# Patient Record
Sex: Female | Born: 1950
Health system: Southern US, Community
[De-identification: ages and names within clinical notes are randomized; demographics above are authoritative.]

## PROBLEM LIST (undated history)

## (undated) DIAGNOSIS — D649 Anemia, unspecified: Secondary | ICD-10-CM

## (undated) DIAGNOSIS — F329 Major depressive disorder, single episode, unspecified: Secondary | ICD-10-CM

## (undated) DIAGNOSIS — F32A Depression, unspecified: Secondary | ICD-10-CM

## (undated) DIAGNOSIS — F419 Anxiety disorder, unspecified: Secondary | ICD-10-CM

## (undated) DIAGNOSIS — Z9981 Dependence on supplemental oxygen: Secondary | ICD-10-CM

## (undated) DIAGNOSIS — K2270 Barrett's esophagus without dysplasia: Secondary | ICD-10-CM

## (undated) DIAGNOSIS — J939 Pneumothorax, unspecified: Secondary | ICD-10-CM

## (undated) DIAGNOSIS — K449 Diaphragmatic hernia without obstruction or gangrene: Secondary | ICD-10-CM

## (undated) DIAGNOSIS — K589 Irritable bowel syndrome without diarrhea: Secondary | ICD-10-CM

## (undated) DIAGNOSIS — Z972 Presence of dental prosthetic device (complete) (partial): Secondary | ICD-10-CM

## (undated) DIAGNOSIS — G43909 Migraine, unspecified, not intractable, without status migrainosus: Secondary | ICD-10-CM

## (undated) DIAGNOSIS — D860 Sarcoidosis of lung: Secondary | ICD-10-CM

## (undated) DIAGNOSIS — K219 Gastro-esophageal reflux disease without esophagitis: Secondary | ICD-10-CM

## (undated) HISTORY — DX: Irritable bowel syndrome, unspecified: K58.9

## (undated) HISTORY — PX: CATARACT EXTRACTION, BILATERAL: SHX1313

## (undated) HISTORY — PX: ROTATOR CUFF REPAIR: SHX139

## (undated) HISTORY — DX: Sarcoidosis of lung: D86.0

## (undated) HISTORY — DX: Diaphragmatic hernia without obstruction or gangrene: K44.9

## (undated) HISTORY — PX: CHOLECYSTECTOMY: SHX55

## (undated) HISTORY — DX: Anxiety disorder, unspecified: F41.9

## (undated) HISTORY — PX: APPENDECTOMY: SHX54

## (undated) HISTORY — PX: NISSEN FUNDOPLICATION: SHX2091

## (undated) HISTORY — DX: Migraine, unspecified, not intractable, without status migrainosus: G43.909

## (undated) HISTORY — DX: Gastro-esophageal reflux disease without esophagitis: K21.9

## (undated) HISTORY — DX: Barrett's esophagus without dysplasia: K22.70

---

## 1994-09-08 HISTORY — PX: BRONCHIAL BIOPSY: SHX5109

## 2000-04-30 ENCOUNTER — Other Ambulatory Visit: Admission: RE | Admit: 2000-04-30 | Discharge: 2000-04-30 | Payer: Self-pay | Admitting: Gynecology

## 2003-02-27 ENCOUNTER — Ambulatory Visit (HOSPITAL_COMMUNITY): Admission: RE | Admit: 2003-02-27 | Discharge: 2003-02-27 | Payer: Self-pay | Admitting: Obstetrics

## 2003-02-27 ENCOUNTER — Encounter: Payer: Self-pay | Admitting: Obstetrics

## 2004-05-31 ENCOUNTER — Ambulatory Visit (HOSPITAL_COMMUNITY): Admission: RE | Admit: 2004-05-31 | Discharge: 2004-05-31 | Payer: Self-pay | Admitting: Internal Medicine

## 2004-10-29 ENCOUNTER — Ambulatory Visit: Payer: Self-pay | Admitting: Internal Medicine

## 2004-11-06 ENCOUNTER — Ambulatory Visit: Payer: Self-pay | Admitting: Internal Medicine

## 2004-11-07 ENCOUNTER — Ambulatory Visit (HOSPITAL_COMMUNITY): Admission: RE | Admit: 2004-11-07 | Discharge: 2004-11-07 | Payer: Self-pay | Admitting: Obstetrics

## 2004-11-20 ENCOUNTER — Ambulatory Visit: Payer: Self-pay | Admitting: Internal Medicine

## 2004-11-29 ENCOUNTER — Ambulatory Visit: Payer: Self-pay | Admitting: Internal Medicine

## 2005-01-28 ENCOUNTER — Ambulatory Visit: Payer: Self-pay | Admitting: Internal Medicine

## 2005-02-05 ENCOUNTER — Ambulatory Visit: Payer: Self-pay | Admitting: Internal Medicine

## 2005-02-05 ENCOUNTER — Ambulatory Visit (HOSPITAL_COMMUNITY): Admission: RE | Admit: 2005-02-05 | Discharge: 2005-02-05 | Payer: Self-pay | Admitting: Internal Medicine

## 2005-02-10 ENCOUNTER — Ambulatory Visit (HOSPITAL_COMMUNITY): Admission: RE | Admit: 2005-02-10 | Discharge: 2005-02-10 | Payer: Self-pay | Admitting: Internal Medicine

## 2005-02-10 ENCOUNTER — Encounter: Payer: Self-pay | Admitting: Internal Medicine

## 2005-02-17 ENCOUNTER — Ambulatory Visit: Payer: Self-pay | Admitting: Internal Medicine

## 2005-04-30 ENCOUNTER — Encounter (INDEPENDENT_AMBULATORY_CARE_PROVIDER_SITE_OTHER): Payer: Self-pay | Admitting: *Deleted

## 2005-05-01 ENCOUNTER — Inpatient Hospital Stay (HOSPITAL_COMMUNITY): Admission: RE | Admit: 2005-05-01 | Discharge: 2005-05-03 | Payer: Self-pay | Admitting: Surgery

## 2005-05-01 ENCOUNTER — Encounter (INDEPENDENT_AMBULATORY_CARE_PROVIDER_SITE_OTHER): Payer: Self-pay | Admitting: *Deleted

## 2005-06-04 ENCOUNTER — Ambulatory Visit: Payer: Self-pay | Admitting: Internal Medicine

## 2005-06-15 ENCOUNTER — Encounter: Admission: RE | Admit: 2005-06-15 | Discharge: 2005-06-15 | Payer: Self-pay | Admitting: Internal Medicine

## 2005-07-15 ENCOUNTER — Ambulatory Visit (HOSPITAL_COMMUNITY): Admission: RE | Admit: 2005-07-15 | Discharge: 2005-07-16 | Payer: Self-pay | Admitting: Orthopedic Surgery

## 2005-11-18 ENCOUNTER — Ambulatory Visit: Payer: Self-pay | Admitting: Internal Medicine

## 2006-07-08 ENCOUNTER — Ambulatory Visit: Payer: Self-pay | Admitting: Internal Medicine

## 2006-08-04 ENCOUNTER — Ambulatory Visit: Payer: Self-pay | Admitting: Internal Medicine

## 2007-05-03 ENCOUNTER — Ambulatory Visit: Payer: Self-pay | Admitting: Internal Medicine

## 2007-05-04 ENCOUNTER — Encounter: Payer: Self-pay | Admitting: Internal Medicine

## 2007-05-04 LAB — CONVERTED CEMR LAB: Angiotensin 1 Converting Enzyme: 95 units/L — ABNORMAL HIGH (ref 9–67)

## 2007-08-11 ENCOUNTER — Ambulatory Visit: Payer: Self-pay | Admitting: Internal Medicine

## 2007-08-12 ENCOUNTER — Encounter: Payer: Self-pay | Admitting: Internal Medicine

## 2007-08-12 DIAGNOSIS — J4530 Mild persistent asthma, uncomplicated: Secondary | ICD-10-CM | POA: Insufficient documentation

## 2007-08-12 DIAGNOSIS — D869 Sarcoidosis, unspecified: Secondary | ICD-10-CM | POA: Insufficient documentation

## 2007-08-12 DIAGNOSIS — G43909 Migraine, unspecified, not intractable, without status migrainosus: Secondary | ICD-10-CM | POA: Insufficient documentation

## 2007-08-12 DIAGNOSIS — K449 Diaphragmatic hernia without obstruction or gangrene: Secondary | ICD-10-CM | POA: Insufficient documentation

## 2007-09-27 ENCOUNTER — Telehealth (INDEPENDENT_AMBULATORY_CARE_PROVIDER_SITE_OTHER): Payer: Self-pay | Admitting: *Deleted

## 2008-01-07 ENCOUNTER — Telehealth: Payer: Self-pay | Admitting: Internal Medicine

## 2008-01-10 ENCOUNTER — Encounter: Payer: Self-pay | Admitting: Internal Medicine

## 2008-01-10 ENCOUNTER — Ambulatory Visit (HOSPITAL_COMMUNITY): Admission: RE | Admit: 2008-01-10 | Discharge: 2008-01-10 | Payer: Self-pay | Admitting: Internal Medicine

## 2008-01-27 ENCOUNTER — Ambulatory Visit: Payer: Self-pay | Admitting: Internal Medicine

## 2008-08-10 ENCOUNTER — Ambulatory Visit: Payer: Self-pay | Admitting: Internal Medicine

## 2008-08-29 ENCOUNTER — Ambulatory Visit (HOSPITAL_COMMUNITY): Admission: RE | Admit: 2008-08-29 | Discharge: 2008-08-29 | Payer: Self-pay | Admitting: Obstetrics

## 2008-08-29 ENCOUNTER — Ambulatory Visit: Payer: Self-pay | Admitting: Internal Medicine

## 2008-08-29 DIAGNOSIS — Z86010 Personal history of colonic polyps: Secondary | ICD-10-CM | POA: Insufficient documentation

## 2008-08-29 DIAGNOSIS — F411 Generalized anxiety disorder: Secondary | ICD-10-CM | POA: Insufficient documentation

## 2008-08-30 ENCOUNTER — Telehealth (INDEPENDENT_AMBULATORY_CARE_PROVIDER_SITE_OTHER): Payer: Self-pay | Admitting: *Deleted

## 2008-09-13 ENCOUNTER — Telehealth (INDEPENDENT_AMBULATORY_CARE_PROVIDER_SITE_OTHER): Payer: Self-pay | Admitting: *Deleted

## 2008-09-14 ENCOUNTER — Ambulatory Visit: Payer: Self-pay | Admitting: Internal Medicine

## 2008-09-14 ENCOUNTER — Encounter (INDEPENDENT_AMBULATORY_CARE_PROVIDER_SITE_OTHER): Payer: Self-pay | Admitting: *Deleted

## 2008-09-14 LAB — CONVERTED CEMR LAB
OCCULT 1: NEGATIVE
OCCULT 2: NEGATIVE
OCCULT 3: NEGATIVE

## 2008-10-20 ENCOUNTER — Telehealth (INDEPENDENT_AMBULATORY_CARE_PROVIDER_SITE_OTHER): Payer: Self-pay | Admitting: *Deleted

## 2008-10-30 ENCOUNTER — Telehealth (INDEPENDENT_AMBULATORY_CARE_PROVIDER_SITE_OTHER): Payer: Self-pay | Admitting: *Deleted

## 2008-11-21 ENCOUNTER — Ambulatory Visit (HOSPITAL_BASED_OUTPATIENT_CLINIC_OR_DEPARTMENT_OTHER): Admission: RE | Admit: 2008-11-21 | Discharge: 2008-11-22 | Payer: Self-pay | Admitting: Orthopedic Surgery

## 2008-11-28 ENCOUNTER — Emergency Department (HOSPITAL_COMMUNITY): Admission: EM | Admit: 2008-11-28 | Discharge: 2008-11-28 | Payer: Self-pay | Admitting: Emergency Medicine

## 2009-04-04 ENCOUNTER — Encounter: Payer: Self-pay | Admitting: Internal Medicine

## 2009-06-25 ENCOUNTER — Encounter: Payer: Self-pay | Admitting: Internal Medicine

## 2009-07-11 ENCOUNTER — Telehealth (INDEPENDENT_AMBULATORY_CARE_PROVIDER_SITE_OTHER): Payer: Self-pay | Admitting: *Deleted

## 2009-07-13 ENCOUNTER — Encounter (INDEPENDENT_AMBULATORY_CARE_PROVIDER_SITE_OTHER): Payer: Self-pay | Admitting: *Deleted

## 2009-07-13 ENCOUNTER — Ambulatory Visit: Payer: Self-pay | Admitting: Internal Medicine

## 2009-08-10 ENCOUNTER — Encounter: Admission: RE | Admit: 2009-08-10 | Discharge: 2009-08-10 | Payer: Self-pay | Admitting: Orthopedic Surgery

## 2009-11-02 ENCOUNTER — Ambulatory Visit: Payer: Self-pay | Admitting: Internal Medicine

## 2009-11-02 DIAGNOSIS — R05 Cough: Secondary | ICD-10-CM | POA: Insufficient documentation

## 2009-11-02 DIAGNOSIS — R058 Other specified cough: Secondary | ICD-10-CM | POA: Insufficient documentation

## 2009-12-14 ENCOUNTER — Encounter: Payer: Self-pay | Admitting: Internal Medicine

## 2009-12-14 ENCOUNTER — Ambulatory Visit: Payer: Self-pay | Admitting: Internal Medicine

## 2009-12-14 ENCOUNTER — Ambulatory Visit: Payer: Self-pay | Admitting: Cardiology

## 2009-12-19 ENCOUNTER — Encounter (INDEPENDENT_AMBULATORY_CARE_PROVIDER_SITE_OTHER): Payer: Self-pay | Admitting: *Deleted

## 2009-12-26 ENCOUNTER — Encounter (INDEPENDENT_AMBULATORY_CARE_PROVIDER_SITE_OTHER): Payer: Self-pay | Admitting: *Deleted

## 2010-02-12 DIAGNOSIS — K2270 Barrett's esophagus without dysplasia: Secondary | ICD-10-CM | POA: Insufficient documentation

## 2010-02-18 ENCOUNTER — Ambulatory Visit: Payer: Self-pay | Admitting: Internal Medicine

## 2010-02-18 DIAGNOSIS — K219 Gastro-esophageal reflux disease without esophagitis: Secondary | ICD-10-CM | POA: Insufficient documentation

## 2010-03-13 ENCOUNTER — Ambulatory Visit: Payer: Self-pay | Admitting: Internal Medicine

## 2010-03-15 ENCOUNTER — Encounter: Payer: Self-pay | Admitting: Internal Medicine

## 2010-03-21 ENCOUNTER — Ambulatory Visit (HOSPITAL_COMMUNITY): Admission: RE | Admit: 2010-03-21 | Discharge: 2010-03-21 | Payer: Self-pay | Admitting: Orthopedic Surgery

## 2010-03-26 ENCOUNTER — Ambulatory Visit (HOSPITAL_BASED_OUTPATIENT_CLINIC_OR_DEPARTMENT_OTHER): Admission: RE | Admit: 2010-03-26 | Discharge: 2010-03-27 | Payer: Self-pay | Admitting: Orthopedic Surgery

## 2010-04-30 ENCOUNTER — Ambulatory Visit: Payer: Self-pay | Admitting: Internal Medicine

## 2010-05-30 ENCOUNTER — Encounter: Payer: Self-pay | Admitting: Internal Medicine

## 2010-06-18 ENCOUNTER — Telehealth: Payer: Self-pay | Admitting: Internal Medicine

## 2010-06-18 ENCOUNTER — Telehealth (INDEPENDENT_AMBULATORY_CARE_PROVIDER_SITE_OTHER): Payer: Self-pay | Admitting: *Deleted

## 2010-06-20 ENCOUNTER — Ambulatory Visit: Payer: Self-pay | Admitting: Internal Medicine

## 2010-06-20 ENCOUNTER — Telehealth (INDEPENDENT_AMBULATORY_CARE_PROVIDER_SITE_OTHER): Payer: Self-pay | Admitting: *Deleted

## 2010-09-05 ENCOUNTER — Ambulatory Visit: Payer: Self-pay | Admitting: Internal Medicine

## 2010-09-05 ENCOUNTER — Ambulatory Visit (HOSPITAL_COMMUNITY)
Admission: RE | Admit: 2010-09-05 | Discharge: 2010-09-05 | Payer: Self-pay | Source: Home / Self Care | Attending: Obstetrics | Admitting: Obstetrics

## 2010-09-05 DIAGNOSIS — J309 Allergic rhinitis, unspecified: Secondary | ICD-10-CM | POA: Insufficient documentation

## 2010-09-05 DIAGNOSIS — K589 Irritable bowel syndrome without diarrhea: Secondary | ICD-10-CM | POA: Insufficient documentation

## 2010-09-10 LAB — CONVERTED CEMR LAB
ALT: 21 units/L (ref 0–35)
AST: 11 units/L (ref 0–37)
Albumin: 4 g/dL (ref 3.5–5.2)
Alkaline Phosphatase: 64 units/L (ref 39–117)
BUN: 12 mg/dL (ref 6–23)
Basophils Absolute: 0 10*3/uL (ref 0.0–0.1)
Basophils Relative: 0.5 % (ref 0.0–3.0)
Bilirubin, Direct: 0 mg/dL (ref 0.0–0.3)
CO2: 29 meq/L (ref 19–32)
Calcium: 9.4 mg/dL (ref 8.4–10.5)
Chloride: 105 meq/L (ref 96–112)
Cholesterol: 166 mg/dL (ref 0–200)
Creatinine, Ser: 1 mg/dL (ref 0.4–1.2)
Eosinophils Absolute: 0.3 10*3/uL (ref 0.0–0.7)
Eosinophils Relative: 4.8 % (ref 0.0–5.0)
GFR calc non Af Amer: 70.63 mL/min (ref >60.00)
Glucose, Bld: 85 mg/dL (ref 70–99)
HCT: 37.9 % (ref 36.0–46.0)
HDL: 54 mg/dL (ref >39.00)
Hemoglobin: 12.6 g/dL (ref 12.0–15.0)
LDL Cholesterol: 93 mg/dL (ref 0–99)
Lymphocytes Relative: 37.7 % (ref 12.0–46.0)
Lymphs Abs: 2.1 10*3/uL (ref 0.7–4.0)
MCHC: 33.3 g/dL (ref 30.0–36.0)
MCV: 91.5 fL (ref 78.0–100.0)
Monocytes Absolute: 1 10*3/uL (ref 0.1–1.0)
Monocytes Relative: 18 % — ABNORMAL HIGH (ref 3.0–12.0)
Neutro Abs: 2.1 10*3/uL (ref 1.4–7.7)
Neutrophils Relative %: 39 % — ABNORMAL LOW (ref 43.0–77.0)
Platelets: 209 10*3/uL (ref 150.0–400.0)
Potassium: 4.1 meq/L (ref 3.5–5.1)
RBC: 4.14 M/uL (ref 3.87–5.11)
RDW: 13.4 % (ref 11.5–14.6)
Sodium: 141 meq/L (ref 135–145)
TSH: 1.83 microintl units/mL (ref 0.35–5.50)
Total Bilirubin: 0.6 mg/dL (ref 0.3–1.2)
Total CHOL/HDL Ratio: 3
Total Protein: 7.5 g/dL (ref 6.0–8.3)
Triglycerides: 94 mg/dL (ref 0.0–149.0)
VLDL: 18.8 mg/dL (ref 0.0–40.0)
WBC: 5.4 10*3/uL (ref 4.5–10.5)

## 2010-10-06 LAB — CONVERTED CEMR LAB
ALT: 43 units/L — ABNORMAL HIGH (ref 0–35)
AST: 48 units/L — ABNORMAL HIGH (ref 0–37)
Albumin: 4 g/dL (ref 3.5–5.2)
Alkaline Phosphatase: 64 units/L (ref 39–117)
BUN: 10 mg/dL (ref 6–23)
Basophils Absolute: 0 10*3/uL (ref 0.0–0.1)
Basophils Relative: 0.8 % (ref 0.0–3.0)
Bilirubin, Direct: 0.1 mg/dL (ref 0.0–0.3)
CO2: 31 meq/L (ref 19–32)
Calcium: 9.9 mg/dL (ref 8.4–10.5)
Chloride: 102 meq/L (ref 96–112)
Cholesterol: 166 mg/dL (ref 0–200)
Creatinine, Ser: 1 mg/dL (ref 0.4–1.2)
Eosinophils Absolute: 0.2 10*3/uL (ref 0.0–0.7)
Eosinophils Relative: 3.2 % (ref 0.0–5.0)
GFR calc Af Amer: 73 mL/min
GFR calc non Af Amer: 60 mL/min
Glucose, Bld: 93 mg/dL (ref 70–99)
HCT: 39.1 % (ref 36.0–46.0)
HDL: 60.7 mg/dL (ref >39.0)
Hemoglobin: 13.5 g/dL (ref 12.0–15.0)
LDL Cholesterol: 93 mg/dL (ref 0–99)
Lymphocytes Relative: 32.3 % (ref 12.0–46.0)
MCHC: 34.5 g/dL (ref 30.0–36.0)
MCV: 92 fL (ref 78.0–100.0)
Monocytes Absolute: 0.8 10*3/uL (ref 0.1–1.0)
Monocytes Relative: 16.1 % — ABNORMAL HIGH (ref 3.0–12.0)
Neutro Abs: 2.5 10*3/uL (ref 1.4–7.7)
Neutrophils Relative %: 47.6 % (ref 43.0–77.0)
Platelets: 218 10*3/uL (ref 150–400)
Potassium: 3.8 meq/L (ref 3.5–5.1)
RBC: 4.25 M/uL (ref 3.87–5.11)
RDW: 12.9 % (ref 11.5–14.6)
Sodium: 141 meq/L (ref 135–145)
TSH: 1.76 microintl units/mL (ref 0.35–5.50)
Total Bilirubin: 0.5 mg/dL (ref 0.3–1.2)
Total CHOL/HDL Ratio: 2.7
Total Protein: 8.3 g/dL (ref 6.0–8.3)
Triglycerides: 62 mg/dL (ref 0–149)
VLDL: 12 mg/dL (ref 0–40)
WBC: 5.1 10*3/uL (ref 4.5–10.5)

## 2010-10-08 NOTE — Letter (Signed)
Summary: EGD Instructions  [facility]  [address] [place], [place] [address]   Phone: [phone]  Fax: [phone]       [name]    May 06, 1951    MRN: [id number]       Procedure Day Dorna Bloom: Wednesday 03/13/10     Arrival Time: 1:30 pm     Procedure Time: 2:30 pm     Location of Procedure:                    _ x _ [facility] (4th Floor)  PREPARATION FOR ENDOSCOPY   On 03/13/10 THE DAY OF THE PROCEDURE:  1.   No solid foods, milk or milk products are allowed after midnight the night before your procedure.  2.   Do not drink anything colored red or purple.  Avoid juices with pulp.  No orange juice.  3.  You may drink clear liquids until 12:30 pm, which is 2 hours before your procedure.                                                                                                CLEAR LIQUIDS INCLUDE: Water Jello Ice Popsicles Tea (sugar ok, no milk/cream) Powdered fruit flavored drinks Coffee (sugar ok, no milk/cream) Gatorade Juice: apple, white grape, white cranberry  Lemonade Clear bullion, consomm, broth Carbonated beverages (any kind) Strained chicken noodle soup Hard Candy   MEDICATION INSTRUCTIONS  Unless otherwise instructed, you should take regular prescription medications with a small sip of water as early as possible the morning of your procedure.                  OTHER INSTRUCTIONS  You will need a responsible adult at least 62 years of age to accompany you and drive you home.   This person must remain in the waiting room during your procedure.  Wear loose fitting clothing that is easily removed.  Leave jewelry and other valuables at home.  However, you may wish to bring a book to read or an iPod/MP3 player to listen to music as you wait for your procedure to start.  Remove all body piercing jewelry and leave at home.  Total time from sign-in until discharge is approximately 2-3 hours.  You should  go home directly after your procedure and rest.  You can resume normal activities the day after your procedure.  The day of your procedure you should not:   Drive   Make legal decisions   Operate machinery   Drink alcohol   Return to work  You will receive specific instructions about eating, activities and medications before you leave.    The above instructions have been reviewed and explained to me by   Lamona Curl CMA Duncan Dull)  February 18, 2010 2:09 PM     I fully understand and can verbalize these instructions _____________________________ Date 02/18/10

## 2010-10-08 NOTE — Progress Notes (Signed)
Summary: stomach ache  Phone Note Call from Patient Call back at Home Phone [phone] Call back at [phone]   Caller: Patient Call For: wert Reason for Call: Talk to Nurse Summary of Call: Pt states it hurts around her stomach area when she coughs, feels like something is broken, would like to come in for an xray, pls advise. Initial call taken by: Darletta Moll,  June 18, 2010 8:38 AM  Follow-up for Phone Call        called and spoke with pt.  pt states she has noticed stomach pains "up under her breast" since Saturday.  Pt states she only notices the pain when she coughs.  Pt states she also had a fever on Saturday night.  I offered pt an appt with TP out in [facility] this morning.  ([place]  Pt stated she wanted to "think about it" and will call us back if she would like to schedule ov.  will await for pt to call back.  Aundra Millet Reynolds LPN  June 18, 2010 9:10 AM

## 2010-10-08 NOTE — Letter (Signed)
Summary: New Patient letter  [facility]  [address] [place], [place] [address]   Phone: [phone]  Fax: [phone]       12/26/2009 MRN: [id number]  [name] [address] [place], [place]  [address]  Dear [name],  Welcome to the Gastroenterology Division at [facility].    You are scheduled to see Dr. Juanda Chance on 02-18-10 at 1:30p.m. on the 3rd floor at [facility], [address]  We ask that you try to arrive at our office 15 minutes prior to your appointment time to allow for check-in.  We would like you to complete the enclosed self-administered evaluation form prior to your visit and bring it with you on the day of your appointment.  We will review it with you.  Also, please bring a complete list of all your medications or, if you prefer, bring the medication bottles and we will list them.  Please bring your insurance card so that we may make a copy of it.  If your insurance requires a referral to see a specialist, please bring your referral form from your primary care physician.  Co-payments are due at the time of your visit and may be paid by cash, check or credit card.     Your office visit will consist of a consult with your physician (includes a physical exam), any laboratory testing he/she may order, scheduling of any necessary diagnostic testing (e.g. x-ray, ultrasound, CT-scan), and scheduling of a procedure (e.g. Endoscopy, Colonoscopy) if required.  Please allow enough time on your schedule to allow for any/all of these possibilities.    If you cannot keep your appointment, please call [phone] to cancel or reschedule prior to your appointment date.  This allows [place] the opportunity to schedule an appointment for another patient in need of care.  If you do not cancel or reschedule by 5 p.m. the business day prior to your appointment date, you will be charged a $50.00 late cancellation/no-show fee.    Thank you for choosing  [facility] for your medical needs.  We appreciate the opportunity to care for you.  Please visit [place] at our website  to learn more about our practice.                     Sincerely,                                                             The Gastroenterology Division

## 2010-10-08 NOTE — Assessment & Plan Note (Signed)
Summary: pain under R breast/worse when coughing/mg   Visit Type:  NP visit Copy to:  na Primary Madiline Saffran/Referring Enriqueta Augusta:  Marga Melnick, MD   CC:  Pt c/o sharp right side pain to touch, when twisting at waist, sneezing, and coughing or with bowel movements .  History of Present Illness: 20 yobf quit smoking 1983 with sarcoidosis dx in 1995.  November 02, 2009  c/o productive cough   x 6 months with small amounts of yellow mucus, esp in am Temp range T-97.6 to 99.4 since 10/29/09, chills, chest congestion1)  We called in Prednisone x 6 days, doxy for 7 days and tramadol for excess cough despite advair and ventolin being added as new maint rx.  Avelox 400 mg one daily x 10 days keep taking delsym and mucinex stop advair  Prednisone  4 each am x 2days, 2x2days, 1x2days and stop  take nexium 30 min before fist  meal and pepcid 20 mg (otc) at bedtime plus diet measures as listed.   December 14, 2009 Followup with PFT's.  Pt states that her cough is some better since last seen.  She stats that in the am prod is prod with yellow sputum.  Today she c/o "sinus HA" on and off x several wks.  She states her breathing is the same- no better or worse. overall no worse off advair, no increase need for rescue.   April 30, 2010 hoarseness started  8/20  worsening sob on exertion - prod cough (yellow) - had rotator cuff surgery 03-26-10 coughing since then, did not activate previous plan to add pepcid at hs.    June 20, 2010--Presents for an acute office visit. Complains of Pt c/o sharp right side pain to touch, when twisting at waist, sneezing, coughing , deep breaths or with bowel movements, worse at night starting 5 days ago.. She is flying out to [place] in am for funeral of aunt. No cough, dyspnea, rash, abdominal pain, n/v/d. Denies  orthopnea, hemoptysis, fever, n/v/d, edema, headache.   Preventive Screening-Counseling & Management  Alcohol-Tobacco     Smoking Status: quit     Packs/Day:  2.0     Year Started: 1967     Year Quit: 1983  Medications Prior to Update: 1)  Nexium 40 Mg  Cpdr (Esomeprazole Magnesium) .... By Mouth Daily. Take One Half Hour Before Eating. 2)  Citalopram Hydrobromide 20 Mg Tabs (Citalopram Hydrobromide) .... Take 1 Tablet By Mouth Once A Day 3)  Pepcid 20 Mg Tabs (Famotidine) .... One At [facility] 4)  Fluticasone Propionate 50 Mcg/act Susp (Fluticasone Propionate) .... One Spray Each Nostril Daily 5)  Levsin/sl 0.125 Mg Subl (Hyoscyamine Sulfate) .... Dissolve 1 Tablet Under Tongue As Neede For Rectal Pain 6)  Ventolin Hfa 108 (90 Base) Mcg/act Aers (Albuterol Sulfate) .Marland Kitchen.. 1-2 Puffs Every 4-6 Hrs As Needed 7)  Delsym 30 Mg/58ml Lqcr (Dextromethorphan Polistirex) .... Once Daily As Needed 8)  Methocarbamol 500 Mg Tabs (Methocarbamol) .... Take One Every 6 Hrs As Needed Muscle Spasm 9)  Prednisone 10 Mg  Tabs (Prednisone) .... 4 Each Am X 2days, 2x2days, 1x2days and Stop 10)  Tramadol Hcl 50 Mg  Tabs (Tramadol Hcl) .... One To Two By Mouth Every 4-6 Hours  Current Medications (verified): 1)  Ventolin Hfa 108 (90 Base) Mcg/act Aers (Albuterol Sulfate) .Marland Kitchen.. 1-2 Puffs Every 4-6 Hrs As Needed 2)  Delsym 30 Mg/69ml Lqcr (Dextromethorphan Polistirex) .... Once Daily As Needed 3)  Fluticasone Propionate 50 Mcg/act Susp (Fluticasone Propionate) .Marland KitchenMarland KitchenMarland Kitchen  One Spray Each Nostril Daily As Needed 4)  Nexium 40 Mg  Cpdr (Esomeprazole Magnesium) .... By Mouth Daily. Take One Half Hour Before Eating. 5)  Pepcid 20 Mg Tabs (Famotidine) .... One At [facility] 6)  Levsin/sl 0.125 Mg Subl (Hyoscyamine Sulfate) .... Dissolve 1 Tablet Under Tongue As Neede For Rectal Pain 7)  Celebrex 200 Mg Caps (Celecoxib) .... Take 1 Tablet By Mouth Once A Day  Allergies (verified): 1)  ! Pcn 2)  ! Jonne Ply  Past History:  Past Medical History: Last updated: 04/30/2010 PULMONARY SARCOIDOSIS (ICD-135)      - dx [address] [place], last prednisone dep around 2005      - PFT's  December 14, 2009 FEV1 1.99 (86%) ratio 77 with no better after B2 and DLC0 44 > corrects to 83 HIATAL HERNIA (ICD-553.3)/GERD      - EGD pos for reflux injury by bx but neg Barretts 03/13/10 ASTHMA (ICD-493.90) MIGRAINES, HX OF (ICD-V13.8) ? Sinus HA    - Sinus CT neg December 14, 2009  DUD  Colonic polyps, hx of Polyclonal IgG elevation Health Maintenance.......................................................................Marland KitchenHopper     - Pneumovax  December 14, 2009   Past Surgical History: Last updated: 02/12/2010 CHOLECYSTECTOMY G2 P2 Colon polypectomy 2006, Dr Juanda Chance Rotator cuff repair Cataract extraction bilat Trans bronchial biopsy 1996 Nissen Fundoplication  Family History: Last updated: 02/18/2010 Patient adopted  neg resp dz or sarcoidosis  & aunt ?HTN;  uncle & aunt with DM No FH of Colon Cancer:  Social History: Last updated: 02/18/2010 Former Smoker.  Quit 1980.  Smoked for 20 years at 1 ppd. 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 No Pain Assessment Patient in pain? yes     Location: abdomen Intensity: 8 Type: sharp Onset of pain  Intermittent Comments Medications reviewed with patient Verified contact number and pharmacy with patient Zackery Barefoot CMA  June 20, 2010 2:11 PM    Physical Exam  Additional Exam:  amb bf nad with voice fatigue and mild pseudowheeze  resolves with purse lip maneuver  wt   184 August 10, 2008 > 192 November 02, 2009 > 191 December 14, 2009 > 203 April 30, 2010 >>203 June 20, 2010   HEENT: nl dentition, turbinates, and orophanx. Nl external ear canals without cough reflex Neck without JVD/Nodes/TM Lungs clear to A and P bilaterally without cough on insp or exp maneuvers, tender along right lateral/lower ribs no eccymois noted , no deformitty or rash noted.  RRR no s3 or murmur or increase in P2 Abd soft and benign with nl excursion in the supine position. No bruits or organomegaly Ext warm without calf tenderness, cyanosis clubbing or  edema Skin warm and dry without lesions     Impression & Recommendations:  Problem # 1:  RIB PAIN, RIGHT SIDED (ICD-786.50)  Pleurtic type pain. XRay recently w/ no acute changes.  will tx w/ NSAIDS and heat  Plan:  May use celebrex once daily for rib /joint pain.  Warm heat to ribs two times a day  Please contact office for sooner follow up if symptoms do not improve or worsen  Flu shot today.   Orders: Est. Patient Level III (04540)  Medications Added to Medication List This Visit: 1)  Celebrex 200 Mg Caps (Celecoxib) .... Take 1 tablet by mouth once a day 2)  Fluticasone Propionate 50 Mcg/act Susp (Fluticasone propionate) .... One spray each nostril daily as needed  Complete Medication List: 1)  Nexium 40 Mg Cpdr (Esomeprazole magnesium) .... By mouth daily. take one half hour before eating. 2)  Pepcid 20 Mg Tabs (Famotidine) .... One at beditme 3)  Celebrex 200 Mg Caps (Celecoxib) .... Take 1 tablet by mouth once a day 4)  Levsin/sl 0.125 Mg Subl (Hyoscyamine sulfate) .... Dissolve 1 tablet under tongue as neede for rectal pain 5)  Ventolin Hfa 108 (90 Base) Mcg/act Aers (Albuterol sulfate) .Marland Kitchen.. 1-2 puffs every 4-6 hrs as needed 6)  Delsym 30 Mg/52ml Lqcr (Dextromethorphan polistirex) .... Once daily as needed 7)  Fluticasone Propionate 50 Mcg/act Susp (Fluticasone  propionate) .... One spray each nostril daily as needed  Patient Instructions: 1)  May use celebrex once daily for rib /joint pain.  2)  Warm heat to ribs two times a day  3)  Please contact office for sooner follow up if symptoms do not improve or worsen  4)  Flu shot today.     Immunization History:  Pneumovax Immunization History:    Pneumovax:  historical (12/14/2009)   Appended Document: Orders Update    Clinical Lists Changes  Orders: Added new Service order of Admin 1st Vaccine ([id number]) - Signed Added new Service order of Flu Vaccine 66yrs + [phone]) - Signed Observations: Added new observation of FLU VAX VIS: 04/02/10 version (06/20/2010 15:07) Added new observation of FLU VAXLOT: [id number] (06/20/2010 15:07) Added new observation of FLU VAXMFR: [employer] (06/20/2010 15:07) Added new observation of FLU VAX EXP: 03/08/2011 (06/20/2010 15:07) Added new observation of FLU VAX DSE: 0.67ml (06/20/2010 15:07) Added new observation of FLU VAX: Fluvax 3+ (06/20/2010 15:07)Flu Vaccine Consent Questions     Do you have a history of severe allergic reactions to this vaccine? no    Any prior history of allergic reactions to egg and/or gelatin? no    Do you have a sensitivity to the preservative Thimersol? no    Do you have a past history of Guillan-Barre Syndrome? no    Do you currently have an acute febrile illness? no    Have you ever had a severe reaction to latex? no    Vaccine information given and explained to patient? yes    Are you currently pregnant? no    Lot Number:[id number]   Exp Date:03/08/2011   Site Given  Left Deltoid IM Zackery Barefoot CMA  June 20, 2010 3:08 PM  vation of FLU VAX EXP: 03/08/2011 (06/20/2010 15:07) Added new observation of FLU VAX DSE: 0.74ml (06/20/2010 15:07) Added new observation of FLU VAX: Fluvax 3+ (06/20/2010 15:07)     .lbflu

## 2010-10-08 NOTE — Assessment & Plan Note (Signed)
Summary: discuss egd--ch.   History of Present Illness Visit Type: new patient  Primary GI MD: Lina Sar MD Primary Provider: Marga Melnick, MD  Requesting Provider: na Chief Complaint: Consult EGD. Pt c/o lower abd pain, rectal pain, bloating, and GERD. History of Present Illness:   This is a 60 year old African American female with Barrett's esophagus diagnosed on an upper endoscopy in May 2009. She has gastroesophageal reflux and is status post Nissen fundoplication in 2006. She had a large hiatal hernia which since then has been reduced by surgery. She has had intermittent dysphagia. A barium esophagram prior to the surgery showed retrograde contractions and a few tertiary contractions. She has seen by Dr.Wert for nasal and throat congestion and was told that it may be her gastroesophageal reflux. She is  on Nexium 40 mg daily with improvement of her symptoms. She is due for a followup upper endoscopy. Another problem has been rectal pain which occurs at night and lasts for several hours. It may wake her up at night and is usually relieved by evacuation of the stool or by passing flatus. She also has trouble with nocturia and urinary stress incontinence.   GI Review of Systems    Reports abdominal pain, acid reflux, bloating, and  heartburn.     Location of  Abdominal pain: lower abdomen.    Denies belching, chest pain, dysphagia with liquids, dysphagia with solids, loss of appetite, nausea, vomiting, vomiting blood, weight loss, and  weight gain.      Reports rectal pain.     Denies anal fissure, black tarry stools, change in bowel habit, constipation, diarrhea, diverticulosis, fecal incontinence, heme positive stool, hemorrhoids, irritable bowel syndrome, jaundice, light color stool, liver problems, and  rectal bleeding.    Current Medications (verified): 1)  Ventolin Hfa 108 (90 Base) Mcg/act Aers (Albuterol Sulfate) .Marland Kitchen.. 1-2 Puffs Every 4-6 Hrs As Needed 2)  Citalopram Hydrobromide  20 Mg Tabs (Citalopram Hydrobromide) .... Take 1 Tablet By Mouth Once A Day 3)  Delsym 30 Mg/1ml Lqcr (Dextromethorphan Polistirex) .... Once Daily 4)  Fluticasone Propionate 50 Mcg/act Susp (Fluticasone Propionate) .... One Spray Each Nostril Daily 5)  Mucinex 600 Mg Xr12h-Tab (Guaifenesin) .... Take 1 Tablet By Mouth Two Times A Day 6)  Nexium 40 Mg  Cpdr (Esomeprazole Magnesium) .... By Mouth Daily. Take One Half Hour Before Eating. 7)  Pepcid 20 Mg Tabs (Famotidine) .... One At [facility]  Allergies (verified): 1)  ! Pcn 2)  ! Jonne Ply  Past History:  Past Medical History: Reviewed history from 12/14/2009 and no changes required. PULMONARY SARCOIDOSIS (ICD-135)      - dx [address] [place], last prednisone dep around 2005      - PFT's  December 14, 2009 FEV1 1.99 (86%) ratio 77 with no better after B2 and DLC0 44 > corrects to 83 HIATAL HERNIA (ICD-553.3) ASTHMA (ICD-493.90) MIGRAINES, HX OF (ICD-V13.8) ? Sinus HA    - Sinus CT neg December 14, 2009  DUD  Colonic polyps, hx of Polyclonal IgG elevation Health Maintenance.......................................................................Marland KitchenHopper     - Pneumovax  December 14, 2009   Past Surgical History: Reviewed history from 02/12/2010 and no changes required. CHOLECYSTECTOMY G2 P2 Colon polypectomy 2006, Dr Juanda Chance Rotator cuff repair Cataract extraction bilat Trans bronchial biopsy 1996 Nissen Fundoplication  Family History: Patient adopted  neg resp dz or sarcoidosis  & aunt ?HTN;  uncle & aunt with DM No FH of Colon Cancer:  Social History: Former Smoker.  Quit  1980.  Smoked for 20 years at 1 ppd. 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 All other ROS was otherwise negative.   Vital Signs:  Patient profile:   60 year old female Height:      65 inches Weight:       191 pounds BMI:     31.90 BSA:     1.94 Pulse rate:   88 / minute Pulse rhythm:   regular BP sitting:   128 / 64  (left arm) Cuff size:   regular  Vitals Entered By: Ok Anis CMA (February 18, 2010 1:12 PM)  Physical Exam  General:  Well developed, well nourished, no acute distress. Mouth:  No deformity or lesions, dentition normal. Neck:  Supple; no masses or thyromegaly. Lungs:  Clear throughout to auscultation. Heart:  Regular rate and rhythm; no murmurs, rubs,  or bruits. Abdomen:  minimal discomfort in left lower quadrant, normoactive bowel sounds. No distention, liver edge at the costal margin. Rectal:  normal rectal tone with skin tags. Tender anal canal opening, stool results were Hemoccult-negative. Extremities:  No clubbing, cyanosis, edema or deformities noted. Skin:  Intact without significant lesions or rashes. Psych:  Alert and cooperative. Normal mood and affect.   Impression & Recommendations:  Problem # 1:  GERD (ICD-530.81)  Patient has had Barrett's esophagus since May 2009. She is due for a repeat upper endoscopy. Patient is currently on Nexium 40 mg daily and antireflux measures.  Orders: EGD (EGD)  Problem # 2:  COLONIC POLYPS, HX OF (ICD-V12.72) Patient's last colonoscopy was in 2006. There were no polyps. A recall colonoscopy will be due in March 2016 or earlier if she has specific rectal problems.  Problem # 3:  RECTAL PAIN (HQI-696.29) Patient has intermittent nocturnal rectal pain relieved by stool and gas evacuation suggestive of a rectocele or possibly constipation with some degree of pelvic relaxation. She also has signs of urinary stress incontinence which also could be secondary to pelvic elaxation. I have put her on Levsin sublingual 0.125 mg and ask her to use a glycerin suppository p.r.n. rectal pain. I asked her to see her gynecologist, Dr. Francoise Ceo as well.  Patient Instructions: 1)  high-fiber diet. 2)  Levsin sublingual p.r.n.  rectal pain. 3)  Recall colonoscopy March 2016. 4)  Upper endoscopy for followup of Barrett's esophagus scheduled. 5)  Continue Nexium 40 mg daily. 6)  Antireflux measures. 7)  Copy sent to : Dr Lacretia Nicks.Alwyn Ren, Dr Judie Petit.Wert 8)  The medication list was reviewed and reconciled.  All changed / newly prescribed medications were explained.  A complete medication list was provided to the patient / caregiver. Prescriptions: LEVSIN/SL 0.125 MG SUBL (HYOSCYAMINE SULFATE) Dissolve 1 tablet under tongue as neede for rectal pain  #30 x 2   Entered by:   Lamona Curl CMA (AAMA)   Authorized by:   Hart Carwin MD   Signed by:   Lamona Curl CMA (AAMA) on 02/18/2010   Method used:   Electronically to        [facility] [phone]* (retail)       [address]       [place], [place]  [address]       Ph: [phone] or [phone]       Fax: [phone]   RxID:   [id number]

## 2010-10-08 NOTE — Progress Notes (Signed)
Summary: Triage: Chest/Stomach Pain  Phone Note Call from Patient Call back at Home Phone [phone]   Caller: Patient Summary of Call: Patient called c/o Chest pain: patient describes as stomach area below breast, patient states it feels like she has broken a bone or something. Patient states pain is intense when she cough and onset Saturday. Patient denies any left side arm/facial pain, blurred vision, dizziness, SOB ect . . . .  . .I recommended patient go to the emergency room to rule out  heart related condition, patient agreed.  Initial call taken by: Shonna Chock CMA,  June 18, 2010 8:29 AM

## 2010-10-08 NOTE — Procedures (Signed)
Summary: Upper Endoscopy  Patient: [name] Note: All result statuses are Final unless otherwise noted.  Tests: (1) Upper Endoscopy (EGD)   EGD Upper Endoscopy       DONE     [facility]     [address]     [place], [place]  [address]           ENDOSCOPY PROCEDURE REPORT           PATIENT:  [name], [name]  MR#:  [id number]     BIRTHDATE:  01/07/1951, 59 yrs. old  GENDER:  female           ENDOSCOPIST:  Hedwig Morton. Juanda Chance, MD     Referred by:  Marga Melnick, M.D.           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  The LB GIF-H180 [id number] endoscope was     introduced through the mouth and advanced to the second portion of     the duodenum, without limitations.  The instrument was slowly     withdrawn as the mucosa was fully examined.     <<PROCEDUREIMAGES>>           Post-operative change was noted (see image4). functioning Nissen     Fundoplication, no stricture or esophagitis  irregular Z-line.     With standard forceps, a biopsy was obtained and sent to pathology     (see image5 and image1). r/o Barrett's  Mild gastritis was found.     With standard forceps, a biopsy was obtained and sent to pathology     (see image3).  Otherwise the examination was normal (see image2).     Retroflexed views revealed no abnormalities.    The scope was then     withdrawn from the patient and the procedure completed.           COMPLICATIONS:  None           ENDOSCOPIC IMPRESSION:     1) Post-operative change     2) Irregular Z-line    3) Mild gastritis     4) Otherwise normal examination     s/o remote Nissen Fundoplication     RECOMMENDATIONS:     1) Await biopsy results     continue Nexiem 40 mg po qd           REPEAT EXAM:  In 2 year(s) for.           ______________________________     Hedwig Morton. Juanda Chance, MD           CC:           n.     eSIGNED:   Hedwig Morton. Dejean Tribby at 03/13/2010 03:21 PM           Lillien, Petronio, [phone]  Note: An exclamation mark (!) indicates a  result that was not dispersed into the flowsheet. Document Creation Date: 03/13/2010 3:21 PM _______________________________________________________________________  (1) Order result status: Final Collection or observation date-time: 03/13/2010 15:11 Requested date-time:  Receipt date-time:  Reported date-time:  Referring Physician:   Ordering Physician: Lina Sar [phone]) Specimen Source:  Source: Launa Grill Order Number: [id number] Lab site:   Appended Document: Upper Endoscopy     Procedures Next Due Date:    EGD: 03/2013

## 2010-10-08 NOTE — Miscellaneous (Signed)
Summary: Orders Update pft charges  Clinical Lists Changes  Orders: Added new Service order of Carbon Monoxide diffusing w/capacity (94720) - Signed Added new Service order of Lung Volumes (94240) - Signed Added new Service order of Spirometry (Pre & Post) (94060) - Signed 

## 2010-10-08 NOTE — Assessment & Plan Note (Signed)
Summary: Pulmonary/ ext acute ov for flare of cough   Copy to:  na Primary Aseem Sessums/Referring Shanik Brookshire:  Marga Melnick, MD   CC:  hoarseness started 4 days ago - worsening sob on exertion - prod cough (yellow) - had rotator cuff surgery 03-26-10.  History of Present Illness: 1 yobf quit smoking 1983 with sarcoidosis dx in 1995.  November 02, 2009  c/o productive cough   x 6 months with small amounts of yellow mucus, esp in am Temp range T-97.6 to 99.4 since 10/29/09, chills, chest congestion1)  We called in Prednisone x 6 days, doxy for 7 days and tramadol for excess cough despite advair and ventolin being added as new maint rx.  Avelox 400 mg one daily x 10 days keep taking delsym and mucinex stop advair  Prednisone  4 each am x 2days, 2x2days, 1x2days and stop  take nexium 30 min before fist  meal and pepcid 20 mg (otc) at bedtime plus diet measures as listed.   December 14, 2009 Followup with PFT's.  Pt states that her cough is some better since last seen.  She stats that in the am prod is prod with yellow sputum.  Today she c/o "sinus HA" on and off x several wks.  She states her breathing is the same- no better or worse. overall no worse off advair, no increase need for rescue.   April 30, 2010 hoarseness started  8/20  worsening sob on exertion - prod cough (yellow) - had rotator cuff surgery 03-26-10 coughing since then, did not activate previous plan to add pepcid at hs.  Pt denies any significant sore throat, dysphagia, itching, sneezing,  nasal congestion or excess secretions,  fever, chills, sweats, unintended wt loss, pleuritic or exertional cp, hempoptysis, change in activity tolerance  orthopnea pnd or leg swelling .  Pt also denies any obvious fluctuation in symptoms with weather or environmental change or other alleviating or aggravating factors.     no arthralgias, rash.  Current Medications (verified): 1)  Ventolin Hfa 108 (90 Base) Mcg/act Aers (Albuterol Sulfate) .Marland Kitchen.. 1-2  Puffs Every 4-6 Hrs As Needed 2)  Citalopram Hydrobromide 20 Mg Tabs (Citalopram Hydrobromide) .... Take 1 Tablet By Mouth Once A Day 3)  Delsym 30 Mg/9ml Lqcr (Dextromethorphan Polistirex) .... Once Daily As Needed 4)  Fluticasone Propionate 50 Mcg/act Susp (Fluticasone Propionate) .... One Spray Each Nostril Daily 5)  Mucinex 600 Mg Xr12h-Tab (Guaifenesin) .... Take 1 Tablet By Mouth Two Times A Day 6)  Nexium 40 Mg  Cpdr (Esomeprazole Magnesium) .... By Mouth Daily. Take One Half Hour Before Eating. 7)  Pepcid 20 Mg Tabs (Famotidine) .... One [facility] 8)  Levsin/sl 0.125 Mg Subl (Hyoscyamine Sulfate) .... Dissolve 1 Tablet Under Tongue As Neede For Rectal Pain 9)  Methocarbamol 500 Mg Tabs (Methocarbamol) .... Take One Every 6 Hrs As Needed Muscle Spasm  Allergies (verified): 1)  ! Pcn 2)  ! Jonne Ply  Past History:  Past Medical History: PULMONARY SARCOIDOSIS (ICD-135)      - dx 73 [place], last prednisone dep around 2005      - PFT's  December 14, 2009 FEV1 1.99 (86%) ratio 77 with no better after B2 and DLC0 44 > corrects to 83 HIATAL HERNIA (ICD-553.3)/GERD      - EGD pos for reflux injury by bx but neg Barretts 03/13/10 ASTHMA (ICD-493.90) MIGRAINES, HX OF (ICD-V13.8) ? Sinus HA    - Sinus CT neg December 14, 2009  DUD  Colonic polyps, hx of Polyclonal IgG elevation Health Maintenance.......................................................................Marland KitchenHopper     - Pneumovax  December 14, 2009   Vital Signs:  Patient profile:   60 year old female Weight:      203.13 pounds BMI:     33.92 O2 Sat:      95 % on Room air Temp:     98.5 degrees F oral Pulse rate:   110 / minute BP sitting:   110 / 70  (right arm) Cuff size:   regular  Vitals Entered By: Abigail Miyamoto RN (April 30, 2010 10:54 AM)  O2 Flow:  Room air  Physical Exam  Additional Exam:  amb bf nad with voice fatigue and mild pseudowheeze resolves with purse lip maneuver  wt   184 August 10, 2008 >  192 November 02, 2009 > 191 December 14, 2009 > 203 April 30, 2010   HEENT: nl dentition, turbinates, and orophanx. Nl external ear canals without cough reflex Neck without JVD/Nodes/TM Lungs clear to A and P bilaterally without cough on insp or exp maneuvers RRR no s3 or murmur or increase in P2 Abd soft and benign with nl excursion in the supine position. No bruits or organomegaly Ext warm without calf tenderness, cyanosis clubbing or edema Skin warm and dry without lesions     CXR  Procedure date:  04/30/2010  Findings:      Interstitial lung disease and bihilar adenopathy do not appear significantly changed, consistent with sarcoidosis.  Impression & Recommendations:  Problem # 1:  PULMONARY SARCOIDOSIS (ICD-135) No evidence of flare, no need for chronic rx  Problem # 2:  GERD (ICD-530.81)  Her updated medication list for this problem includes:    Nexium 40 Mg Cpdr (Esomeprazole magnesium) ..... By mouth daily. take one half hour before eating.    Pepcid 20 Mg Tabs (Famotidine) ..... One at beditme    Levsin/sl 0.125 Mg Subl (Hyoscyamine sulfate) .Marland Kitchen... Dissolve 1 tablet under tongue as neede for rectal pain  Explained natural h/o uri and why it's necessary in patients at risk to rx short term with PPI to reduce risk of evolving cyclical cough triggered by epithelial injury and a heightened sensitivty to the effects of any upper airway irritants,  most importantly acid - related    Note that Of the three most common causes of chronic cough, only one (GERD, which she has by egd/bx) can actually cause the other two and perpetuate the cylce of cough inducing airway trauma, inflammation, heightened sensitivity to reflux which is prompted by the cough itself via a cyclical mechanism.  This may partially respond to steroids and look like asthma and post nasal drainage but never erradicated completely unless the cough and the secondary reflux are eliminated, preferably both at the same  time.  See instructions for specific recommendations   Orders: Est. Patient Level IV ([address])  Medications Added to Medication List This Visit: 1)  Delsym 30 Mg/73ml Lqcr (Dextromethorphan polistirex) .... Once daily as needed 2)  Methocarbamol 500 Mg Tabs (Methocarbamol) .... Take one every 6 hrs as needed muscle spasm 3)  Prednisone 10 Mg Tabs (Prednisone) .... 4 each am x 2days, 2x2days, 1x2days and stop 4)  Tramadol Hcl 50 Mg Tabs (Tramadol hcl) .... One to two by mouth every 4-6 hours  Other Orders: T-2 View CXR (71020TC)  Patient Instructions: 1)  Nexium 40 mg Take  one 30-60 min before first meal of the day and Pepcid 20 mg at bedtime  whenever  you have  any cough sore throat or short of breath x 6 weeks then return if not satisfied 2)  Prednisone 4 each am x 2days, 2x2days, 1x2days and stop  3)  If your breathing worsens or you need to use your rescue inhaler more than twice weekly or wake up more than twice a month with any respiratory symptoms or require more than two rescue inhalers per year, we need to see you right away. 4)  Take delsym two tsp every 12 hours and add tramadol 50 mg up to every 4 hours to suppress the urge to cough. Swallowing water or using ice chips/non mint and menthol containing candies (such as lifesavers or sugarless jolly ranchers) are also effective.  5)  . 6)    Prescriptions: TRAMADOL HCL 50 MG  TABS (TRAMADOL HCL) One to two by mouth every 4-6 hours  #40 x 0   Entered and Authorized by:   Nyoka Cowden MD   Signed by:   Nyoka Cowden MD on 04/30/2010   Method used:   Electronically to        [facility] [phone]* (retail)       [address]       [place], [place]  [address]       Ph: [phone] or [phone]       Fax: [phone]   RxID:   [id number] PREDNISONE 10 MG  TABS (PREDNISONE) 4 each am x 2days, 2x2days, 1x2days and stop  #14 x 0   Entered and Authorized by:   Nyoka Cowden MD   Signed by:   Nyoka Cowden MD on 04/30/2010    Method used:   Electronically to        [facility] [phone]* (retail)       [address]       [place], [place]  [address]       Ph: [phone] or [phone]       Fax: [phone]   RxID:   [id number]

## 2010-10-08 NOTE — Progress Notes (Signed)
Summary: chest pain--worse with cough ---ov with TP  Phone Note Call from Patient   Caller: Patient Call For: wert Summary of Call: pt wants an xray asap today re: pain on r side under her breast. also has had fecer. she thinks this may be appendisitis. she is flying tomorrow and doesn't want to fly without having this done. cell [phone] Initial call taken by: Tivis Ringer, CNA,  June 20, 2010 12:18 PM  Follow-up for Phone Call        called and spoke with pt.  pt c/o pain under R breast that is worse when she coughs or sneezes.  Pt requests to be seen today.  OV with TP at 2pm today.  Arman Filter LPN  June 20, 2010 12:37 PM

## 2010-10-08 NOTE — Op Note (Signed)
Summary: Hernia Repair   NAME:  [name], [name]              ACCOUNT NO.:  [id number]   MEDICAL RECORD NO.:  [id number]          PATIENT TYPE:  OIB   LOCATION:  1420                         FACILITY:  [facility]   PHYSICIAN:  Thornton Park. Daphine Deutscher, MD  DATE OF BIRTH:  Jan 07, 1951   DATE OF PROCEDURE:  04/30/2005  DATE OF DISCHARGE:                                 OPERATIVE REPORT   PREOPERATIVE DIAGNOSIS:  Large type 3 mixed hiatal hernia.   POSTOPERATIVE DIAGNOSIS:  Large type 3 mixed hiatal hernia containing most  of stomach as well as colon.   SURGEON:  Luretha Murphy, MD   ASSISTANT:  Felicity Pellegrini, MD   ANESTHESIA:  General endotracheal.   DRAINS:  None.   OPERATIVE TIME:  2 hours.   DESCRIPTION OF PROCEDURE:  This 60 year old lady is brought to OR 1 on the  afternoon of April 30, 2005.  There was a 1 hour delay in getting started  since the hospital has been full, and they were holding people in the  recovery room.  After general anesthesia was administered, the abdomen was  prepped with Betadine and draped sterilely.  Access was gained to the  abdomen through the left upper quadrant using an OptiVu technique.  The  abdomen was then insufflated, and the 5 mm upper midline was used for the  [facility] retractor, two 11s on the right and another 11 just below the  umbilicus for the scope.  First, after reflecting the stomach, I grasped the  stomach and began pulling it out and found that the colon was entrapped up  into the hernia sac as well.  I reduced this in its entirety, leaving a very  large defect in the stomach and wanted to go back up inside the chest.  I  used a harmonic scalpel to carefully take down the hernia and then excised  the sac, freeing up the distal esophagus, enabling me to get the esophagus  down completely into the abdomen.  Penrose drain was placed posteriorly,  again to hold the traction.  Once I had this freed, I then repaired the  posterior crura with 3  pledgeted sutures.  One of the needles broke off, and  it was a tiny little needle which was not able to be retrieved or found, but  the pledgeted sutures were tied down, and I closed the posterior portion of  the hiatus pretty well.  Anteriorly, a single pledgeted suture was used.  I  had used the lighted bougie to help identify the esophageal structure, and I  passed it on down into the stomach.  I then reached around and grasped the  stomach and then a contiguous fashion found an anterior portion of stomach  to plicate around a #50 lighted bougie.  With the lighted bougie in place, I  then sutured the wrap together with three endo suture 2-2 sutures, putting  clips on the uppermost knot.  Once those were tied,  the bougie was  withdrawn.  Hiatal hernia repair was intact, and the wrap was intact and was  viable.  There is no bleeding to  speak of.  And everything appeared dry at the end of the case.  I then  deflated the abdomen.  The wounds were closed with 4-0 Vicryl with Benzoin  and Steri-Strips.  The patient was taken to the recovery room in  satisfactory condition.      Thornton Park Daphine Deutscher, MD  Electronically Signed     MBM/MEDQ  D:  04/30/2005  T:  04/30/2005  Job:  [id number]   cc:   Lina Sar, M.D. [facility]  [address]  [place]  [place] [address]   Charlaine Dalton. Sherene Sires, M.D. [facility]  [address]  [place] [address]   Titus Dubin. Alwyn Ren, M.D. [facility]  [phone] [place]  [place] [address]

## 2010-10-08 NOTE — Assessment & Plan Note (Signed)
Summary: Pulmonary/ ext ov  - TRY OFF ADVAIR   Visit Type:  Sick Visit Primary Provider/Referring Provider:  Alwyn Ren  CC:  Pt c/o productive cough with small amounts of yellow mucus, Temp range T-97.6 to 99.4 since 10/29/09, chills, and chest congestion.  History of Present Illness: 53 yobf quit smoking 1983 with sarcoidosis dx in 1995.  August 10, 2008 ov co increased cough x  1 week prod of yellow mucus, sore throat, no fever, mild chills, aches, rx with advil cold and sinus plus delsym.   Imp was upper airway cough syndrome REC Prednsione x 6 days,   Nexium samples x 2 weeks schedule a follow-up appointment as needed if not 100% by mid december > never returned since "all better"    November 02, 2009  c/o productive cough   x 6 months with small amounts of yellow mucus, esp in am Temp range T-97.6 to 99.4 since 10/29/09, chills, chest congestion1)  We called in Prednisone x 6 days, doxy for 7 days and tramadol for excess cough despite advair and ventolin being added as new maint rx. Pt denies any significant sore throat, dysphagia, itching, sneezing,  nasal congestion or excess secretions,  fever, chills, sweats, unintended wt loss, pleuritic or exertional cp, hempoptysis, change in activity tolerance  orthopnea pnd or leg swelling  Pt also denies any obvious fluctuation in symptoms with weather or environmental change or other alleviating or aggravating factors.       Current Medications (verified): 1)  Advair Diskus 250-50 Mcg/dose Misc (Fluticasone-Salmeterol) .Marland Kitchen.. 1 Inhalation Every 12 Hrs 2)  Ventolin Hfa 108 (90 Base) Mcg/act Aers (Albuterol Sulfate) .Marland Kitchen.. 1-2 Puffs Every 4-6 Hrs As Needed 3)  Citalopram Hydrobromide 20 Mg Tabs (Citalopram Hydrobromide) .... Take 1 Tablet By Mouth Once A Day 4)  Delsym 30 Mg/65ml Lqcr (Dextromethorphan Polistirex) .... Once Daily 5)  Fluticasone Propionate 50 Mcg/act Susp (Fluticasone Propionate) .... One Spray Each Nostril Daily 6)  Mucinex 600 Mg  Xr12h-Tab (Guaifenesin) .... Take 1 Tablet By Mouth Two Times A Day  Allergies (verified): 1)  ! Pcn 2)  ! Jonne Ply  Past History:  Past Medical History: PULMONARY SARCOIDOSIS (ICD-135)      - dx 68 [place], last prednisone dep around 2005  HIATAL HERNIA (ICD-553.3) ASTHMA (ICD-493.90) MIGRAINES, HX OF (ICD-V13.8) DUD  Colonic polyps, hx of Polyclonal IgG elevation  Vital Signs:  Patient profile:   60 year old female Height:      64.25 inches Weight:      192.38 pounds BMI:     32.88 O2 Sat:      99 % on Room air Temp:     99.0 degrees F oral Pulse rate:   101 / minute BP sitting:   108 / 66  (left arm) Cuff size:   regular  Vitals Entered By: Zackery Barefoot CMA (November 02, 2009 11:23 AM)  O2 Flow:  Room air CC: Pt c/o productive cough with small amounts of yellow mucus, Temp range T-97.6 to 99.4 since 10/29/09, chills, chest congestion Comments Medications reviewed with patient Verified contact number and pharmacy with patient Zackery Barefoot CMA  November 02, 2009 11:24 AM    Physical Exam  Additional Exam:  amb bf nad with voice fatigue and mild pseudowheeze resolves with purse lip maneuver  wt   184 August 10, 2008 > 192 November 02, 2009   HEENT: nl dentition, turbinates, and orophanx. Nl external ear canals without cough reflex Neck without JVD/Nodes/TM Lungs  clear to A and P bilaterally without cough on insp or exp maneuvers RRR no s3 or murmur or increase in P2 Abd soft and benign with nl excursion in the supine position. No bruits or organomegaly Ext warm without calf tenderness, cyanosis clubbing or edema Skin warm and dry without lesions     CXR  Procedure date:  11/02/2009  Findings:      Comparison: 08/10/2008   Findings: Chronic interstitial lung disease with bibasilar fibrosis and low lung volumes appear stable.  Bilateral hilar and right paratracheal adenopathy is again seen which is stable and consistent with known  sarcoidosis.  No acute areas of pulmonary opacity or pleural effusion are identified.  Heart size is normal.   IMPRESSION:   1.  Stable chronic interstitial lung disease and bibasilar fibrosis.  No acute findings. 2.  Stable bilateral hilar and mediastinal adenopathy, consistent with known sarcoidosis.  Impression & Recommendations:  Problem # 1:  PULMONARY SARCOIDOSIS (ICD-135) no evidence of active dz  Problem # 2:  COUGH (ICD-786.2)  The most common causes of chronic cough in immunocompetent adults include: upper airway cough syndrome (UACS), previously referred to as postnasal drip syndrome,  caused by variety of rhinosinus conditions; (2) asthma; (3) GERD; (4) chronic bronchitis from cigarette smoking or other inhaled environmental irritants; (5) nonasthmatic eosinophilic bronchitis; and (6) bronchiectasis. These conditions, singly or in combination, have accounted for up to 94% of the causes of chronic cough in prospective studies.  This is most c/w Upper airway cough syndrome, so named because it's frequently impossible to sort out how much is LPR vs CR/sinusitis with freq throat clearing generating secondary extra esophageal GERD from wide swings in gastric pressure that occur with throat clearing, promoting self use of mint and menthol lozenges that reduce the lower esophageal sphincter tone and exacerbate the problem further.  These symptoms are easily confused with asthma/copd by even experienced pulmonogists because they overlap so much. These are the same pts who not infrequently have failed to tolerate ace inhibitors,  dry powder inhalers or biphosphonates or report having reflux symptoms that don't respond to standard doses of PPI.  See instructions for specific recommendations   Orders: Est. Patient Level IV (04540)  Problem # 3:  ASTHMA (ICD-493.90)  DDX of  difficult airways managment all start with A and  include Adherence, Ace Inhibitors, Acid Reflux, Active Sinus  Disease, Alpha 1 Antitripsin deficiency, Anxiety masquerading as Airways dz,  ABPA,  allergy(esp in young), Aspiration (esp in elderly), Adverse effects of DPI,  Active smokers, plus one B  = Beta blocker use..    This is either active sinus dz, acid reflux or adverse effect of dpi driving her symptoms.  See instructions for specific recommendations   Medications Added to Medication List This Visit: 1)  Citalopram Hydrobromide 20 Mg Tabs (Citalopram hydrobromide) .... Take 1 tablet by mouth once a day 2)  Delsym 30 Mg/64ml Lqcr (Dextromethorphan polistirex) .... Once daily 3)  Fluticasone Propionate 50 Mcg/act Susp (Fluticasone propionate) .... One spray each nostril daily 4)  Mucinex 600 Mg Xr12h-tab (Guaifenesin) .... Take 1 tablet by mouth two times a day 5)  Avelox 400 Mg Tabs (Moxifloxacin hcl) .... By mouth daily 6)  Prednisone 10 Mg Tabs (Prednisone) .... 4 each am x 2days, 2x2days, 1x2days and stop 7)  Nexium 40 Mg Cpdr (Esomeprazole magnesium) .... By mouth daily. take one half hour before eating. 8)  Pepcid 20 Mg Tabs (Famotidine) .... One at beditme  Other Orders:  T-2 View CXR (71020TC)  Patient Instructions: 1)  Avelox 400 mg one daily x 10 days 2)  keep taking delsym and mucinex 3)  stop advair  4)  Prednisone  4 each am x 2days, 2x2days, 1x2days and stop 5)  Acid reflux is a leading suspect here and needs to be eliminated  completely before considering additional studies or treatment options. To suppress this maximally, take nexium 30 min before fist  meal and pepcid 20 mg (otc) at bedtime plus diet measures as listed.  6)  GERD (REFLUX)  is a common cause of respiratory symptoms. It commonly presents without heartburn and can be treated with medication, but also with lifestyle changes including avoidance of late meals, excessive alcohol, smoking cessation, and avoid fatty foods, chocolate, peppermint, colas, red wine, and acidic juices such as orange juice. NO MINT OR MENTHOL  PRODUCTS SO NO COUGH DROPS  7)  USE SUGARLESS CANDY INSTEAD (jolley ranchers)  8)  NO OIL BASED VITAMINS 9)  Please schedule a follow-up appointment in 6 weeks, sooner if needed for PFT's Prescriptions: NEXIUM 40 MG  CPDR (ESOMEPRAZOLE MAGNESIUM) By mouth daily. Take one half hour before eating.  #34 x 11   Entered and Authorized by:   Nyoka Cowden MD   Signed by:   Nyoka Cowden MD on 11/02/2009   Method used:   Electronically to        [facility] [phone]* (retail)       [address]       [place], [place]  [address]       Ph: [phone] or [phone]       Fax: [phone]   RxID:   [id number] PREDNISONE 10 MG  TABS (PREDNISONE) 4 each am x 2days, 2x2days, 1x2days and stop  #14 x 0   Entered and Authorized by:   Nyoka Cowden MD   Signed by:   Nyoka Cowden MD on 11/02/2009   Method used:   Electronically to        [facility] [phone]* (retail)       [address]       [place], [place]  [address]       Ph: [phone] or [phone]       Fax: [phone]   RxID:   [id number] AVELOX 400 MG  TABS (MOXIFLOXACIN HCL) By mouth daily  #10 x 0   Entered and Authorized by:   Nyoka Cowden MD   Signed by:   Nyoka Cowden MD on 11/02/2009   Method used:   Electronically to        [facility] [phone]* (retail)       [address]       [place], [place]  [address]       Ph: [phone] or [phone]       Fax: [phone]   RxID:   [id number]    Immunization History:  Influenza Immunization History:    Influenza:  historical (07/09/2009)

## 2010-10-08 NOTE — Letter (Signed)
Summary: [employer] Authorization Notification  [employer] Authorization Notification   Imported By: Roderic Ovens 01/28/2010 15:52:50  _____________________________________________________________________  External Attachment:    Type:   Image     Comment:   External Document

## 2010-10-08 NOTE — Letter (Signed)
Summary: Endoscopy- Changed to Office Visit  [facility]  [address] [place], [place] [address]   Phone: [phone]  Fax: [phone]      December 19, 2009 MRN: [id number]   [name] [address] [place], [place]  [address]   Dear Ms. Nott,   According to our records, it is time for you to schedule an Endoscopy. However, after reviewing your medical record, I feel that an office visit would be most appropriate to more completely evaluate you and determine your need for a repeat procedure.  Please call [phone] (option #2) at your convenience to schedule an office visit. If you have any questions, concerns, or feel that this letter is in error, we would appreciate your call.   Sincerely,  Hedwig Morton. Juanda Chance, M.D.  [facility] [phone]

## 2010-10-08 NOTE — Progress Notes (Signed)
Summary: Pain in her chest  Phone Note Call from Patient Call back at Home Phone [phone] Call back at or [phone] cell   Call For: Dr Juanda Chance Reason for Call: Talk to Nurse Summary of Call: Hurts when she coughs in her chest. Wonders if she needs an MRI? Initial call taken by: Leanor Kail [facility],  June 18, 2010 8:19 AM  Follow-up for Phone Call        Pain in her chest, upper abdomen when she coughs or blows her nose.  Patient  is advised to see her primary care for these symptoms.  She will call back if she has other GI concerns. Follow-up by: Darcey Nora RN, CGRN,  June 18, 2010 8:24 AM

## 2010-10-08 NOTE — Consult Note (Signed)
Summary: [facility]  [facility]   Imported By: Lanelle Bal 06/07/2010 13:58:35  _____________________________________________________________________  External Attachment:    Type:   Image     Comment:   External Document

## 2010-10-08 NOTE — Letter (Signed)
Summary: Patient [facility] Biopsy Results  [facility]  [address] [place], [place] [address]   Phone: [phone]  Fax: [phone]        March 15, 2010 MRN: [id number]    [name] [address] [place], [place]  [address]    Dear [name],  I am pleased to inform you that the biopsies taken during your recent endoscopic examination did not show any evidence of cancer upon pathologic examination.There is no evidence of Barrett's esophagus.  Additional information/recommendations:  __No further action is needed at this time.  Please follow-up with      your primary care physician for your other healthcare needs.  __ Please call [phone] to schedule a return visit to review      your condition.  _x_ Continue with the treatment plan as outlined on the day of your      exam.  _x_ You should have a repeat endoscopic examination for this problem              in 3_ /years.   Please call us if you are having persistent problems or have questions about your condition that have not been fully answered at this time.  Sincerely,  Hart Carwin MD  This letter has been electronically signed by your physician.  Appended Document: Patient Notice-Endo Biopsy Results letter mailed.

## 2010-10-08 NOTE — Assessment & Plan Note (Signed)
Summary: Pulmonary/ final summary f/u ov   Primary Provider/Referring Provider:  Alwyn Ren  CC:  Followup with PFT's.  Pt states that her cough is some better since last seen.  She stats that in the am prod is prod with yellow sputum.  Today she c/o "sinus HA" on and off x several wks.  She states her breathing is the same- no better or worse.Marland Kitchen  History of Present Illness: 57 yobf quit smoking 1983 with sarcoidosis dx in 1995.  November 02, 2009  c/o productive cough   x 6 months with small amounts of yellow mucus, esp in am Temp range T-97.6 to 99.4 since 10/29/09, chills, chest congestion1)  We called in Prednisone x 6 days, doxy for 7 days and tramadol for excess cough despite advair and ventolin being added as new maint rx.  Avelox 400 mg one daily x 10 days keep taking delsym and mucinex stop advair  Prednisone  4 each am x 2days, 2x2days, 1x2days and stop  take nexium 30 min before fist  meal and pepcid 20 mg (otc) at bedtime plus diet measures as listed.   December 14, 2009 Followup with PFT's.  Pt states that her cough is some better since last seen.  She stats that in the am prod is prod with yellow sputum.  Today she c/o "sinus HA" on and off x several wks.  She states her breathing is the same- no better or worse. overall no worse off advair, no increase need for rescue.  Pt denies any significant sore throat, dysphagia, itching, sneezing,   fever, chills, sweats, unintended wt loss, pleuritic or exertional cp, hempoptysis, change in activity tolerance  orthopnea pnd or leg swelling.  Pt also denies any obvious fluctuation in symptoms with weather or environmental change or other alleviating or aggravating factors, doesn't think fluticasone helping.   Current Medications (verified): 1)  Ventolin Hfa 108 (90 Base) Mcg/act Aers (Albuterol Sulfate) .Marland Kitchen.. 1-2 Puffs Every 4-6 Hrs As Needed 2)  Citalopram Hydrobromide 20 Mg Tabs (Citalopram Hydrobromide) .... Take 1 Tablet By Mouth Once A Day 3)   Delsym 30 Mg/42ml Lqcr (Dextromethorphan Polistirex) .... Once Daily 4)  Fluticasone Propionate 50 Mcg/act Susp (Fluticasone Propionate) .... One Spray Each Nostril Daily 5)  Mucinex 600 Mg Xr12h-Tab (Guaifenesin) .... Take 1 Tablet By Mouth Two Times A Day 6)  Nexium 40 Mg  Cpdr (Esomeprazole Magnesium) .... By Mouth Daily. Take One Half Hour Before Eating. 7)  Pepcid 20 Mg Tabs (Famotidine) .... One At [facility]  Allergies (verified): 1)  ! Pcn 2)  ! Jonne Ply  Past History:  Past Medical History: PULMONARY SARCOIDOSIS (ICD-135)      - dx 62 [place], last prednisone dep around 2005      - PFT's  December 14, 2009 FEV1 1.99 (86%) ratio 77 with no better after B2 and DLC0 44 > corrects to 83 HIATAL HERNIA (ICD-553.3) ASTHMA (ICD-493.90) MIGRAINES, HX OF (ICD-V13.8) ? Sinus HA    - Sinus CT neg December 14, 2009  DUD  Colonic polyps, hx of Polyclonal IgG elevation Health Maintenance.......................................................................Marland KitchenHopper     - Pneumovax  December 14, 2009   Vital Signs:  Patient profile:   60 year old female Height:      65 inches Weight:      191 pounds BMI:     31.90 O2 Sat:      94 % on Room air Temp:     98.4 degrees F oral Pulse  rate:   88 / minute BP sitting:   110 / 66  (left arm)  Vitals Entered By: Vernie Murders (December 14, 2009 11:08 AM)  O2 Flow:  Room air CC: Followup with PFT's.  Pt states that her cough is some better since last seen.  She stats that in the am prod is prod with yellow sputum.  Today she c/o "sinus HA" on and off x several wks.  She states her breathing is the same- no better or worse.   Physical Exam  Additional Exam:  amb bf nad with voice fatigue and mild pseudowheeze resolves with purse lip maneuver  wt   184 August 10, 2008 > 192 November 02, 2009 > 191 December 14, 2009   HEENT: nl dentition, turbinates, and orophanx. Nl external ear canals without cough reflex Neck without JVD/Nodes/TM Lungs clear  to A and P bilaterally without cough on insp or exp maneuvers RRR no s3 or murmur or increase in P2 Abd soft and benign with nl excursion in the supine position. No bruits or organomegaly Ext warm without calf tenderness, cyanosis clubbing or edema Skin warm and dry without lesions     CXR  Procedure date:  11/02/2009  Findings:      Comparison: 08/10/2008   Findings: Chronic interstitial lung disease with bibasilar fibrosis and low lung volumes appear stable.  Bilateral hilar and right paratracheal adenopathy is again seen which is stable and consistent with known sarcoidosis.  No acute areas of pulmonary opacity or pleural effusion are identified.  Heart size is normal.   IMPRESSION:   1.  Stable chronic interstitial lung disease and bibasilar fibrosis.  No acute findings. 2.  Stable bilateral hilar and mediastinal adenopathy, consistent with known sarcoidosis.  CT of Sinus  Procedure date:  12/14/2009  Findings:      IMPRESSION: 1.  No acute sinus disease.  Chronic left maxillary sinus mucous retention cyst. 2.  Left maxillary canine tooth dental caries and periapical lucency.  Recommend dental follow up.  Impression & Recommendations:  Problem # 1:  PULMONARY SARCOIDOSIS (ICD-135) The goal with a chronic steroid dependent illness is always arriving at the lowest effective dose that controls the disease/symptoms and not accepting a set "formula" which is based on statistics that don't take into accound individual variability or the natural hx of the dz in every individual patient, which may well vary over time.  Time has proven that the right floor for her is "0" steroids using the philosophy that the punishment should fit the crime.  No worse off advair, no evidence of airflow obstruction or deline in function off systemic steroids, so pulmary f/u can be prn  Problem # 2:  COUGH (ICD-786.2)  Sinus ct neg and she is not convinced nasal steroids helping so ok to d/c  as "reverse therapeutic challenge"  Upper airway cough syndrome, so named because it's frequently impossible to sort out how much is LPR vs CR/sinusitis with freq throat clearing generating secondary extra esophageal GERD from wide swings in gastric pressure that occur with throat clearing, promoting self use of mint and menthol lozenges that reduce the lower esophageal sphincter tone and exacerbate the problem further.  These symptoms are easily confused with asthma/copd by even experienced pulmonogists because they overlap so much. These are the same pts who not infrequently have failed to tolerate ace inhibitors,  dry powder inhalers or biphosphonates or report having reflux symptoms that don't respond to standard doses of PPI.  next step is either allergy or ENT eval,  allergy if flares off steroids would seem most likely  Other Orders: Misc. Referral (Misc. Ref) Est. Patient Level IV (40981)  Patient Instructions: 1)  Pneumovax given today 2)  See Patient Care Coordinator before leaving for sinus ct 3)  ok to try off fluticasone to see if it makes any difference 4)  Pulmonary f/u can be as needed   Pneumovax Vaccine    Vaccine Type: Pneumovax    Site: left deltoid    Mfr: [employer]ck    Dose: 0.5 ml    Route: IM    Given by: Vernie Murders    Exp. Date: 12/27/2010    Lot #: [id number]    VIS given: 04/05/96 version given December 14, 2009. Injection given by Rondel Oh  December 14, 2009 11:47 AM     CXR  Procedure date:  11/02/2009  Findings:      Comparison: 08/10/2008   Findings: Chronic interstitial lung disease with bibasilar fibrosis and low lung volumes appear stable.  Bilateral hilar and right paratracheal adenopathy is again seen which is stable and consistent with known sarcoidosis.  No acute areas of pulmonary opacity or pleural effusion are identified.  Heart size is normal.   IMPRESSION:   1.  Stable chronic interstitial lung disease and  bibasilar fibrosis.  No acute findings. 2.  Stable bilateral hilar and mediastinal adenopathy, consistent with known sarcoidosis.  CT of Sinus  Procedure date:  12/14/2009  Findings:      IMPRESSION: 1.  No acute sinus disease.  Chronic left maxillary sinus mucous retention cyst. 2.  Left maxillary canine tooth dental caries and periapical lucency.  Recommend dental follow up.

## 2010-10-08 NOTE — Procedures (Signed)
Summary: COLON   Colonoscopy  Procedure date:  11/29/2004  Findings:      Location:  [facility].   Patient Name: Kiara, Day. MRN: [id number] Procedure Procedures: Colonoscopy CPT: [phone].  Personnel: Endoscopist: Rakel Junio L. Juanda Chance, MD.  Referred By: Titus Dubin Alwyn Ren, MD.  Exam Location: Exam performed in Outpatient Clinic. Outpatient  Patient Consent: Procedure, Alternatives, Risks and Benefits discussed, consent obtained, from patient. Consent was obtained by the RN.  Indications  Average Risk Screening Routine.  History  Current Medications: Patient is not currently taking Coumadin.  Pre-Exam Physical: Performed Nov 29, 2004. Entire physical exam was normal.  Exam Exam: Extent of exam reached: Cecum, extent intended: Cecum.  The cecum was identified by appendiceal orifice and IC valve. Colon retroflexion performed. Images taken. ASA Classification: I. Tolerance: good.  Monitoring: Pulse and BP monitoring, Oximetry used. Supplemental O2 given.  Colon Prep Used Miralax for colon prep. Prep results: good.  Sedation Meds: Patient assessed and found to be appropriate for moderate (conscious) sedation. Fentanyl 50 mcg. given IV. Versed 7 mg. given IV.  Findings NORMAL EXAM: Cecum.   Assessment Normal examination.  Comments: no polyps Events  Unplanned Interventions: No intervention was required.  Unplanned Events: There were no complications. Plans Patient Education: Patient given standard instructions for: Yearly hemoccult testing recommended. Patient instructed to get routine colonoscopy every 10 years.  Disposition: After procedure patient sent to recovery. After recovery patient sent home.   This report was created from the original endoscopy report, which was reviewed and signed by the above listed endoscopist.

## 2010-10-10 NOTE — Assessment & Plan Note (Signed)
Summary: CPX/KN   Vital Signs:  Patient profile:   60 year old female Height:      64.75 inches Weight:      202.4 pounds BMI:     34.06 Temp:     98.9 degrees F oral Pulse rate:   88 / minute Resp:     16 per minute BP sitting:   116 / 74  (left arm) Cuff size:   large  Vitals Entered By: Shonna Chock CMA (September 05, 2010 2:48 PM) CC: CPX , EKG done in the hospital 03/21/2010 (copy printed from [place]), Cough   Primary Care Yarissa Reining:  Marga Melnick, MD   CC:  CPX , EKG done in the hospital 03/21/2010 (copy printed from [place]), and Cough.  History of Present Illness:     [name] is here for a physical; she has recurrent cough.  The patient reports intermittently  productive cough, shortness of breath with exertion , and low grade  fever, but denies pleuritic chest pain, wheezing,  and hemoptysis.  Associated symtpoms include chronic rhinitis.  The patient denies the following symptoms: cold/URI symptoms, sore throat, nasal congestion, and acid reflux symptoms.  The cough is worse with lying down over night.   temporarily effective prior treatments have included OTC cough medications, Delsym & Mucinex DM.  Risk factors include history of allergic rhinitis, history of asthma, and history of reflux.    Current Medications (verified): 1)  Nexium 40 Mg  Cpdr (Esomeprazole Magnesium) .... By Mouth Daily. Take One Half Hour Before Eating. 2)  Levsin/sl 0.125 Mg Subl (Hyoscyamine Sulfate) .... Dissolve 1 Tablet Under Tongue As Neede For Rectal Pain 3)  Ventolin Hfa 108 (90 Base) Mcg/act Aers (Albuterol Sulfate) .Marland Kitchen.. 1-2 Puffs Every 4-6 Hrs As Needed 4)  Delsym 30 Mg/5ml Lqcr (Dextromethorphan Polistirex) .... Once Daily As Needed  Allergies: 1)  ! Pcn 2)  ! Jonne Ply  Past History:  Past Medical History: PULMONARY SARCOIDOSIS (ICD-135)      Diagnosed  [address] [place], last parenteral steroids approx  2005      - PFT's  December 14, 2009 FEV1 1.99 (86%) ratio 77 , no improvement   after bronchodilator ; DLC0 44 > corrects to 83 HIATAL HERNIA (ICD-553.3)/GERD      - EGD + for reflux injury by biopsy  but neg [place] 03/13/10 ASTHMA (ICD-493.90) MIGRAINES, PMH  OF (ICD-V13.8) ? Sinus HA    - Sinus CT neg December 14, 2009  DUD  Colonic polyps, PMH  of Polyclonal IgG elevation Health Maintenance:     - Pneumovax  December 14, 2009 , Flu shot up to date  Past Surgical History: CHOLECYSTECTOMY G2  P2 Colon polypectomy 2006, Dr Juanda Chance Rotator cuff repair X 3( 2 on L & 1 on R) Cataract extraction bilaterally Trans bronchial biopsy 1996 Nissen Fundoplication  Family History: Patient adopted   aunt :HTN;  uncle & aunt : DM No FH of Colon Cancer:  Social History: Former Smoker.  Quit 1980.  Smoked for 20 years at 1 ppd. Occupation: Educational psychologist, on medical leave Married Alcohol use:no   Review of Systems  The patient denies anorexia, vision loss, decreased hearing, hoarseness, chest pain, syncope, abdominal pain, melena, hematochezia, severe indigestion/heartburn, hematuria, suspicious skin lesions, depression, unusual weight change, abnormal bleeding, enlarged lymph nodes, and angioedema.         weight up 20-30 #; no CVE   since shoulder issues  began. Variable bowel changes ;  loose to constipated.? IBS as per Dr Juanda Chance.  Physical Exam  General:  well-nourished,in no acute distress; alert,appropriate and cooperative throughout examination Head:  Normocephalic and atraumatic without obvious abnormalities.  Eyes:  No corneal or conjunctival inflammation noted. Perrla. Funduscopic exam benign, without hemorrhages, exudates or papilledema.  Ears:  External ear exam shows no significant lesions or deformities.  Otoscopic examination reveals clear canals, tympanic membranes are intact bilaterally without bulging, retraction, inflammation or discharge. Hearing is grossly normal bilaterally. Nose:  External nasal examination shows no deformity or  inflammation. Nasal mucosa are pink and moist without lesions or exudates. Mouth:  Oral mucosa and oropharynx without lesions or exudates.  Teeth in good repair; upper partial. No .pharyngeal erythema.   Neck:  No deformities, masses, or tenderness noted. Lungs:  Normal respiratory effort, chest expands symmetrically. Lungs are surprisingly  clear to auscultation, no crackles or wheezes. Heart:  Normal rate and regular rhythm. S1 and S2 normal without gallop, murmur, click, rub or other extra sounds. S4 Abdomen:  Bowel sounds positive,abdomen soft and non-tender without masses, organomegaly or hernias noted. Genitalia:  Dr Gaynell Face Msk:  No deformity or scoliosis noted of thoracic or lumbar spine.   Pulses:  R and L carotid,radial,dorsalis pedis and posterior tibial pulses are full and equal bilaterally Extremities:  No  cyanosis or edema, or deformity noted with normal full range of motion of all joints.  Mid varus changes of knees. Pes planus Neurologic:  alert & oriented X3 and DTRs symmetrical and normal.   Skin:  Dry , irregular  hyperpigmented  L medial ankle ; no erythema nodosum Cervical Nodes:  No lymphadenopathy noted Axillary Nodes:  No palpable lymphadenopathy Psych:  memory intact for recent and remote, normally interactive, and good eye contact.     Impression & Recommendations:  Problem # 1:  ROUTINE GENERAL MEDICAL EXAM@HEALTH  CARE FACL (ICD-V70.0)  Orders: Venipuncture (69629) TLB-Lipid Panel (80061-LIPID) TLB-BMP (Basic Metabolic Panel-BMET) (80048-METABOL) TLB-CBC Platelet - w/Differential (85025-CBCD) TLB-Hepatic/Liver Function Pnl (80076-HEPATIC) TLB-TSH (Thyroid Stimulating Hormone) (84443-TSH)  Problem # 2:  BRONCHITIS-ACUTE (ICD-466.0)  Her updated medication list for this problem includes:    Ventolin Hfa 108 (90 Base) Mcg/act Aers (Albuterol sulfate) .Marland Kitchen... 1-2 puffs every 4-6 hrs as needed    Delsym 30 Mg/83ml Lqcr (Dextromethorphan polistirex) ..... Once  daily as needed    Azithromycin 250 Mg Tabs (Azithromycin) .Marland Kitchen... As per pack    Symbicort 80-4.5 Mcg/act Aero (Budesonide-formoterol fumarate) .Marland Kitchen... 1-2 puffs every 12 hrs ; gargle & spit after use  Problem # 3:  RHINITIS (ICD-477.9)  The following medications were removed from the medication list:    Fluticasone Propionate 50 Mcg/act Susp (Fluticasone propionate) ..... One spray each nostril daily as needed  Problem # 4:  GERD (ICD-530.81) stable  The following medications were removed from the medication list:    Pepcid 20 Mg Tabs (Famotidine) ..... One at beditme Her updated medication list for this problem includes:    Nexium 40 Mg Cpdr (Esomeprazole magnesium) ..... By mouth daily. take one half hour before eating.    Levsin/sl 0.125 Mg Subl (Hyoscyamine sulfate) .Marland Kitchen... Dissolve 1 tablet under tongue as neede for rectal pain  Problem # 5:  PULMONARY SARCOIDOSIS (ICD-135) PMH of  Problem # 6:  IBS (ICD-564.1)  Complete Medication List: 1)  Nexium 40 Mg Cpdr (Esomeprazole magnesium) .... By mouth daily. take one half hour before eating. 2)  Levsin/sl 0.125 Mg Subl (Hyoscyamine sulfate) .... Dissolve 1 tablet under tongue as neede for  rectal pain 3)  Ventolin Hfa 108 (90 Base) Mcg/act Aers (Albuterol sulfate) .Marland Kitchen.. 1-2 puffs every 4-6 hrs as needed 4)  Delsym 30 Mg/91ml Lqcr (Dextromethorphan polistirex) .... Once daily as needed 5)  Azithromycin 250 Mg Tabs (Azithromycin) .... As per pack 6)  Symbicort 80-4.5 Mcg/act Aero (Budesonide-formoterol fumarate) .Marland Kitchen.. 1-2 puffs every 12 hrs ; gargle & spit after use  Other Orders: Tdap => 15yrs IM (54098) Admin 1st Vaccine (11914)  Patient Instructions: 1)  Neti pot once daily - two times a day foillowed  by Flonase as needed .Drink as much NON dairy  fluid as you can tolerate for the next few days.  Prescriptions: SYMBICORT 80-4.5 MCG/ACT AERO (BUDESONIDE-FORMOTEROL FUMARATE) 1-2 puffs every 12 hrs ; gargle & spit after use  #1 x 0    Entered and Authorized by:   Marga Melnick MD   Signed by:   Marga Melnick MD on 09/05/2010   Method used:   Samples Given   RxID:   [id number] AZITHROMYCIN 250 MG TABS (AZITHROMYCIN) as per pack  #1 x 0   Entered and Authorized by:   Marga Melnick MD   Signed by:   Marga Melnick MD on 09/05/2010   Method used:   Faxed to ...       [facility] [phone]* (retail)       [address]       [place], [place]  [address]       Ph: [phone] or [phone]       Fax: [phone]   RxID:   [id number]    Orders Added: 1)  Tdap => 70yrs IM [90715] 2)  Admin 1st Vaccine [90471] 3)  Est. Patient 40-64 years [99396] 4)  Venipuncture [36415] 5)  TLB-Lipid Panel [80061-LIPID] 6)  TLB-BMP (Basic Metabolic Panel-BMET) [80048-METABOL] 7)  TLB-CBC Platelet - w/Differential [85025-CBCD] 8)  TLB-Hepatic/Liver Function Pnl [80076-HEPATIC] 9)  TLB-TSH (Thyroid Stimulating Hormone) [63016-[facility]]   Immunizations Administered:  Tetanus Vaccine:    Vaccine Type: Tdap    Site: right deltoid    Mfr: GlaxoSmithKline    Dose: 0.5 ml    Route: IM    Given by: Shonna Chock CMA    Exp. Date: 06/27/2012    Lot #: [id number]    VIS given: 07/26/08 version given September 05, 2010.   Immunizations Administered:  Tetanus Vaccine:    Vaccine Type: Tdap    Site: right deltoid    Mfr: GlaxoSmithKline    Dose: 0.5 ml    Route: IM    Given by: Shonna Chock CMA    Exp. Date: 06/27/2012    Lot #: [id number]    VIS given: 07/26/08 version given September 05, 2010.

## 2010-11-23 LAB — POCT HEMOGLOBIN-HEMACUE: Hemoglobin: 12.9 g/dL (ref 12.0–15.0)

## 2010-12-03 ENCOUNTER — Other Ambulatory Visit (HOSPITAL_COMMUNITY): Payer: Self-pay | Admitting: Orthopedic Surgery

## 2010-12-03 DIAGNOSIS — M25512 Pain in left shoulder: Secondary | ICD-10-CM

## 2010-12-06 ENCOUNTER — Ambulatory Visit (HOSPITAL_COMMUNITY)
Admission: RE | Admit: 2010-12-06 | Discharge: 2010-12-06 | Disposition: A | Discharge status: Home / Self Care | Payer: BC Managed Care – PPO | Source: Ambulatory Visit | Attending: Orthopedic Surgery | Admitting: Orthopedic Surgery

## 2010-12-06 DIAGNOSIS — M719 Bursopathy, unspecified: Secondary | ICD-10-CM | POA: Insufficient documentation

## 2010-12-06 DIAGNOSIS — M25512 Pain in left shoulder: Secondary | ICD-10-CM

## 2010-12-06 DIAGNOSIS — M25519 Pain in unspecified shoulder: Secondary | ICD-10-CM | POA: Insufficient documentation

## 2010-12-06 DIAGNOSIS — M67919 Unspecified disorder of synovium and tendon, unspecified shoulder: Secondary | ICD-10-CM | POA: Insufficient documentation

## 2010-12-06 MED ORDER — IOHEXOL 300 MG/ML  SOLN
10.0000 mL | Freq: Once | INTRAMUSCULAR | Status: AC | PRN
  Administered 2010-12-06: 10 mL

## 2010-12-12 ENCOUNTER — Other Ambulatory Visit (HOSPITAL_COMMUNITY): Payer: Self-pay | Admitting: Orthopedic Surgery

## 2010-12-12 DIAGNOSIS — M75101 Unspecified rotator cuff tear or rupture of right shoulder, not specified as traumatic: Secondary | ICD-10-CM

## 2010-12-17 ENCOUNTER — Ambulatory Visit (HOSPITAL_COMMUNITY)
Admission: RE | Admit: 2010-12-17 | Discharge: 2010-12-17 | Disposition: A | Discharge status: Home / Self Care | Payer: BC Managed Care – PPO | Source: Ambulatory Visit | Attending: Orthopedic Surgery | Admitting: Orthopedic Surgery

## 2010-12-17 DIAGNOSIS — M25519 Pain in unspecified shoulder: Secondary | ICD-10-CM | POA: Insufficient documentation

## 2010-12-17 DIAGNOSIS — M75101 Unspecified rotator cuff tear or rupture of right shoulder, not specified as traumatic: Secondary | ICD-10-CM

## 2010-12-17 MED ORDER — IOHEXOL 300 MG/ML  SOLN
10.0000 mL | Freq: Once | INTRAMUSCULAR | Status: AC | PRN
  Administered 2010-12-17: 10 mL

## 2011-01-14 ENCOUNTER — Telehealth: Payer: Self-pay | Admitting: *Deleted

## 2011-01-14 NOTE — Telephone Encounter (Signed)
Pt left msg on triage voicemail would like to have stress test and TSH checked.   Called pt to get more information left msg on machine.

## 2011-01-15 NOTE — Telephone Encounter (Signed)
Spoke w/ pt appt scheduled to see Dr. Alwyn Ren

## 2011-01-16 ENCOUNTER — Encounter: Payer: Self-pay | Admitting: Internal Medicine

## 2011-01-17 ENCOUNTER — Encounter: Payer: Self-pay | Admitting: Internal Medicine

## 2011-01-17 ENCOUNTER — Other Ambulatory Visit: Payer: Self-pay | Admitting: Internal Medicine

## 2011-01-17 ENCOUNTER — Ambulatory Visit (INDEPENDENT_AMBULATORY_CARE_PROVIDER_SITE_OTHER): Payer: BC Managed Care – PPO | Admitting: Internal Medicine

## 2011-01-17 DIAGNOSIS — J45909 Unspecified asthma, uncomplicated: Secondary | ICD-10-CM

## 2011-01-17 DIAGNOSIS — F411 Generalized anxiety disorder: Secondary | ICD-10-CM

## 2011-01-17 DIAGNOSIS — D869 Sarcoidosis, unspecified: Secondary | ICD-10-CM

## 2011-01-17 DIAGNOSIS — R635 Abnormal weight gain: Secondary | ICD-10-CM

## 2011-01-17 LAB — CBC WITH DIFFERENTIAL/PLATELET
Basophils Absolute: 0.2 10*3/uL — ABNORMAL HIGH (ref 0.0–0.1)
Basophils Relative: 5 % — ABNORMAL HIGH (ref 0–1)
Eosinophils Absolute: 0.3 10*3/uL (ref 0.0–0.7)
Eosinophils Relative: 5 % (ref 0–5)
MCH: 30.6 pg (ref 26.0–34.0)
MCHC: 33.9 g/dL (ref 30.0–36.0)
MCV: 90.3 fL (ref 78.0–100.0)
Platelets: 279 10*3/uL (ref 150–400)
RDW: 13.4 % (ref 11.5–15.5)

## 2011-01-17 MED ORDER — BUDESONIDE-FORMOTEROL FUMARATE 80-4.5 MCG/ACT IN AERO: 2.0000 | INHALATION_SPRAY | RESPIRATORY_TRACT | Status: DC

## 2011-01-17 NOTE — Progress Notes (Signed)
Addended by: Floydene Flock on: 01/17/2011 04:43 PM   Modules accepted: Orders

## 2011-01-17 NOTE — Progress Notes (Signed)
  Subjective:    Patient ID: [name], female    DOB: Jan 12, 1951, 60 y.o.   MRN: [id number]  HPI Dyspnea:progressive over past  2 years;  exacerbated by exercise & heat Cough:daily with paroxysms with exercise or when supine Sputum production:1/4 cup/ day Hemoptysis:no Dyspnea (rest/exertional/PND): occasionally even @ rest;+ PND , sleeping 2-4 hours @ a time Wheezing:yes Chest pain, edema, palpitations: edema occasionally Treatment/efficacy: Use of rescue inhaler:4X/week Use of maintenance inhaler:not on maintenance agent Smoking:quit 1983 Past medical history of Asthma, Allergies, & Sarcoidosis Family history pulmonary disease: no    Review of Systems Weight up 50-60 # since 07/11. No PMH of Thyroid dysfunction . Fatigue:yes;  Appetite:fair  Visual change(blurred/diplopia/visual loss):no Hoarseness: intermittently; Swallowing issues:occasional dysphagia since [facility] repair GI: Constipation:occasionally; Diarrhea:no Derm: Change in nails/hair/skin:no Neuro: Numbness/tingling:in hands occasionally; Tremor:no Psych: Anxiety:yes; Depression:no; Panic attacks:yes, using meditation Endo: Temperature intolerance: Heat:yes; Cold:no       Objective:   Physical Exam General appearance is of good health and nourishment; no acute distress or increased work of breathing is present.  No  lymphadenopathy about the head, neck, or axilla noted.   Eyes: No conjunctival inflammation or lid edema is present. There is no scleral icterus. Slight lid lag  Nose:  External nasal examination shows no deformity or inflammation. Nasal mucosa are pink and moist without lesions or exudates. No septal dislocation or dislocation.No obstruction to airflow.   Oral exam: Dental hygiene is good; lips and gums are healthy appearing.There is no oropharyngeal erythema or exudate noted.  Upper partial. Slightly hoarse  Neck:  No deformities, thyromegaly, masses, or tenderness noted.   Supple with full range of  motion without pain.   Heart:  Normal rate and regular rhythm. S1 and S2 normal without gallop, murmur, click, rub or other extra sounds.  S4  Lungs:Chest : harsh, homogenous musical  Wheezes, low grade  Rhonchi & ,rales . No rubs present.No increased work of breathing.    Extremities:  No cyanosis, edema, or clubbing  Noted. No onycholysis    Skin: Warm & dry w/o jaundice or tenting.   Neuro: Deep tendon reflexes were normal; no tremor       Assessment & Plan:  #1 dyspnea related to uncontrolled asthmatic bronchitis superimposed on past history of  sarcoidosis. She is not using  anti-inflammatory maintenance medications but rather relying on rescue agents. She plans rotator cuff surgery May 29. This should not be pursued until her reactive airways disease is well controlled to prevent postop complications. This was discussed.  #2 dramatic weight gain over the past 10 months; rule out hypothyroidism  #3 anxiety disorder with panic attacks; serotonin deficiency would be the pathophysiology. She didn't declines an SSRI at this time and will continue using meditation.

## 2011-01-17 NOTE — Patient Instructions (Signed)
Please share this record with the Anesthesiologist at the preop visit. Again  you should not pursue elective surgery until the reactive airways disease (asthma) is controlled to prevent perioperative complications.

## 2011-01-21 NOTE — Op Note (Signed)
[name], [name]              ACCOUNT NO.:  [id number]   MEDICAL RECORD NO.:  [id number]          PATIENT TYPE:  AMB   LOCATION:  [facility]                         FACILITY:  [facility]   PHYSICIAN:  Marlowe Kays, M.D.  DATE OF BIRTH:  November 01, 1950   DATE OF PROCEDURE:  11/21/2008  DATE OF DISCHARGE:                               OPERATIVE REPORT   PREOPERATIVE DIAGNOSIS:  Impingement syndrome, left shoulder, with  partial versus full-thickness rotator cuff tear.   POSTOPERATIVE DIAGNOSIS:  Impingement syndrome, left shoulder, with full-  thickness rotator cuff tear.   OPERATION:  1. Left shoulder arthroscopy with debridement of labrum and articular      surface of rotator cuff.  2. Arthroscopic subacromial decompression.  3. Open repair of rotator cuff tear.   SURGEON:  Marlowe Kays, M.D.   ASSISTANTDruscilla Brownie. Idolina Primer, PA-C.   ANESTHESIA:  General.   PATHOLOGY AND INDICATIONS FOR PROCEDURE:  She has had a history of an  open rotator cuff repair in her right shoulder, and because of the  painful left shoulder an MRI performed on November 11, 2008 demonstrated  what appeared to be an ill-defined tear of the anterior medial  supraspinatus associated with a type 2 acromion.  I had personally  reviewed the films and it did look like she had a full tear.  The  radiologist felt it was only about 6 mm x 6 mm in size.   PROCEDURE:  Satisfactory general anesthesia, beach chair position on the  sling frame, left shoulder girdle was prepped with DuraPrep, draped in a  sterile field.  Time-out performed.  Anatomy of the shoulder joint was  marked out and I infiltrated the subacromial space and posterior and  lateral portal sites with 0.5% Marcaine with adrenaline.  Through a  posterior soft spot portal, I atraumatically entered the glenohumeral  joint.  She had some fraying of the underneath surface of the rotator  cuff and a little bit of labral disruption at the biceps anchor.  Accordingly, I advanced the scope between the subscapularis and the  biceps tendon, and using a switching stick made an anterior incision,  placing a metal cannula over the switching stick.  I then placed a 4.2  shaver into the joint and debrided down both the labrum and the  underneath surface of the rotator cuff.  I then redirected the scope  into the subacromial space.  There was a large amount of bursal tissue  present as well as fatty tissue, and through a lateral portal and using  a 4.2 shaver I began evacuating this.  Bleeders were coagulated with the  90-degree ArthroCare vaporizer.  The rotator cuff was clearly roughened,  but no definite tear was initially noted.  With the vaporizer, I then  removed soft tissue from the underneath surface of the acromion and  followed this with a 4-mm oval bur, going back-and-forth between the  bur, vaporizer, and the 4.2 shaver until I had a nice wide decompression  and a nice clear field.  We then went ahead and inspected the rotator  cuff.  There clearly  was some disruption there, but it was not until we  found the biceps tendon exposed that it was clear that there was a full-  thickness tear, since this should not have been visible from the bursal  surface side.  Accordingly, I went ahead and gave her 500 mg of  vancomycin due to PENICILLIN allergy, and made an anterior incision  splitting the deltoid.  I had to do only minimal snipping of the  anterior acromion residual for exposure.  The tear was identified on top  of biceps tendon.  It was really a lot more extensive than the MRI would  have led [place] to believe, with at least 1 cm in width and 1 cm of  retraction.  Fortunately, the flap was very flexible, and I was able to  advance it to its anatomic position, stabilizing it there with a 4-  stranded rotator cuff anchor, with the strands placed from underneath  upward, and then back down and through the terminal portion of the  rotator cuff  and through the lateral soft tissue, cinching down the 2  suture strands tightly.  This seemed to give a nice stable repair.  I  took pictures of the tear and the repair.  I then irrigated the wound  well with sterile saline and infiltrated the 3 portals and the incision  with 0.5% Marcaine with adrenaline once again, closed the deltoid and  the fascia over the anterior acromion with interrupted #1 Vicryl,  subcutaneous tissue with 2-0 Vicryl.  Steri-Strips on the skin for the  incision and 4-0 nylon in the 3 portal sites.  Dry sterile dressing and  shoulder immobilizer were applied.  She tolerated the procedure well and  was taken to the recovery room in satisfactory condition with no known  complications.           ______________________________  Marlowe Kays, M.D.     JA/MEDQ  D:  11/21/2008  T:  11/22/2008  Job:  [id number]

## 2011-01-21 NOTE — Assessment & Plan Note (Signed)
[facility]                             PULMONARY OFFICE NOTE   FARON, WHITELOCK                     MRN:          [id number]  DATE:08/11/2007                            DOB:          1951/03/08    This is a pulmonary extended acute office visit.   HISTORY:  A 60 year old black female with remote history of sarcoid and  predominant upper airway symptoms that were felt to be rhinitis with  perhaps secondary reflux from coughing when I last saw her in August  2008.  I recommended that she continue to treat rhinitis aggressively  and consistently reasoning that she either had nonspecific or sarcoid  related rhinitis but in either case, nasal steroids would be  appropriate.  She apparently forgot these instructions and stopped the  Veramyst as soon as she felt better and comes back today acutely worse  over a three-week period of time with nasal congestion, hacking cough  productive of slightly yellow sputum and a sore throat mostly in the  mornings.  Although her instructions for rhinitis say that chronic  rhinitis can cause these exact symptoms, she does not remember reading  this (I showed her the sheet that she received last time and she did not  recognize it).  She denies any pleuritic pain, ocular complaints,  articular symptoms, fever, chills, sweats, or unintended weight loss.   PHYSICAL EXAMINATION:  VITAL SIGNS:  She is afebrile with stable vital  signs.  GENERAL APPEARANCE:  She is a pleasant, ambulatory black female with  nasal tone to her voice.  HEENT:  Remarkable for nonspecific turbinate edema, oropharynx is clear.  There is no evidence of excess erythema or post nasal drainage.  NECK:  Supple without cervical adenopathy or tenderness.  Trachea is  midline with no thyromegaly.  LUNGS:  Lung fields are completely clear to percussion and auscultation  bilaterally.  CARDIOVASCULAR:  There is regular rhythm without murmurs, rubs, or  gallops.  ABDOMEN:  Soft, benign.  EXTREMITIES:  Warm without calf tenderness, clubbing, cyanosis, or  edema.   IMPRESSION:  Poorly controlled chronic rhinitis now with an acute flare-  up perhaps with superimposed viral infection.  I am disappointed the  patient did not read the instructions more carefully regarding the use  of nasal steroids nor did she recognize the flare-up that occurred over  three weeks ago as attributable to rhinitis.   I therefore pulled out the old sheet and highlighted the areas that  pertained to her specifically outlining both the symptoms that she is  having now and the management of those symptoms as previously  recommended. This sheet emphasized the concept of controlling  inflammation with steroids which address the flame in inflammation  like water puts out a fire.   I recommended that she restart Veramyst (noting that it is also  available over-the-counter as fluticasone) and restart Prilosec until  100% better and then try eliminating it.  To treat her acutely, I  recommended also Doxycycline 100 mg b.i.d. for seven days because she  has discolored secretions and a sinus CT scan at  the end of seven days  if not improved.   The other option is to give a six-day course of prednisone to put out  the fire of upper airway inflammation.  She resisted this as in the  past but did not understand that nasal steroids can put out the fire  at a much lower dose of systemic drug if she will simply take the  medication as recommendation.   Follow-up can be p.r.n.  sinus disease, CT scan if not 100% back to  baseline.     Charlaine Dalton. Sherene Sires, MD, [facility]  Electronically Signed    MBW/MedQ  DD: 08/11/2007  DT: 08/12/2007  Job #: [id number]   cc:   Titus Dubin. Alwyn Ren, MD,FACP,FCCP

## 2011-01-21 NOTE — Assessment & Plan Note (Signed)
[facility]                             PULMONARY OFFICE NOTE   [name], [name]                     MRN:          [id number]  DATE:05/03/2007                            DOB:          June 25, 1951    HISTORY OF PRESENT ILLNESS:  A 60 year old black female who presented  with sarcoid in 1995 with mostly atypical joint aches and was seen here  on November 27 in followup for evaluation of what I thought was probably  rhinitis with question of sarcoid involvement because she did have an  elevated ACE level at 98.  She was much better on Veramyst b.i.d. and  tapered it down to 1 puff a day, but did not remember instructions  regarding flare up, and indeed comes in now with increasing nasal  congestion, low grade feverishness but no purulent secretions.  She does  have sensation of headache as well, but it is worse after sleeping.  She  denies any neurologic complaints, photophobia, significant myalgias or  arthralgias, cough or dyspnea.   PHYSICAL EXAMINATION:  GENERAL:  She is a pleasant elderly black female  who does not appear acutely ill.  VITAL SIGNS:  Stable.  HEAD AND NECK:  Reveals moderate nonspecific turbinate edema with no  ulceration or crusting.  She does have erythema but no cyanosis, pallor  or polyps.  Neck supple without cervical adenopathy or tenderness.  Face  is free of skin changes from sarcoid as are her extensor surfaces.  LUNGS:  Lung fields are perfectly clear bilaterally to auscultation and  percussion.  HEART:  Regular rhythm without murmur, gallop or rub.  ABDOMEN:  Soft, benign.  EXTREMITIES:  Warm without any calf tenderness, cyanosis, clubbing or  edema.   LABORATORY DATA:  Chest x-ray and lab work including ACE level pending  today.   IMPRESSION:  1. Chronic rhinitis is her most obvious problem and is not being      adequately addressed by the patient.  She has written instructions      on how to manage this more  aggressively, and I have asked her to      follow these.  Specifically she should use Afrin for the first five      days that she increases the Veramyst dose up to one b.i.d. and then      as she improves, then stop it; and then as she improves, reduce the      Veramyst down to one nightly maintenance dosing but not stop it.  2. Many of her other complaints appear nonspecific and related to poor      sleep hygiene which I have reviewed with her in detail as well.  3. I have offered her a short course of prednisone as a trial to see      to what extent she improves.  Many of her nonspecific complaints      with prednisone, but she is reluctant to do this having tolerated      it poorly in the past.   I spent extra time therefore trying to educate her regarding what  she  can expect from nonspecific rhinitis/sinusitis vs the typical sarcoid  flare being on the lookout for unexplained weight loss, saturating night  sweats, cough, dyspnea, ocular complaints or new skin rash.   If her nasal symptoms and headache do not improve with the above  regimen, the next step there would be to do a sinus and head CT scan.  Otherwise, pulmonary followup can be p.r.n.     Charlaine Dalton. Sherene Sires, MD, [facility]  Electronically Signed    MBW/MedQ  DD: 05/03/2007  DT: 05/04/2007  Job #: [id number]   cc:   Titus Dubin. Alwyn Ren, MD,FACP,FCCP

## 2011-01-24 NOTE — Assessment & Plan Note (Signed)
[facility]                             PULMONARY OFFICE NOTE   FERNANDO, TORRY                     MRN:          [id number]  DATE:08/04/2006                            DOB:          03-29-1951    PULMONARY EXTENDED FOLLOWUP OFFICE VISIT:   HISTORY:  The patient is a 60 year old black female who has carried the  diagnosis of sarcoid with concern of sarcoid rhinitis and also pulmonary  involvement, but typically improved with treatment related to rhinitis  and reflux, without the need for systemic steroids.  I had advised her  regarding optimal treatment of rhinitis on her previous visit on  November 31 with Veramyst, which she said helped some while she was  taking it, but was never completely better and now has worsened since  off.  She denies any purulent secretions, significant dyspnea, nocturnal  cough, fevers, chills, sweats, sinus pain, unintended weight-loss,  myalgias, arthralgias or rash.  Sinus drainage, associated with coughing  and excessive throat clearing, but no purulent sputum production and no  nocturnal exacerbations.   PAST MEDICAL HISTORY:  Significant for hiatal hernia, status post Nissen  fundal plication, May 2006, and remote cholecystectomy.   ALLERGIES:  PENICILLIN causes a rash.   MEDICATIONS:  She is completely out of Veramyst now, is taking over-the-  counter Afrin, against medical advice.  (She was given specific  instructions on how to use Afrin previously and it was not to use it on  a daily basis.)   SOCIAL HISTORY:  She quit smoking over 25 years ago.   FAMILY HISTORY:  Positive for allergies in her daughter, negative for  sarcoid or other respiratory disease.   REVIEW OF SYSTEMS:  Taken in detail and negative, except as above.   PHYSICAL EXAMINATION:  This is a pleasant, ambulatory black female, in  no acute distress.  The patient has normal vital signs.  HEENT reveals  moderate turbinate edema.   Oropharynx is clear.  The neck is supple,  without cervical adenopathy or tenderness.  Trachea is midline.  There  is no hepatosplenomegaly.  Lung fields are perfectly clear bilaterally  to auscultation and percussion.  Heart is regular rate and rhythm  without murmurs, gallops or rub.  Abdomen is soft, benign.  Extremities  are without calf tenderness, cyanosis or clubbing.   IMPRESSION:  1. Persistent symptoms of sinus drainage that partially improved on      Veramyst.  She now has worsening nasal congestion, I believe      associated with Afrin (rhinitis medicamentosa).  In addition, she      does have a history of reflux and has been coughing and clearing      her throat now enough to where she may be overcoming the previous      Nissen repair with active reflux while coughing.   RECOMMENDATIONS:  1. Add Prilosec 20 mg daily until better.  2. Prednisone 10 mg for 6-day therapy.  3. I spent 15-25 minutes going over strategy designed to treat chronic      rhinitis.  This is exactly the  information I went over before, as      documented in the records, both graphical and in text formatted      information regarding optimal treatment of rhinitis.  If she      follows instructions with Veramyst and does not have complete      relief, the next step is a sinus CT scan and I have given her the      phone number to call to schedule that appointment.  4. In the meantime, I have asked her to use Afrin for only five days      and use Mucinex-DM one to two b.i.d. p.r.n. cough and congestion.   1. No evidence of active sarcoid.  Therefore, no need for chronic      systemic steroids or for pulmonary followup.  In fact, ENT followup      would probably be next on the list, if she cannot control the      symptoms with topical therapy.     Charlaine Dalton. Sherene Sires, MD, [facility]  Electronically Signed    MBW/MedQ  DD: 08/05/2006  DT: 08/05/2006  Job #: [id number]   cc:   Titus Dubin. Alwyn Ren, MD,FACP,FCCP

## 2011-01-24 NOTE — Op Note (Signed)
NAMEGULIANA, [name]              ACCOUNT NO.:  [id number]   MEDICAL RECORD NO.:  [id number]          PATIENT TYPE:  AMB   LOCATION:  DAY                          FACILITY:  [facility]   PHYSICIAN:  Marlowe Kays, M.D.  DATE OF BIRTH:  April 30, 1951   DATE OF PROCEDURE:  07/15/2005  DATE OF DISCHARGE:                                 OPERATIVE REPORT   PREOPERATIVE DIAGNOSIS:  Torn rotator cuff right shoulder.   POSTOPERATIVE DIAGNOSIS:  Torn rotator cuff right shoulder.   OPERATION:  Anterior acromionectomy and repair of torn rotator cuff right  shoulder.   SURGEON:  Marlowe Kays, M.D.   ASSISTANT:  Mr. Idolina Primer, P.A.-C.   ANESTHESIA:  General.   PATHOLOGY AND JUSTIFICATION FOR PROCEDURE:  Painful right shoulder with MRI  demonstrating a large retracted tear. This was confirmed at surgery as  discussed below.   DESCRIPTION OF PROCEDURE:  Prophylactic antibiotics, satisfactory general  anesthesia, beach-chair position on the Schlein frame. The right shoulder  was prepped with DuraPrep, draped in a sterile field, Ioban employed. A  vertical midline incision from the [facility] joint down to the greater tuberosity.  The fascia over the anterior acromion was opened in line with the skin  incision. The [facility] joint was identified with a Mellody Dance needle. I undermined the  anterior acromion with a small Cobb elevator with joint fluid coming forth  confirming the tear. I then placed a larger Cobb on the underneath surface  of the acromion and marked off preserving the [facility] joint with a Bovie aligned  for anterior acromionectomy which I performed with a micro saw. She had a  fairly significant impingement problem and I made a few other passes with  the saw until the impingement was completely eradicated. The tear was  immediately apparent, it was quite large going from medial to the biceps  tendon which was intact to far posteriorly. The flap of rotator cuff was  layered with the underneath surface  retracted beneath the superior. It was  flexible enough that it could be brought to the greater tuberosity area.  After identifying the tear and deciding about my repair, I first brought the  two layers of the rotator cuff together and used two horizontal mattress  sutures of #1 Ethibond going from above down through and then back up above,  reconstituting the two layers into their normal anatomic relationship. I  then roughened the greater tuberosity area and used two Mitek anchors  bridging the tear and protecting the biceps tendon. I tied the two anchors  simultaneously with the arm abducted bringing the rotator cuff back to its  normal anatomic position. I then supplemented this using the remainder of  the sutures with multiple tack downs over the lateral portion of the rotator  cuff. This gave [place] what appeared to be a nice stable repair with her arm to  her side. I then irrigated the wound well with sterile saline and  infiltrated the soft tissues with 0.5% Marcaine with adrenaline. I then  closed the wound with #1 Vicryl sutures in two layers in the deltoid muscle.  I  made a small split in it, a #1 Vicryl in the fascia over the anterior  acromion with what appeared to be a good solid repair. I then closed the  subcutaneous tissue 2-0 Vicryl with Steri-Strips on the skin. Dry sterile  dressing and shoulder immobilizer were applied. She tolerated the procedure  well and was taken to recovery room in satisfactory condition with no known  complications.           ______________________________  Marlowe Kays, M.D.     JA/MEDQ  D:  07/15/2005  T:  07/15/2005  Job:  [id number]

## 2011-01-24 NOTE — Op Note (Signed)
[name], [name]              ACCOUNT NO.:  [id number]   MEDICAL RECORD NO.:  [id number]          PATIENT TYPE:  OIB   LOCATION:  1420                         FACILITY:  [facility]   PHYSICIAN:  Thornton Park. Daphine Deutscher, MD  DATE OF BIRTH:  04-04-1951   DATE OF PROCEDURE:  04/30/2005  DATE OF DISCHARGE:                                 OPERATIVE REPORT   PREOPERATIVE DIAGNOSIS:  Large type 3 mixed hiatal hernia.   POSTOPERATIVE DIAGNOSIS:  Large type 3 mixed hiatal hernia containing most  of stomach as well as colon.   SURGEON:  Luretha Murphy, MD   ASSISTANT:  Felicity Pellegrini, MD   ANESTHESIA:  General endotracheal.   DRAINS:  None.   OPERATIVE TIME:  2 hours.   DESCRIPTION OF PROCEDURE:  This 60 year old lady is brought to OR 1 on the  afternoon of April 30, 2005.  There was a 1 hour delay in getting started  since the hospital has been full, and they were holding people in the  recovery room.  After general anesthesia was administered, the abdomen was  prepped with Betadine and draped sterilely.  Access was gained to the  abdomen through the left upper quadrant using an OptiVu technique.  The  abdomen was then insufflated, and the 5 mm upper midline was used for the  [facility] retractor, two 11s on the right and another 11 just below the  umbilicus for the scope.  First, after reflecting the stomach, I grasped the  stomach and began pulling it out and found that the colon was entrapped up  into the hernia sac as well.  I reduced this in its entirety, leaving a very  large defect in the stomach and wanted to go back up inside the chest.  I  used a harmonic scalpel to carefully take down the hernia and then excised  the sac, freeing up the distal esophagus, enabling me to get the esophagus  down completely into the abdomen.  Penrose drain was placed posteriorly,  again to hold the traction.  Once I had this freed, I then repaired the  posterior crura with 3 pledgeted sutures.  One of  the needles broke off, and  it was a tiny little needle which was not able to be retrieved or found, but  the pledgeted sutures were tied down, and I closed the posterior portion of  the hiatus pretty well.  Anteriorly, a single pledgeted suture was used.  I  had used the lighted bougie to help identify the esophageal structure, and I  passed it on down into the stomach.  I then reached around and grasped the  stomach and then a contiguous fashion found an anterior portion of stomach  to plicate around a #50 lighted bougie.  With the lighted bougie in place, I  then sutured the wrap together with three endo suture 2-2 sutures, putting  clips on the uppermost knot.  Once those were tied,  the bougie was  withdrawn.  Hiatal hernia repair was intact, and the wrap was intact and was  viable.  There is no bleeding to  speak of.  And everything appeared dry at the end of the case.  I then  deflated the abdomen.  The wounds were closed with 4-0 Vicryl with Benzoin  and Steri-Strips.  The patient was taken to the recovery room in  satisfactory condition.      Thornton Park Daphine Deutscher, MD  Electronically Signed     MBM/MEDQ  D:  04/30/2005  T:  04/30/2005  Job:  [id number]   cc:   Lina Sar, M.D. [facility]  [address]  [place]  [place] [address]   Charlaine Dalton. Sherene Sires, M.D. [facility]  [address]  [place]  [place] [address]   Titus Dubin. Alwyn Ren, M.D. [facility]  [phone] [place]  [place] [address]

## 2011-01-24 NOTE — Assessment & Plan Note (Signed)
[facility]                               PULMONARY OFFICE NOTE   TASHENA, IBACH                     MRN:          [id number]  DATE:07/08/2006                            DOB:          Dec 05, 1950    CHIEF COMPLAINT:  Hemoptysis.   HISTORY:  This is a 60 year old black female with a history of  rhinitis/sinusitis and reflux, all mimicking symptoms of sarcoid, for which  she carries a diagnosis dating back to 45. But, typically with not needing  any form of chronic steroid therapy, but rather treatment directed at either  rhinitis or reflux or both. She was supposed to maintain herself on nasal  steroid after her last visit here in March, but failed to do so. She comes  in now with increasing congestion and congested cough over the last  several days associated with low-grade fever, but no rigors and traces of  hemoptysis this morning. She admits that she has had nasal congestion that  has flared for which she has not treated herself with any of her saline, but  denies any purulent nasal secretions or epistaxis.   In retrospect, she has had a little discomfort over her posterior chest,  right greater than left for the last month, worse with deep breathing or  coughing, but no chronic fevers, chills, sweats, weight loss, ocular  articular symptoms suggestive of systemic sarcoid.   PAST MEDICAL HISTORY:  Significant for:  1. Documented hiatal hernia (see most recent endoscopy June 31, 2006),      status post Nissen fundoplication May 2006.  2. She is status post remote cholecystectomy.   ALLERGIES:  PENICILLIN CAUSES A RASH.   MEDICATIONS:  On none regularly.   SOCIAL HISTORY:  She quit smoking over 25 years ago. She had previous worked  in [employer].   FAMILY HISTORY:  Positive for allergies in her daughter and negative for  sarcoid to her knowledge or cancer, asthma, or other respiratory disease.   REVIEW OF SYSTEMS:  Taken,  negative except as outlined above.   PHYSICAL EXAMINATION:  This is an animated black female in no acute  distress. She is afebrile and normal vital signs.  HEENT: Reveals mild turbinate edema. No evidence of purulent secretions or  significant cobblestoning or erythema of the oropharynx. Ear canals were  clear bilaterally. Dentition was intact. Neck was supple without cervical  adenopathy or tenderness. Trachea is __________  midline.  LUNGS:  Lung fields reveal minimal rhonchi bilaterally. Air movement is  adequate.  There is a regular rhythm without murmur, gallop or rub.  ABDOMEN: Soft and benign.  EXTREMITIES: Warm without calf tenderness, cyanosis, clubbing or edema.  SKIN: Was warm and dry with no lesions.  CHEST X-RAY: Continues to show evidence of bilateral fibrotic pattern with  slight hilar adenopathy. No definite acute change.   IMPRESSION:  Acute tracheal bronchitis in setting of chronic rhinitis with  now traces of hemoptysis. May represent nothing more than aspiration of post-  nasal drainage contents. Certainly, she does not have any evidence of active  sarcoid, although this is a  consideration and as is fibrocystic sarcoid  causing an bronchiectatic type pattern with the combination of chest pain,  coarse chronic infiltrates and hemoptysis.   RECOMMENDATIONS:  1. Is doxycycline 100 mg b.i.d. for seven days.  2. Veramyst (since she previously responded to fluticasone, she should      respond to Veramyst) one puff b.i.d. (instructions given regarding      optimal administration).  3. Flu vaccination today.  4. Restaging today with CMET, CBC, sed rate and ACE level are all pending.  5. Follow up in two weeks, sooner p.r.n.    ______________________________  Charlaine Dalton. Sherene Sires, MD, [facility]    MBW/MedQ  DD: 07/08/2006  DT: 07/08/2006  Job #: [id number]

## 2011-01-27 ENCOUNTER — Other Ambulatory Visit: Payer: Self-pay | Admitting: Orthopedic Surgery

## 2011-01-27 ENCOUNTER — Encounter (HOSPITAL_COMMUNITY): Payer: BC Managed Care – PPO | Attending: Orthopedic Surgery

## 2011-01-27 DIAGNOSIS — Z01812 Encounter for preprocedural laboratory examination: Secondary | ICD-10-CM | POA: Insufficient documentation

## 2011-01-27 DIAGNOSIS — X58XXXA Exposure to other specified factors, initial encounter: Secondary | ICD-10-CM | POA: Insufficient documentation

## 2011-01-27 DIAGNOSIS — S43429A Sprain of unspecified rotator cuff capsule, initial encounter: Secondary | ICD-10-CM | POA: Insufficient documentation

## 2011-01-27 LAB — SURGICAL PCR SCREEN: MRSA, PCR: NEGATIVE

## 2011-01-31 ENCOUNTER — Telehealth: Payer: Self-pay | Admitting: *Deleted

## 2011-01-31 MED ORDER — PREDNISONE 5 MG PO TABS: 5.0000 mg | ORAL_TABLET | Freq: Two times a day (BID) | ORAL | Status: AC

## 2011-01-31 NOTE — Telephone Encounter (Signed)
Pt is having surgery in June-- per [facility] call in Prednisone 5mg - 1 po bid. #14. Pt is aware.

## 2011-02-24 ENCOUNTER — Other Ambulatory Visit (HOSPITAL_COMMUNITY): Payer: BC Managed Care – PPO

## 2011-02-25 ENCOUNTER — Encounter (HOSPITAL_COMMUNITY): Payer: BC Managed Care – PPO

## 2011-02-25 ENCOUNTER — Other Ambulatory Visit: Payer: Self-pay | Admitting: Orthopedic Surgery

## 2011-02-25 ENCOUNTER — Ambulatory Visit (HOSPITAL_COMMUNITY)
Admission: RE | Admit: 2011-02-25 | Discharge: 2011-02-25 | Disposition: A | Discharge status: Home / Self Care | Payer: BC Managed Care – PPO | Source: Ambulatory Visit | Attending: Orthopedic Surgery | Admitting: Orthopedic Surgery

## 2011-02-25 ENCOUNTER — Other Ambulatory Visit (HOSPITAL_COMMUNITY): Payer: Self-pay | Admitting: Orthopedic Surgery

## 2011-02-25 DIAGNOSIS — M75102 Unspecified rotator cuff tear or rupture of left shoulder, not specified as traumatic: Secondary | ICD-10-CM

## 2011-02-25 DIAGNOSIS — S43429A Sprain of unspecified rotator cuff capsule, initial encounter: Secondary | ICD-10-CM | POA: Insufficient documentation

## 2011-02-25 DIAGNOSIS — D869 Sarcoidosis, unspecified: Secondary | ICD-10-CM

## 2011-02-25 DIAGNOSIS — J45909 Unspecified asthma, uncomplicated: Secondary | ICD-10-CM | POA: Insufficient documentation

## 2011-02-25 DIAGNOSIS — X58XXXA Exposure to other specified factors, initial encounter: Secondary | ICD-10-CM | POA: Insufficient documentation

## 2011-02-25 DIAGNOSIS — Z01812 Encounter for preprocedural laboratory examination: Secondary | ICD-10-CM | POA: Insufficient documentation

## 2011-02-25 DIAGNOSIS — Z01818 Encounter for other preprocedural examination: Secondary | ICD-10-CM | POA: Insufficient documentation

## 2011-02-25 LAB — CBC
HCT: 41 % (ref 36.0–46.0)
Hemoglobin: 13.3 g/dL (ref 12.0–15.0)
MCH: 29.6 pg (ref 26.0–34.0)
MCHC: 32.4 g/dL (ref 30.0–36.0)
MCV: 91.1 fL (ref 78.0–100.0)

## 2011-02-25 LAB — SURGICAL PCR SCREEN: Staphylococcus aureus: NEGATIVE

## 2011-03-07 ENCOUNTER — Ambulatory Visit (HOSPITAL_COMMUNITY)
Admission: AD | Admit: 2011-03-07 | Discharge: 2011-03-08 | DRG: 581 | Disposition: A | Discharge status: Home / Self Care | Payer: BC Managed Care – PPO | Source: Ambulatory Visit | Attending: Orthopedic Surgery | Admitting: Orthopedic Surgery

## 2011-03-07 DIAGNOSIS — Z79899 Other long term (current) drug therapy: Secondary | ICD-10-CM | POA: Insufficient documentation

## 2011-03-07 DIAGNOSIS — L910 Hypertrophic scar: Secondary | ICD-10-CM | POA: Diagnosis present

## 2011-03-07 DIAGNOSIS — Z01812 Encounter for preprocedural laboratory examination: Secondary | ICD-10-CM | POA: Insufficient documentation

## 2011-03-07 DIAGNOSIS — D869 Sarcoidosis, unspecified: Secondary | ICD-10-CM | POA: Insufficient documentation

## 2011-03-07 DIAGNOSIS — M719 Bursopathy, unspecified: Secondary | ICD-10-CM | POA: Diagnosis present

## 2011-03-07 DIAGNOSIS — J45909 Unspecified asthma, uncomplicated: Secondary | ICD-10-CM | POA: Diagnosis present

## 2011-03-07 DIAGNOSIS — M67919 Unspecified disorder of synovium and tendon, unspecified shoulder: Secondary | ICD-10-CM | POA: Diagnosis present

## 2011-03-20 NOTE — Op Note (Signed)
  NAMELIBRA, GATZ NO.:  [id number]  MEDICAL RECORD NO.:  [id number]  LOCATION:  DAYL                         FACILITY:  [facility]  PHYSICIAN:  Marlowe Kays, M.D.  DATE OF BIRTH:  1951/01/28  DATE OF PROCEDURE:  03/07/2011 DATE OF DISCHARGE:                              OPERATIVE REPORT   PREOPERATIVE DIAGNOSES: 1. Extensive keloid formation. 2. Recurrent rotator cuff tear, left shoulder.  POSTOPERATIVE DIAGNOSES: 1. Extensive keloid formation. 2. Recurrent rotator cuff tear, left shoulder.  OPERATION: 1. Scar revision, left shoulder. 2. Repair of small recurrent rotator cuff tear, left shoulder.  SURGEON:  Marlowe Kays, M.D.  ASSISTANTDruscilla Brownie. Underwood, P.A.C  ANESTHESIA:  General preceded by interscalene block.  PLAN/JUSTIFICATION FOR PROCEDURE:  Original repair was in March of 2010. Because of recurrent tear which was a large tear, I re-repaired her on March 26, 2010, with scar revision.  She has developed once again large keloid formation that are chronic raw areas in the wound.  She has had an arthrogram of her shoulder on December 06, 2010, which has shown a recurrent tear.  Consequently she is here for the above-mentioned surgery.  PROCEDURE:  Prophylactic antibiotics, satisfied general anesthesia, beach-chair position on this sliding frame, left shoulder girdle was prepped with DuraPrep and draped in sterile field.  Time-out performed. I first excised the large keloid and then worked my way down to the residual acromion and opened the fascia over it with cutting cautery exposing the anterior acromion.  There was no significant impingement. I did trim back at the level of the underlying soft tissue and bone for better visualization.  The recurrent tear was not immediately visible. Most of the rotator cuff appeared to be pristine in appearance.  The only area I could find that might have been in question just above the greater  tuberosity which did correlate with the arthrogram.  I went ahead and used some carmine indigo to inject the shoulder and the only leakage was from this one site.  I then went ahead and opened it 2 cm and repaired the area with multiple side-to-side suture of #1 Ethibond. The wound was irrigated with sterile saline and I then closed the fascia over the distal acromion and small slit in the deltoid with interrupted #1 Vicryl, the subcutaneous tissue with combination #1 and 2-0 Vicryl and some small staples in the skin.  Betadine, Adaptic, dry sterile dressing were applied followed by shoulder immobilizer.  She tolerated the procedure well, was taken to recovery room in satisfactory condition with no known complications.          ______________________________ Marlowe Kays, M.D.     JA/MEDQ  D:  03/07/2011  T:  03/07/2011  Job:  [id number]  Electronically Signed by Marlowe Kays M.D. on 03/20/2011 12:16:48 PM

## 2011-08-11 ENCOUNTER — Ambulatory Visit (INDEPENDENT_AMBULATORY_CARE_PROVIDER_SITE_OTHER): Payer: BC Managed Care – PPO | Admitting: Internal Medicine

## 2011-08-11 ENCOUNTER — Encounter: Payer: Self-pay | Admitting: Internal Medicine

## 2011-08-11 VITALS — BP 100/80 | HR 120 | Temp 98.6°F

## 2011-08-11 DIAGNOSIS — J45909 Unspecified asthma, uncomplicated: Secondary | ICD-10-CM

## 2011-08-11 MED ORDER — AZITHROMYCIN 250 MG PO TABS: ORAL_TABLET | ORAL | Status: AC

## 2011-08-11 MED ORDER — PREDNISONE 10 MG PO TABS: ORAL_TABLET | ORAL | Status: AC

## 2011-08-11 NOTE — Patient Instructions (Signed)
Rest, fluids , tylenol For cough, take Mucinex DM twice a day as needed  Prednisone as prescribed Take the antibiotic as prescribed ---->zithromax Albuterol 4 times a day as needed for wheezing  Call if no better in few days Call anytime if the symptoms are severe, you have high fever, increased shortness of breath

## 2011-08-11 NOTE — Progress Notes (Signed)
  Subjective:    Patient ID: [name], female    DOB: [birth year], 60 y.o.   MRN: [id number]  HPI Acute visit  60 year old patient with history of asthma, GERD and sarcoid who presents with a one-week history of cough, on-off low-grade temperature, increased chest congestion. She takes Symbicort regularly but is afraid of using ventolin "too much".  PMH-- reviewed   Review of Systems Mild nausea, no vomiting or diarrhea She is getting creamy sputum on and off, no hemoptysis Wheezing and shortness of breath above baseline.     Objective:   Physical Exam  Constitutional: She is oriented to person, place, and time. She appears well-developed and well-nourished.  HENT:  Head: Normocephalic and atraumatic.       Right ear with wax, left ear normal. Nose is slightly congested, throat without redness or discharge  Cardiovascular: Normal rate and regular rhythm.   No murmur heard. Pulmonary/Chest:       No increased work of breathing, few rhonchi that clear with cough, bilateral  Wheezing (mild)  Musculoskeletal: She exhibits no edema.  Neurological: She is alert and oriented to person, place, and time.      Assessment & Plan:

## 2011-08-11 NOTE — Assessment & Plan Note (Signed)
Presents with mild asthma exacerbation probably due to bronchitis. See instructions. Of note this is that she discontinue PPIs a while back, apparently it was prescribed for cough but she did not note a improvement of the cough with nexium thus self d/c

## 2012-01-19 ENCOUNTER — Encounter: Payer: Self-pay | Admitting: *Deleted

## 2012-02-04 ENCOUNTER — Encounter: Payer: Self-pay | Admitting: Internal Medicine

## 2012-02-04 ENCOUNTER — Ambulatory Visit (INDEPENDENT_AMBULATORY_CARE_PROVIDER_SITE_OTHER): Payer: BC Managed Care – PPO | Admitting: Internal Medicine

## 2012-02-04 VITALS — BP 112/66 | HR 67 | Ht 65.0 in | Wt 193.0 lb

## 2012-02-04 DIAGNOSIS — K589 Irritable bowel syndrome without diarrhea: Secondary | ICD-10-CM

## 2012-02-04 MED ORDER — ALIGN 4 MG PO CAPS: 1.0000 | ORAL_CAPSULE | Freq: Every day | ORAL | Status: DC

## 2012-02-04 MED ORDER — DICYCLOMINE HCL 10 MG PO CAPS: 10.0000 mg | ORAL_CAPSULE | Freq: Two times a day (BID) | ORAL | Status: DC

## 2012-02-04 MED ORDER — PSYLLIUM 28 % PO PACK: 1.0000 | PACK | Freq: Every day | ORAL | Status: DC

## 2012-02-04 NOTE — Patient Instructions (Signed)
We have sent the following medications to your pharmacy for you to pick up at your convenience: Bentyl Please purchase Metamucil over the counter. Take as directed. We have given you samples of Align. This puts good bacteria back into your colon. You should take 1 capsule by mouth once daily. If this works well for you, it can be purchased over the counter. CC: Dr Marga Melnick

## 2012-02-04 NOTE — Progress Notes (Signed)
[name] 06/17/1951 MRN [id number]        History of Present Illness:  This is a 61 year old African American female with history of irritable bowel syndrome and gastroesophageal reflux. For past several months she has been experiencing crampy abdominal pain and frequent bowel movements. She complains of gas and bloating with occasional constipation. She has lactose intolerance. Last colonoscopy in March 2006  was normal. She has a history of Nissen fundoplication in 2006 for large hiatal hernia. Repeat upper endoscopy in July 2011 showed functioning fundoplication. Additional problems is sarcoidosis and asthmatic bronchitis   Past Medical History  Diagnosis Date  . GERD (gastroesophageal reflux disease)   . Barrett esophagus   . Pulmonary sarcoidosis   . Hiatal hernia   . Asthma   . Migraines   . IBS (irritable bowel syndrome)   . Anxiety    Past Surgical History  Procedure Date  . Cholecystectomy   . Rotator cuff repair     x3(2 on L, 1 on R)  . Cataract extraction, bilateral   . Bronchial biopsy 1996  . Nissen fundoplication     reports that she quit smoking about 33 years ago. She does not have any smokeless tobacco history on file. She reports that she drinks alcohol. She reports that she does not use illicit drugs. family history is not on file.  She is adopted. Allergies  Allergen Reactions  . Aspirin   . Penicillins         Review of Systems: Denies dysphagia chest pain shortness of breath  The remainder of the 10 point ROS is negative except as outlined in H&P   Physical Exam: General appearance  Well developed, in no distress. Mildly overweight Eyes- non icteric. HEENT nontraumatic, normocephalic. Mouth no lesions, tongue papillated, no cheilosis. Neck supple without adenopathy, thyroid not enlarged, no carotid bruits, no JVD. Lungs Clear to auscultation bilaterally. Cor normal S1, normal S2, regular rhythm, no murmur,  quiet  precordium. Abdomen: Soft nontender with normoactive bowel sounds. No tympany. No distention. Liver edge at costal margin. Rectal: Soft Hemoccult negative stool. Extremities no pedal edema. Skin no lesions. Neurological alert and oriented x 3. Psychological normal mood and affect.  Assessment and Plan:  Problem #1 Bloating alternating with diarrhea and constipation is consistent with irritable bowel syndrome and/or bacterial overgrowth. I advised patient to increase fiber in her diet and gave her samples of Metamucil to take 1 teaspoon daily. She will also take samples of a probiotic, one a day and Bentyl 10 mg twice a day. If her symptoms do not improve in the next 4-6 weeks, I suggest a repeat colonoscopy.  Problem #2 Gastroesophageal reflux for which patient is status post Nissen fundoplication in 2006. She is doing well from that standpoint.   02/04/2012 Lina Sar

## 2012-02-05 ENCOUNTER — Encounter: Payer: Self-pay | Admitting: Internal Medicine

## 2012-07-29 ENCOUNTER — Ambulatory Visit (INDEPENDENT_AMBULATORY_CARE_PROVIDER_SITE_OTHER): Payer: BC Managed Care – PPO | Admitting: Internal Medicine

## 2012-07-29 ENCOUNTER — Encounter: Payer: Self-pay | Admitting: Internal Medicine

## 2012-07-29 VITALS — BP 118/82 | HR 107 | Temp 98.3°F | Wt 195.0 lb

## 2012-07-29 DIAGNOSIS — J45909 Unspecified asthma, uncomplicated: Secondary | ICD-10-CM

## 2012-07-29 DIAGNOSIS — F411 Generalized anxiety disorder: Secondary | ICD-10-CM

## 2012-07-29 DIAGNOSIS — Z23 Encounter for immunization: Secondary | ICD-10-CM

## 2012-07-29 MED ORDER — ALBUTEROL SULFATE HFA 108 (90 BASE) MCG/ACT IN AERS: INHALATION_SPRAY | RESPIRATORY_TRACT | Status: DC

## 2012-07-29 MED ORDER — LORAZEPAM 0.5 MG PO TABS: ORAL_TABLET | ORAL | Status: DC

## 2012-07-29 MED ORDER — BUDESONIDE-FORMOTEROL FUMARATE 160-4.5 MCG/ACT IN AERO: INHALATION_SPRAY | RESPIRATORY_TRACT | Status: DC

## 2012-07-29 MED ORDER — CITALOPRAM HYDROBROMIDE 20 MG PO TABS: 20.0000 mg | ORAL_TABLET | Freq: Every day | ORAL | Status: DC
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
## 2012-07-29 NOTE — Assessment & Plan Note (Signed)
Meds will be refilled

## 2012-07-29 NOTE — Assessment & Plan Note (Signed)
Citalopram will be initiated with as needed low-dose lorazepam. I recommended that she contact her insurance company to see which psychologist are covered. Biofeedback would be very valuable in treating the panic attacks.

## 2012-07-29 NOTE — Patient Instructions (Addendum)
Please review the mental health benefits available to you; this will be  on the back of your insurance card as a [id number] [phone].    If you activate My Chart; the results can be released to you as soon as they populate from the lab. If you choose not to use this program; the labs have to be reviewed, copied & mailed   causing a delay in getting the results to you.

## 2012-07-30 ENCOUNTER — Ambulatory Visit: Payer: BC Managed Care – PPO | Admitting: Internal Medicine

## 2012-09-22 ENCOUNTER — Encounter: Payer: Self-pay | Admitting: Lab

## 2012-09-23 ENCOUNTER — Ambulatory Visit (INDEPENDENT_AMBULATORY_CARE_PROVIDER_SITE_OTHER): Payer: BC Managed Care – PPO | Admitting: Internal Medicine

## 2012-09-23 ENCOUNTER — Ambulatory Visit (INDEPENDENT_AMBULATORY_CARE_PROVIDER_SITE_OTHER)
Admission: RE | Admit: 2012-09-23 | Discharge: 2012-09-23 | Disposition: A | Discharge status: Home / Self Care | Payer: BC Managed Care – PPO | Source: Ambulatory Visit | Attending: Internal Medicine | Admitting: Internal Medicine

## 2012-09-23 ENCOUNTER — Encounter: Payer: Self-pay | Admitting: Internal Medicine

## 2012-09-23 VITALS — BP 114/72 | HR 93 | Temp 98.5°F | Wt 191.0 lb

## 2012-09-23 DIAGNOSIS — J411 Mucopurulent chronic bronchitis: Secondary | ICD-10-CM

## 2012-09-23 DIAGNOSIS — J45901 Unspecified asthma with (acute) exacerbation: Secondary | ICD-10-CM

## 2012-09-23 DIAGNOSIS — D869 Sarcoidosis, unspecified: Secondary | ICD-10-CM

## 2012-09-23 DIAGNOSIS — F411 Generalized anxiety disorder: Secondary | ICD-10-CM

## 2012-09-23 MED ORDER — PREDNISONE 20 MG PO TABS: 20.0000 mg | ORAL_TABLET | Freq: Two times a day (BID) | ORAL | Status: DC

## 2012-09-23 MED ORDER — LORAZEPAM 0.5 MG PO TABS: ORAL_TABLET | ORAL | Status: DC

## 2012-09-23 MED ORDER — DOXYCYCLINE HYCLATE 100 MG PO TABS: 100.0000 mg | ORAL_TABLET | Freq: Two times a day (BID) | ORAL | Status: DC

## 2012-09-23 NOTE — Patient Instructions (Addendum)
Order for x-rays entered into  the computer; these will be performed at [address] [facility] across from [facility]. No appointment is necessary. Plain Mucinex (NOT D) for thick secretions ;force NON dairy fluids .   Nasal cleansing in the shower as discussed with lather of mild shampoo.After 10 seconds wash off lather while  exhaling through nostrils. Make sure that all residual soap is removed to prevent irritation.  Use a Neti pot daily only  as needed for significant sinus congestion; going from open side to congested side . Plain Allegra (NOT D )  160 daily , Loratidine 10 mg , OR Zyrtec 10 mg @ bedtime  as needed for itchy eyes & sneezing.

## 2012-09-23 NOTE — Progress Notes (Signed)
  Subjective:    Patient ID: [name], female    DOB: 02/26/1951, 62 y.o.   MRN: [id number]  HPI The respiratory tract symptoms began 09/03/12  as sore throat followed by increased cough with  yellow  Sputum. Hoarseness as of 09/19/12  Significant associated symptoms include paranasal pain w/o nasal purulence Fever  Is low grade; some nocturnal chills and sweats  present   Cough was associated with  shortness of breath and wheezing .      Treatment with  Rescue MDI  & Mucinex was partially effective  There is  history of asthma since teens. Sarcoid lung disease.The patient had  quit smoking in 1973                  Review of Systems Symptoms not present  include frontal headache, facial pain, dental pain, sore throat,  earache , and otic discharge  She describes chronic unrelated dry eyes.  Itchy , watery eyes & sneezing were not noted.       Objective:   Physical Exam  General appearance:good health ;well nourished; no acute distress or increased work of breathing is present.  No  lymphadenopathy about the head, neck, or axilla noted.  Eyes: No conjunctival inflammation or lid edema is present.  Ears:  External ear exam shows no significant lesions or deformities.  Otoscopic examination reveals wax bilaterally Nose:  External nasal examination shows no deformity or inflammation. Nasal mucosa are dry without lesions or exudates. No septal dislocation or deviation.No obstruction to airflow.  Oral exam: Dental hygiene is good; lips and gums are healthy appearing.There is no oropharyngeal erythema or exudate noted.  Neck:  No deformities, thyromegaly, masses, or tenderness noted.    Heart:  Normal rate and regular rhythm. S1 and S2 normal without gallop, murmur, click, rub S4 with slurring at  LSB with slurring. No  extra sounds.  Lungs:asymmetric (R > L ) muscal  Wheezes & rhonchi present.No increased work of breathing.   Extremities:  No cyanosis or edema.  Subtle clubbing  noted  Skin: Warm & dry         Assessment & Plan:  #1 bronchitis with purulent secretions superimposed on history pulmonary sarcoidosis and reactive airways disease. Asymmetry of bronchospasm warrants imaging.  Plan: See orders/ recommendations

## 2012-10-14 ENCOUNTER — Ambulatory Visit: Payer: BC Managed Care – PPO | Admitting: Internal Medicine

## 2012-10-15 ENCOUNTER — Ambulatory Visit (INDEPENDENT_AMBULATORY_CARE_PROVIDER_SITE_OTHER): Payer: BC Managed Care – PPO | Admitting: Internal Medicine

## 2012-10-15 ENCOUNTER — Encounter: Payer: Self-pay | Admitting: Internal Medicine

## 2012-10-15 DIAGNOSIS — M545 Low back pain, unspecified: Secondary | ICD-10-CM

## 2012-10-15 DIAGNOSIS — M542 Cervicalgia: Secondary | ICD-10-CM

## 2012-10-15 MED ORDER — CYCLOBENZAPRINE HCL 5 MG PO TABS: ORAL_TABLET | ORAL | Status: DC

## 2012-10-15 MED ORDER — TRAMADOL HCL 50 MG PO TABS: 50.0000 mg | ORAL_TABLET | Freq: Four times a day (QID) | ORAL | Status: DC | PRN

## 2012-10-15 NOTE — Patient Instructions (Addendum)
Use an anti-inflammatory cream such as Aspercreme or Zostrix cream twice a day to the sore areas as needed. In lieu of this warm moist compresses or  hot water bottle can be used. Do not apply ice .   The best exercises for the low back include freestyle swimming, stretch aerobics, and yoga.  Chiropractory or physical therapy can help relieve the pain & increase mobility.
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
## 2012-10-20 ENCOUNTER — Encounter: Payer: Self-pay | Admitting: Internal Medicine

## 2013-02-23 ENCOUNTER — Encounter: Payer: Self-pay | Admitting: Internal Medicine

## 2013-06-20 ENCOUNTER — Other Ambulatory Visit (HOSPITAL_COMMUNITY): Payer: Self-pay | Admitting: Obstetrics

## 2013-06-20 DIAGNOSIS — Z1231 Encounter for screening mammogram for malignant neoplasm of breast: Secondary | ICD-10-CM

## 2013-07-01 ENCOUNTER — Ambulatory Visit (HOSPITAL_COMMUNITY)
Admission: RE | Admit: 2013-07-01 | Discharge: 2013-07-01 | Disposition: A | Discharge status: Home / Self Care | Payer: BC Managed Care – PPO | Source: Ambulatory Visit | Attending: Obstetrics | Admitting: Obstetrics

## 2013-07-01 DIAGNOSIS — Z1231 Encounter for screening mammogram for malignant neoplasm of breast: Secondary | ICD-10-CM

## 2013-07-04 LAB — PROCEDURE REPORT - SCANNED: Pap: NEGATIVE

## 2013-07-11 ENCOUNTER — Encounter: Payer: Self-pay | Admitting: Internal Medicine

## 2013-07-11 ENCOUNTER — Ambulatory Visit (INDEPENDENT_AMBULATORY_CARE_PROVIDER_SITE_OTHER)
Admission: RE | Admit: 2013-07-11 | Discharge: 2013-07-11 | Disposition: A | Discharge status: Home / Self Care | Payer: BC Managed Care – PPO | Source: Ambulatory Visit | Attending: Internal Medicine | Admitting: Internal Medicine

## 2013-07-11 ENCOUNTER — Ambulatory Visit (INDEPENDENT_AMBULATORY_CARE_PROVIDER_SITE_OTHER): Payer: BC Managed Care – PPO | Admitting: Internal Medicine

## 2013-07-11 VITALS — BP 132/80 | HR 114 | Temp 98.1°F | Ht 63.0 in | Wt 204.0 lb

## 2013-07-11 DIAGNOSIS — R05 Cough: Secondary | ICD-10-CM

## 2013-07-11 DIAGNOSIS — J45909 Unspecified asthma, uncomplicated: Secondary | ICD-10-CM

## 2013-07-11 DIAGNOSIS — Z23 Encounter for immunization: Secondary | ICD-10-CM

## 2013-07-11 DIAGNOSIS — R059 Cough, unspecified: Secondary | ICD-10-CM

## 2013-07-11 DIAGNOSIS — D869 Sarcoidosis, unspecified: Secondary | ICD-10-CM

## 2013-07-11 MED ORDER — BUDESONIDE-FORMOTEROL FUMARATE 160-4.5 MCG/ACT IN AERO: INHALATION_SPRAY | RESPIRATORY_TRACT | Status: DC

## 2013-07-11 MED ORDER — PREDNISONE (PAK) 10 MG PO TABS: ORAL_TABLET | ORAL | Status: DC

## 2013-07-11 NOTE — Progress Notes (Signed)
Subjective:    Patient ID: [name], female    DOB: 04/08/1951 MRN: [id number]  HPI  77 yobf quit smoking in 1980 and dx with sarcoidosis in 1996 but free of evidence of active sarcoid off steroids when last seen in pulmonary clinic in 04/2010 with predominantly upper airway cough  07/11/2013    Chief Complaint  Patient presents with  . Pulmonary Consult    Pt last seen here in 2009- referred back per Dr. Frederik Pear request. Pt c/o cough for 2 yrs and SOB on and off for the past 6 months.  Cough is occ prod with very minimal yellow sputum.  She states that she gets out of breath walking minimal distances such as from room to room at home and from parking lot to building today.   always better p prednisone but 2 weeks later  symptoms resume, last time was Feb 2014 and gradual onset progressive symptoms since then of  Cough = sob, better p saba,  Cough seems worse p supper, does not typically wake her up.   Only using rescue once or twice weekly and says symbicort works when she takes it.  Has h/o GERD / Barrett's denies being told to stay on ppi  No obvious day to day or daytime variabilty or   cp or chest tightness, subjective wheeze overt sinus or hb symptoms. No unusual exp hx or h/o childhood pna/ asthma or knowledge of premature birth.  Sleeping ok without nocturnal  or early am exacerbation  of respiratory  c/o's or need for noct saba. Also denies any obvious fluctuation of symptoms with weather or environmental changes or other aggravating or alleviating factors except as outlined above   Current Medications, Allergies, Complete Past Medical History, Past Surgical History, Family History, and Social History were reviewed in [employer] record.  .      Review of Systems  Constitutional: Negative for fever, chills and unexpected weight change.  HENT: Positive for dental problem. Negative for congestion, ear pain, nosebleeds, postnasal drip, rhinorrhea,  sinus pressure, sneezing, sore throat, trouble swallowing and voice change.   Eyes: Negative for visual disturbance.  Respiratory: Positive for cough and shortness of breath. Negative for choking.   Cardiovascular: Negative for chest pain and leg swelling.  Gastrointestinal: Negative for vomiting, abdominal pain and diarrhea.  Genitourinary: Negative for difficulty urinating.  Musculoskeletal: Negative for arthralgias.  Skin: Negative for rash.  Neurological: Negative for tremors, syncope and headaches.  Hematological: Does not bruise/bleed easily.       Objective:   Physical Exam  amb bf nad Wt Readings from Last 3 Encounters:  07/11/13 204 lb (92.534 kg)  10/15/12 193 lb (87.544 kg)  09/23/12 191 lb (86.637 kg)     HEENT: nl dentition, turbinates, and orophanx. Nl external ear canals without cough reflex   NECK :  without JVD/Nodes/TM/ nl carotid upstrokes bilaterally   LUNGS: no acc muscle use, insp < exp coarse rhonchi bilaterally   CV:  RRR  no s3 or murmur or increase in P2, no edema   ABD:  soft and nontender with nl excursion in the supine position. No bruits or organomegaly, bowel sounds nl  MS:  warm without deformities, calf tenderness, cyanosis or clubbing  SKIN: warm and dry without lesions    NEURO:  alert, approp, no deficits    CXR  07/11/2013 :  There is chronic reticulonodular interstitial prominence bilaterally probable due to sarcoid and fibrotic changes. There is slight worsening  in aeration in right infrahilar region with streaky opacity. Superimposed infiltrate cannot be excluded. Clinical correlation is necessary.     Assessment & Plan:

## 2013-07-11 NOTE — Patient Instructions (Addendum)
Prednisone 10 mg take  4 each am x 2 days,   2 each am x 2 days,  1 each am x 2 days and stop   Symbicort 160 Take 2 puffs first thing in am and then another 2 puffs about 12 hours later.   Work on inhaler technique:  relax and gently blow all the way out then take a nice smooth deep breath back in, triggering the inhaler at same time you start breathing in.  Hold for up to 5 seconds if you can.  Rinse and gargle with water when done   If your mouth or throat starts to bother you,   I suggest you time the inhaler to your dental care and after using the inhaler(s) brush teeth and tongue with a baking soda containing toothpaste and when you rinse this out, gargle with it first to see if this helps your mouth and throat.      Only use your albuterol (ventolin) as a rescue medication to be used if you can't catch your breath by resting or doing a relaxed purse lip breathing pattern.  - The less you use it, the better it will work when you need it. - Ok to use up to every 4 hours if you must but call for immediate appointment if use goes up over your usual need - Don't leave home without it !!  (think of it like your spare tire for your car)   GERD (REFLUX)  is an extremely common cause of respiratory symptoms, many times with no significant heartburn at all.    It can be treated with medication, but also with lifestyle changes including avoidance of late meals, excessive alcohol, smoking cessation, and avoid fatty foods, chocolate, peppermint, colas, red wine, and acidic juices such as orange juice.  NO MINT OR MENTHOL PRODUCTS SO NO COUGH DROPS  USE SUGARLESS CANDY INSTEAD (jolley ranchers or Stover's)  NO OIL BASED VITAMINS - use powdered substitutes.    Please remember to go to the  x-ray department downstairs for your tests - we will call you with the results when they are available.  Please schedule a follow up office visit in 6 weeks, call sooner if needed with pfts  add :  Need to sort  out ? Of Barrett's and whether she needs lifetime ppi/ gi f/u

## 2013-07-11 NOTE — Assessment & Plan Note (Addendum)
DDX of  difficult airways managment all start with A and  include Adherence, Ace Inhibitors, Acid Reflux, Active Sinus Disease, Alpha 1 Antitripsin deficiency, Anxiety masquerading as Airways dz,  ABPA,  allergy(esp in young), Aspiration (esp in elderly), Adverse effects of DPI,  Active smokers, plus two Bs  = Bronchiectasis and Beta blocker use..and one C= CHF  Adherence is always the initial "prime suspect" and is a multilayered concern that requires a "trust but verify" approach in every patient - starting with knowing how to use medications, especially inhalers, correctly, keeping up with refills and understanding the fundamental difference between maintenance and prns vs those medications only taken for a very short course and then stopped and not refilled. The proper method of use, as well as anticipated side effects, of a metered-dose inhaler are discussed and demonstrated to the patient. Improved effectiveness after extensive coaching during this visit to a level of approximately  75% so continue symbicort  ? Acid (or non-acid) GERD > always difficult to exclude as up to 75% of pts in some series report no assoc GI/ Heartburn symptoms> rec   diet restrictions/ reviewed and instructions given in writting     Each maintenance medication was reviewed in detail including most importantly the difference between maintenance and as needed and under what circumstances the prns are to be used.  Please see instructions for details which were reviewed in writing and the patient given a copy.

## 2013-07-12 NOTE — Progress Notes (Signed)
Quick Note:  Spoke with pt and notified of results per Dr. Wert. Pt verbalized understanding and denied any questions.  ______ 

## 2013-07-13 NOTE — Assessment & Plan Note (Signed)
No evidence of active dz, though I suppose could have airway involvement but would not treat differently even if she did.

## 2013-07-13 NOTE — Assessment & Plan Note (Addendum)
The most common causes of chronic cough in immunocompetent adults include the following: upper airway cough syndrome (UACS), previously referred to as postnasal drip syndrome (PNDS), which is caused by variety of rhinosinus conditions; (2) asthma; (3) GERD; (4) chronic bronchitis from cigarette smoking or other inhaled environmental irritants; (5) nonasthmatic eosinophilic bronchitis; and (6) bronchiectasis.   These conditions, singly or in combination, have accounted for up to 94% of the causes of chronic cough in prospective studies.   Other conditions have constituted no >6% of the causes in prospective studies These have included bronchogenic carcinoma, chronic interstitial pneumonia, sarcoidosis, left ventricular failure, ACEI-induced cough, and aspiration from a condition associated with pharyngeal dysfunction.    Chronic cough is often simultaneously caused by more than one condition. A single cause has been found from 38 to 82% of the time, multiple causes from 18 to 62%. Multiply caused cough has been the result of three diseases up to 42% of the time.       Most likely this is either cough variant asthma or Classic Upper airway cough syndrome, so named because it's frequently impossible to sort out how much is  CR/sinusitis with freq throat clearing (which can be related to primary GERD)   vs  causing  secondary (" extra esophageal")  GERD from wide swings in gastric pressure that occur with throat clearing, often  promoting self use of mint and menthol lozenges that reduce the lower esophageal sphincter tone and exacerbate the problem further in a cyclical fashion.   These are the same pts (now being labeled as having "irritable larynx syndrome" by some cough centers) who not infrequently have a history of having failed to tolerate ace inhibitors,  dry powder inhalers or biphosphonates or report having atypical reflux symptoms that don't respond to standard doses of PPI , and are easily  confused as having aecopd or asthma flares by even experienced allergists/ pulmonologists.  For now rx gerd diet and symbicort then regroup with pfts

## 2013-08-22 ENCOUNTER — Ambulatory Visit (INDEPENDENT_AMBULATORY_CARE_PROVIDER_SITE_OTHER): Payer: BC Managed Care – PPO | Admitting: Internal Medicine

## 2013-08-22 ENCOUNTER — Encounter: Payer: Self-pay | Admitting: Internal Medicine

## 2013-08-22 VITALS — BP 112/70 | HR 102 | Temp 98.2°F | Ht 64.0 in | Wt 204.0 lb

## 2013-08-22 DIAGNOSIS — J45909 Unspecified asthma, uncomplicated: Secondary | ICD-10-CM

## 2013-08-22 DIAGNOSIS — K2270 Barrett's esophagus without dysplasia: Secondary | ICD-10-CM

## 2013-08-22 DIAGNOSIS — D869 Sarcoidosis, unspecified: Secondary | ICD-10-CM

## 2013-08-22 LAB — PULMONARY FUNCTION TEST
DL/VA % pred: 73 %
FEF2575-%Change-Post: -10 %
FEV1-%Change-Post: -3 %
FEV1-%Pred-Post: 100 %
FEV1-%Pred-Pre: 103 %
FEV1-Post: 1.89 L
FEV1-Pre: 1.96 L
FEV1FVC-%Change-Post: -1 %
FEV1FVC-%Pred-Pre: 106 %
FEV6FVC-%Pred-Pre: 104 %
FVC-%Change-Post: -1 %
FVC-%Pred-Pre: 97 %
Post FEV1/FVC ratio: 82 %
Pre FEV6/FVC Ratio: 100 %
RV % pred: 69 %
RV: 1.42 L
TLC % pred: 90 %
TLC: 4.41 L

## 2013-08-22 MED ORDER — FAMOTIDINE 20 MG PO TABS: ORAL_TABLET | ORAL | Status: DC

## 2013-08-22 MED ORDER — PANTOPRAZOLE SODIUM 40 MG PO TBEC: 40.0000 mg | DELAYED_RELEASE_TABLET | Freq: Every day | ORAL | Status: DC

## 2013-08-22 NOTE — Assessment & Plan Note (Signed)
Pfts 08/22/2013 nl lung vols,  dlco 53 corrects to 73%  No evidence of pulmonary sarcoidosis

## 2013-08-22 NOTE — Assessment & Plan Note (Signed)
-   PFT's 08/22/2013  FEV1  1.96 (103%0 ratio 83 and no change p B2 and nl fef25/75 p am symbicort, dLco 53 corrects to 72%   Not entirely clear she has much asthma but whatever component she has may not explain her persistent dep on saba's - try off symbicort to see to what extent any of her symptoms flare and if so resume symbiocort perhaps at 80 2 bid to reduce tendency for ICS to cause cough  The proper method of use, as well as anticipated side effects, of a metered-dose inhaler are discussed and demonstrated to the patient. Improved effectiveness after extensive coaching during this visit to a level of approximately  75%

## 2013-08-22 NOTE — Assessment & Plan Note (Signed)
?   Acid (or non-acid) GERD > always difficult to exclude as up to 75% of pts in some series report no assoc GI/ Heartburn symptoms> rec max (24h)  acid suppression and diet restrictions/ reviewed and instructions given in writing.   Referred back to GI > Dr Juanda Chance

## 2013-08-22 NOTE — Patient Instructions (Addendum)
Try off symbicort to see what difference if any it makes with your breathing or cough or need for rescue  and if worse ok to take it 2 puffs every 12 hours  Please see patient coordinator before you leave today  to schedule GI appt with Dr Lina Sar  Pantoprazole (protonix) 40 mg   Take 30-60 min before first meal of the day and Pepcid 20 mg one bedtime until see Dr Juanda Chance  GERD (REFLUX)  is an extremely common cause of respiratory symptoms, many times with no significant heartburn at all.    It can be treated with medication, but also with lifestyle changes including avoidance of late meals, excessive alcohol, smoking cessation, and avoid fatty foods, chocolate, peppermint, colas, red wine, and acidic juices such as orange juice.  NO MINT OR MENTHOL PRODUCTS SO NO COUGH DROPS  USE SUGARLESS CANDY INSTEAD (jolley ranchers or Stover's)  NO OIL BASED VITAMINS - use powdered substitutes.  Please schedule a follow up visit in 3 months but call sooner if needed

## 2013-08-22 NOTE — Progress Notes (Signed)
Subjective:    Patient ID: [name], female    DOB: 1951/08/07 MRN: [id number]  HPI  89 yobf quit smoking in 1980 and dx with sarcoidosis in 1996 but free of evidence of active sarcoid off steroids when last seen in pulmonary clinic in 04/2010 with predominantly upper airway cough  07/11/2013 ov/ Wert   Chief Complaint  Patient presents with  . Pulmonary Consult    Pt last seen here in 2009- referred back per Dr. Frederik Pear request. Pt c/o cough for 2 yrs and SOB on and off for the past 6 months.  Cough is occ prod with very minimal yellow sputum.  She states that she gets out of breath walking minimal distances such as from room to room at home and from parking lot to building today.   always better p prednisone but 2 weeks later  symptoms resume, last time was Feb 2014 and gradual onset progressive symptoms since then of  Cough = sob, better p saba,  Cough seems worse p supper, does not typically wake her up.  Only using rescue once or twice weekly and says symbicort works when she takes it.  Has h/o GERD / Barrett's denies being told to stay on ppi rec Prednisone 10 mg take  4 each am x 2 days,   2 each am x 2 days,  1 each am x 2 days and stop  Symbicort 160 Take 2 puffs first thing in am and then another 2 puffs about 12 hours later.  Work on inhaler technique:   Only use your albuterol (ventolin) as rescue GERD   Please remember to go to the  x-ray department downstairs for your tests - we will call you with the results when they are available. Please schedule a follow up office visit in 6 weeks, call sooner if needed with pfts  add :  Need to sort out ? Of Barrett's and whether she needs lifetime ppi/ gi   08/22/2013 f/u ov/Wert re: ? Asthma/ ? UACS Chief Complaint  Patient presents with  . Follow-up    Cough is slightly better. She is starting to produce more sputum-yellow in color. She is using rescue inahaler once per day on average.   overall a little less doe on  symbicort vs baseline but still using saba which helps with cough esp in pm's   No obvious day to day or daytime variabilty or assoc   cp or chest tightness, subjective wheeze overt sinus or hb symptoms. No unusual exp hx or h/o childhood pna/ asthma or knowledge of premature birth.  Sleeping ok without nocturnal  or early am exacerbation  of respiratory  c/o's or need for noct saba. Also denies any obvious fluctuation of symptoms with weather or environmental changes or other aggravating or alleviating factors except as outlined above   Current Medications, Allergies, Complete Past Medical History, Past Surgical History, Family History, and Social History were reviewed in [employer] record.  ROS  The following are not active complaints unless bolded sore throat, dysphagia, dental problems, itching, sneezing,  nasal congestion or excess/ purulent secretions, ear ache,   fever, chills, sweats, unintended wt loss, pleuritic or exertional cp, hemoptysis,  orthopnea pnd or leg swelling, presyncope, palpitations, heartburn, abdominal pain, anorexia, nausea, vomiting, diarrhea  or change in bowel or urinary habits, change in stools or urine, dysuria,hematuria,  rash, arthralgias, visual complaints, headache, numbness weakness or ataxia or problems with walking or coordination,  change in mood/affect or  memory.       .  .            Objective:   Physical Exam  amb bf nad  08/22/2013     204  Wt Readings from Last 3 Encounters:  07/11/13 204 lb (92.534 kg)  10/15/12 193 lb (87.544 kg)  09/23/12 191 lb (86.637 kg)     HEENT: nl dentition, turbinates, and orophanx. Nl external ear canals without cough reflex   NECK :  without JVD/Nodes/TM/ nl carotid upstrokes bilaterally   LUNGS: no acc muscle use, clear bilaterally    CV:  RRR  no s3 or murmur or increase in P2, no edema   ABD:  soft and nontender with nl excursion in the supine position. No bruits or  organomegaly, bowel sounds nl  MS:  warm without deformities, calf tenderness, cyanosis or clubbing  SKIN: warm and dry without lesions    NEURO:  alert, approp, no deficits    CXR  07/11/2013 :  There is chronic reticulonodular interstitial prominence bilaterally probable due to sarcoid and fibrotic changes. There is slight worsening in aeration in right infrahilar region with streaky Opacity.     Assessment & Plan:

## 2013-08-22 NOTE — Progress Notes (Signed)
PFT done today. 

## 2013-10-07 ENCOUNTER — Ambulatory Visit: Payer: BC Managed Care – PPO | Admitting: Internal Medicine

## 2013-10-07 ENCOUNTER — Encounter: Payer: Self-pay | Admitting: Internal Medicine

## 2013-10-07 ENCOUNTER — Ambulatory Visit (INDEPENDENT_AMBULATORY_CARE_PROVIDER_SITE_OTHER): Payer: BC Managed Care – PPO | Admitting: Internal Medicine

## 2013-10-07 VITALS — BP 98/70 | HR 68 | Ht 64.0 in | Wt 205.2 lb

## 2013-10-07 DIAGNOSIS — K219 Gastro-esophageal reflux disease without esophagitis: Secondary | ICD-10-CM

## 2013-10-07 DIAGNOSIS — R059 Cough, unspecified: Secondary | ICD-10-CM

## 2013-10-07 DIAGNOSIS — R05 Cough: Secondary | ICD-10-CM

## 2013-10-07 NOTE — Progress Notes (Signed)
[name] 07/01/1951 [id number]  Note: This dictation was prepared with Dragon digital system. Any transcriptional errors that result from this procedure are unintentional.   History of Present Illness:  This is a 63-year-old African American female who has had chronic gastroesophageal reflux for many years and finally underwent a Nissen fundoplication in 2006 by Dr. Martin who also reduced her large hiatal hernia. An upper endoscopy in 2006 showed a functioning Nissen fundoplication and no evidence of Barrett's esophagus. She had a prior laparoscopic cholecystectomy. She also has lactose intolerance. Her last colonoscopy was in March 2006 and she should be due for a repeat colonoscopy in March 2016. She is complaining of a continuous cough during the day as well as at night. She expectorates greenish yellow sputum. She has been followed by Dr. Hopper as well as by Dr.Wert and has been intermittently on steroids and Symbicort.. She has sarcoidosis and asthmatic bronchitis which are not at this point symptomatic, according to Dr Wert. A barium esophagram in August 2006 after the Nissen fundoplication showed disrupted peristalsis. She admits to having dysphagia to pills and rice as well as some other solid foods.     Past Medical History  Diagnosis Date  . GERD (gastroesophageal reflux disease)   . Barrett esophagus   . Pulmonary sarcoidosis   . Hiatal hernia   . Asthma   . Migraines   . IBS (irritable bowel syndrome)   . Anxiety     Past Surgical History  Procedure Laterality Date  . Cholecystectomy    . Rotator cuff repair      x3(2 on L, 1 on R)  . Cataract extraction, bilateral    . Bronchial biopsy  1996  . Nissen fundoplication      Allergies  Allergen Reactions  . Penicillins     ???  . Aspirin     gastritis    Family history and social history have been reviewed.  Review of Systems:   The remainder of the 10 point ROS is negative except as outlined in the  H&P  Physical Exam: General Appearance Well developed, in no distress. She calls during the interview and expectorates greenish sputum  Eyes  Non icteric  HEENT  Non traumatic, normocephalicvoice is slightly raspy  Mouth No lesion, tongue papillated, no cheilosis Neck Supple without adenopathy, thyroid not enlarged, no carotid bruits, no JVD Lungs Clear to auscultation bilaterally She has expiratory wheezes  COR Normal S1, normal S2,  rapid regular rhythm, no murmur, quiet precordium Abdomen Soft minimally tender in epigastrium. No distended. Normal active bowel sounds. Right upper quadrant normal  Rectal Noted  Extremities  No pedal edema Skin No lesions Neurological Alert and oriented x 3 Psychological Normal mood and affect  Assessment and Plan:   Problem #1 Persistent cough and question as to whether the etiology is secondary to asthmatic bronchitis or chronic gastroesophageal reflux with LPR. She has a history of severe reflux which was corrected surgically with a Nissen fundoplication almost 10 years ago.It is unlikely to have an intractable reflux at this point unless the fundoplication gets undone. We will proceed with a Bravo pH probe to be placed endoscopically which will monitor  acid reflux while she is off  Protonix and Pepcid.x 3 days This, hopefully, will document the presence or absence of significant reflux. We will also obtain a barium esophagram because of possibility of esophageal dysmotility. Dismotility may contribute to pooling of the secretions and subsequent expectoration. She will hold off Protonix   and Pepcid for 3 days prior to the pH testing.  Problem #2 Colorectal screening. She will be due for a recall colonoscopy in March 2016.    [name] 10/07/2013      

## 2013-10-07 NOTE — Patient Instructions (Addendum)
You have been scheduled for an endoscopy bravo probe. Please follow written instructions given to you at your visit today. If you use inhalers (even only as needed), please bring them with you on the day of your procedure.  You have been [facility]1st floor of the hospital) on Friday 10/14/13 at 9:30 am. Please arrive 15 minutes prior to your appointment for registration. Make certain not to have anything to eat or drink 6 hours prior to your test. If you need to reschedule for any reason, please contact radiology at [phone] to do so. __________________________________________________________________ A barium swallow is an examination that concentrates on views of the esophagus. This tends to be a double contrast exam (barium and two liquids which, when combined, create a gas to distend the wall of the oesophagus) or single contrast (non-ionic iodine based). The study is usually tailored to your symptoms so a good history is essential. Attention is paid during the study to the form, structure and configuration of the esophagus, looking for functional disorders (such as aspiration, dysphagia, achalasia, motility and reflux) EXAMINATION You may be asked to change into a gown, depending on the type of swallow being performed. A radiologist and radiographer will perform the procedure. The radiologist will advise you of the type of contrast selected for your procedure and direct you during the exam. You will be asked to stand, sit or lie in several different positions and to hold a small amount of fluid in your mouth before being asked to swallow while the imaging is performed .In some instances you may be asked to swallow barium coated marshmallows to assess the motility of a solid food bolus. The exam can be recorded as a digital or video fluoroscopy procedure. POST PROCEDURE It will take 1-2 days for the barium to pass through your system. To facilitate  this, it is important, unless otherwise directed, to increase your fluids for the next 24-48hrs and to resume your normal diet.  This test typically takes about 30 minutes to perform. __________________________________________________________________________________   CC: Dr Sherene SiresWert, Dr Alwyn RenHopper

## 2013-10-08 NOTE — Interval H&P Note (Signed)
History and Physical Interval Note:  10/08/2013 10:32 PM  [name]  has presented today for surgery, with the diagnosis of cough, gerd  The various methods of treatment have been discussed with the patient and family. After consideration of risks, benefits and other options for treatment, the patient has consented to  Procedure(s): BRAVO PH STUDY (N/A) ESOPHAGOGASTRODUODENOSCOPY (EGD) (N/A) as a surgical intervention .  The patient's history has been reviewed, patient examined, no change in status, stable for surgery.  I have reviewed the patient's chart and labs.  Questions were answered to the patient's satisfaction.     Lina Sarora Truc Winfree

## 2013-10-08 NOTE — H&P (View-Only) (Signed)
Margaretha SeedsBarbara V Swift 14-Oct-1950 [id number]  Note: This dictation was prepared with Dragon digital system. Any transcriptional errors that result from this procedure are unintentional.   History of Present Illness:  This is a 63 year old African American female who has had chronic gastroesophageal reflux for many years and finally underwent a Nissen fundoplication in 2006 by Dr. Daphine DeutscherMartin who also reduced her large hiatal hernia. An upper endoscopy in 2006 showed a functioning Nissen fundoplication and no evidence of Barrett's esophagus. She had a prior laparoscopic cholecystectomy. She also has lactose intolerance. Her last colonoscopy was in March 2006 and she should be due for a repeat colonoscopy in March 2016. She is complaining of a continuous cough during the day as well as at night. She expectorates greenish yellow sputum. She has been followed by Dr. Alwyn RenHopper as well as by Dr.Wert and has been intermittently on steroids and Symbicort.. She has sarcoidosis and asthmatic bronchitis which are not at this point symptomatic, according to Dr Sherene SiresWert. A barium esophagram in August 2006 after the Nissen fundoplication showed disrupted peristalsis. She admits to having dysphagia to pills and rice as well as some other solid foods.     Past Medical History  Diagnosis Date  . GERD (gastroesophageal reflux disease)   . Barrett esophagus   . Pulmonary sarcoidosis   . Hiatal hernia   . Asthma   . Migraines   . IBS (irritable bowel syndrome)   . Anxiety     Past Surgical History  Procedure Laterality Date  . Cholecystectomy    . Rotator cuff repair      x3(2 on L, 1 on R)  . Cataract extraction, bilateral    . Bronchial biopsy  1996  . Nissen fundoplication      Allergies  Allergen Reactions  . Penicillins     ???  . Aspirin     gastritis    Family history and social history have been reviewed.  Review of Systems:   The remainder of the 10 point ROS is negative except as outlined in the  H&P  Physical Exam: General Appearance Well developed, in no distress. She calls during the interview and expectorates greenish sputum  Eyes  Non icteric  HEENT  Non traumatic, normocephalicvoice is slightly raspy  Mouth No lesion, tongue papillated, no cheilosis Neck Supple without adenopathy, thyroid not enlarged, no carotid bruits, no JVD Lungs Clear to auscultation bilaterally She has expiratory wheezes  COR Normal S1, normal S2,  rapid regular rhythm, no murmur, quiet precordium Abdomen Soft minimally tender in epigastrium. No distended. Normal active bowel sounds. Right upper quadrant normal  Rectal Noted  Extremities  No pedal edema Skin No lesions Neurological Alert and oriented x 3 Psychological Normal mood and affect  Assessment and Plan:   Problem #1 Persistent cough and question as to whether the etiology is secondary to asthmatic bronchitis or chronic gastroesophageal reflux with LPR. She has a history of severe reflux which was corrected surgically with a Nissen fundoplication almost 10 years ago.It is unlikely to have an intractable reflux at this point unless the fundoplication gets undone. We will proceed with a Bravo pH probe to be placed endoscopically which will monitor  acid reflux while she is off  Protonix and Pepcid.x 3 days This, hopefully, will document the presence or absence of significant reflux. We will also obtain a barium esophagram because of possibility of esophageal dysmotility. Dismotility may contribute to pooling of the secretions and subsequent expectoration. She will hold off Protonix  and Pepcid for 3 days prior to the pH testing.  Problem #2 Colorectal screening. She will be due for a recall colonoscopy in March 2016.    Lina Sar 10/07/2013

## 2013-10-09 ENCOUNTER — Encounter: Payer: Self-pay | Admitting: Internal Medicine

## 2013-10-10 ENCOUNTER — Encounter: Payer: Self-pay | Admitting: Internal Medicine

## 2013-10-10 ENCOUNTER — Ambulatory Visit (HOSPITAL_COMMUNITY)
Admission: RE | Admit: 2013-10-10 | Discharge: 2013-10-10 | Disposition: A | Discharge status: Home / Self Care | Payer: BC Managed Care – PPO | Source: Ambulatory Visit | Attending: Internal Medicine | Admitting: Internal Medicine

## 2013-10-10 ENCOUNTER — Encounter (HOSPITAL_COMMUNITY)
Admission: RE | Disposition: A | Discharge status: Home / Self Care | Payer: Self-pay | Source: Ambulatory Visit | Attending: Internal Medicine

## 2013-10-10 DIAGNOSIS — K589 Irritable bowel syndrome without diarrhea: Secondary | ICD-10-CM | POA: Insufficient documentation

## 2013-10-10 DIAGNOSIS — D869 Sarcoidosis, unspecified: Secondary | ICD-10-CM | POA: Insufficient documentation

## 2013-10-10 DIAGNOSIS — R1310 Dysphagia, unspecified: Secondary | ICD-10-CM | POA: Insufficient documentation

## 2013-10-10 DIAGNOSIS — K219 Gastro-esophageal reflux disease without esophagitis: Secondary | ICD-10-CM | POA: Insufficient documentation

## 2013-10-10 DIAGNOSIS — J45909 Unspecified asthma, uncomplicated: Secondary | ICD-10-CM | POA: Insufficient documentation

## 2013-10-10 DIAGNOSIS — Z9089 Acquired absence of other organs: Secondary | ICD-10-CM | POA: Insufficient documentation

## 2013-10-10 DIAGNOSIS — K2270 Barrett's esophagus without dysplasia: Secondary | ICD-10-CM

## 2013-10-10 DIAGNOSIS — R05 Cough: Secondary | ICD-10-CM

## 2013-10-10 DIAGNOSIS — E739 Lactose intolerance, unspecified: Secondary | ICD-10-CM | POA: Insufficient documentation

## 2013-10-10 DIAGNOSIS — R059 Cough, unspecified: Secondary | ICD-10-CM | POA: Insufficient documentation

## 2013-10-10 DIAGNOSIS — K208 Other esophagitis without bleeding: Secondary | ICD-10-CM | POA: Insufficient documentation

## 2013-10-10 DIAGNOSIS — K2960 Other gastritis without bleeding: Secondary | ICD-10-CM | POA: Insufficient documentation

## 2013-10-10 HISTORY — PX: BRAVO PH STUDY: SHX5421

## 2013-10-10 HISTORY — PX: ESOPHAGOGASTRODUODENOSCOPY: SHX5428

## 2013-10-10 SURGERY — PH MONITORING, ESOPHAGUS, WIRELESS
Anesthesia: Moderate Sedation

## 2013-10-10 MED ORDER — MIDAZOLAM HCL 10 MG/2ML IJ SOLN
INTRAMUSCULAR | Status: AC
  Filled 2013-10-10: qty 2

## 2013-10-10 MED ORDER — FENTANYL CITRATE 0.05 MG/ML IJ SOLN
INTRAMUSCULAR | Status: DC | PRN
  Administered 2013-10-10: 25 ug via INTRAVENOUS
  Administered 2013-10-10: 12.5 ug via INTRAVENOUS

## 2013-10-10 MED ORDER — MIDAZOLAM HCL 10 MG/2ML IJ SOLN
INTRAMUSCULAR | Status: DC | PRN
  Administered 2013-10-10 (×2): 1 mg via INTRAVENOUS
  Administered 2013-10-10: 2 mg via INTRAVENOUS
  Administered 2013-10-10 (×2): 1 mg via INTRAVENOUS

## 2013-10-10 MED ORDER — SODIUM CHLORIDE 0.9 % IV SOLN: INTRAVENOUS | Status: DC

## 2013-10-10 MED ORDER — FENTANYL CITRATE 0.05 MG/ML IJ SOLN
INTRAMUSCULAR | Status: AC
  Filled 2013-10-10: qty 2

## 2013-10-10 MED ORDER — BUTAMBEN-TETRACAINE-BENZOCAINE 2-2-14 % EX AERO
INHALATION_SPRAY | CUTANEOUS | Status: DC | PRN
  Administered 2013-10-10: 2 via TOPICAL

## 2013-10-10 NOTE — Discharge Instructions (Signed)
Gastrointestinal Endoscopy °Care After °Refer to this sheet in the next few weeks. These instructions provide you with information on caring for yourself after your procedure. Your caregiver may also give you more specific instructions. Your treatment has been planned according to current medical practices, but problems sometimes occur. Call your caregiver if you have any problems or questions after your procedure. °HOME CARE INSTRUCTIONS °· If you were given medicine to help you relax (sedative), do not drive, operate machinery, or sign important documents for 24 hours. °· Avoid alcohol and hot or warm beverages for the first 24 hours after the procedure. °· Only take over-the-counter or prescription medicines for pain, discomfort, or fever as directed by your caregiver. You may resume taking your normal medicines unless your caregiver tells you otherwise. Ask your caregiver when you may resume taking medicines that may cause bleeding, such as aspirin, clopidogrel, or warfarin. °· You may return to your normal diet and activities on the day after your procedure, or as directed by your caregiver. Walking may help to reduce any bloated feeling in your abdomen. °· Drink enough fluids to keep your urine clear or pale yellow. °· You may gargle with salt water if you have a sore throat. °SEEK IMMEDIATE MEDICAL CARE IF: °· You have severe nausea or vomiting. °· You have severe abdominal pain, abdominal cramps that last longer than 6 hours, or abdominal swelling (distention). °· You have severe shoulder or back pain. °· You have trouble swallowing. °· You have shortness of breath, your breathing is shallow, or you are breathing faster than normal. °· You have a fever or a rapid heartbeat. °· You vomit blood or material that looks like coffee grounds. °· You have bloody, black, or tarry stools. °MAKE SURE YOU: °· Understand these instructions. °· Will watch your condition. °· Will get help right away if you are not doing  well or get worse. °Document Released: 04/08/2004 Document Revised: 02/24/2012 Document Reviewed: 11/25/2011 °ExitCare® Patient Information ©2014 [employer]. ° °

## 2013-10-10 NOTE — Op Note (Signed)
[facility] [address] [place], [id number]   ENDOSCOPY PROCEDURE REPORT  PATIENT: Kiara SeedsHylton, Kerston V.  MR#: [id number] BIRTHDATE: 1951-07-21 , 63  yrs. old GENDER: Female ENDOSCOPIST: Hart Carwinora M Hilja Kintzel, MD REFERRED BY:  Nyoka CowdenMichael B Wert, M.D. Dr Lacretia NicksW.Hopper PROCEDURE DATE:  10/10/2013 PROCEDURE:  EGD w/ biopsy , EGD w/ Bravo capsule placement, and Maloney dilation of esophagus ASA CLASS:     Class II INDICATIONS:  chronic cough, asthmatic bronchitis, hx sarcoidosis, s/p Nissen fundoplication 2006, post plication Barium esophagram showed dismotility,. MEDICATIONS: These medications were titrated to patient response per physician's verbal order, Fentanyl-Detailed 37.5 mg IV, and Versed 6 mg IV TOPICAL ANESTHETIC: Cetacaine Spray  DESCRIPTION OF PROCEDURE: After the risks benefits and alternatives of the procedure were thoroughly explained, informed consent was obtained.  The Pentax Gastroscope [id number] endoscope was introduced through the mouth and advanced to the second portion of the duodenum. Without limitations.  The instrument was slowly withdrawn as the mucosa was fully examined.      Esophagus: esophageal mucosa was normal in the proximal mid and distal esophagus. There was no stricture. Biopsies were taken from the Z line. Nissan fundoplication was functioning. There was a mild resistance when scope was passed through the LES[ stomach: Gastric mucosa appeared normal. There was no hiatal hernia. Gastric antrum and body were normal. Biopsies were taken for H. pylori. Gastric outlet was normal. Retroflexion of the endoscope confirmed the presence of Nissen fundoplication Duodenum: Duodenal bulb and descending duodenum was normal Maloney dilator 52 [place] passed through the esophagus with mild resistance Z line was located at the 35 cm from the incisors. PH bravo probe was placed orally to the level of 29 cm which is 6 cm proximal to the Z line and was applied  to the esophageal mucosa. Re endoscopy confirmed position of the PH probe at 29 cm  .there was moderate amount of the pooled secretions in the esophagus. Patient continued to cough during the procedure       The scope was then withdrawn from the patient and the procedure completed.  COMPLICATIONS: There were no complications. ENDOSCOPIC IMPRESSION: functioning Nissen fundoplication,no stricture Status post passage of 4552 French Maloney dilator placement of bravo probe at 29 cm from the incisors which is 6 cm proximal to the Z line Status post biopsies from the gastric antrum to rule out H. pylori moderate amount of secretions pooled in the esophagus  RECOMMENDATIONS: we will await results of the biopsies Await results of Bravo probe in 48 hours continue antireflux measures Continue PPI REPEAT EXAM: for EGD pending biopsy results.  eSigned:  Hart Carwinora M Ayshia Gramlich, MD 10/10/2013 11:58 AM   CC:  PATIENT NAME:  Kiara SeedsHylton, Malaak V. MR#: [id number]

## 2013-10-10 NOTE — Interval H&P Note (Signed)
History and Physical Interval Note:  10/10/2013 10:43 AM  [name]  has presented today for surgery, with the diagnosis of cough, gerd  The various methods of treatment have been discussed with the patient and family. After consideration of risks, benefits and other options for treatment, the patient has consented to  Procedure(s): BRAVO PH STUDY (N/A) ESOPHAGOGASTRODUODENOSCOPY (EGD) (N/A) as a surgical intervention .  The patient's history has been reviewed, patient examined, no change in status, stable for surgery.  I have reviewed the patient's chart and labs.  Questions were answered to the patient's satisfaction.     Lina Sarora Brodie

## 2013-10-11 ENCOUNTER — Encounter (HOSPITAL_COMMUNITY): Payer: Self-pay | Admitting: Internal Medicine

## 2013-10-11 ENCOUNTER — Telehealth: Payer: Self-pay | Admitting: Internal Medicine

## 2013-10-11 ENCOUNTER — Encounter: Payer: Self-pay | Admitting: Internal Medicine

## 2013-10-11 NOTE — Telephone Encounter (Signed)
Spoke with [facility] and let them know about patient's red eyes. They will be calling patient also.

## 2013-10-11 NOTE — OR Nursing (Signed)
Spoke with patient regarding red eyes after her procedure on 10/10/13.  She states that her eyes are better today but the left one is still a little red.  We discussed her coughing during and after the procedure and her statement that she has chronic dry eyes that she has two different types of drops for.  I advised her to let us know if her problem did not resolve.  She states that she will contact her eye doctor if the problem persist.  I also requested that she let us know how she is doing in a couple of days.  She states that we were very hospitable and professional and she appreciated our care.

## 2013-10-11 NOTE — Telephone Encounter (Signed)
Spoke with patient and she states her eyes are red. Her left eye is especially red. She reports she has chronic dry eyes. She did have a bravo ph study yesterday. She states her husband told her that her eyes were red yesterday after the test. She reports they a more red this AM without drainage. Told patient unlikely the study causes eyes to be red.

## 2013-10-12 ENCOUNTER — Telehealth: Payer: Self-pay | Admitting: Internal Medicine

## 2013-10-12 NOTE — Telephone Encounter (Signed)
Patient given [facility] number to call about returning her device.

## 2013-10-14 ENCOUNTER — Ambulatory Visit (HOSPITAL_COMMUNITY)
Admission: RE | Admit: 2013-10-14 | Discharge: 2013-10-14 | Disposition: A | Discharge status: Home / Self Care | Payer: BC Managed Care – PPO | Source: Ambulatory Visit | Attending: Internal Medicine | Admitting: Internal Medicine

## 2013-10-14 DIAGNOSIS — K219 Gastro-esophageal reflux disease without esophagitis: Secondary | ICD-10-CM | POA: Insufficient documentation

## 2013-10-14 DIAGNOSIS — R059 Cough, unspecified: Secondary | ICD-10-CM

## 2013-10-14 DIAGNOSIS — R1310 Dysphagia, unspecified: Secondary | ICD-10-CM | POA: Insufficient documentation

## 2013-10-14 DIAGNOSIS — R05 Cough: Secondary | ICD-10-CM

## 2013-10-27 ENCOUNTER — Telehealth: Payer: Self-pay | Admitting: Internal Medicine

## 2013-10-27 NOTE — Telephone Encounter (Signed)
I have reviewed the pH probe and  Spoke with the pt and sent letter to Dr Sherene SiresWert.

## 2013-10-27 NOTE — Telephone Encounter (Signed)
Patient is calling requesting results of Bravo Ph study. Please, advise.

## 2013-10-27 NOTE — Telephone Encounter (Signed)
Regina, I have not seen it , it should have been available. Would you know where it should be filed? Thanx. DB

## 2013-10-27 NOTE — Telephone Encounter (Signed)
It is scanned under procedures.

## 2013-10-28 NOTE — Telephone Encounter (Signed)
Message copied by Richardson Chiquito on Fri Oct 28, 2013  3:02 PM ------      Message from: Hart Carwin      Created: Fri Oct 28, 2013 12:35 PM      Regarding: RE: cough evaluation       OK  db      ----- Message -----         From: Nyoka Cowden, MD         Sent: 10/27/2013   3:30 PM           To: Hart Carwin, MD, Christen Butter, CMA      Subject: RE: cough evaluation                                     Yes go ahead, I'll cancel her f/u here      ----- Message -----         From: Hart Carwin, MD         Sent: 10/27/2013   3:16 PM           To: Richardson Chiquito, CMA, Nyoka Cowden, MD      Subject: RE: cough evaluation                                     Thanx for response. If you don't need to see her next month, ask your nurse to cancel the appointment and I will call her about the Voice center. I already mentioned it to her and she will be fine with it.. Shall I move on with the referral? DB      ----- Message -----         From: Nyoka Cowden, MD         Sent: 10/27/2013   3:05 PM           To: Hart Carwin, MD      Subject: RE: cough evaluation                                     Excellent work up, that answers the question and it's fine with me to send to ent especially since she's convinced this is a sinus issue as I have not been convinced lately it's a lung problem.            I did not know the status of the voice center since their main two powerhouses ([place] [facility] [place])  departed and appreciate the info!            Kathlene November             ----- Message -----         From: Hart Carwin, MD         Sent: 10/27/2013  10:50 AM           To: Richardson Chiquito, CMA, Nyoka Cowden, MD      Subject: cough evaluation                                         Kathlene November, we have completed  Evaluation on [name] with regards to her cough being caused by acid reflux. The work up is essentially negative based on her Barium esophagram ( minimal reflux) and 48 hour pH monitoring Bravo  probe, which showed total De Meester's score of 10.7 ( normal < 14.0), the few reflux episodes occurred at night. I spoke with her this am, she is still coughing. She has an appointment with You in 3 weeks. She wonders if she could have  sinus x-rays before she sees you, has been having some Sx's.I have had a good luck with sending patients with  " non-pulmonary" cough [facility], Dr Madilyn Hookees and Dr Delford FieldWright, they are ENT's specializing in cough/hoarsness etc.  Tel [phone]. They send a nice note back,very helpful, and it works. Let me know if you want me to arrange a referral. Thanx DB                         ------

## 2013-10-28 NOTE — Telephone Encounter (Signed)
Thank you very much DB

## 2013-10-28 NOTE — Telephone Encounter (Signed)
Dr Juanda ChanceBrodie and Dr Sherene SiresWert- Lorain ChildesFYI, patient has been referred to [facility]. She will see Dr Delford FieldWright on 01/03/14 @ 3:40 pm. All pertinent GI and pulmonary notes/procedures have been faxed to Dr Lynelle DoctorWright's office. Patient has been advised of this appointment and verbalizes understanding.

## 2013-11-21 ENCOUNTER — Ambulatory Visit: Payer: BC Managed Care – PPO | Admitting: Internal Medicine

## 2013-11-29 NOTE — Consult Note (Signed)
  Result of a 48 hour Bravo pH study performed on of 10/09/2013 Indication: intractable gastroesophageal reflux refractory to medications Results: The average De Meester score for 48 hours was 10.7., which is normal.  Patient had  11 episodes of chest  pain and  6 episodes of dysphagia, . The longest reflux episode also 5 minutes Total score for first day was 4.4  Total score for the second day was 18.5  Conclusion this is a normal 48 hour intraesophageal pH probe indicating no significant gastroesophageal reflux during the day or nightwith the minimal symptomatology. The study was done off proton pump inhibitors

## 2014-01-04 ENCOUNTER — Other Ambulatory Visit: Payer: Self-pay | Admitting: Internal Medicine

## 2014-01-17 ENCOUNTER — Other Ambulatory Visit: Payer: Self-pay

## 2014-01-17 DIAGNOSIS — F411 Generalized anxiety disorder: Secondary | ICD-10-CM

## 2014-01-17 MED ORDER — LORAZEPAM 0.5 MG PO TABS: ORAL_TABLET | ORAL | Status: DC

## 2014-01-17 NOTE — Telephone Encounter (Signed)
#  15 only Last seen 2/14 ; OV required for refills

## 2014-01-17 NOTE — Telephone Encounter (Signed)
Last ov with you on 10/15/2012

## 2014-02-06 ENCOUNTER — Telehealth: Payer: Self-pay | Admitting: Internal Medicine

## 2014-02-06 NOTE — Telephone Encounter (Signed)
Pt wase evaluated at [facility] by Dr London Pepper- Dx- neurogenic cough. She was put on Neurontin 100mg  tid. Is she still on it? It is not due to GERD( See Bravo probe was negative)., so Dr Delford Field is the one to contact.

## 2014-02-06 NOTE — Telephone Encounter (Signed)
Spoke with patient and she states she saw Dr. Delford Field at [facility] and she will see him again on 02/27/14. She reports she went to her granddaughter's graduation and now has has coughing and fever at night. She is going to see Dr. Alwyn Ren this week. Reports that it hurts at the top of her stomach where her hiatal hernia is located. She is asking if this "could be infected and causing my cough and fever." suggested patient also let Dr. Delford Field at [facility] know about her symptoms.

## 2014-02-07 NOTE — Telephone Encounter (Signed)
Spoke with patient and gave her Dr. Brodie's recommendation.  

## 2014-02-08 ENCOUNTER — Ambulatory Visit (INDEPENDENT_AMBULATORY_CARE_PROVIDER_SITE_OTHER): Payer: BC Managed Care – PPO | Admitting: Internal Medicine

## 2014-02-08 ENCOUNTER — Other Ambulatory Visit (INDEPENDENT_AMBULATORY_CARE_PROVIDER_SITE_OTHER): Payer: Medicare Other

## 2014-02-08 ENCOUNTER — Encounter: Payer: Self-pay | Admitting: Internal Medicine

## 2014-02-08 ENCOUNTER — Ambulatory Visit (INDEPENDENT_AMBULATORY_CARE_PROVIDER_SITE_OTHER)
Admission: RE | Admit: 2014-02-08 | Discharge: 2014-02-08 | Disposition: A | Discharge status: Home / Self Care | Payer: BC Managed Care – PPO | Source: Ambulatory Visit | Attending: Internal Medicine | Admitting: Internal Medicine

## 2014-02-08 VITALS — BP 150/100 | HR 120 | Temp 98.6°F | Ht 64.0 in | Wt 204.6 lb

## 2014-02-08 DIAGNOSIS — M79609 Pain in unspecified limb: Secondary | ICD-10-CM

## 2014-02-08 DIAGNOSIS — M79661 Pain in right lower leg: Secondary | ICD-10-CM

## 2014-02-08 DIAGNOSIS — J45909 Unspecified asthma, uncomplicated: Secondary | ICD-10-CM

## 2014-02-08 DIAGNOSIS — J209 Acute bronchitis, unspecified: Secondary | ICD-10-CM

## 2014-02-08 DIAGNOSIS — E669 Obesity, unspecified: Secondary | ICD-10-CM | POA: Insufficient documentation

## 2014-02-08 DIAGNOSIS — M79662 Pain in left lower leg: Secondary | ICD-10-CM

## 2014-02-08 LAB — CBC WITH DIFFERENTIAL/PLATELET
BASOS ABS: 0 10*3/uL (ref 0.0–0.1)
Basophils Relative: 0.5 % (ref 0.0–3.0)
EOS PCT: 2.6 % (ref 0.0–5.0)
Eosinophils Absolute: 0.2 10*3/uL (ref 0.0–0.7)
HEMATOCRIT: 40 % (ref 36.0–46.0)
HEMOGLOBIN: 13.4 g/dL (ref 12.0–15.0)
Lymphocytes Relative: 19.4 % (ref 12.0–46.0)
Lymphs Abs: 1.5 10*3/uL (ref 0.7–4.0)
MCHC: 33.6 g/dL (ref 30.0–36.0)
MCV: 89.9 fl (ref 78.0–100.0)
MONOS PCT: 13.5 % — AB (ref 3.0–12.0)
Monocytes Absolute: 1.1 10*3/uL — ABNORMAL HIGH (ref 0.1–1.0)
NEUTROS ABS: 5 10*3/uL (ref 1.4–7.7)
Neutrophils Relative %: 64 % (ref 43.0–77.0)
Platelets: 301 10*3/uL (ref 150.0–400.0)
RBC: 4.44 Mil/uL (ref 3.87–5.11)
RDW: 13.7 % (ref 11.5–15.5)
WBC: 7.9 10*3/uL (ref 4.0–10.5)

## 2014-02-08 MED ORDER — ALBUTEROL SULFATE HFA 108 (90 BASE) MCG/ACT IN AERS
INHALATION_SPRAY | RESPIRATORY_TRACT | Status: DC
Start: 2014-02-08 — End: 2014-03-07

## 2014-02-08 MED ORDER — LEVOFLOXACIN 500 MG PO TABS: 500.0000 mg | ORAL_TABLET | Freq: Every day | ORAL | Status: DC

## 2014-02-08 MED ORDER — PREDNISONE 20 MG PO TABS: 20.0000 mg | ORAL_TABLET | Freq: Every day | ORAL | Status: DC
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
## 2014-02-08 NOTE — Patient Instructions (Signed)
Albuterol is a rescue inhaler which should be used as infrequently as possible; it should never be used more than 1-2 puffs every 4 hours. It may  be used 15-30 minutes before exercise if that is also  a trigger for your asthma.   If it is required more than 2-3 times per week; the asthma is not well controlled. Symbicort , Dulera , Advair or other maintenance agent should be considered to control smooth muscle spasm and airway inflammation  and to prevent adverse effects from excess albuterol use.Those adverse effects can include health or life threatening heart rhythm irregularities. Your next office appointment will be determined based upon review of your pending labs & x-rays. Those instructions will be transmitted to you through My Chart .

## 2014-02-08 NOTE — Progress Notes (Signed)
Pre visit review using our clinic review tool, if applicable. No additional management support is needed unless otherwise documented below in the visit note. 

## 2014-02-09 LAB — D-DIMER, QUANTITATIVE: D-Dimer, Quant: 1.09 ug/mL-FEU — ABNORMAL HIGH (ref 0.00–0.48)

## 2014-03-06 ENCOUNTER — Ambulatory Visit: Payer: Medicare Other | Admitting: Internal Medicine

## 2014-03-07 ENCOUNTER — Encounter (HOSPITAL_COMMUNITY): Payer: Self-pay | Admitting: Emergency Medicine

## 2014-03-07 ENCOUNTER — Encounter: Payer: Self-pay | Admitting: Internal Medicine

## 2014-03-07 ENCOUNTER — Emergency Department (HOSPITAL_COMMUNITY): Payer: BC Managed Care – PPO

## 2014-03-07 ENCOUNTER — Ambulatory Visit (INDEPENDENT_AMBULATORY_CARE_PROVIDER_SITE_OTHER): Payer: BC Managed Care – PPO | Admitting: Internal Medicine

## 2014-03-07 ENCOUNTER — Inpatient Hospital Stay (HOSPITAL_COMMUNITY)
Admission: EM | Admit: 2014-03-07 | Discharge: 2014-03-09 | DRG: 189 | Disposition: A | Discharge status: Home / Self Care | Payer: BC Managed Care – PPO | Attending: Internal Medicine | Admitting: Internal Medicine

## 2014-03-07 VITALS — BP 120/84 | HR 114 | Temp 99.3°F | Wt 209.6 lb

## 2014-03-07 DIAGNOSIS — R05 Cough: Secondary | ICD-10-CM | POA: Diagnosis present

## 2014-03-07 DIAGNOSIS — Z87898 Personal history of other specified conditions: Secondary | ICD-10-CM

## 2014-03-07 DIAGNOSIS — Z880 Allergy status to penicillin: Secondary | ICD-10-CM | POA: Diagnosis not present

## 2014-03-07 DIAGNOSIS — Z87891 Personal history of nicotine dependence: Secondary | ICD-10-CM | POA: Diagnosis not present

## 2014-03-07 DIAGNOSIS — Z86010 Personal history of colonic polyps: Secondary | ICD-10-CM

## 2014-03-07 DIAGNOSIS — R0902 Hypoxemia: Secondary | ICD-10-CM

## 2014-03-07 DIAGNOSIS — D869 Sarcoidosis, unspecified: Secondary | ICD-10-CM | POA: Diagnosis present

## 2014-03-07 DIAGNOSIS — R0682 Tachypnea, not elsewhere classified: Secondary | ICD-10-CM | POA: Diagnosis present

## 2014-03-07 DIAGNOSIS — Z9849 Cataract extraction status, unspecified eye: Secondary | ICD-10-CM

## 2014-03-07 DIAGNOSIS — R059 Cough, unspecified: Secondary | ICD-10-CM | POA: Diagnosis present

## 2014-03-07 DIAGNOSIS — E669 Obesity, unspecified: Secondary | ICD-10-CM

## 2014-03-07 DIAGNOSIS — K449 Diaphragmatic hernia without obstruction or gangrene: Secondary | ICD-10-CM

## 2014-03-07 DIAGNOSIS — J45909 Unspecified asthma, uncomplicated: Secondary | ICD-10-CM | POA: Diagnosis present

## 2014-03-07 DIAGNOSIS — J99 Respiratory disorders in diseases classified elsewhere: Secondary | ICD-10-CM | POA: Diagnosis present

## 2014-03-07 DIAGNOSIS — K589 Irritable bowel syndrome without diarrhea: Secondary | ICD-10-CM | POA: Diagnosis present

## 2014-03-07 DIAGNOSIS — Z886 Allergy status to analgesic agent status: Secondary | ICD-10-CM

## 2014-03-07 DIAGNOSIS — J9620 Acute and chronic respiratory failure, unspecified whether with hypoxia or hypercapnia: Principal | ICD-10-CM | POA: Diagnosis present

## 2014-03-07 DIAGNOSIS — J309 Allergic rhinitis, unspecified: Secondary | ICD-10-CM

## 2014-03-07 DIAGNOSIS — K2100 Gastro-esophageal reflux disease with esophagitis, without bleeding: Secondary | ICD-10-CM

## 2014-03-07 DIAGNOSIS — F411 Generalized anxiety disorder: Secondary | ICD-10-CM | POA: Diagnosis present

## 2014-03-07 DIAGNOSIS — K2270 Barrett's esophagus without dysplasia: Secondary | ICD-10-CM | POA: Diagnosis present

## 2014-03-07 DIAGNOSIS — J9601 Acute respiratory failure with hypoxia: Secondary | ICD-10-CM | POA: Diagnosis present

## 2014-03-07 DIAGNOSIS — K219 Gastro-esophageal reflux disease without esophagitis: Secondary | ICD-10-CM | POA: Diagnosis present

## 2014-03-07 DIAGNOSIS — J9621 Acute and chronic respiratory failure with hypoxia: Secondary | ICD-10-CM

## 2014-03-07 DIAGNOSIS — J9600 Acute respiratory failure, unspecified whether with hypoxia or hypercapnia: Secondary | ICD-10-CM

## 2014-03-07 LAB — CBC WITH DIFFERENTIAL/PLATELET
Basophils Absolute: 0.3 10*3/uL — ABNORMAL HIGH (ref 0.0–0.1)
Basophils Relative: 5 % — ABNORMAL HIGH (ref 0–1)
EOS ABS: 0.3 10*3/uL (ref 0.0–0.7)
Eosinophils Relative: 4 % (ref 0–5)
HEMATOCRIT: 39.7 % (ref 36.0–46.0)
Hemoglobin: 13 g/dL (ref 12.0–15.0)
Lymphocytes Relative: 37 % (ref 12–46)
Lymphs Abs: 2.4 10*3/uL (ref 0.7–4.0)
MCH: 29.7 pg (ref 26.0–34.0)
MCHC: 32.7 g/dL (ref 30.0–36.0)
MCV: 90.6 fL (ref 78.0–100.0)
MONO ABS: 0.8 10*3/uL (ref 0.1–1.0)
Monocytes Relative: 13 % — ABNORMAL HIGH (ref 3–12)
NEUTROS ABS: 2.6 10*3/uL (ref 1.7–7.7)
Neutrophils Relative %: 41 % — ABNORMAL LOW (ref 43–77)
Platelets: 252 10*3/uL (ref 150–400)
RBC: 4.38 MIL/uL (ref 3.87–5.11)
RDW: 13.7 % (ref 11.5–15.5)
WBC: 6.4 10*3/uL (ref 4.0–10.5)

## 2014-03-07 LAB — COMPREHENSIVE METABOLIC PANEL
ALT: 16 U/L (ref 0–35)
AST: 24 U/L (ref 0–37)
Albumin: 3.7 g/dL (ref 3.5–5.2)
Alkaline Phosphatase: 79 U/L (ref 39–117)
BUN: 6 mg/dL (ref 6–23)
CALCIUM: 9 mg/dL (ref 8.4–10.5)
CO2: 24 mEq/L (ref 19–32)
Chloride: 104 mEq/L (ref 96–112)
Creatinine, Ser: 1 mg/dL (ref 0.50–1.10)
GFR calc non Af Amer: 58 mL/min — ABNORMAL LOW (ref >90)
GFR, EST AFRICAN AMERICAN: 67 mL/min — AB (ref >90)
GLUCOSE: 91 mg/dL (ref 70–99)
Potassium: 3.6 mEq/L — ABNORMAL LOW (ref 3.7–5.3)
Sodium: 141 mEq/L (ref 137–147)
TOTAL PROTEIN: 8 g/dL (ref 6.0–8.3)
Total Bilirubin: 0.3 mg/dL (ref 0.3–1.2)

## 2014-03-07 LAB — I-STAT TROPONIN, ED: Troponin i, poc: 0.01 ng/mL (ref 0.00–0.08)

## 2014-03-07 LAB — I-STAT CG4 LACTIC ACID, ED: LACTIC ACID, VENOUS: 1.6 mmol/L (ref 0.5–2.2)

## 2014-03-07 LAB — PROTIME-INR
INR: 0.97 (ref 0.00–1.49)
Prothrombin Time: 12.9 seconds (ref 11.6–15.2)

## 2014-03-07 LAB — TROPONIN I: Troponin I: <0.3 ng/mL (ref <0.30)

## 2014-03-07 MED ORDER — DEXTROSE-NACL 5-0.45 % IV SOLN
INTRAVENOUS | Status: DC
  Administered 2014-03-07: 20:00:00 via INTRAVENOUS

## 2014-03-07 MED ORDER — SODIUM CHLORIDE 0.9 % IV SOLN: 250.0000 mL | INTRAVENOUS | Status: DC | PRN

## 2014-03-07 MED ORDER — SODIUM CHLORIDE 0.9 % IV BOLUS (SEPSIS)
1000.0000 mL | Freq: Once | INTRAVENOUS | Status: AC
  Administered 2014-03-07: 1000 mL via INTRAVENOUS

## 2014-03-07 MED ORDER — ALBUTEROL (5 MG/ML) CONTINUOUS INHALATION SOLN
15.0000 mg/h | INHALATION_SOLUTION | Freq: Once | RESPIRATORY_TRACT | Status: AC
  Administered 2014-03-07: 15 mg/h via RESPIRATORY_TRACT
  Filled 2014-03-07: qty 20

## 2014-03-07 MED ORDER — IPRATROPIUM BROMIDE 0.02 % IN SOLN
1.0000 mg | Freq: Once | RESPIRATORY_TRACT | Status: AC
  Administered 2014-03-07: 1 mg via RESPIRATORY_TRACT
  Filled 2014-03-07: qty 5

## 2014-03-07 MED ORDER — IPRATROPIUM-ALBUTEROL 0.5-2.5 (3) MG/3ML IN SOLN
3.0000 mL | RESPIRATORY_TRACT | Status: DC
  Administered 2014-03-07 – 2014-03-09 (×11): 3 mL via RESPIRATORY_TRACT
  Filled 2014-03-07 (×12): qty 3

## 2014-03-07 MED ORDER — DEXTROSE 5 % IV SOLN
100.0000 mg | Freq: Two times a day (BID) | INTRAVENOUS | Status: DC
  Administered 2014-03-07 – 2014-03-09 (×4): 100 mg via INTRAVENOUS
  Filled 2014-03-07 (×5): qty 100

## 2014-03-07 MED ORDER — ALBUTEROL (5 MG/ML) CONTINUOUS INHALATION SOLN
INHALATION_SOLUTION | RESPIRATORY_TRACT | Status: AC
  Filled 2014-03-07: qty 20

## 2014-03-07 MED ORDER — IOHEXOL 350 MG/ML SOLN
100.0000 mL | Freq: Once | INTRAVENOUS | Status: AC | PRN
  Administered 2014-03-07: 100 mL via INTRAVENOUS

## 2014-03-07 MED ORDER — METHYLPREDNISOLONE SODIUM SUCC 125 MG IJ SOLR
80.0000 mg | Freq: Three times a day (TID) | INTRAMUSCULAR | Status: DC
  Administered 2014-03-07 – 2014-03-09 (×5): 80 mg via INTRAVENOUS
  Filled 2014-03-07 (×8): qty 1.28

## 2014-03-07 MED ORDER — METHYLPREDNISOLONE SODIUM SUCC 125 MG IJ SOLR
125.0000 mg | Freq: Once | INTRAMUSCULAR | Status: AC
  Administered 2014-03-07: 125 mg via INTRAVENOUS
  Filled 2014-03-07: qty 2

## 2014-03-07 MED ORDER — HEPARIN SODIUM (PORCINE) 5000 UNIT/ML IJ SOLN
5000.0000 [IU] | Freq: Three times a day (TID) | INTRAMUSCULAR | Status: DC
  Administered 2014-03-07 – 2014-03-09 (×5): 5000 [IU] via SUBCUTANEOUS
  Filled 2014-03-07 (×8): qty 1

## 2014-03-07 NOTE — ED Notes (Signed)
Reassumed charting in EPIC at this time, see downtime form.
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
## 2014-03-07 NOTE — ED Notes (Signed)
Initial contact - pt reports cough/SOB "for years", being followed by PCP and pulm for same.  Pt reports was at PCP office this AM and o2 "was in 80s". Pt with cough prod yellow sputum.  Pt also reports subjective fevers.  Skin PWD.  Speaking full/clear sentences, rr even/un-lab.  NAD.

## 2014-03-07 NOTE — Progress Notes (Signed)
   Subjective:    Patient ID: Kiara [name], female    DOB: 1951/07/17, 63 y.o.   MRN: [id number]  HPI Pt seen 02/08/14 in office for chronic cough of 3 years and SOB. Her CXR showed stable sarcoidosis changes. Her D-dimer was elevated to 1.09. Her O2 saturation was 88%.   After she completed the Levaquin and prednisone from 02/08/14 her cough improved and her fevers subsided for about one week before returning.    Today the pt presents with similar symptoms. She's having the same cough, SOB, and fevers and chills. Her cough [facility] as a neurogenic cough with gabapentin. Her cough is productive with thickened secretions of yellow-green sputum. The cough is worse with any sort of activity. There is no nasal congestion, sore throat, or sinus pressure associated with the cough.   The pt believes the cough is associated with GERD symptoms, despite being seen 2/15 by Dr. Dickie LaBrody and completing a barium esophogram that showed no acid reflux.   She's experiencing SOB and a frontal headache she attributes to the cough. She is using symbicort 2 puffs BID and albuterol on average 2x/day. The pt does have asthma history.     Review of Systems  HENT: Negative for congestion, ear discharge, ear pain, postnasal drip, rhinorrhea, sinus pressure and sneezing.   Cardiovascular: Negative for chest pain and leg swelling.      Objective:   Physical Exam      Assessment & Plan:

## 2014-03-07 NOTE — ED Provider Notes (Signed)
CSN: [id number]     Arrival date & time 03/07/14  1317 History   First MD Initiated Contact with Patient 03/07/14 1321     Chief Complaint  Patient presents with  . Shortness of Breath     (Consider location/radiation/quality/duration/timing/severity/associated sxs/prior Treatment) The history is provided by the patient.  [name] is a 63 y.o. female hx of GERD, barrett's esophagus, hiatal hernia, pulmonary sarcoidosis her ewith SOB. Shortness of breath for the last 2 years. Has been getting workup outpatient and was recently diagnosed with barrett's esophagus, GERD. She has been having worsening shortness of breath over the last 10 days. Also some productive cough. Went to PMD today and was sent in for hypoxia.   Level V caveat- condition of patient      Past Medical History  Diagnosis Date  . GERD (gastroesophageal reflux disease)   . Barrett esophagus   . Pulmonary sarcoidosis   . Hiatal hernia   . Asthma   . Migraines   . IBS (irritable bowel syndrome)   . Anxiety    Past Surgical History  Procedure Laterality Date  . Cholecystectomy    . Rotator cuff repair      x3(2 on L, 1 on R)  . Cataract extraction, bilateral    . Bronchial biopsy  1996  . Nissen fundoplication    . Bravo ph study N/A 10/10/2013    Procedure: BRAVO PH STUDY;  Surgeon: Hart Carwinora M Brodie, MD;  Location: [facility];  Service: Endoscopy;  Laterality: N/A;  placed 29  . Esophagogastroduodenoscopy N/A 10/10/2013    Procedure: ESOPHAGOGASTRODUODENOSCOPY (EGD);  Surgeon: Hart Carwinora M Brodie, MD;  [facility];  Service: Endoscopy;  Laterality: N/A;  pt asthmatic, RA 92% at rest.  Coughing frequently. Wheezes noted in upper lung fields at auscultation. Pt instructed to use home inhaler prior to procedure and placed on supportive O2 @ 2 lpm in pre-procedural. Teaching done ie. use of inhaler. Pt states she ju   Family History  Problem Relation Age of Onset  . Adopted: Yes   History  Substance Use  Topics  . Smoking status: Former Smoker -- 1.00 packs/day for 20 years    Types: Cigarettes    Quit date: 09/08/1978  . Smokeless tobacco: Never Used  . Alcohol Use: No   OB History   Grav Para Term Preterm Abortions TAB SAB Ect Mult Living                 Review of Systems  Respiratory: Positive for shortness of breath.   All other systems reviewed and are negative.     Allergies  Penicillins and Aspirin  Home Medications   Prior to Admission medications   Medication Sig Start Date End Date Taking? Authorizing Provider  albuterol (PROVENTIL HFA;VENTOLIN HFA) 108 (90 BASE) MCG/ACT inhaler Inhale 2 puffs into the lungs every 6 (six) hours as needed for wheezing or shortness of breath.   Yes Historical Provider, MD  Ascorbic Acid (VITAMIN C) 1000 MG tablet Take 1,000 mg by mouth every morning.    Yes Historical Provider, MD  ASTRAGALUS PO Take 30 drops by mouth every morning.    Yes Historical Provider, MD  budesonide-formoterol (SYMBICORT) 80-4.5 MCG/ACT inhaler Inhale 2 puffs into the lungs 2 (two) times daily.   Yes Historical Provider, MD  cyanocobalamin 500 MCG tablet Take 500 mcg by mouth every morning.   Yes Historical Provider, MD  cycloSPORINE (RESTASIS) 0.05 % ophthalmic emulsion Place 1 drop into both  eyes 2 (two) times daily.    Yes Historical Provider, MD  Echinacea 450 MG CAPS Take 1 capsule by mouth every morning.    Yes Historical Provider, MD  fluticasone (FLONASE) 50 MCG/ACT nasal spray Place 1 spray into both nostrils every morning.    Yes Historical Provider, MD  gabapentin (NEURONTIN) 100 MG capsule Take 100 mg by mouth 3 (three) times daily.   Yes Historical Provider, MD  Garlic Oil (ODORLESS GARLIC) 1000 MG CAPS Take 1,000 mg by mouth every morning.   Yes Historical Provider, MD  Ketotifen Fumarate (THERA TEARS ALLERGY OP) Place 1 drop into both eyes 2 (two) times daily.    Yes Historical Provider, MD  loratadine (CLARITIN) 10 MG tablet Take 10 mg by mouth  every morning.    Yes Historical Provider, MD  LORazepam (ATIVAN) 0.5 MG tablet Take 0.5 mg by mouth every 8 (eight) hours as needed for anxiety.   Yes Historical Provider, MD  Multiple Vitamin (MULTIVITAMIN) capsule Take 1 capsule by mouth every morning.    Yes Historical Provider, MD  multivitamin-lutein (OCUVITE-LUTEIN) CAPS capsule Take 1 capsule by mouth every morning.    Yes Historical Provider, MD  omega-3 acid ethyl esters (LOVAZA) 1 G capsule Take 1 g by mouth every morning.   Yes Historical Provider, MD  Probiotic Product (PROBIOTIC DAILY) CAPS Take 1 capsule by mouth every morning.    Yes Historical Provider, MD  vitamin E 400 UNIT capsule Take 400 Units by mouth every morning.    Yes Historical Provider, MD   BP 114/64  Pulse 116  Temp(Src) 99 F (37.2 C) (Oral)  Resp 22  SpO2 96% Physical Exam  Nursing note and vitals reviewed. Constitutional: She is oriented to person, place, and time.  Tachypneic, talking in 2-3 word sentences   HENT:  Head: Normocephalic.  Mouth/Throat: Oropharynx is clear and moist.  Eyes: Conjunctivae are normal. Pupils are equal, round, and reactive to light.  Neck: Normal range of motion. Neck supple.  Cardiovascular: Regular rhythm and normal heart sounds.   Tachycardic   Pulmonary/Chest:  Tachypneic, mild diffuse wheezing. Some accessory muscle use   Abdominal: Soft. Bowel sounds are normal. She exhibits no distension. There is no tenderness. There is no rebound.  Musculoskeletal: Normal range of motion. She exhibits no edema and no tenderness.  Neurological: She is alert and oriented to person, place, and time. No cranial nerve deficit. Coordination normal.  Skin: Skin is warm and dry.  Psychiatric: She has a normal mood and affect. Her behavior is normal. Judgment and thought content normal.    ED Course  Procedures (including critical care time) Labs Review Labs Reviewed  CBC WITH DIFFERENTIAL - Abnormal; Notable for the following:     Neutrophils Relative % 41 (*)    Monocytes Relative 13 (*)    Basophils Relative 5 (*)    Basophils Absolute 0.3 (*)    All other components within normal limits  COMPREHENSIVE METABOLIC PANEL - Abnormal; Notable for the following:    Potassium 3.6 (*)    GFR calc non Af Amer 58 (*)    GFR calc Af Amer 67 (*)    All other components within normal limits  PROTIME-INR  I-STAT CG4 LACTIC ACID, ED  Rosezena SensorI-STAT TROPOININ, ED    Imaging Review Dg Chest Portable 1 View  03/07/2014   CLINICAL DATA:  Short of breath, history sarcoidosis  EXAM: PORTABLE CHEST - 1 VIEW  COMPARISON:  02/08/2014  FINDINGS: Underlying diffuse pulmonary  scarring secondary to sarcoidosis. Right hilar and probable left hilar adenopathy also noted.  Negative for heart failure or pneumonia.  No pleural effusion.  IMPRESSION: Diffuse pulmonary scarring and hilar adenopathy compatible with sarcoidosis. No superimposed acute abnormality.   Electronically Signed   By: Marlan Palau M.D.   On: 03/07/2014 14:17     EKG Interpretation   Date/Time:  Tuesday March 07 2014 13:35:47 EDT Ventricular Rate:  114 PR Interval:  176 QRS Duration: 89 QT Interval:  350 QTC Calculation: 482 R Axis:   52 Text Interpretation:  Sinus tachycardia Probable left atrial enlargement  Borderline T abnormalities, anterior leads Baseline wander in lead(s) II  III aVF Since last tracing rate faster Confirmed by YAO  MD, DAVID ([id number])  on 03/07/2014 1:43:20 PM      MDM   Final diagnoses:  None    [name] is a 63 y.o. female here with SOB, tachycardia. Concerned for possible PE. Also has symptoms of bronchitis. Will get ct angio chest, give nebs and steroids. Will likely need admission.   4:04 PM Labs unremarkable. CXR showed sarcoidosis. CT angio showed no PE, just sarcoid. I called Dr. Sunnie Nielsen, who recommend pulmonary admission. I talked to pulm critical care, will evaluate patient.    Richardean Canal, MD 03/07/14 [phone]

## 2014-03-07 NOTE — Progress Notes (Signed)
Pre visit review using our clinic review tool, if applicable. No additional management support is needed unless otherwise documented below in the visit note. 

## 2014-03-07 NOTE — Progress Notes (Signed)
   Subjective:    Patient ID: [name], female    DOB: 1951/02/09, 89[name]3 y.o.   MRN: [id number]  HPI  She was treated with prednisone & Levaquin 02/08/14 with some improvement in her productive cough and fever. She was only improved however for approximately one week. Now symptoms have exacerbated and she has profound exertional dyspnea. This is despite using the Symbicort 2 puffs twice a day and albuterol on average 2 times a day.  After descending 15 stairs to her kitchen she must sit for a period time before she attempts to climb the stairs.  She describes thick, yellow to green sputum.  She is concerned the symptoms are related to GERD. A barium swallow  was apparently negative. She's also been treated for possible neurogenic cough [facility].    Review of Systems  She denies facial pain, nasal purulence, dental pain, sore throat, otic pain, otic discharge. She does have frontal headache which is worse with a cough.       Objective:   Physical Exam  Significant or distinguishing  findings on physical exam are documented first.  Below that are other systems examined & findings.   She's dyspneic at rest with respiratory rate 28. O2 sats 82%.  Sclerae are injected.  She has an upper partial  This juicy rales in all lung fields.  She exhibits a resting tachycardia with slight flow murmur. Heart rate is 132.  Clubbing of the nailbeds suggested.  General appearance:well nourished;  increased work of breathing is present.  No  lymphadenopathy about the head, neck, or axilla noted. Some weight excess.  Eyes: No conjunctival inflammation or lid edema is present. There is no scleral icterus.  Ears:  External ear exam shows no significant lesions or deformities.  Otoscopic examination reveals clear canals, tympanic membranes are intact bilaterally without bulging, retraction, inflammation or discharge.  Nose:  External nasal examination shows no deformity  or inflammation. Nasal mucosa are pink and moist without lesions or exudates. No septal dislocation or deviation.No obstruction to airflow.   Oral exam:  lips and gums are healthy appearing.There is no oropharyngeal erythema or exudate noted.   Neck:  No deformities, thyromegaly, masses, or tenderness noted.       Extremities:  No cyanosis, edema    Skin: Warm & dry w/o jaundice or tenting.         Assessment & Plan:  #1 acute on chronic respiratory insufficiency #2 elevated D dimer. PTE must be considered in differential  Hospitalization is mandatory as she is at high risk for severe cardiopulmonary compromise.

## 2014-03-07 NOTE — Patient Instructions (Signed)
To ER now.  

## 2014-03-07 NOTE — ED Notes (Addendum)
Pt being sent by PCP, Alwyn RenHopper MD.  C/o cough x "several years" and fever, tachycardia, and SOB x 10 days.  Office vitals: 82% O2 sat, HR 130s, and RR 10.  Hx of asthma and neurogenic cough.

## 2014-03-07 NOTE — Progress Notes (Signed)
Utilization Review completed.  Amy Ferrero RN CM  

## 2014-03-07 NOTE — ED Notes (Signed)
Pt insistent on ambulating to the BR, pt very SOB with exertion with sats dropped into the 70s.  Sats returned to upper 90s on [place] and pt denies SOB.  EDP aware.

## 2014-03-08 DIAGNOSIS — R05 Cough: Secondary | ICD-10-CM

## 2014-03-08 DIAGNOSIS — K2100 Gastro-esophageal reflux disease with esophagitis, without bleeding: Secondary | ICD-10-CM

## 2014-03-08 DIAGNOSIS — R059 Cough, unspecified: Secondary | ICD-10-CM

## 2014-03-08 LAB — BASIC METABOLIC PANEL
BUN: 5 mg/dL — ABNORMAL LOW (ref 6–23)
CO2: 21 mEq/L (ref 19–32)
Calcium: 9 mg/dL (ref 8.4–10.5)
Chloride: 102 mEq/L (ref 96–112)
Creatinine, Ser: 0.8 mg/dL (ref 0.50–1.10)
GFR, EST AFRICAN AMERICAN: 88 mL/min — AB (ref >90)
GFR, EST NON AFRICAN AMERICAN: 76 mL/min — AB (ref >90)
Glucose, Bld: 157 mg/dL — ABNORMAL HIGH (ref 70–99)
Potassium: 3.8 mEq/L (ref 3.7–5.3)
Sodium: 141 mEq/L (ref 137–147)

## 2014-03-08 LAB — PHOSPHORUS: Phosphorus: 2.3 mg/dL (ref 2.3–4.6)

## 2014-03-08 LAB — CBC
HCT: 37.4 % (ref 36.0–46.0)
Hemoglobin: 12.2 g/dL (ref 12.0–15.0)
MCH: 29.8 pg (ref 26.0–34.0)
MCHC: 32.6 g/dL (ref 30.0–36.0)
MCV: 91.2 fL (ref 78.0–100.0)
Platelets: 257 10*3/uL (ref 150–400)
RBC: 4.1 MIL/uL (ref 3.87–5.11)
RDW: 14 % (ref 11.5–15.5)
WBC: 6.2 10*3/uL (ref 4.0–10.5)

## 2014-03-08 LAB — TROPONIN I: Troponin I: <0.3 ng/mL (ref <0.30)

## 2014-03-08 LAB — PROCALCITONIN: Procalcitonin: <0.1 ng/mL

## 2014-03-08 LAB — RHEUMATOID FACTOR: Rhuematoid fact SerPl-aCnc: 44 IU/mL — ABNORMAL HIGH (ref <=14)

## 2014-03-08 LAB — PRO B NATRIURETIC PEPTIDE: Pro B Natriuretic peptide (BNP): 274.4 pg/mL — ABNORMAL HIGH (ref 0–125)

## 2014-03-08 LAB — CYCLIC CITRUL PEPTIDE ANTIBODY, IGG: Cyclic Citrullin Peptide Ab: <2 U/mL (ref 0.0–5.0)

## 2014-03-08 LAB — ANA: Anti Nuclear Antibody(ANA): NEGATIVE

## 2014-03-08 LAB — MAGNESIUM: Magnesium: 2 mg/dL (ref 1.5–2.5)

## 2014-03-08 MED ORDER — CYCLOSPORINE 0.05 % OP EMUL
1.0000 [drp] | Freq: Two times a day (BID) | OPHTHALMIC | Status: DC
Start: 2014-03-08 — End: 2014-03-09
  Administered 2014-03-08 – 2014-03-09 (×3): 1 [drp] via OPHTHALMIC
  Filled 2014-03-08 (×4): qty 1

## 2014-03-08 MED ORDER — PANTOPRAZOLE SODIUM 40 MG PO TBEC
40.0000 mg | DELAYED_RELEASE_TABLET | Freq: Every day | ORAL | Status: DC
  Administered 2014-03-08 – 2014-03-09 (×2): 40 mg via ORAL
  Filled 2014-03-08 (×2): qty 1

## 2014-03-08 MED ORDER — LORAZEPAM 0.5 MG PO TABS
0.5000 mg | ORAL_TABLET | Freq: Three times a day (TID) | ORAL | Status: DC | PRN
  Administered 2014-03-08: 0.5 mg via ORAL
  Filled 2014-03-08: qty 1
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
## 2014-03-08 NOTE — Progress Notes (Signed)
On 2L [place], pt's O2 sat is 97%. Ambulating on RA, pt's O2 sat is between 75-80%.  Pt placed back on 2L of O2.

## 2014-03-09 DIAGNOSIS — J45909 Unspecified asthma, uncomplicated: Secondary | ICD-10-CM

## 2014-03-09 LAB — RESPIRATORY VIRUS PANEL
ADENOVIRUS: NOT DETECTED
INFLUENZA A: NOT DETECTED
Influenza A H1: NOT DETECTED
Influenza A H3: NOT DETECTED
Influenza B: NOT DETECTED
Metapneumovirus: NOT DETECTED
PARAINFLUENZA 1 A: NOT DETECTED
PARAINFLUENZA 2 A: NOT DETECTED
PARAINFLUENZA 3 A: NOT DETECTED
Respiratory Syncytial Virus A: NOT DETECTED
Respiratory Syncytial Virus B: NOT DETECTED
Rhinovirus: NOT DETECTED

## 2014-03-09 LAB — BASIC METABOLIC PANEL
Anion gap: 13 (ref 5–15)
BUN: 10 mg/dL (ref 6–23)
CHLORIDE: 105 meq/L (ref 96–112)
CO2: 24 meq/L (ref 19–32)
Calcium: 9.2 mg/dL (ref 8.4–10.5)
Creatinine, Ser: 0.92 mg/dL (ref 0.50–1.10)
GFR calc Af Amer: 74 mL/min — ABNORMAL LOW (ref >90)
GFR calc non Af Amer: 64 mL/min — ABNORMAL LOW (ref >90)
GLUCOSE: 152 mg/dL — AB (ref 70–99)
POTASSIUM: 4 meq/L (ref 3.7–5.3)
SODIUM: 142 meq/L (ref 137–147)

## 2014-03-09 LAB — URINE CULTURE

## 2014-03-09 MED ORDER — TRAMADOL HCL 50 MG PO TABS: 50.0000 mg | ORAL_TABLET | Freq: Four times a day (QID) | ORAL | Status: DC | PRN

## 2014-03-09 MED ORDER — DOXYCYCLINE HYCLATE 50 MG PO CAPS: 100.0000 mg | ORAL_CAPSULE | Freq: Two times a day (BID) | ORAL | Status: DC

## 2014-03-09 MED ORDER — PREDNISONE 5 MG PO TABS: 5.0000 mg | ORAL_TABLET | Freq: Every day | ORAL | Status: DC

## 2014-03-09 NOTE — Progress Notes (Signed)
SATURATION QUALIFICATIONS: (This note is used to comply with regulatory documentation for home oxygen)  Patient Saturations on Room Air at Rest = 93%  Patient Saturations on Room Air while Ambulating = 85%  Patient Saturations on 2 Liters of oxygen while Ambulating = 97%  Please briefly explain why patient needs home oxygen: pt desat to 85% on RA while ambulating

## 2014-03-09 NOTE — Progress Notes (Signed)
[facility]   [facility] is providing the following services: Oxygen  If patient discharges after hours, please call [phone].   [name] 03/09/2014, 1:02 PM

## 2014-03-09 NOTE — Progress Notes (Signed)
Pt d/c home with spouse, d/c instructions reviewed with pt. Pt verbalized understanding.

## 2014-03-09 NOTE — Discharge Summary (Signed)
She is overall improved, but will go home on oxygen  After extensive review of her chart, it looks like she had a Nissen in 2006 and a barium swallow in 2015 confirmed that it was still intact.    Is this a viral infection? Mild asthma flare?  Not really clear but she is better.  Our CT scans shows severe fibrosis but nothing to suggest and acute parenchymal process like pneumonia or acute inflammation.  So she is now on oxygen, but this isn't a huge surprise considering the degree of fibrosis.  Will treat with a prednisone taper (asthma vs less likely sarcoid flare) and a one week course of doxycycline.  F/u in our office 2 weeks with Tammy Parrett or Dr. Carney LivingWert  Brent McQuaid, MD [place] [facility] Pager: [phone] Cell: [phone] If no response, call [phone]

## 2014-03-09 NOTE — Discharge Summary (Signed)
Physician Discharge Summary  Patient ID: [name] MRN: [id number] DOB/AGE: 1950/11/09 63 y.o.  Admit date: 03/07/2014 Discharge date: 03/09/2014    Discharge Diagnoses:  Acute on chronic hypoxemic respiratory failure Baseline stage 4 sarcoid Chronic cough GERD                                                                     DISCHARGE PLAN BY DIAGNOSIS     Acute on chronic hypoxemic respiratory failure Baseline stage 4 sarcoid Chronic cough Discharge Plan: - Supplemental home O2. - Prednisone taper. - Continue 7 days of doxycycline. - Tramadol 77m q6h. - Respiratory virus panel results pending. - F/u appt with Pulmonary as scheduled below.  GERD Discharge Plan: - Continue PPI (over the counter).                DISCHARGE SUMMARY   Kiara STIGGERSis a 63y.o. y/o AA female with a PMH of Pulmonary Sarcoidosis, Asthma, GERD, Barrett's, Hiatal hernia, Migraines, IBS, Anxiety.  She was seen by her PCP (Linna Darner on 6/30 for cough, fever, tachycardia, SOB x 10 days.  In office, SpO2 82%.  PCP sent her to ED for further evaluation. In ED, CT angio was obtained and was negative for PE and demonstrated findings c/w sarcoidosis.  She was treated with nebulizers, put on supplemental O2, [facility] started on IV steroids and given empiric abx (doxycycline).  Autoimmune panel was collected and results are pending. On 7/2, her symptoms were markedly improved and she was deemed safe for discharge home.  She is scheduled for a follow up outpatient visit with pulmonary as noted below.           SIGNIFICANT DIAGNOSTIC STUDIES / EVENTS 6/30 admit  6/30 CTA >>> neg for PE , cystic lucencies/fibrosis in a perilymphatic/peribronchovascular distribution with subpleural reticulation/fibrosis, related to patient's known sarcoidosis.  MICRO DATA  Blood 6/30 >>> ngtd Urine 6/30 >>> ? contaminant Autoimmune Panel 6/30 >>> RF high, ANA neg, CCP and HP in  process.  ANTIBIOTICS Doxycycline 6/30 >>> (will be discharged with 7 days outpatient therapy).  CONSULTS  TUBES / LINES PIV   Discharge Exam: General: Pleasant female, resting in bed, in NAD, receiving breathing treatment. Neuro: A&O x 3, non-focal.  HEENT: [place]/AT. PERRL, sclerae anicteric. Cardiovascular: RRR, no M/R/G.  Lungs: Respirations even and unlabored.  Diffuse wheezing throughout.  No R/R. Abdomen: BS x 4, soft, NT/ND.  Musculoskeletal: No gross deformities, no edema.  Skin: Intact, warm, no rashes.    Filed Vitals:   03/09/14 0530 03/09/14 0534 03/09/14 0744 03/09/14 1121  BP: 100/62     Pulse: 101     Temp: 97.9 F (36.6 C)     TempSrc: Oral     Resp: 18     Height:      Weight:  93.169 kg (205 lb 6.4 oz)    SpO2: 100%  99% 99%     Discharge Labs  BMET  Recent Labs Lab 03/07/14 1345 03/08/14 0146 03/09/14 0455  NA 141 141 142  K 3.6* 3.8 4.0  CL 104 102 105  CO2 _0 GLUCOSE 91 157* 152*  BUN 6 5* 10  CREATININE 1.00 0.80 0.92  CALCIUM 9.0 9.0 9.2  MG  --  2.0  --   PHOS  --  2.3  --     CBC  Recent Labs Lab 03/07/14 1345 03/08/14 0146  HGB 13.0 12.2  HCT 39.7 37.4  WBC 6.4 6.2  PLT 252 257    Anti-Coagulation  Recent Labs Lab 03/07/14 1345  INR 0.97    Discharge Instructions   Call MD for:  difficulty breathing, headache or visual disturbances    Complete by:  As directed      Call MD for:  extreme fatigue    Complete by:  As directed      Call MD for:  persistant dizziness or light-headedness    Complete by:  As directed      Call MD for:  temperature >100.4    Complete by:  As directed      Diet - low sodium heart healthy    Complete by:  As directed      Increase activity slowly    Complete by:  As directed                 Follow-up Information   Follow up with PARRETT,TAMMY, NP On 03/23/2014. (Your appointment is at 10:45AM.)    Specialty:  Nurse Practitioner   Contact information:   [address] [place] [address] [phone]          Medication List         albuterol 108 (90 BASE) MCG/ACT inhaler  Commonly known as:  PROVENTIL HFA;VENTOLIN HFA  Inhale 2 puffs into the lungs every 6 (six) hours as needed for wheezing or shortness of breath.     ASTRAGALUS PO  Take 30 drops by mouth every morning.     budesonide-formoterol 80-4.5 MCG/ACT inhaler  Commonly known as:  SYMBICORT  Inhale 2 puffs into the lungs 2 (two) times daily.     cyanocobalamin 500 MCG tablet  Take 500 mcg by mouth every morning.     cycloSPORINE 0.05 % ophthalmic emulsion  Commonly known as:  RESTASIS  Place 1 drop into both eyes 2 (two) times daily.     doxycycline 50 MG capsule  Commonly known as:  VIBRAMYCIN  Take 2 capsules (100 mg total) by mouth 2 (two) times daily.     Echinacea 450 MG Caps  Take 1 capsule by mouth every morning.     fluticasone 50 MCG/ACT nasal spray  Commonly known as:  FLONASE  Place 1 spray into both nostrils every morning.     gabapentin 100 MG capsule  Commonly known as:  NEURONTIN  Take 100 mg by mouth 3 (three) times daily.     loratadine 10 MG tablet  Commonly known as:  CLARITIN  Take 10 mg by mouth every morning.     LORazepam 0.5 MG tablet  Commonly known as:  ATIVAN  Take 0.5 mg by mouth every 8 (eight) hours as needed for anxiety.     multivitamin capsule  Take 1 capsule by mouth every morning.     multivitamin-lutein Caps capsule  Take 1 capsule by mouth every morning.     ODORLESS GARLIC 8563 MG Caps  Generic drug:  Garlic Oil  Take 1,497 mg by mouth every morning.     omega-3 acid ethyl esters 1 G capsule  Commonly known as:  LOVAZA  Take 1 g by mouth every morning.     predniSONE 5 MG tablet  Commonly known as:  DELTASONE  Take 1 tablet (5 mg total) by mouth daily with breakfast.     PROBIOTIC DAILY Caps  Take 1 capsule by mouth every morning.     THERA TEARS ALLERGY OP  Place 1 drop into both eyes 2 (two) times  daily.     traMADol 50 MG tablet  Commonly known as:  ULTRAM  Take 1 tablet (50 mg total) by mouth every 6 (six) hours as needed.     vitamin C 1000 MG tablet  Take 1,000 mg by mouth every morning.     vitamin E 400 UNIT capsule  Take 400 Units by mouth every morning.          Disposition: Home.  Discharged Condition: [name] has met maximum benefit of inpatient care and is medically stable and cleared for discharge.  Patient is pending follow up as above.      Time spent on disposition:  Greater than 45 minutes.   Montey Hora, [facility] Pgr: [phone]  or [phone]

## 2014-03-10 NOTE — Progress Notes (Signed)
CARE MANAGEMENT NOTE 03/10/2014  Patient:  Margaretha SeedsHYLTON,Roselin V   Account Number:  [id number]  Date Initiated:  03/10/2014  Documentation initiated by:  Ezekiel InaMcGIBBONEY,COOKIE  Subjective/Objective Assessment:   pt admitted with with cco SOB     Action/Plan:   from home   Anticipated DC Date:  03/09/2014   Anticipated DC Plan:  HOME/SELF CARE      DC Planning Services  CM consult      Choice offered to / List presented to:     DME arranged  [facility]      DME agency  [facility]        Status of service:  In process, will continue to follow Medicare Important Message given?   (If response is "NO", the following Medicare IM given date fields will be blank) Date Medicare IM given:   Medicare IM given by:   Date Additional Medicare IM given:   Additional Medicare IM given by:    Discharge Disposition:    Per UR Regulation:  Reviewed for med. necessity/level of care/duration of stay  If discussed at Long Length of Stay Meetings, dates discussed:    Comments:  03/10/14 [facility], RN, BSN Chart reviewed.

## 2014-03-13 ENCOUNTER — Telehealth: Payer: Self-pay | Admitting: Internal Medicine

## 2014-03-13 LAB — CULTURE, BLOOD (ROUTINE X 2)
CULTURE: NO GROWTH
Culture: NO GROWTH

## 2014-03-13 NOTE — Telephone Encounter (Signed)
Pt stated that she was placed on a 6 week prednisone taper.  She stated that she has been constipated since she started on the taper and wanted to see if she could take the metamucil or should she try something else to help with the constipation.  MW please advise. Thanks  Allergies  Allergen Reactions  . Penicillins Other (See Comments)    Unknown - childhood allergy  . Aspirin Other (See Comments)    gastritis    Current Outpatient Prescriptions on File Prior to Visit  Medication Sig Dispense Refill  . albuterol (PROVENTIL HFA;VENTOLIN HFA) 108 (90 BASE) MCG/ACT inhaler Inhale 2 puffs into the lungs every 6 (six) hours as needed for wheezing or shortness of breath.      . Ascorbic Acid (VITAMIN C) 1000 MG tablet Take 1,000 mg by mouth every morning.       . ASTRAGALUS PO Take 30 drops by mouth every morning.       . budesonide-formoterol (SYMBICORT) 80-4.5 MCG/ACT inhaler Inhale 2 puffs into the lungs 2 (two) times daily.      . cyanocobalamin 500 MCG tablet Take 500 mcg by mouth every morning.      . cycloSPORINE (RESTASIS) 0.05 % ophthalmic emulsion Place 1 drop into both eyes 2 (two) times daily.       Marland Kitchen. doxycycline (VIBRAMYCIN) 50 MG capsule Take 2 capsules (100 mg total) by mouth 2 (two) times daily.  7 capsule  0  . Echinacea 450 MG CAPS Take 1 capsule by mouth every morning.       . fluticasone (FLONASE) 50 MCG/ACT nasal spray Place 1 spray into both nostrils every morning.       . gabapentin (NEURONTIN) 100 MG capsule Take 100 mg by mouth 3 (three) times daily.      . Garlic Oil (ODORLESS GARLIC) 1000 MG CAPS Take 1,000 mg by mouth every morning.      Marland Kitchen. Ketotifen Fumarate (THERA TEARS ALLERGY OP) Place 1 drop into both eyes 2 (two) times daily.       Marland Kitchen. loratadine (CLARITIN) 10 MG tablet Take 10 mg by mouth every morning.       Marland Kitchen. LORazepam (ATIVAN) 0.5 MG tablet Take 0.5 mg by mouth every 8 (eight) hours as needed for anxiety.      . Multiple Vitamin (MULTIVITAMIN) capsule Take 1  capsule by mouth every morning.       . multivitamin-lutein (OCUVITE-LUTEIN) CAPS capsule Take 1 capsule by mouth every morning.       Marland Kitchen. omega-3 acid ethyl esters (LOVAZA) 1 G capsule Take 1 g by mouth every morning.      . predniSONE (DELTASONE) 5 MG tablet Take 1 tablet (5 mg total) by mouth daily with breakfast.  196 tablet  0  . Probiotic Product (PROBIOTIC DAILY) CAPS Take 1 capsule by mouth every morning.       . traMADol (ULTRAM) 50 MG tablet Take 1 tablet (50 mg total) by mouth every 6 (six) hours as needed.  30 tablet  0  . vitamin E 400 UNIT capsule Take 400 Units by mouth every morning.        No current facility-administered medications on file prior to visit.

## 2014-03-13 NOTE — Telephone Encounter (Signed)
Called, spoke with pt -  Informed her of below per MW.  She verbalized understanding.   Pt had a HFU scheduled already for July 16 with TP. She would like to reschedule this with MW. July 16 appt was canceled and appt rescheduled with MW for July 13 at 11:45 am per pt's request.  Pt aware and is to call back if she has any further questions or concerns prior to appt.

## 2014-03-13 NOTE — Telephone Encounter (Signed)
Metamucil daily is fine and add mom 1-2tbsp daily prn  Reduce prednisone to 20 mg daily and see me w/in 2 weeks

## 2014-03-14 LAB — HYPERSENSITIVITY PNUEMONITIS PROFILE

## 2014-03-20 ENCOUNTER — Encounter: Payer: Self-pay | Admitting: Internal Medicine

## 2014-03-20 ENCOUNTER — Ambulatory Visit (INDEPENDENT_AMBULATORY_CARE_PROVIDER_SITE_OTHER): Payer: BC Managed Care – PPO | Admitting: Internal Medicine

## 2014-03-20 VITALS — BP 122/66 | HR 110 | Temp 99.2°F | Ht 65.0 in | Wt 214.0 lb

## 2014-03-20 DIAGNOSIS — D869 Sarcoidosis, unspecified: Secondary | ICD-10-CM

## 2014-03-20 DIAGNOSIS — R058 Other specified cough: Secondary | ICD-10-CM

## 2014-03-20 DIAGNOSIS — J45909 Unspecified asthma, uncomplicated: Secondary | ICD-10-CM

## 2014-03-20 DIAGNOSIS — R059 Cough, unspecified: Secondary | ICD-10-CM

## 2014-03-20 DIAGNOSIS — R05 Cough: Secondary | ICD-10-CM

## 2014-03-20 MED ORDER — GABAPENTIN 300 MG PO CAPS: 300.0000 mg | ORAL_CAPSULE | Freq: Three times a day (TID) | ORAL | Status: DC

## 2014-03-20 MED ORDER — TRAMADOL HCL 50 MG PO TABS: 50.0000 mg | ORAL_TABLET | Freq: Four times a day (QID) | ORAL | Status: DC | PRN

## 2014-03-20 NOTE — Patient Instructions (Addendum)
Prednisone reduce to  10 mg daily  Stop symbicort and just use up to 12 hours as needed for your breathing not coughing  Gabapentin increase to 300 mg three times a day   GERD (REFLUX)  is an extremely common cause of respiratory symptoms, many times with no significant heartburn at all.    It can be treated with medication, but also with lifestyle changes including avoidance of late meals, excessive alcohol, smoking cessation, and avoid fatty foods, chocolate, peppermint, colas, red wine, and acidic juices such as orange juice.  NO MINT OR MENTHOL PRODUCTS SO NO COUGH DROPS  USE SUGARLESS CANDY INSTEAD (jolley ranchers or Stover's)  NO OIL BASED VITAMINS - use powdered substitutes.   Take delsym two tsp every 12 hours and supplement if needed with  tramadol 50 mg up to 2 every 4 hours to suppress the urge to cough. Swallowing water or using ice chips/non mint and menthol containing candies (such as lifesavers or sugarless jolly ranchers) are also effective.  You should rest your voice and avoid activities that you know make you cough.  Once you have eliminated the cough for 3 straight days try reducing the tramadol first,  then the delsym as tolerated.    See Tammy NP w/in 2 weeks with all your medications, even over the counter meds, separated in two separate bags, the ones you take no matter what vs the ones you stop once you feel better and take only as needed when you feel you need them.   Tammy  will generate for you a new user friendly medication calendar that will put us all on the same page re: your medication use.     Without this process, it simply isn't possible to assure that we are providing  your outpatient care  with  the attention to detail we feel you deserve.   If we cannot assure that you're getting that kind of care,  then we cannot manage your problem effectively from this clinic.  Once you have seen Tammy and we are sure that we're all on the same page with your medication  use she will arrange follow up with me.

## 2014-03-20 NOTE — Assessment & Plan Note (Addendum)
Onset: late teens Triggers (environmental, infectious, allergic): foods, odors Rescue inhaler VHQ:IONGEXuse:rarely Maintenance medications/ response: Symbicort twice a day Smoking history:quit 1973 Family history pulmonary disease: adopted  - hfa 75% 07/11/2013  - PFT's 08/22/2013  FEV1  1.96 (103%); ratio 83 and no change p B2 and nl FEF 25/75 p am symbicort, dLco 53 corrects to 72%> rec trial off symbicort > no change symptoms x months  Can't really tell whether symbicort helping but certainly can exac the coughing and since on pred anyway for now for sarcoid will ask her again to change to symbicort 80 2bid prn and see whether she really finds she needs it or not  The proper method of use, as well as anticipated side effects, of a metered-dose inhaler are discussed and demonstrated to the patient. Improved effectiveness after extensive coaching during this visit to a level of approximately  75%

## 2014-03-20 NOTE — Progress Notes (Signed)
Subjective:    Patient ID: [name], female    DOB: 1950-09-23 MRN: [id number]    Brief patient profile:  [age] yobf quit smoking in 1980 and dx with sarcoidosis in 1996 but free of evidence of active sarcoid off steroids when last seen in pulmonary clinic in 04/2010 with predominantly upper airway cough    07/11/2013 ov/ [name]  Chief Complaint  Patient presents with  . Pulmonary Consult    Pt last seen here in 2009- referred back per Dr. Frederik PearHopper's request. Pt c/o cough for 2 yrs and SOB on and off for the past 6 months.  Cough is occ prod with very minimal yellow sputum.  She states that she gets out of breath walking minimal distances such as from room to room at home and from parking lot to building today.   always better p prednisone but 2 weeks later  symptoms resume, last time was Feb 2014 and gradual onset progressive symptoms since then of  Cough = sob, better p saba,  Cough seems worse p supper, does not typically wake her up.  Only using rescue once or twice weekly and says symbicort works when she takes it.  Has h/o GERD / Barrett's denies being told to stay on ppi rec Prednisone 10 mg take  4 each am x 2 days,   2 each am x 2 days,  1 each am x 2 days and stop  Symbicort 160 Take 2 puffs first thing in am and then another 2 puffs about 12 hours later.  Work on inhaler technique:   Only use your albuterol (ventolin) as rescue GERD   Please remember to go to the  x-ray department downstairs for your tests - we will call you with the results when they are available. Please schedule a follow up office visit in 6 weeks, call sooner if needed with pfts  add :  Need to sort out ? Of Barrett's and whether she needs lifetime ppi/ gi   08/22/2013 f/u ov/[name] re: ? Asthma/ ? UACS Chief Complaint  Patient presents with  . Follow-up    Cough is slightly better. She is starting to produce more sputum-yellow in color. She is using rescue inahaler once per Day on average.   overall a  little less doe on symbicort vs baseline but still using saba which helps with cough esp in pm's  rec Try off symbicort to see what difference if any it makes with your breathing or cough or need for rescue  and if worse ok to take it 2 puffs every 12 hours Please see patient coordinator before you leave today  to schedule GI appt with Dr [name] [name] (protonix) 40 mg   Take 30-60 min before first meal of the Day and Pepcid 20 mg one bedtime until see Dr [name] GERD diet   Admit date: 03/07/2014  Discharge date: 03/09/2014  Discharge Diagnoses:  Acute on chronic hypoxemic respiratory failure  Baseline stage 4 sarcoid  Chronic cough  GERD  DISCHARGE PLAN BY DIAGNOSIS  Acute on chronic hypoxemic respiratory failure  Baseline stage 4 sarcoid  Chronic cough  Discharge Plan:  - Supplemental home O2.  - Prednisone taper.  - Continue 7 days of doxycycline.  - Tramadol 50mg  q6h.  - Respiratory virus panel results pending.  - F/u appt with Pulmonary as scheduled below.  GERD  Discharge Plan:  - Continue PPI (over the counter).  DISCHARGE SUMMARY  Kiara [name] is a 63 y.o.  y/o AA female with a PMH of Pulmonary Sarcoidosis, Asthma, GERD, Barrett's, Hiatal hernia, Migraines, IBS, Anxiety. She was seen by her PCP [name](Hopper) on 6/30 for cough, fever, tachycardia, SOB x 10 days. In office, SpO2 82%. PCP sent her to ED for further evaluation.  In ED, CT angio was obtained and was negative for PE and demonstrated findings c/w sarcoidosis. She was treated with nebulizers, put on supplemental O2, [facility]  She was started on IV steroids and given empiric abx (doxycycline).  On 7/2, her symptoms were markedly improved and she was deemed safe for discharge home. She is scheduled for a follow up outpatient visit with pulmonary as noted below.  SIGNIFICANT DIAGNOSTIC STUDIES / EVENTS  6/30 admit  6/30 CTA >>> neg for PE , cystic lucencies/fibrosis in a  perilymphatic/peribronchovascular distribution with subpleural reticulation/fibrosis, related to patient's known sarcoidosis.  MICRO DATA  Blood 6/30 >>> ngtd  Urine 6/30 >>> ? contaminant  Autoimmune Panel 6/30 >>> RF high, ANA neg, CCP and HP neg ANTIBIOTICS  Doxycycline 6/30 >>> (will be discharged with 7 days outpatient therapy).     03/20/2014 f/u ov/[name] re:  symbicort 80 2bid/ 02 24/7 at 2lpm / 02 20 mg daily  Chief Complaint  Patient presents with  . HFU    Pt states that her cough is some better since hospital d/c, but still coughing up moderate clear to light green sputum.    not clear whether benefited or not from trial off symbicort for uacs but better on neurontin per voice center until ran out.  Not needing saba at all   No obvious Day to Day or daytime variabilty or assoc  cp or chest tightness, subjective wheeze overt sinus or hb symptoms. No unusual exp hx or h/o childhood pna/ asthma or knowledge of premature birth.  Sleeping ok without nocturnal  or early am exacerbation  of respiratory  c/o's or need for noct saba. Also denies any obvious fluctuation of symptoms with weather or environmental changes or other aggravating or alleviating factors except as outlined above   Current Medications, Allergies, Complete Past Medical History, Past Surgical History, Family History, and Social History were reviewed in [facility] electronic [facility] record.  ROS  The following are not active complaints unless bolded sore throat, dysphagia, dental problems, itching, sneezing,  nasal congestion or excess/ purulent secretions, ear ache,   fever, chills, sweats, unintended wt loss, pleuritic or exertional cp, hemoptysis,  orthopnea pnd or leg swelling, presyncope, palpitations, heartburn, abdominal pain, anorexia, nausea, vomiting, diarrhea  or change in bowel or urinary habits, change in stools or urine, dysuria,hematuria,  rash, arthralgias, visual complaints, headache, numbness weakness  or ataxia or problems with walking or coordination,  change in mood/affect or memory.       .  .            Objective:   Physical Exam  amb bf nad  08/22/2013     204 > 03/20/2014   214  Wt Readings from Last 3 Encounters:  07/11/13 204 lb (92.534 kg)  10/15/12 193 lb (87.544 kg)  09/23/12 191 lb (86.637 kg)     HEENT: nl dentition, turbinates, and orophanx. Nl external ear canals without cough reflex   NECK :  without JVD/Nodes/TM/ nl carotid upstrokes bilaterally   LUNGS: no acc muscle use, clear bilaterally    CV:  RRR  no s3 or murmur or increase in P2, no edema   ABD:  soft and nontender  with nl excursion in the supine position. No bruits or organomegaly, bowel sounds nl  MS:  warm without deformities, calf tenderness, cyanosis or clubbing  SKIN: warm and dry without lesions    NEURO:  alert, approp, no deficits     CTa 03/07/14 No evidence of pulmonary embolism to the segmental level.  Findings of pulmonary sarcoidosis with associated mediastinal and  hilar lymphadenopathy.       Assessment & Plan:

## 2014-03-20 NOTE — Assessment & Plan Note (Signed)
The goal with a chronic steroid dependent illness is always arriving at the lowest effective dose that controls the disease/symptoms and not accepting a set "formula" which is based on statistics or guidelines that don't always take into account patient  variability or the natural hx of the dz in every individual patient, which may well vary over time.  For now therefore I recommend the patient maintain  A floor of 10 mg daily until we sort out how much if any active sarcoid she has

## 2014-03-20 NOTE — Assessment & Plan Note (Addendum)
Classic Upper airway cough syndrome, so named because it's frequently impossible to sort out how much is  CR/sinusitis with freq throat clearing (which can be related to primary GERD)   vs  causing  secondary (" extra esophageal")  GERD from wide swings in gastric pressure that occur with throat clearing, often  promoting self use of mint and menthol lozenges that reduce the lower esophageal sphincter tone and exacerbate the problem further in a cyclical fashion.   These are the same pts (now being labeled as having "irritable larynx syndrome" by some cough centers) who not infrequently have a history of having failed to tolerate ace inhibitors,  dry powder inhalers or biphosphonates or report having atypical reflux symptoms that don't respond to standard doses of PPI , and are easily confused as having aecopd or asthma flares by even experienced allergists/ pulmonologists.   [facility] likely irritable l[facility] syndrome and she was better on 100 tid so try 300 tid if tolerates and in meantime max rx for gerd acid and non acid related. See instructions for specific recommendations which were reviewed directly with the patient who was given a copy with highlighter outlining the key components.   F/u in 2 weeks for full medication reconciliation.   To keep things simple, I have asked the patient to first separate medicines that are perceived as maintenance, that is to be taken daily "no matter what", from those medicines that are taken on only on an as-needed basis and I have given the patient examples of both, and then return to see our NP to generate a  detailed  medication calendar which should be followed until the next physician sees the patient and updates it.

## 2014-03-23 ENCOUNTER — Inpatient Hospital Stay: Payer: BC Managed Care – PPO | Admitting: Adult Health

## 2014-03-28 ENCOUNTER — Ambulatory Visit: Payer: Medicare Other | Admitting: Internal Medicine

## 2014-04-03 ENCOUNTER — Ambulatory Visit (INDEPENDENT_AMBULATORY_CARE_PROVIDER_SITE_OTHER): Payer: BC Managed Care – PPO | Admitting: Adult Health

## 2014-04-03 ENCOUNTER — Encounter: Payer: Self-pay | Admitting: Adult Health

## 2014-04-03 VITALS — BP 104/64 | HR 72 | Temp 97.6°F | Ht 65.0 in | Wt 214.4 lb

## 2014-04-03 DIAGNOSIS — J45909 Unspecified asthma, uncomplicated: Secondary | ICD-10-CM

## 2014-04-03 DIAGNOSIS — R058 Other specified cough: Secondary | ICD-10-CM

## 2014-04-03 DIAGNOSIS — R05 Cough: Secondary | ICD-10-CM

## 2014-04-03 DIAGNOSIS — D869 Sarcoidosis, unspecified: Secondary | ICD-10-CM

## 2014-04-03 DIAGNOSIS — R059 Cough, unspecified: Secondary | ICD-10-CM

## 2014-04-03 NOTE — Assessment & Plan Note (Signed)
No flare of symbicort   Plan  Follow med calendar closely and bring to each visit.  Continue on Prednisone 10 mg daily  Use symbicort 2 puffs every 12 hours as needed for your breathing not coughing Continue Gabapentin 300 mg three times a day  May use Delsym two tsp every 12 hours and supplement if needed with  tramadol 50 mg up to 2 every 4 hours to suppress the urge to cough. Swallowing water or using ice chips/non mint and menthol containing candies (such as lifesavers or sugarless jolly ranchers) are also effective.  You should rest your voice and avoid activities that you know make you cough. Once you have eliminated the cough for 3 straight days try reducing the tramadol first,  then the delsym as tolerated.   Avoid oil based capsules and liquids.  Follow up Dr. Sherene SiresWert  In 6 weeks and As needed

## 2014-04-03 NOTE — Progress Notes (Signed)
Subjective:    Patient ID: [name], female    DOB: [birth year] MRN: [id number]    Brief patient profile:  [age] yobf quit smoking in 1980 and dx with sarcoidosis in 1996 but free of evidence of active sarcoid off steroids when last seen in pulmonary clinic in 04/2010 with predominantly upper airway cough    07/11/2013 ov/ Wert  Chief Complaint  Patient presents with  . Pulmonary Consult    Pt last seen here in 2009- referred back per Dr. Frederik PearHopper's request. Pt c/o cough for 2 yrs and SOB on and off for the past 6 months.  Cough is occ prod with very minimal yellow sputum.  She states that she gets out of breath walking minimal distances such as from room to room at home and from parking lot to building today.   always better p prednisone but 2 weeks later  symptoms resume, last time was Feb 2014 and gradual onset progressive symptoms since then of  Cough = sob, better p saba,  Cough seems worse p supper, does not typically wake her up.  Only using rescue once or twice weekly and says symbicort works when she takes it.  Has h/o GERD / Barrett's denies being told to stay on ppi rec Prednisone 10 mg take  4 each am x 2 days,   2 each am x 2 days,  1 each am x 2 days and stop  Symbicort 160 Take 2 puffs first thing in am and then another 2 puffs about 12 hours later.  Work on inhaler technique:   Only use your albuterol (ventolin) as rescue GERD   Please remember to go to the  x-ray department downstairs for your tests - we will call you with the results when they are available. Please schedule a follow up office visit in 6 weeks, call sooner if needed with pfts  add :  Need to sort out ? Of Barrett's and whether she needs lifetime ppi/ gi   08/22/2013 f/u ov/Wert re: ? Asthma/ ? UACS Chief Complaint  Patient presents with  . Follow-up    Cough is slightly better. She is starting to produce more sputum-yellow in color. She is using rescue inahaler once per day on average.   overall a  little less doe on symbicort vs baseline but still using saba which helps with cough esp in pm's  rec Try off symbicort to see what difference if any it makes with your breathing or cough or need for rescue  and if worse ok to take it 2 puffs every 12 hours Please see patient coordinator before you leave today  to schedule GI appt with Dr Lina Sarora [name]   Take 30-60 min before first meal of the day and Pepcid 20 mg one bedtime until see Dr Juanda ChanceBrodie GERD diet   Admit date: 03/07/2014  Discharge date: 03/09/2014  Discharge Diagnoses:  Acute on chronic hypoxemic respiratory failure  Baseline stage 4 sarcoid  Chronic cough  GERD  DISCHARGE PLAN BY DIAGNOSIS  Acute on chronic hypoxemic respiratory failure  Baseline stage 4 sarcoid  Chronic cough  Discharge Plan:  - Supplemental home O2.  - Prednisone taper.  - Continue 7 days of doxycycline.  - Tramadol 50mg  q6h.  - Respiratory virus panel results pending.  - F/u appt with Pulmonary as scheduled below.  GERD  Discharge Plan:  - Continue PPI (over the counter).  DISCHARGE SUMMARY  [name] is a 63 y.o.  y/o AA female with a PMH of Pulmonary Sarcoidosis, Asthma, GERD, Barrett's, Hiatal hernia, Migraines, IBS, Anxiety. She was seen by her PCP Alwyn Ren) on 6/30 for cough, fever, tachycardia, SOB x 10 days. In office, SpO2 82%. PCP sent her to ED for further evaluation.  In ED, CT angio was obtained and was negative for PE and demonstrated findings c/w sarcoidosis. She was treated with nebulizers, put on supplemental O2, and admitted by [facility].  She was started on IV steroids and given empiric abx (doxycycline).  On 7/2, her symptoms were markedly improved and she was deemed safe for discharge home. She is scheduled for a follow up outpatient visit with pulmonary as noted below.  SIGNIFICANT DIAGNOSTIC STUDIES / EVENTS  6/30 admit  6/30 CTA >>> neg for PE , cystic lucencies/fibrosis in a  perilymphatic/peribronchovascular distribution with subpleural reticulation/fibrosis, related to patient's known sarcoidosis.  MICRO DATA  Blood 6/30 >>> ngtd  Urine 6/30 >>> ? contaminant  Autoimmune Panel 6/30 >>> RF high, ANA neg, CCP and HP neg ANTIBIOTICS  Doxycycline 6/30 >>> (will be discharged with 7 days outpatient therapy).     03/20/2014 f/u ov/Wert re:  symbicort 80 2bid/ 02 24/7 at 2lpm / 02 20 mg daily  Chief Complaint  Patient presents with  . HFU    Pt states that her cough is some better since hospital d/c, but still coughing up moderate clear to light green sputum.    not clear whether benefited or not from trial off symbicort for uacs but better on neurontin per voice center until ran out.  Not needing saba at all  >>decrease pred 10mg  daily , change symbicort to As needed  , increased gabapentin Three times a day    04/03/2014 Follow up  Returns for a followup and medication review. We reviewed all her medications organized them into a medication calendar with patient education. Appears that she is taking her medications correctly. Patient does say that she feels better with decreased shortness, of breath. Now that she is on oxygen. ~She can be more active. Patient's cough is slightly improved. She is using Delsym, tramadol and was increased on her gabapentin to 3 times daily. She is currently on prednisone 10 mg daily. She is no longer using Symbicort. As it was changed to when necessary use only. She says that she has not felt the need to use. This since last visit. MW pt here for new med calendar - pt brought all meds with her today.  reports breathing is improved with the O2 but does have some questions She denies any hemoptysis, orthopnea, PND, increased leg swelling, abdominal pain, nausea, vomiting, diarrhea  Current Medications, Allergies, Complete Past Medical History, Past Surgical History, Family History, and Social History were reviewed in [employer] record.  ROS  The following are not active complaints unless bolded sore throat, dysphagia, dental problems, itching, sneezing,  nasal congestion or excess/ purulent secretions, ear ache,   fever, chills, sweats, unintended wt loss, pleuritic or exertional cp, hemoptysis,  orthopnea pnd or leg swelling, presyncope, palpitations, heartburn, abdominal pain, anorexia, nausea, vomiting, diarrhea  or change in bowel or urinary habits, change in stools or urine, dysuria,hematuria,  rash, arthralgias, visual complaints, headache, numbness weakness or ataxia or problems with walking or coordination,  change in mood/affect or memory.       .  .            Objective:   Physical Exam  amb bf nad  08/22/2013     204 > 03/20/2014   214  >214 04/03/2014   HEENT: nl dentition, turbinates, and orophanx. Nl external ear canals without cough reflex   NECK :  without JVD/Nodes/TM/ nl carotid upstrokes bilaterally   LUNGS: no acc muscle use, clear bilaterally    CV:  RRR  no s3 or murmur or increase in P2, no edema   ABD:  soft and nontender with nl excursion in the supine position. No bruits or organomegaly, bowel sounds nl  MS:  warm without deformities, calf tenderness, cyanosis or clubbing  SKIN: warm and dry without lesions    NEURO:  alert, approp, no deficits     CTa 03/07/14 No evidence of pulmonary embolism to the segmental level.  Findings of pulmonary sarcoidosis with associated mediastinal and  hilar lymphadenopathy.       Assessment & Plan:

## 2014-04-03 NOTE — Patient Instructions (Signed)
Follow med calendar closely and bring to each visit.  Continue on Prednisone 10 mg daily  Use symbicort 2 puffs every 12 hours as needed for your breathing not coughing Continue Gabapentin 300 mg three times a day  May use Delsym two tsp every 12 hours and supplement if needed with  tramadol 50 mg up to 2 every 4 hours to suppress the urge to cough. Swallowing water or using ice chips/non mint and menthol containing candies (such as lifesavers or sugarless jolly ranchers) are also effective.  You should rest your voice and avoid activities that you know make you cough. Once you have eliminated the cough for 3 straight days try reducing the tramadol first,  then the delsym as tolerated.   Avoid oil based capsules and liquids.  Follow up Dr. Sherene SiresWert  In 6 weeks and As needed

## 2014-04-03 NOTE — Assessment & Plan Note (Signed)
Improving on current regimen   Plan  Follow med calendar closely and bring to each visit.  Continue on Prednisone 10 mg daily  Use symbicort 2 puffs every 12 hours as needed for your breathing not coughing Continue Gabapentin 300 mg three times a day  May use Delsym two tsp every 12 hours and supplement if needed with  tramadol 50 mg up to 2 every 4 hours to suppress the urge to cough. Swallowing water or using ice chips/non mint and menthol containing candies (such as lifesavers or sugarless jolly ranchers) are also effective.  You should rest your voice and avoid activities that you know make you cough. Once you have eliminated the cough for 3 straight days try reducing the tramadol first,  then the delsym as tolerated.   Avoid oil based capsules and liquids.  Follow up Dr. Sherene SiresWert  In 6 weeks and As needed

## 2014-04-03 NOTE — Assessment & Plan Note (Signed)
Steroid dependent .[name] Fraction.medcal   Plan  Follow med calendar closely and bring to each visit.  Continue on Prednisone 10 mg daily  Use symbicort 2 puffs every 12 hours as needed for your breathing not coughing Continue Gabapentin 300 mg three times a day  May use Delsym two tsp every 12 hours and supplement if needed with  tramadol 50 mg up to 2 every 4 hours to suppress the urge to cough. Swallowing water or using ice chips/non mint and menthol containing candies (such as lifesavers or sugarless jolly ranchers) are also effective.  You should rest your voice and avoid activities that you know make you cough. Once you have eliminated the cough for 3 straight days try reducing the tramadol first,  then the delsym as tolerated.   Avoid oil based capsules and liquids.  Follow up Dr. Sherene SiresWert  In 6 weeks and As needed

## 2014-04-05 ENCOUNTER — Telehealth: Payer: Self-pay | Admitting: Internal Medicine

## 2014-04-05 MED ORDER — TRAMADOL HCL 50 MG PO TABS: 50.0000 mg | ORAL_TABLET | Freq: Four times a day (QID) | ORAL | Status: DC | PRN

## 2014-04-05 NOTE — Telephone Encounter (Signed)
I called spoke with pt. She reports she ran out of tramadol this AM. Wants to know if she still needs this.  Pt reports she still has prod cough-white/green phlem. Pt still takes gabapentin and delsym cough syrup. Please advise MW thanks

## 2014-04-05 NOTE — Telephone Encounter (Signed)
Spoke with pt, verified that we would refill her tramadol and send it to [facility] on [address].  Also moved pt's appt up until Tuesday at 10:45.  Pt did not want to come in any earlier than that.  Nothing further needed at this time.

## 2014-04-05 NOTE — Addendum Note (Signed)
Addended by: Boone MasterJONES, JESSICA E on: 04/05/2014 12:21 PM   Modules accepted: Orders, Medications

## 2014-04-05 NOTE — Telephone Encounter (Signed)
Ok x one tramadol refill but be sure has ov with me with med calendar before this urns out

## 2014-04-11 ENCOUNTER — Ambulatory Visit: Payer: BC Managed Care – PPO | Admitting: Internal Medicine

## 2014-04-11 ENCOUNTER — Ambulatory Visit (INDEPENDENT_AMBULATORY_CARE_PROVIDER_SITE_OTHER): Payer: BC Managed Care – PPO | Admitting: Internal Medicine

## 2014-04-11 ENCOUNTER — Encounter: Payer: Self-pay | Admitting: Internal Medicine

## 2014-04-11 VITALS — BP 118/64 | HR 107 | Temp 99.3°F | Ht 65.0 in | Wt 220.0 lb

## 2014-04-11 DIAGNOSIS — J9610 Chronic respiratory failure, unspecified whether with hypoxia or hypercapnia: Secondary | ICD-10-CM

## 2014-04-11 DIAGNOSIS — D869 Sarcoidosis, unspecified: Secondary | ICD-10-CM

## 2014-04-11 DIAGNOSIS — J9611 Chronic respiratory failure with hypoxia: Secondary | ICD-10-CM

## 2014-04-11 DIAGNOSIS — R0902 Hypoxemia: Secondary | ICD-10-CM

## 2014-04-11 DIAGNOSIS — J45909 Unspecified asthma, uncomplicated: Secondary | ICD-10-CM

## 2014-04-11 NOTE — Progress Notes (Signed)
Subjective:    Patient ID: Margaretha SeedsBarbara V Amico, female    DOB: 1950-09-23 MRN: [id number]    Brief patient profile:  [age] yobf quit smoking in 1980 and dx with sarcoidosis in 1996 but free of evidence of active sarcoid off steroids when last seen in pulmonary clinic in 04/2010 with predominantly upper airway cough    07/11/2013 ov/ Sophiamarie Nease  Chief Complaint  Patient presents with  . Pulmonary Consult    Pt last seen here in 2009- referred back per Dr. Frederik PearHopper's request. Pt c/o cough for 2 yrs and SOB on and off for the past 6 months.  Cough is occ prod with very minimal yellow sputum.  She states that she gets out of breath walking minimal distances such as from room to room at home and from parking lot to building today.   always better p prednisone but 2 weeks later  symptoms resume, last time was Feb 2014 and gradual onset progressive symptoms since then of  Cough = sob, better p saba,  Cough seems worse p supper, does not typically wake her up.  Only using rescue once or twice weekly and says symbicort works when she takes it.  Has h/o GERD / Barrett's denies being told to stay on ppi rec Prednisone 10 mg take  4 each am x 2 days,   2 each am x 2 days,  1 each am x 2 days and stop  Symbicort 160 Take 2 puffs first thing in am and then another 2 puffs about 12 hours later.  Work on inhaler technique:   Only use your albuterol (ventolin) as rescue GERD   Please remember to go to the  x-ray department downstairs for your tests - we will call you with the results when they are available. Please schedule a follow up office visit in 6 weeks, call sooner if needed with pfts  add :  Need to sort out ? Of Barrett's and whether she needs lifetime ppi/ gi   08/22/2013 f/u ov/Azaylah Stailey re: ? Asthma/ ? UACS Chief Complaint  Patient presents with  . Follow-up    Cough is slightly better. She is starting to produce more sputum-yellow in color. She is using rescue inahaler once per day on average.   overall a  little less doe on symbicort vs baseline but still using saba which helps with cough esp in pm's  rec Try off symbicort to see what difference if any it makes with your breathing or cough or need for rescue  and if worse ok to take it 2 puffs every 12 hours Please see patient coordinator before you leave today  to schedule GI appt with Dr Lina Sarora [name]   Take 30-60 min before first meal of the day and Pepcid 20 mg one bedtime until see Dr Juanda ChanceBrodie GERD diet   Admit date: 03/07/2014  Discharge date: 03/09/2014  Discharge Diagnoses:  Acute on chronic hypoxemic respiratory failure  Baseline stage 4 sarcoid  Chronic cough  GERD  DISCHARGE PLAN BY DIAGNOSIS  Acute on chronic hypoxemic respiratory failure  Baseline stage 4 sarcoid  Chronic cough  Discharge Plan:  - Supplemental home O2.  - Prednisone taper.  - Continue 7 days of doxycycline.  - Tramadol 50mg  q6h.  - Respiratory virus panel results pending.  - F/u appt with Pulmonary as scheduled below.  GERD  Discharge Plan:  - Continue PPI (over the counter).  DISCHARGE SUMMARY  Margaretha SeedsBarbara V Vath is a 63 y.o.  y/o AA female with a PMH of Pulmonary Sarcoidosis, Asthma, GERD, Barrett's, Hiatal hernia, Migraines, IBS, Anxiety. She was seen by her PCP [name] on 6/30 for cough, fever, tachycardia, SOB x 10 days. In office, SpO2 82%. PCP sent her to ED for further evaluation.  In ED, CT angio was obtained and was negative for PE and demonstrated findings c/w sarcoidosis. She was treated with nebulizers, put on supplemental O2, and [facility]  She was started on IV steroids and given empiric abx (doxycycline).  On 7/2, her symptoms were markedly improved and she was deemed safe for discharge home. She is scheduled for a follow up outpatient visit with pulmonary as noted below.  SIGNIFICANT DIAGNOSTIC STUDIES / EVENTS  6/30 admit  6/30 CTA >>> neg for PE , cystic lucencies/fibrosis in a  perilymphatic/peribronchovascular distribution with subpleural reticulation/fibrosis, related to patient's known sarcoidosis.  MICRO DATA  Blood 6/30 >>> ngtd  Urine 6/30 >>> ? contaminant  Autoimmune Panel 6/30 >>> RF high, ANA neg, CCP and HP neg ANTIBIOTICS  Doxycycline 6/30 >>> (will be discharged with 7 days outpatient therapy).     03/20/2014 f/u ov/Tashawn Laswell re:  symbicort 80 2bid/ 02 24/7 at 2lpm / 02 20 mg daily  Chief Complaint  Patient presents with  . HFU    Pt states that her cough is some better since hospital d/c, but still coughing up moderate clear to light green sputum.    not clear whether benefited or not from trial off symbicort for uacs but better on neurontin per voice center until ran out.  Not needing saba at all  >>decrease pred 10mg  daily , change symbicort to As needed  , increased gabapentin Three times a day    04/03/2014 Follow up  Returns for a followup and medication review. We reviewed all her medications organized them into a medication calendar with patient education. Appears that she is taking her medications correctly. Patient does say that she feels better with decreased shortness, of breath. Now that she is on oxygen. ~She can be more active. Patient's cough is slightly improved. She is using Delsym, tramadol and was increased on her gabapentin to 3 times daily. She is currently on prednisone 10 mg daily. She is no longer using Symbicort. As it was changed to when necessary use only. She says that she has not felt the need to use. This since last visit. MW pt here for new med calendar - pt brought all meds with her today.  reports breathing is improved with the O2 but does have some questions rec Follow med calendar closely and bring to each visit.  Continue on Prednisone 10 mg daily  Use symbicort 2 puffs every 12 hours as needed for your breathing not coughing Continue Gabapentin 300 mg three times a day  May use Delsym two tsp every 12 hours and  supplement if needed with  tramadol 50 mg up to 2 every 4 hours to suppress the urge to cough. Swallowing water or using ice chips/non mint and menthol containing candies (such as lifesavers or sugarless jolly ranchers) are also effective.  You should rest your voice and avoid activities that you know make you cough. Once you have eliminated the cough for 3 straight days try reducing the tramadol first,  then the delsym as tolerated.   Avoid oil based capsules and liquids.    04/11/2014 f/u ov/Mayes Sangiovanni re: cough worse in evenings after supper / refuses voice center return (too far) Chief Complaint  Patient  presents with  . Follow-up    Pt states that her breathing is unchanged. She states that her sats with exertion have been dropping into the mid 70's while she is on 2lpm o2.   back on maint symbicort 160 2 bid and neurontin 300 tid with less cough   No obvious day to day or daytime variabilty or assoc  cp or chest tightness, subjective wheeze overt sinus or hb symptoms. No unusual exp hx or h/o childhood pna/ asthma or knowledge of premature birth.  Sleeping ok without nocturnal  or early am exacerbation  of respiratory  c/o's or need for noct saba. Also denies any obvious fluctuation of symptoms with weather or environmental changes or other aggravating or alleviating factors except as outlined above   Current Medications, Allergies, Complete Past Medical History, Past Surgical History, Family History, and Social History were reviewed in [employer] record.  ROS  The following are not active complaints unless bolded sore throat, dysphagia, dental problems, itching, sneezing,  nasal congestion or excess/ purulent secretions, ear ache,   fever, chills, sweats, unintended wt loss, pleuritic or exertional cp, hemoptysis,  orthopnea pnd or leg swelling, presyncope, palpitations, heartburn, abdominal pain, anorexia, nausea, vomiting, diarrhea  or change in bowel or urinary habits,  change in stools or urine, dysuria,hematuria,  rash, arthralgias, visual complaints, headache, numbness weakness or ataxia or problems with walking or coordination,  change in mood/affect or memory.       .  .            Objective:   Physical Exam  amb bf nad with harsh throat clearing and moderate pseudowheeze   08/22/2013     204 > 03/20/2014   214  >214 04/03/2014 > 04/11/2014  220  HEENT: nl dentition, turbinates, and orophanx. Nl external ear canals without cough reflex   NECK :  without JVD/Nodes/TM/ nl carotid upstrokes bilaterally   LUNGS: no acc muscle use, clear bilaterally    CV:  RRR  no s3 or murmur or increase in P2, no edema   ABD:  soft and nontender with nl excursion in the supine position. No bruits or organomegaly, bowel sounds nl  MS:  warm without deformities, calf tenderness, cyanosis or clubbing  SKIN: warm and dry without lesions    NEURO:  alert, approp, no deficits     CTa 03/07/14 No evidence of pulmonary embolism to the segmental level.  Findings of pulmonary sarcoidosis with associated mediastinal and  hilar lymphadenopathy.       Assessment & Plan:

## 2014-04-11 NOTE — Patient Instructions (Addendum)
Increase neurontin to 300 mg four times a day and avoid clearing your throat by using sugarless candy but no cough drops or medicated lozenges/ mint or menthol products or chocolate   For drainage / drippy nose stop clariton and take chlortrimeton (chlorpheniramine) 4 mg every 4 hours available over the counter (may cause drowsiness)   For breathing try symbicort 160 2 puff  5 min before desired activity  See calendar for specific medication instructions and bring it back for each and every office visit for every healthcare provider you see.  Without it,  you may not receive the best quality medical care that we feel you deserve.  You will note that the calendar groups together  your maintenance  medications that are timed at particular times of the day.  Think of this as your checklist for what your doctor has instructed you to do until your next evaluation to see what benefit  there is  to staying on a consistent group of medications intended to keep you well.  The other group at the bottom is entirely up to you to use as you see fit  for specific symptoms that may arise between visits that require you to treat them on an as needed basis.  Think of this as your action plan or "what if" list.   Separating the top medications from the bottom group is fundamental to providing you adequate care going forward.    Please schedule a follow up office visit in 4 weeks, sooner if needed with pfts on return

## 2014-04-12 DIAGNOSIS — J9611 Chronic respiratory failure with hypoxia: Secondary | ICD-10-CM | POA: Insufficient documentation

## 2014-04-12 NOTE — Assessment & Plan Note (Signed)
Adequate control on present rx, reviewed > no change in rx needed   

## 2014-04-12 NOTE — Assessment & Plan Note (Addendum)
Onset: late teens Triggers (environmental, infectious, allergic): foods, odors Rescue inhaler NWG:NFAOZHuse:rarely Maintenance medications/ response: Symbicort twice a day Smoking history:quit 1973 Family history pulmonary disease: adopted  - PFT's 08/22/2013  FEV1  1.96 (103%); ratio 83 and no change p B2 and nl FEF 25/75 p am symbicort, dLco 53 corrects to 72%> rec trial off symbicort > no change symptoms x months then flared 02/2014 and started back on pred/symbicort  - 04/11/2014 p extensive coaching HFA effectiveness =    75%  Continue to feel this problem is predominantly pseudoasthma/ vcd so rather she use symbicort 160 2 q 12 h prn sob and leave the pred at 10 mg day until returns for pfts to sort this out (the symbicort can aggravate vcd, the prednisone not) and increase the neurontin to 300 qid

## 2014-04-12 NOTE — Assessment & Plan Note (Addendum)
-   prednisone daily restarted 03/08/14    The goal with a chronic steroid dependent illness is always arriving at the lowest effective dose that controls the disease/symptoms and not accepting a set "formula" which is based on statistics or guidelines that don't always take into account patient  variability or the natural hx of the dz in every individual patient, which may well vary over time.  For now therefore I recommend the patient maintain  10 mg per day  Not clear this even active sarcoid > pfts on 10 mg per day before taper again

## 2014-04-24 ENCOUNTER — Telehealth: Payer: Self-pay | Admitting: Internal Medicine

## 2014-04-24 ENCOUNTER — Ambulatory Visit: Payer: BC Managed Care – PPO | Admitting: Internal Medicine

## 2014-04-24 NOTE — Telephone Encounter (Signed)
Called spoke with pt. appt scheduled to come in and see MW today. Nothing further needed

## 2014-04-25 ENCOUNTER — Ambulatory Visit: Payer: BC Managed Care – PPO | Admitting: Internal Medicine

## 2014-04-26 ENCOUNTER — Ambulatory Visit (INDEPENDENT_AMBULATORY_CARE_PROVIDER_SITE_OTHER): Payer: BC Managed Care – PPO | Admitting: Internal Medicine

## 2014-04-26 ENCOUNTER — Encounter: Payer: Self-pay | Admitting: Internal Medicine

## 2014-04-26 VITALS — BP 122/84 | HR 115 | Temp 98.5°F | Ht 65.0 in | Wt 226.6 lb

## 2014-04-26 DIAGNOSIS — J45909 Unspecified asthma, uncomplicated: Secondary | ICD-10-CM

## 2014-04-26 DIAGNOSIS — R0902 Hypoxemia: Secondary | ICD-10-CM

## 2014-04-26 DIAGNOSIS — R05 Cough: Secondary | ICD-10-CM

## 2014-04-26 DIAGNOSIS — J9610 Chronic respiratory failure, unspecified whether with hypoxia or hypercapnia: Secondary | ICD-10-CM

## 2014-04-26 DIAGNOSIS — R059 Cough, unspecified: Secondary | ICD-10-CM

## 2014-04-26 DIAGNOSIS — J9611 Chronic respiratory failure with hypoxia: Secondary | ICD-10-CM

## 2014-04-26 DIAGNOSIS — R058 Other specified cough: Secondary | ICD-10-CM

## 2014-04-26 MED ORDER — PANTOPRAZOLE SODIUM 40 MG PO TBEC: 40.0000 mg | DELAYED_RELEASE_TABLET | Freq: Every day | ORAL | Status: DC

## 2014-04-26 MED ORDER — FAMOTIDINE 20 MG PO TABS: ORAL_TABLET | ORAL | Status: DC

## 2014-04-26 NOTE — Progress Notes (Signed)
Subjective:    Patient ID: [name], female    DOB: 1950-09-23 MRN: [id number]    Brief patient profile:  [age] yobf quit smoking in 1980 and dx with sarcoidosis in 1996 but free of evidence of active sarcoid off steroids when last seen in pulmonary clinic in 04/2010 with predominantly upper airway cough    07/11/2013 ov/ [name]  Chief Complaint  Patient presents with  . Pulmonary Consult    Pt last seen here in 2009- referred back per Dr. Frederik PearHopper's request. Pt c/o cough for 2 yrs and SOB on and off for the past 6 months.  Cough is occ prod with very minimal yellow sputum.  She states that she gets out of breath walking minimal distances such as from room to room at home and from parking lot to building today.   always better p prednisone but 2 weeks later  symptoms resume, last time was Feb 2014 and gradual onset progressive symptoms since then of  Cough = sob, better p saba,  Cough seems worse p supper, does not typically wake her up.  Only using rescue once or twice weekly and says symbicort works when she takes it.  Has h/o GERD / Barrett's denies being told to stay on ppi rec Prednisone 10 mg take  4 each am x 2 days,   2 each am x 2 days,  1 each am x 2 days and stop  Symbicort 160 Take 2 puffs first thing in am and then another 2 puffs about 12 hours later.  Work on inhaler technique:   Only use your albuterol (ventolin) as rescue GERD   Please remember to go to the  x-ray department downstairs for your tests - we will call you with the results when they are available. Please schedule a follow up office visit in 6 weeks, call sooner if needed with pfts  add :  Need to sort out ? Of Barrett's and whether she needs lifetime ppi/ gi   08/22/2013 f/u ov/Shelsy Seng re: ? Asthma/ ? UACS Chief Complaint  Patient presents with  . Follow-up    Cough is slightly better. She is starting to produce more sputum-yellow in color. She is using rescue inahaler once per day on average.   overall a  little less doe on symbicort vs baseline but still using saba which helps with cough esp in pm's  rec Try off symbicort to see what difference if any it makes with your breathing or cough or need for rescue  and if worse ok to take it 2 puffs every 12 hours Please see patient coordinator before you leave today  to schedule GI appt with Dr Lina Sarora [name]   Take 30-60 min before first meal of the day and Pepcid 20 mg one bedtime until see Dr Juanda ChanceBrodie GERD diet   Admit date: 03/07/2014  Discharge date: 03/09/2014  Discharge Diagnoses:  Acute on chronic hypoxemic respiratory failure  Baseline stage 4 sarcoid  Chronic cough  GERD  DISCHARGE PLAN BY DIAGNOSIS  Acute on chronic hypoxemic respiratory failure  Baseline stage 4 sarcoid  Chronic cough  Discharge Plan:  - Supplemental home O2.  - Prednisone taper.  - Continue 7 days of doxycycline.  - Tramadol 50mg  q6h.  - Respiratory virus panel results pending.  - F/u appt with Pulmonary as scheduled below.  GERD  Discharge Plan:  - Continue PPI (over the counter).  DISCHARGE SUMMARY  Kiara [name] is a 63 y.o.  y/o AA female with a PMH of Pulmonary Sarcoidosis, Asthma, GERD, Barrett's, Hiatal hernia, Migraines, IBS, Anxiety. She was seen by her PCP [name] on 6/30 for cough, fever, tachycardia, SOB x 10 days. In office, SpO2 82%. PCP sent her to ED for further evaluation.  In ED, CT angio was obtained and was negative for PE and demonstrated findings c/w sarcoidosis. She was treated with nebulizers, put on supplemental O2, and [facility].  She was started on IV steroids and given empiric abx (doxycycline).  On 7/2, her symptoms were markedly improved and she was deemed safe for discharge home. She is scheduled for a follow up outpatient visit with pulmonary as noted below.  SIGNIFICANT DIAGNOSTIC STUDIES / EVENTS  6/30 admit  6/30 CTA >>> neg for PE , cystic lucencies/fibrosis in a  perilymphatic/peribronchovascular distribution with subpleural reticulation/fibrosis, related to patient's known sarcoidosis.  MICRO DATA  Blood 6/30 >>> ngtd  Urine 6/30 >>> ? contaminant  Autoimmune Panel 6/30 >>> RF high, ANA neg, CCP and HP neg ANTIBIOTICS  Doxycycline 6/30 >>> (will be discharged with 7 days outpatient therapy).     03/20/2014 f/u ov/Kennett Symes re:  symbicort 80 2bid/ 02 24/7 at 2lpm / 02 20 mg daily  Chief Complaint  Patient presents with  . HFU    Pt states that her cough is some better since hospital d/c, but still coughing up moderate clear to light green sputum.    not clear whether benefited or not from trial off symbicort for uacs but better on neurontin per voice center until ran out.  Not needing saba at all  >>decrease pred 10mg  daily , change symbicort to As needed  , increased gabapentin Three times a day    04/03/2014 Follow up  Returns for a followup and medication review. We reviewed all her medications organized them into a medication calendar with patient education. Appears that she is taking her medications correctly. Patient does say that she feels better with decreased shortness, of breath. Now that she is on oxygen. ~She can be more active. Patient's cough is slightly improved. She is using Delsym, tramadol and was increased on her gabapentin to 3 times daily. She is currently on prednisone 10 mg daily. She is no longer using Symbicort. As it was changed to when necessary use only. She says that she has not felt the need to use. This since last visit. MW pt here for new med calendar - pt brought all meds with her today.  reports breathing is improved with the O2 but does have some questions rec Follow med calendar closely and bring to each visit.  Continue on Prednisone 10 mg daily  Use symbicort 2 puffs every 12 hours as needed for your breathing not coughing Continue Gabapentin 300 mg three times a day  May use Delsym two tsp every 12 hours and  supplement if needed with  tramadol 50 mg up to 2 every 4 hours to suppress the urge to cough. Swallowing water or using ice chips/non mint and menthol containing candies (such as lifesavers or sugarless jolly ranchers) are also effective.  You should rest your voice and avoid activities that you know make you cough. Once you have eliminated the cough for 3 straight days try reducing the tramadol first,  then the delsym as tolerated.   Avoid oil based capsules and liquids.    04/11/2014 f/u ov/Miyani Cronic re: cough worse in evenings after supper / refuses voice center return (too far) Chief Complaint  Patient  presents with  . Follow-up    Pt states that her breathing is unchanged. She states that her sats with exertion have been dropping into the mid 70's while she is on 2lpm o2.   back on maint symbicort 160 2 bid and neurontin 300 tid with less cough rec Increase neurontin to 300 mg four times a day and avoid clearing your throat by using sugarless candy but no cough drops or medicated lozenges/ mint or menthol products or chocolate  For drainage / drippy nose stop clariton and take chlortrimeton (chlorpheniramine) 4 mg every 4 hours available over the counter (may cause drowsiness)  For breathing try symbicort 160 2 puff  5 min before desired activity   04/26/2014 f/u ov/Epifanio Labrador re: uacs vs asthma worse off gerd rx / using med calendar well  Chief Complaint  Patient presents with  . ACUTE OFFICE VISIT    Pt c/o increased hoarseness x 6 days. Pt states that her throat has not been sore but her mouth/tongue is very sore. Pt states that she can only eat soft foods d/t the pain.   avg symbicort use symbicort is tiw but technique is very poor  Symptoms more day than night Can do whole foods on 02 s difficulty or need for saba  Cough is mostly dry and does not interfere with sleep    No obvious day to day or daytime variabilty or assoc  cp or chest tightness, subjective wheeze overt sinus or hb symptoms.  No unusual exp hx or h/o childhood pna/ asthma or knowledge of premature birth.  Sleeping ok without nocturnal  or early am exacerbation  of respiratory  c/o's or need for noct saba. Also denies any obvious fluctuation of symptoms with weather or environmental changes or other aggravating or alleviating factors except as outlined above   Current Medications, Allergies, Complete Past Medical History, Past Surgical History, Family History, and Social History were reviewed in [employer] record.  ROS  The following are not active complaints unless bolded sore throat, dysphagia, dental problems, itching, sneezing,  nasal congestion or excess/ purulent secretions, ear ache,   fever, chills, sweats, unintended wt loss, pleuritic or exertional cp, hemoptysis,  orthopnea pnd or leg swelling, presyncope, palpitations, heartburn, abdominal pain, anorexia, nausea, vomiting, diarrhea  or change in bowel or urinary habits, change in stools or urine, dysuria,hematuria,  rash, arthralgias, visual complaints, headache, numbness weakness or ataxia or problems with walking or coordination,  change in mood/affect or memory.       .  .            Objective:   Physical Exam  amb bf nad with severely hoarse / moderate pseudowheeze   08/22/2013     204 > 03/20/2014   214  >214 04/03/2014 > 04/11/2014  220 > 227 04/26/2014   HEENT: nl dentition, turbinates, and orophanx. Nl external ear canals without cough reflex   NECK :  without JVD/Nodes/TM/ nl carotid upstrokes bilaterally   LUNGS: no acc muscle use, clear bilaterally    CV:  RRR  no s3 or murmur or increase in P2, no edema   ABD:  soft and nontender with nl excursion in the supine position. No bruits or organomegaly, bowel sounds nl  MS:  warm without deformities, calf tenderness, cyanosis or clubbing  SKIN: warm and dry without lesions        CTa 03/07/14 No evidence of pulmonary embolism to the segmental level.   Findings of pulmonary  sarcoidosis with associated mediastinal and  hilar lymphadenopathy.       Assessment & Plan:

## 2014-04-26 NOTE — Patient Instructions (Signed)
Work on inhaler technique:  relax and gently blow all the way out then take a nice smooth deep breath back in, triggering the inhaler at same time you start breathing in.  Hold for up to 5 seconds if you can.  Rinse and gargle with water when done  Pantoprazole (protonix) 40 mg   Take 30-60 min before first meal of the day and Pepcid 20 mg one bedtime until return to office - this is the best way to tell whether stomach acid is contributing to your problem.    See calendar for specific medication instructions and bring it back for each and every office visit for every healthcare provider you see.  Without it,  you may not receive the best quality medical care that we feel you deserve.  You will note that the calendar groups together  your maintenance  medications that are timed at particular times of the day.  Think of this as your checklist for what your doctor has instructed you to do until your next evaluation to see what benefit  there is  to staying on a consistent group of medications intended to keep you well.  The other group at the bottom is entirely up to you to use as you see fit  for specific symptoms that may arise between visits that require you to treat them on an as needed basis.  Think of this as your action plan or "what if" list.   Separating the top medications from the bottom group is fundamental to providing you adequate care going forward.    Keep previous appointments and no travel until return for pfts

## 2014-04-26 NOTE — Assessment & Plan Note (Signed)
Smoking history:quit 1973 Family history pulmonary disease: adopted  - PFT's 08/22/2013  FEV1  1.96 (103%); ratio 83 and no change p B2 and nl FEF 25/75 p am symbicort, dLco 53 corrects to 72%> rec trial off symbicort > no change symptoms x months then flared 02/2014 and started back on pred daily   - 04/26/2014 p extensive coaching HFA effectiveness =     50% (late trigger)   DDX of  difficult airways management all start with A and  include Adherence, Ace Inhibitors, Acid Reflux, Active Sinus Disease, Alpha 1 Antitripsin deficiency, Anxiety masquerading as Airways dz,  ABPA,  allergy(esp in young), Aspiration (esp in elderly), Adverse effects of DPI,  Active smokers, plus two Bs  = Bronchiectasis and Beta blocker use..and one C= CHF  Adherence is always the initial "prime suspect" and is a multilayered concern that requires a "trust but verify" approach in every patient - starting with knowing how to use medications, especially inhalers, correctly, keeping up with refills and understanding the fundamental difference between maintenance and prns vs those medications only taken for a very short course and then stopped and not refilled.  - using med calendar well - The proper method of use, as well as anticipated side effects, of a metered-dose inhaler are discussed and demonstrated to the patient. Improved effectiveness after extensive coaching during this visit to a level of approximately  50% no better > doubt this is asthma so rather not resume maint symbiocrt until after returns of pfts  ? Acid (or non-acid) GERD > always difficult to exclude as up to 75% of pts in some series report no assoc GI/ Heartburn symptoms> rec max (24h)  acid suppression and diet restrictions/ reviewed and instructions given in writing.   ? Allergy> doubt on pred 10 mg per day but since can't use ics due to uacs leave on this dose until returns fro pfts

## 2014-04-26 NOTE — Assessment & Plan Note (Addendum)
-   rechallenge with neurontin 300 tid > increased to 300 qid 04/11/14  - added back gerd rx 04/26/2014   [facility]     Each maintenance medication was reviewed in detail including most importantly the difference between maintenance and as needed and under what circumstances the prns are to be used. This was done in the context of a medication calendar review which provided the patient with a user-friendly unambiguous mechanism for medication administration and reconciliation and provides an action plan for all active problems. It is critical that this be shown to every doctor  for modification during the office visit if necessary so the patient can use it as a working document.

## 2014-04-26 NOTE — Assessment & Plan Note (Signed)
-   [facility] >   rx 2lpm 24/7   Adequate control on present rx, reviewed > no change in rx needed

## 2014-05-09 ENCOUNTER — Ambulatory Visit: Payer: BC Managed Care – PPO | Admitting: Internal Medicine

## 2014-05-16 ENCOUNTER — Telehealth: Payer: Self-pay | Admitting: Internal Medicine

## 2014-05-16 ENCOUNTER — Ambulatory Visit (INDEPENDENT_AMBULATORY_CARE_PROVIDER_SITE_OTHER): Payer: BC Managed Care – PPO | Admitting: Internal Medicine

## 2014-05-16 ENCOUNTER — Encounter: Payer: Self-pay | Admitting: Internal Medicine

## 2014-05-16 ENCOUNTER — Ambulatory Visit: Payer: BC Managed Care – PPO | Admitting: Internal Medicine

## 2014-05-16 VITALS — BP 90/78 | HR 116 | Temp 98.4°F | Wt 229.0 lb

## 2014-05-16 VITALS — BP 108/62 | HR 117 | Temp 99.3°F | Ht 63.0 in | Wt 227.0 lb

## 2014-05-16 DIAGNOSIS — K2100 Gastro-esophageal reflux disease with esophagitis, without bleeding: Secondary | ICD-10-CM

## 2014-05-16 DIAGNOSIS — J9611 Chronic respiratory failure with hypoxia: Secondary | ICD-10-CM

## 2014-05-16 DIAGNOSIS — J45909 Unspecified asthma, uncomplicated: Secondary | ICD-10-CM

## 2014-05-16 DIAGNOSIS — K2270 Barrett's esophagus without dysplasia: Secondary | ICD-10-CM

## 2014-05-16 DIAGNOSIS — D869 Sarcoidosis, unspecified: Secondary | ICD-10-CM

## 2014-05-16 DIAGNOSIS — R0902 Hypoxemia: Secondary | ICD-10-CM

## 2014-05-16 DIAGNOSIS — J9610 Chronic respiratory failure, unspecified whether with hypoxia or hypercapnia: Secondary | ICD-10-CM

## 2014-05-16 LAB — PULMONARY FUNCTION TEST
DL/VA % pred: 67 %
DL/VA: 3.15 ml/min/mmHg/L
DLCO unc % pred: 42 %
DLCO unc: 9.68 ml/min/mmHg
FEF 25-75 POST: 1.6 L/s
FEF 25-75 Pre: 1.77 L/sec
FEF2575-%Change-Post: -9 %
FEF2575-%Pred-Post: 89 %
FEF2575-%Pred-Pre: 99 %
FEV1-%Change-Post: -2 %
FEV1-%PRED-POST: 96 %
FEV1-%Pred-Pre: 98 %
FEV1-Post: 1.79 L
FEV1-Pre: 1.84 L
FEV1FVC-%Change-Post: 2 %
FEV1FVC-%Pred-Pre: 102 %
FEV6-%CHANGE-POST: -4 %
FEV6-%PRED-PRE: 99 %
FEV6-%Pred-Post: 95 %
FEV6-POST: 2.19 L
FEV6-Pre: 2.29 L
FEV6FVC-%Change-Post: 0 %
FEV6FVC-%Pred-Post: 104 %
FEV6FVC-%Pred-Pre: 103 %
FVC-%Change-Post: -4 %
FVC-%PRED-POST: 91 %
FVC-%PRED-PRE: 95 %
FVC-PRE: 2.29 L
FVC-Post: 2.19 L
PRE FEV1/FVC RATIO: 80 %
PRE FEV6/FVC RATIO: 100 %
Post FEV1/FVC ratio: 82 %
Post FEV6/FVC ratio: 100 %
RV % pred: 61 %
RV: 1.27 L
TLC % pred: 75 %
TLC: 3.69 L

## 2014-05-16 NOTE — Telephone Encounter (Signed)
Called and spoke with pt and she is aware of order placed to get her oxygen increased and pulmonary rehab to be set up.  Pt is aware and nothing further is needed.

## 2014-05-16 NOTE — Progress Notes (Signed)
PFT done today. 

## 2014-05-16 NOTE — Patient Instructions (Addendum)
See calendar for specific medication instructions and bring it back for each and every office visit for every healthcare provider you see.  Without it,  you may not receive the best quality medical care that we feel you deserve.  You will note that the calendar groups together  your maintenance  medications that are timed at particular times of the day.  Think of this as your checklist for what your doctor has instructed you to do until your next evaluation to see what benefit  there is  to staying on a consistent group of medications intended to keep you well.  The other group at the bottom is entirely up to you to use as you see fit  for specific symptoms that may arise between visits that require you to treat them on an as needed basis.  Think of this as your action plan or "what if" list.   Separating the top medications from the bottom group is fundamental to providing you adequate care going forward.    Please see patient coordinator before you leave today  to change 02 to 2lpm at rest and 3lpm with activity and refer to pulmonary rehab   Pulmonary follow up is as needed  Late add since both steroid and 02 dep will continue to follow here > consider wean off steroids in 6 weeks

## 2014-05-16 NOTE — Progress Notes (Signed)
Subjective:    Patient ID: [name], female    DOB: 1950-09-23 MRN: [id number]    Brief patient profile:  [age] yobf quit smoking in 1980 and dx with sarcoidosis in 1996 but free of evidence of active sarcoid off steroids when last seen in pulmonary clinic in 04/2010 with predominantly upper airway cough    07/11/2013 ov/ Marcel Sorter  Chief Complaint  Patient presents with  . Pulmonary Consult    Pt last seen here in 2009- referred back per Dr. Frederik PearHopper's request. Pt c/o cough for 2 yrs and SOB on and off for the past 6 months.  Cough is occ prod with very minimal yellow sputum.  She states that she gets out of breath walking minimal distances such as from room to room at home and from parking lot to building today.   always better p prednisone but 2 weeks later  symptoms resume, last time was Feb 2014 and gradual onset progressive symptoms since then of  Cough = sob, better p saba,  Cough seems worse p supper, does not typically wake her up.  Only using rescue once or twice weekly and says symbicort works when she takes it.  Has h/o GERD / Barrett's denies being told to stay on ppi rec Prednisone 10 mg take  4 each am x 2 days,   2 each am x 2 days,  1 each am x 2 days and stop  Symbicort 160 Take 2 puffs first thing in am and then another 2 puffs about 12 hours later.  Work on inhaler technique:   Only use your albuterol (ventolin) as rescue GERD   Please remember to go to the  x-ray department downstairs for your tests - we will call you with the results when they are available. Please schedule a follow up office visit in 6 weeks, call sooner if needed with pfts  add :  Need to sort out ? Of Barrett's and whether she needs lifetime ppi/ gi   08/22/2013 f/u ov/Zyrell Carmean re: ? Asthma/ ? UACS Chief Complaint  Patient presents with  . Follow-up    Cough is slightly better. She is starting to produce more sputum-yellow in color. She is using rescue inahaler once per day on average.   overall a  little less doe on symbicort vs baseline but still using saba which helps with cough esp in pm's  rec Try off symbicort to see what difference if any it makes with your breathing or cough or need for rescue  and if worse ok to take it 2 puffs every 12 hours Please see patient coordinator before you leave today  to schedule GI appt with Dr Lina Sarora [name]   Take 30-60 min before first meal of the day and Pepcid 20 mg one bedtime until see Dr Juanda ChanceBrodie GERD diet   Admit date: 03/07/2014  Discharge date: 03/09/2014  Discharge Diagnoses:  Acute on chronic hypoxemic respiratory failure  Baseline stage 4 sarcoid  Chronic cough  GERD  DISCHARGE PLAN BY DIAGNOSIS  Acute on chronic hypoxemic respiratory failure  Baseline stage 4 sarcoid  Chronic cough  Discharge Plan:  - Supplemental home O2.  - Prednisone taper.  - Continue 7 days of doxycycline.  - Tramadol 50mg  q6h.  - Respiratory virus panel results pending.  - F/u appt with Pulmonary as scheduled below.  GERD  Discharge Plan:  - Continue PPI (over the counter).  DISCHARGE SUMMARY  Kiara [name] is a 63 y.o.  y/o AA female with a PMH of Pulmonary Sarcoidosis, Asthma, GERD, Barrett's, Hiatal hernia, Migraines, IBS, Anxiety. She was seen by her PCP [name] on 6/30 for cough, fever, tachycardia, SOB x 10 days. In office, SpO2 82%. PCP sent her to ED for further evaluation.  In ED, CT angio was obtained and was negative for PE and demonstrated findings c/w sarcoidosis. She was treated with nebulizers, put on supplemental O2, and [facility].  She was started on IV steroids and given empiric abx (doxycycline).  On 7/2, her symptoms were markedly improved and she was deemed safe for discharge home. She is scheduled for a follow up outpatient visit with pulmonary as noted below.  SIGNIFICANT DIAGNOSTIC STUDIES / EVENTS  6/30 admit  6/30 CTA >>> neg for PE , cystic lucencies/fibrosis in a  perilymphatic/peribronchovascular distribution with subpleural reticulation/fibrosis, related to patient's known sarcoidosis.  MICRO DATA  Blood 6/30 >>> ngtd  Urine 6/30 >>> ? contaminant  Autoimmune Panel 6/30 >>> RF high, ANA neg, CCP and HP neg ANTIBIOTICS  Doxycycline 6/30 >>> (will be discharged with 7 days outpatient therapy).     03/20/2014 f/u ov/Olanna Percifield re:  symbicort 80 2bid/ 02 24/7 at 2lpm / 02 20 mg daily  Chief Complaint  Patient presents with  . HFU    Pt states that her cough is some better since hospital d/c, but still coughing up moderate clear to light green sputum.    not clear whether benefited or not from trial off symbicort for uacs but better on neurontin per voice center until ran out.  Not needing saba at all  >>decrease pred 10mg  daily , change symbicort to As needed  , increased gabapentin Three times a day    04/03/2014 Follow up  Returns for a followup and medication review. We reviewed all her medications organized them into a medication calendar with patient education. Appears that she is taking her medications correctly. Patient does say that she feels better with decreased shortness, of breath. Now that she is on oxygen. ~She can be more active. Patient's cough is slightly improved. She is using Delsym, tramadol and was increased on her gabapentin to 3 times daily. She is currently on prednisone 10 mg daily. She is no longer using Symbicort. As it was changed to when necessary use only. She says that she has not felt the need to use. This since last visit. MW pt here for new med calendar - pt brought all meds with her today.  reports breathing is improved with the O2 but does have some questions rec Follow med calendar closely and bring to each visit.  Continue on Prednisone 10 mg daily  Use symbicort 2 puffs every 12 hours as needed for your breathing not coughing Continue Gabapentin 300 mg three times a day  May use Delsym two tsp every 12 hours and  supplement if needed with  tramadol 50 mg up to 2 every 4 hours to suppress the urge to cough. Swallowing water or using ice chips/non mint and menthol containing candies (such as lifesavers or sugarless jolly ranchers) are also effective.  You should rest your voice and avoid activities that you know make you cough. Once you have eliminated the cough for 3 straight days try reducing the tramadol first,  then the delsym as tolerated.   Avoid oil based capsules and liquids.    04/11/2014 f/u ov/Meryl Ponder re: cough worse in evenings after supper / refuses voice center return (too far) Chief Complaint  Patient  presents with  . Follow-up    Pt states that her breathing is unchanged. She states that her sats with exertion have been dropping into the mid 70's while she is on 2lpm o2.   back on maint symbicort 160 2 bid and neurontin 300 tid with less cough rec Increase neurontin to 300 mg four times a day and avoid clearing your throat by using sugarless candy but no cough drops or medicated lozenges/ mint or menthol products or chocolate  For drainage / drippy nose stop clariton and take chlortrimeton (chlorpheniramine) 4 mg every 4 hours available over the counter (may cause drowsiness)  For breathing try symbicort 160 2 puff  5 min before desired activity   04/26/2014 f/u ov/Larron Armor re: uacs vs asthma worse off gerd rx / using med calendar well  Chief Complaint  Patient presents with  . ACUTE OFFICE VISIT    Pt c/o increased hoarseness x 6 days. Pt states that her throat has not been sore but her mouth/tongue is very sore. Pt states that she can only eat soft foods d/t the pain.   avg symbicort use symbicort is tiw but technique is very poor  Symptoms more day than night Can do whole foods on 02 s difficulty or need for saba  Cough is mostly dry and does not interfere with sleep  rec Work on inhaler technique:    Pantoprazole (protonix) 40 mg daily and pepcid at bedtime.      05/16/2014 f/u ov/Omaria Plunk  re: burned out sarcoid/ 02 dep resp failure 24/7 02  Chief Complaint  Patient presents with  . Followup with PFT    Pt states that hoarseness is slightly better. She c/o minimal swelling in her rt foot.    not really limited from desired acitvities on 02 but notes desats with exertion on 2lpm  No obvious day to day or daytime variabilty or assoc  cp or chest tightness, subjective wheeze overt sinus or hb symptoms. No unusual exp hx or h/o childhood pna/ asthma or knowledge of premature birth.  Sleeping ok without nocturnal  or early am exacerbation  of respiratory  c/o's or need for noct saba. Also denies any obvious fluctuation of symptoms with weather or environmental changes or other aggravating or alleviating factors except as outlined above   Current Medications, Allergies, Complete Past Medical History, Past Surgical History, Family History, and Social History were reviewed in [employer] record.  ROS  The following are not active complaints unless bolded sore throat, dysphagia, dental problems, itching, sneezing,  nasal congestion or excess/ purulent secretions, ear ache,   fever, chills, sweats, unintended wt loss, pleuritic or exertional cp, hemoptysis,  orthopnea pnd or leg swelling, presyncope, palpitations, heartburn, abdominal pain, anorexia, nausea, vomiting, diarrhea  or change in bowel or urinary habits, change in stools or urine, dysuria,hematuria,  rash, arthralgias, visual complaints, headache, numbness weakness or ataxia or problems with walking or coordination,  change in mood/affect or memory.       .  .            Objective:   Physical Exam  amb bf nad with moderately  hoarse / mild pseudowheeze   08/22/2013     204 > 03/20/2014   214  >214 04/03/2014 > 04/11/2014  220 > 227 04/26/2014 > 05/16/2014  227   HEENT: nl dentition, turbinates, and orophanx. Nl external ear canals without cough reflex   NECK :  without JVD/Nodes/TM/ nl carotid  upstrokes bilaterally  LUNGS: no acc muscle use, clear bilaterally    CV:  RRR  no s3 or murmur or increase in P2, no edema   ABD:  soft and nontender with nl excursion in the supine position. No bruits or organomegaly, bowel sounds nl  MS:  warm without deformities, calf tenderness, cyanosis or clubbing  SKIN: warm and dry without lesions        CTa 03/07/14 No evidence of pulmonary embolism to the segmental level.  Findings of pulmonary sarcoidosis with associated mediastinal and  hilar lymphadenopathy.       Assessment & Plan:

## 2014-05-16 NOTE — Progress Notes (Signed)
   Subjective:    Patient ID: [name], female    DOB: Aug 25, 1951, 63 y.o.   MRN: [id number]  HPI   She continues to be monitored by Dr. Sherene Sires for her chronic respiratory insufficiency. She is on nasal oxygen 24 hours a day with increased flow with exercise  She continues to have no symptoms of reflux but has established Barrett's esophagus.  She has an engaged in an anti reflux program using a book written by Dr. Burnard Leigh to control reflux based on the addressing pH of foods. She'll also be discontinuing supplements including garlic.  Other than occasional dysphagia; she has no active GI symptoms.   Review of Systems  Unexplained weight loss, abdominal pain, significant dyspepsia, melena, rectal bleeding, or persistently small caliber stools are denied.      Objective:   Physical Exam   Positive or pertinent physical findings include: She is on nasal O2. Arcus senilis noted. Upper dental plate. Her voice is very harsh and frequently breaks. She exhibits a resting tachycardia with slight flow murmur Dry rales are noted over the lower lung fields diffusely. She has expiratory rhonchi. Abdomen is protuberant but nontender. She has one half-1+ edema Pedal pulses are equal but decreased   General appearance :adequately nourished; in no distress. Eyes: No conjunctival inflammation or scleral icterus is present. Oral exam:  Lips and gums are healthy appearing.There is no oropharyngeal erythema or exudate noted.  Lungs:No increased work of breathing.  Abdomen: bowel sounds normal, soft without masses, organomegaly or hernias noted.  No guarding or rebound. No flank tenderness to percussion. Skin:Warm & dry.  Intact without suspicious lesions or rashes ; no jaundice or tenting Lymphatic: No lymphadenopathy is noted about the head, neck, axilla.           Assessment & Plan:  #1 severe reflux with a laryngeal component  #2 Barrett's esophagus  Plan: See after visit  summary

## 2014-05-16 NOTE — Progress Notes (Signed)
Pre visit review using our clinic review tool, if applicable. No additional management support is needed unless otherwise documented below in the visit note. 

## 2014-05-16 NOTE — Patient Instructions (Signed)
Reflux of gastric acid may be asymptomatic as yours has been & may occur mainly during sleep.The triggers for reflux  include stress; the "aspirin family" ; alcohol; peppermint; and caffeine (coffee, tea, cola, and chocolate). The aspirin family would include aspirin and the nonsteroidal agents such as ibuprofen &  Naproxen. Tylenol would not cause reflux. Food & drink should be avoided for @ least 2 hours before going to bed.

## 2014-05-17 ENCOUNTER — Encounter: Payer: Self-pay | Admitting: Internal Medicine

## 2014-05-17 NOTE — Assessment & Plan Note (Addendum)
-   placed on 02 at [facility] from [facility] 03/08/14 >   rx 2lpm 24/7  - 05/16/2014   Walked 2lpm x one lap @ 185 stopped due to  desat to 88% at end with sob resolved on 3lpm  rx as of  05/16/2014   2lpm except 3lpm with exertion   Referred to rehab

## 2014-05-17 NOTE — Assessment & Plan Note (Signed)
Onset: late teens Triggers (environmental, infectious, allergic): foods, odors Rescue inhaler FTN:[id number] Maintenance medications/ response: Symbicort twice a day Smoking history:quit 1973 Family history pulmonary disease: adopted  - PFT's 08/22/2013  FEV1  1.96 (103%); ratio 83 and no change p B2 and nl FEF 25/75 p am symbicort, dLco 53 corrects to 72%> rec trial off symbicort > no change symptoms x months then flared 02/2014 and started back on pred daily   - 04/26/2014 p extensive coaching HFA effectiveness =     50% (late trigger)  - 05/16/14 PFT's s airflow obst   All goals of chronic asthma control met including optimal function and elimination of symptoms with minimal need for rescue therapy.  Contingencies discussed in full including contacting this office immediately if not controlling the symptoms using the rule of two's.

## 2014-05-17 NOTE — Assessment & Plan Note (Signed)
Pfts 08/22/2013 nl lung vols,  dlco 53 corrects to 73% - PFTs 05/16/2014  Nl lung vols,  DLCO 42% corrects to 67%  - prednisone daily restarted 03/08/14    The goal with a chronic steroid dependent illness is always arriving at the lowest effective dose that controls the disease/symptoms and not accepting a set "formula" which is based on statistics or guidelines that don't always take into account patient  variability or the natural hx of the dz in every individual patient, which may well vary over time.  For now therefore I recommend the patient maintain  A floor of pred 5 mg daily for now but if remains stable symptomatically would taper this off

## 2014-05-18 ENCOUNTER — Telehealth: Payer: Self-pay | Admitting: *Deleted

## 2014-05-18 NOTE — Telephone Encounter (Signed)
ATC NA and no option to leave msg, WCB 

## 2014-05-18 NOTE — Telephone Encounter (Signed)
Message copied by Christen Butter on Thu May 18, 2014  5:12 PM ------      Message from: Sandrea Hughs B      Created: Wed May 17, 2014  7:09 AM       Change f/ to 6 weeks ------

## 2014-05-25 ENCOUNTER — Encounter: Payer: Self-pay | Admitting: Internal Medicine

## 2014-05-25 NOTE — Telephone Encounter (Signed)
Spoke with the pt and scheduled appt with MW for 06/29/14

## 2014-06-05 ENCOUNTER — Telehealth (HOSPITAL_COMMUNITY): Payer: Self-pay

## 2014-06-05 NOTE — Telephone Encounter (Signed)
Called patient in regards to entrance into Pulmonary Rehab.  There was no way to leave message with patient.  Sent letter and will follow up.

## 2014-06-12 ENCOUNTER — Telehealth: Payer: Self-pay | Admitting: Internal Medicine

## 2014-06-12 ENCOUNTER — Telehealth (HOSPITAL_COMMUNITY): Payer: Self-pay

## 2014-06-12 NOTE — Telephone Encounter (Signed)
Called patient regarding entrance to Pulmonary Rehab.  Patient states that they are interested in attending the program.  [name] is going to verify insurance coverage and follow up.    

## 2014-06-12 NOTE — Telephone Encounter (Signed)
Kiara CowdenMichael B Wert, MD at 06/12/2014 5:28 PM     Status: Signed        There is no emphysema, dx is sarcoidosis and vcd which mimick copd/ asthma   Will make pulm rehab aware in AM

## 2014-06-12 NOTE — Telephone Encounter (Signed)
Pt is aware and will need to fill out new form. See prior message and note we left message for pulm rehab. Will close this message

## 2014-06-12 NOTE — Telephone Encounter (Signed)
There is no emphysema, dx is sarcoidosis and vcd which mimick copd/ asthma

## 2014-06-12 NOTE — Telephone Encounter (Signed)
According to referral in EPIC pulm rehab was associated with sarcoidosis.   Called spoke with [name]. She reports pulm rehab told her she had emphysema according to form filled out. [name] was upset bc she was never told this. [name] reports pulm rehab is suppose to fax us form back to fill out correctly. Please advise MW if pulm rehab dx should be sarcoid or emphysema? Thanks

## 2014-06-12 NOTE — Telephone Encounter (Signed)
According to DX in EPIC the order was associated w/ sarcoid. I have already spoken with pt regarding this and [facility] to [facility]. Will await his response. Called pulm rehab and they are now close. WCB

## 2014-06-13 NOTE — Telephone Encounter (Signed)
Called and spoke to [place] ChaseEdna at pulmonary rehab. Informed Marily Memosdna of the associated dx of sarcoidosis and vcd per MW. Marily Memosdna verbalized understanding and noted in pt's chart. Nothing further needed.

## 2014-06-15 ENCOUNTER — Other Ambulatory Visit (INDEPENDENT_AMBULATORY_CARE_PROVIDER_SITE_OTHER): Payer: BC Managed Care – PPO

## 2014-06-15 ENCOUNTER — Encounter: Payer: Self-pay | Admitting: Internal Medicine

## 2014-06-15 ENCOUNTER — Ambulatory Visit (INDEPENDENT_AMBULATORY_CARE_PROVIDER_SITE_OTHER): Payer: BC Managed Care – PPO | Admitting: Internal Medicine

## 2014-06-15 VITALS — BP 114/64 | HR 111 | Temp 98.5°F | Resp 15 | Wt 238.5 lb

## 2014-06-15 DIAGNOSIS — R609 Edema, unspecified: Secondary | ICD-10-CM

## 2014-06-15 DIAGNOSIS — M79661 Pain in right lower leg: Secondary | ICD-10-CM

## 2014-06-15 MED ORDER — FUROSEMIDE 40 MG PO TABS: 40.0000 mg | ORAL_TABLET | Freq: Every day | ORAL | Status: DC

## 2014-06-15 NOTE — Progress Notes (Signed)
Pre visit review using our clinic review tool, if applicable. No additional management support is needed unless otherwise documented below in the visit note. 

## 2014-06-15 NOTE — Progress Notes (Signed)
   Subjective:    Patient ID: [name], female    DOB: [birth year], 63 y.o.   MRN: [id number]  HPI  She describes progressive edema of the ankles and lower extremities over the last 6 weeks. She reviewed the adverse effects in the package insert of her medicines and questions whether edema  is related to the gabapentin which she takes @ 300 mg 4 times a day.  She does ingest salt but " not  Excessive".  She describes feeling "bloated and puffy".  She is not on amlodipine.  She has occasional paroxysmal nocturnal dyspnea. She describes some tenderness to palpation of the lower calves. She also has some discomfort in the calves with ambulation.      Review of Systems    She denies chest pain, palpitations, hemoptysis, fever, chills, or sweats.        Objective:   Physical Exam   Positive or pertinent findings include: She is on nasal oxygen. Arcus present She has scattered low-grade rales and rhonchi. She has a distant S4 gallop with resting tachycardia Abdomen is protuberant but nontender without organomegaly or masses She has 1+ pitting edema of the lower extremities extending above the ankles. Pedal pulses are decreased Equivocal Homans sign on the right; is negative the left  General appearance :adequately nourished; in no distress. Eyes: No conjunctival inflammation or scleral icterus is present. Oral exam: Lips and gums are healthy appearing. Heart:   regular rhythm. No  murmur, click, rub or other extra sounds   Lungs:No increased work of breathing.  Vascular : all pulses equal ; no bruits present. Skin:Warm & dry.  Intact without suspicious lesions or rashes ; no  tenting Lymphatic: No lymphadenopathy is noted about the head, neck, axilla            Assessment & Plan:  #1 edema, progressive  #2 equivocal Homans sign  Plan: See orders and recommendations

## 2014-06-15 NOTE — Patient Instructions (Signed)
   Decrease the gabapentin to 1 every 8 hours for 72 hours and then decrease it to 1 every 12 hours. 72 hours later discontinue it totally. This will help assess whether this is contributing to the edema or swelling of the legs.

## 2014-06-16 ENCOUNTER — Other Ambulatory Visit: Payer: Self-pay | Admitting: Internal Medicine

## 2014-06-16 ENCOUNTER — Telehealth: Payer: Self-pay | Admitting: Internal Medicine

## 2014-06-16 ENCOUNTER — Telehealth: Payer: Self-pay

## 2014-06-16 ENCOUNTER — Ambulatory Visit (HOSPITAL_COMMUNITY): Payer: BC Managed Care – PPO | Attending: Internal Medicine | Admitting: Cardiology

## 2014-06-16 DIAGNOSIS — R7989 Other specified abnormal findings of blood chemistry: Secondary | ICD-10-CM

## 2014-06-16 DIAGNOSIS — M7989 Other specified soft tissue disorders: Secondary | ICD-10-CM

## 2014-06-16 DIAGNOSIS — Z87891 Personal history of nicotine dependence: Secondary | ICD-10-CM | POA: Insufficient documentation

## 2014-06-16 DIAGNOSIS — M79606 Pain in leg, unspecified: Secondary | ICD-10-CM

## 2014-06-16 DIAGNOSIS — R609 Edema, unspecified: Secondary | ICD-10-CM | POA: Diagnosis present

## 2014-06-16 DIAGNOSIS — M79662 Pain in left lower leg: Principal | ICD-10-CM

## 2014-06-16 DIAGNOSIS — M79661 Pain in right lower leg: Secondary | ICD-10-CM

## 2014-06-16 LAB — BASIC METABOLIC PANEL
BUN: 15 mg/dL (ref 6–23)
CO2: 32 mEq/L (ref 19–32)
Calcium: 9.5 mg/dL (ref 8.4–10.5)
Chloride: 103 mEq/L (ref 96–112)
Creatinine, Ser: 1.1 mg/dL (ref 0.4–1.2)
GFR: 64.64 mL/min (ref >60.00)
Glucose, Bld: 88 mg/dL (ref 70–99)
Potassium: 3.9 mEq/L (ref 3.5–5.1)
SODIUM: 138 meq/L (ref 135–145)

## 2014-06-16 LAB — HEPATIC FUNCTION PANEL
ALBUMIN: 3.6 g/dL (ref 3.5–5.2)
ALK PHOS: 69 U/L (ref 39–117)
ALT: 30 U/L (ref 0–35)
AST: 41 U/L — ABNORMAL HIGH (ref 0–37)
Bilirubin, Direct: 0.1 mg/dL (ref 0.0–0.3)
Total Bilirubin: 0.4 mg/dL (ref 0.2–1.2)
Total Protein: 8.1 g/dL (ref 6.0–8.3)

## 2014-06-16 LAB — D-DIMER, QUANTITATIVE (NOT AT ARMC): D DIMER QUANT: 2.15 ug{FEU}/mL — AB (ref 0.00–0.48)

## 2014-06-16 NOTE — Telephone Encounter (Signed)
STAT order was placed for patient to have lower venous doppler. Patient was called and scheduled appt for Monday, 06/19/14. I spoke with patient this afternoon and offered her an appointment to have this done today. Patient refused to go today, stating she is currently on oxygen and it would be too much trouble to get out of the house today. I stressed to the patient that Dr. Alwyn RenHopper felt this was urgent, however patient stated she would wait for her appointment on Monday.

## 2014-06-16 NOTE — Progress Notes (Signed)
Bilateral lower venous duplex performed   Results given to Dr. Alwyn RenHopper.

## 2014-06-16 NOTE — Telephone Encounter (Signed)
Phone call from [place] [facility] states patient's d-dimer is 2.15. Dr Alwyn RenHopper is already aware and states patient is scheduled for dopper today

## 2014-06-19 ENCOUNTER — Encounter (HOSPITAL_COMMUNITY): Payer: BC Managed Care – PPO

## 2014-06-19 ENCOUNTER — Encounter (HOSPITAL_COMMUNITY): Payer: Self-pay

## 2014-06-19 ENCOUNTER — Telehealth: Payer: Self-pay | Admitting: Internal Medicine

## 2014-06-19 ENCOUNTER — Encounter (HOSPITAL_COMMUNITY)
Admission: RE | Admit: 2014-06-19 | Discharge: 2014-06-19 | Disposition: A | Discharge status: Home / Self Care | Payer: BC Managed Care – PPO | Source: Ambulatory Visit | Attending: Internal Medicine | Admitting: Internal Medicine

## 2014-06-19 VITALS — BP 107/59 | HR 120 | Resp 20 | Ht 64.25 in | Wt 228.6 lb

## 2014-06-19 DIAGNOSIS — D869 Sarcoidosis, unspecified: Secondary | ICD-10-CM | POA: Diagnosis not present

## 2014-06-19 DIAGNOSIS — Z5189 Encounter for other specified aftercare: Secondary | ICD-10-CM | POA: Insufficient documentation

## 2014-06-19 NOTE — Telephone Encounter (Signed)
Pt request flu shot,prevnar 13 and pneumonia shot. She forgot to ask when she was in here last week. Pt also stated insurance send her a letter stating that she is due for pneumonia shot, she is checking to see if she is due for one. Please advise.

## 2014-06-19 NOTE — Telephone Encounter (Signed)
Flu first then Prevnar

## 2014-06-19 NOTE — Telephone Encounter (Signed)
Patient advised. She states she is scheduled to come in Thursday for her flu shot

## 2014-06-19 NOTE — Progress Notes (Signed)
Kiara BuzzardBarbara V Day 63 y.o. female Pulmonary Rehab Orientation Note Patient arrived today in Cardiac and [facility] Rehab for orientation to Pulmonary Rehab. [facility]. She does carry portable oxygen. Per pt, she uses oxygen continuously. Color good, skin warm and dry. Patient is oriented to time and place. Patient's medical history and medications reviewed. Heart rate is tachycardic, breath sounds moderate expiratory wheezing heard diffusely throughout both lungs with fine crackles in both bases. Patient states her heart rate is consistently above 100 when she gets SOB. She also states her MD is aware and this is the reason he has recommended pulmonary rehab. Grip strength equal, strong. Distal pulses palpable. Mild non-pitting edema noted in feet, ankles, and legs. Patient reports the swelling is better since starting lasix this past Friday.  Patient reports she does take medications as prescribed. Patient states she follows a specialized low acid diet prescribed by her ENT r/t vocal cord inflammation caused by esophageal acid reflux. The patient reports no specific efforts to gain or lose weight. Patient's weight will be monitored closely. Demonstration and practice of PLB using pulse oximeter. Patient able to return demonstration satisfactorily. Safety and hand hygiene in the exercise area reviewed with patient. Patient voices understanding of the information reviewed. Department expectations discussed with patient and achievable goals were set. The patient shows enthusiasm about attending the program and we look forward to working with this nice lady. The patient is scheduled for a 6 min walk test on Tuesday 10/13 and to begin exercise on Thursday 10/15 at 10:30.   45 minutes was spent on a variety of activities such as assessment of the patient, obtaining baseline data including height, weight, BMI, and grip strength, verifying medical history, allergies, and  current medications, and teaching patient strategies for performing tasks with less respiratory effort with emphasis on pursed lip breathing.

## 2014-06-20 ENCOUNTER — Encounter (HOSPITAL_COMMUNITY)
Admission: RE | Admit: 2014-06-20 | Discharge: 2014-06-20 | Disposition: A | Discharge status: Home / Self Care | Payer: BC Managed Care – PPO | Source: Ambulatory Visit | Attending: Internal Medicine | Admitting: Internal Medicine

## 2014-06-20 DIAGNOSIS — Z5189 Encounter for other specified aftercare: Secondary | ICD-10-CM | POA: Diagnosis not present

## 2014-06-20 NOTE — Progress Notes (Signed)
Britta MccreedyBarbara completed a Six-Minute Walk Test on 06/20/14 . Merna walked 674 feet with 2 breaks.  The patient's lowest oxygen saturation was 84% , highest heart rate was 124 , and highest blood pressure was 124/80. The patient was on 6 liters of oxygen with a nasal cannula. Britta MccreedyBarbara stated that fatigue hindered their walk test.

## 2014-06-22 ENCOUNTER — Telehealth (HOSPITAL_COMMUNITY): Payer: Self-pay | Admitting: Internal Medicine

## 2014-06-22 ENCOUNTER — Ambulatory Visit: Payer: BC Managed Care – PPO

## 2014-06-22 ENCOUNTER — Encounter (HOSPITAL_COMMUNITY): Payer: BC Managed Care – PPO

## 2014-06-23 ENCOUNTER — Other Ambulatory Visit: Payer: Self-pay

## 2014-06-26 ENCOUNTER — Telehealth: Payer: Self-pay | Admitting: Internal Medicine

## 2014-06-26 ENCOUNTER — Telehealth (HOSPITAL_COMMUNITY): Payer: Self-pay

## 2014-06-26 NOTE — Telephone Encounter (Signed)
I called patient to stall coming to Pulmonary Rehab until she meets with Dr. Sherene SiresWert on Thursday (06/29/14).

## 2014-06-26 NOTE — Telephone Encounter (Signed)
Called Molly and pulm rehab is now closed. WCB in AM

## 2014-06-27 ENCOUNTER — Encounter: Payer: Self-pay | Admitting: Internal Medicine

## 2014-06-27 ENCOUNTER — Encounter (HOSPITAL_COMMUNITY): Payer: BC Managed Care – PPO

## 2014-06-27 NOTE — Telephone Encounter (Signed)
[name] with pulm rehab: [name], [place] at 06/20/2014 5:06 PM     Status: Signed        [name] completed a Six-Minute Walk Test on 06/20/14 . [name] walked 674 feet with 2 breaks. The patient's lowest oxygen saturation was 84% , highest heart rate was 124 , and highest blood pressure was 124/80. The patient was on 6 liters of oxygen with a nasal cannula. [name] stated that fatigue hindered their walk test.   ----  Called spoke with [name]. Pt is scheduled to come in and see MW 06/29/14. Pt has POC on pulsed dose. She had to increase pt O2 to 6 liters d/t pt sats falling into low 80's.  FYI for MW

## 2014-06-29 ENCOUNTER — Encounter: Payer: Self-pay | Admitting: Internal Medicine

## 2014-06-29 ENCOUNTER — Other Ambulatory Visit: Payer: Self-pay | Admitting: Internal Medicine

## 2014-06-29 ENCOUNTER — Ambulatory Visit: Payer: BC Managed Care – PPO | Admitting: Internal Medicine

## 2014-06-29 ENCOUNTER — Encounter (HOSPITAL_COMMUNITY): Payer: BC Managed Care – PPO

## 2014-06-29 ENCOUNTER — Ambulatory Visit (INDEPENDENT_AMBULATORY_CARE_PROVIDER_SITE_OTHER): Payer: BC Managed Care – PPO | Admitting: Internal Medicine

## 2014-06-29 VITALS — BP 104/60 | HR 109 | Temp 99.1°F | Ht 68.0 in | Wt 228.0 lb

## 2014-06-29 DIAGNOSIS — D869 Sarcoidosis, unspecified: Secondary | ICD-10-CM

## 2014-06-29 DIAGNOSIS — R058 Other specified cough: Secondary | ICD-10-CM

## 2014-06-29 DIAGNOSIS — J9611 Chronic respiratory failure with hypoxia: Secondary | ICD-10-CM | POA: Diagnosis not present

## 2014-06-29 DIAGNOSIS — Z23 Encounter for immunization: Secondary | ICD-10-CM

## 2014-06-29 DIAGNOSIS — R05 Cough: Secondary | ICD-10-CM | POA: Diagnosis not present

## 2014-06-29 MED ORDER — LORAZEPAM 0.5 MG PO TABS: ORAL_TABLET | ORAL | Status: DC

## 2014-06-29 NOTE — Telephone Encounter (Signed)
OK X1 

## 2014-06-29 NOTE — Progress Notes (Signed)
Subjective:    Patient ID: [name], female    DOB: 1950-09-23 MRN: [id number]    Brief patient profile:  [age] yobf quit smoking in 1980 and dx with sarcoidosis in 1996 but free of evidence of active sarcoid off steroids when last seen in pulmonary clinic in 04/2010 with predominantly upper airway cough    07/11/2013 ov/ [name]  Chief Complaint  Patient presents with  . Pulmonary Consult    Pt last seen here in 2009- referred back per Dr. Frederik PearHopper's request. Pt c/o cough for 2 yrs and SOB on and off for the past 6 months.  Cough is occ prod with very minimal yellow sputum.  She states that she gets out of breath walking minimal distances such as from room to room at home and from parking lot to building today.   always better p prednisone but 2 weeks later  symptoms resume, last time was Feb 2014 and gradual onset progressive symptoms since then of  Cough = sob, better p saba,  Cough seems worse p supper, does not typically wake her up.  Only using rescue once or twice weekly and says symbicort works when she takes it.  Has h/o GERD / Barrett's denies being told to stay on ppi rec Prednisone 10 mg take  4 each am x 2 days,   2 each am x 2 days,  1 each am x 2 days and stop  Symbicort 160 Take 2 puffs first thing in am and then another 2 puffs about 12 hours later.  Work on inhaler technique:   Only use your albuterol (ventolin) as rescue GERD   Please remember to go to the  x-ray department downstairs for your tests - we will call you with the results when they are available. Please schedule a follow up office visit in 6 weeks, call sooner if needed with pfts  add :  Need to sort out ? Of Barrett's and whether she needs lifetime ppi/ gi   08/22/2013 f/u ov/[name] re: ? Asthma/ ? UACS Chief Complaint  Patient presents with  . Follow-up    Cough is slightly better. She is starting to produce more sputum-yellow in color. She is using rescue inahaler once per day on average.   overall a  little less doe on symbicort vs baseline but still using saba which helps with cough esp in pm's  rec Try off symbicort to see what difference if any it makes with your breathing or cough or need for rescue  and if worse ok to take it 2 puffs every 12 hours Please see patient coordinator before you leave today  to schedule GI appt with Dr [name] [name] (protonix) 40 mg   Take 30-60 min before first meal of the day and Pepcid 20 mg one bedtime until see Dr Kiara ChanceBrodie GERD diet   Admit date: 03/07/2014  Discharge date: 03/09/2014  Discharge Diagnoses:  Acute on chronic hypoxemic respiratory failure  Baseline stage 4 sarcoid  Chronic cough  GERD  DISCHARGE PLAN BY DIAGNOSIS  Acute on chronic hypoxemic respiratory failure  Baseline stage 4 sarcoid  Chronic cough  Discharge Plan:  - Supplemental home O2.  - Prednisone taper.  - Continue 7 days of doxycycline.  - Tramadol 50mg  q6h.  - Respiratory virus panel results pending.  - F/u appt with Pulmonary as scheduled below.  GERD  Discharge Plan:  - Continue PPI (over the counter).  DISCHARGE SUMMARY  [name] is a 63 y.o.  y/o AA female with a PMH of Pulmonary Sarcoidosis, Asthma, GERD, Barrett's, Hiatal hernia, Migraines, IBS, Anxiety. She was seen by her PCP [name](Hopper) on 6/30 for cough, fever, tachycardia, SOB x 10 days. In office, SpO2 82%. PCP sent her to ED for further evaluation.  In ED, CT angio was obtained and was negative for PE and demonstrated findings c/w sarcoidosis. She was treated with nebulizers, put on supplemental O2, and admitted by [name].  She was started on IV steroids and given empiric abx (doxycycline).  On 7/2, her symptoms were markedly improved and she was deemed safe for discharge home. She is scheduled for a follow up outpatient visit with pulmonary as noted below.  SIGNIFICANT DIAGNOSTIC STUDIES / EVENTS  6/30 admit  6/30 CTA >>> neg for PE , cystic lucencies/fibrosis in a  perilymphatic/peribronchovascular distribution with subpleural reticulation/fibrosis, related to patient's known sarcoidosis.  MICRO DATA  Blood 6/30 >>> ngtd  Urine 6/30 >>> ? contaminant  Autoimmune Panel 6/30 >>> RF high, ANA neg, CCP and HP neg ANTIBIOTICS  Doxycycline 6/30 >>> (will be discharged with 7 days outpatient therapy).     03/20/2014 f/u ov/[name] re:  symbicort 80 2bid/ 02 24/7 at 2lpm / 02 20 mg daily  Chief Complaint  Patient presents with  . HFU    Pt states that her cough is some better since hospital d/c, but still coughing up moderate clear to light green sputum.    not clear whether benefited or not from trial off symbicort for uacs but better on neurontin per voice center until ran out.  Not needing saba at all  >>decrease pred 10mg  daily , change symbicort to As needed  , increased gabapentin Three times a day    04/03/2014 Follow up  Returns for a followup and medication review. We reviewed all her medications organized them into a medication calendar with patient education. Appears that she is taking her medications correctly. Patient does say that she feels better with decreased shortness, of breath. Now that she is on oxygen. ~She can be more active. Patient's cough is slightly improved. She is using Delsym, tramadol and was increased on her gabapentin to 3 times daily. She is currently on prednisone 10 mg daily. She is no longer using Symbicort. As it was changed to when necessary use only. She says that she has not felt the need to use. This since last visit. MW pt here for new med calendar - pt brought all meds with her today.  reports breathing is improved with the O2 but does have some questions rec Follow med calendar closely and bring to each visit.  Continue on Prednisone 10 mg daily  Use symbicort 2 puffs every 12 hours as needed for your breathing not coughing Continue Gabapentin 300 mg three times a day  May use Delsym two tsp every 12 hours and  supplement if needed with  tramadol 50 mg up to 2 every 4 hours to suppress the urge to cough. Swallowing water or using ice chips/non mint and menthol containing candies (such as lifesavers or sugarless jolly ranchers) are also effective.  You should rest your voice and avoid activities that you know make you cough. Once you have eliminated the cough for 3 straight days try reducing the tramadol first,  then the delsym as tolerated.   Avoid oil based capsules and liquids.    04/11/2014 f/u ov/Moustapha Tooker re: cough worse in evenings after supper / refuses voice center return (too far) Chief Complaint  Patient  presents with  . Follow-up    Pt states that her breathing is unchanged. She states that her sats with exertion have been dropping into the mid 70's while she is on 2lpm o2.   back on maint symbicort 160 2 bid and neurontin 300 tid with less cough rec Increase neurontin to 300 mg four times a day and avoid clearing your throat by using sugarless candy but no cough drops or medicated lozenges/ mint or menthol products or chocolate  For drainage / drippy nose stop clariton and take chlortrimeton (chlorpheniramine) 4 mg every 4 hours available over the counter (may cause drowsiness)  For breathing try symbicort 160 2 puff  5 min before desired activity   04/26/2014 f/u ov/Virdie Penning re: uacs vs asthma worse off gerd rx / using med calendar well  Chief Complaint  Patient presents with  . ACUTE OFFICE VISIT    Pt c/o increased hoarseness x 6 days. Pt states that her throat has not been sore but her mouth/tongue is very sore. Pt states that she can only eat soft foods d/t the pain.   avg symbicort use symbicort is tiw but technique is very poor  Symptoms more day than night Can do whole foods on 02 s difficulty or need for saba  Cough is mostly dry and does not interfere with sleep  rec Work on inhaler technique:    Day (protonix) 40 mg daily and pepcid at bedtime.      05/16/2014 f/u ov/Adisa Litt  re: burned out sarcoid/ 02 dep resp failure 24/7 02  Chief Complaint  Patient presents with  . Followup with PFT    Pt states that hoarseness is slightly better. She c/o minimal swelling in her rt foot.    not really limited from desired acitvities on 02 but notes desats with exertion on 2lpm rec No change rx  Pt stopped prednisone ? Date   06/28/2014 f/u ov/Nickolette Espinola re: 02 dep resp failure/ sarcoid off pred since ? But no worse off  Chief Complaint  Patient presents with  . Follow-up    Breathing is unchaged since the last visit.  No new co's today. She has used rescue inhaler x 2 this past wk.   doing well with rehab though needing more 02 with the heaviest of exertion than the 2lpm we prev recommended.  No more sob on 02 at baseline activity, though.  No obvious day to day or daytime variabilty or assoc cough or  cp or chest tightness, subjective wheeze overt sinus or hb symptoms. No unusual exp hx or h/o childhood pna/ asthma or knowledge of premature birth.  Sleeping ok without nocturnal  or early am exacerbation  of respiratory  c/o's or need for noct saba. Also denies any obvious fluctuation of symptoms with weather or environmental changes or other aggravating or alleviating factors except as outlined above   Current Medications, Allergies, Complete Past Medical History, Past Surgical History, Family History, and Social History were reviewed in [employer] record.  ROS  The following are not active complaints unless bolded sore throat, dysphagia, dental problems, itching, sneezing,  nasal congestion or excess/ purulent secretions, ear ache,   fever, chills, sweats, unintended wt loss, pleuritic or exertional cp, hemoptysis,  orthopnea pnd or leg swelling, presyncope, palpitations, heartburn, abdominal pain, anorexia, nausea, vomiting, diarrhea  or change in bowel or urinary habits, change in stools or urine, dysuria,hematuria,  rash, arthralgias, visual  complaints, headache, numbness weakness or ataxia or problems with walking  or coordination,  change in mood/affect or memory.       .  .            Objective:   Physical Exam  amb bf nad with moderately  hoarse / mild pseudowheeze persistes  08/22/2013     204 > 03/20/2014   214  >214 04/03/2014 > 04/11/2014  220 > 227 04/26/2014 > 05/16/2014  227> 06/29/2014  228   HEENT: nl dentition, turbinates, and orophanx. Nl external ear canals without cough reflex   NECK :  without JVD/Nodes/TM/ nl carotid upstrokes bilaterally   LUNGS: no acc muscle use, clear bilaterally    CV:  RRR  no s3 or murmur or increase in P2, no edema   ABD:  soft and nontender with nl excursion in the supine position. No bruits or organomegaly, bowel sounds nl  MS:  warm without deformities, calf tenderness, cyanosis or clubbing  SKIN: warm and dry without lesions       CTa 03/07/14 No evidence of pulmonary embolism to the segmental level.  Findings of pulmonary sarcoidosis with associated mediastinal and  hilar lymphadenopathy.       Assessment & Plan:

## 2014-06-29 NOTE — Telephone Encounter (Signed)
Pt came by office to request Rx refill for LORAZEPAM tablets. Pt is currently completely out of the medication. Please contact pt when request is reviewed.

## 2014-06-29 NOTE — Patient Instructions (Addendum)
02 is 2lpm at rest and room to room and we will do 02 titration by advanced  Flu and prevnar today   See Tammy NP w/in 2 weeks with all your medications, even over the counter meds, separated in two separate bags, the ones you take no matter what vs the ones you stop once you feel better and take only as needed when you feel you need them.   Tammy  will generate for you a new user friendly medication calendar that will put us all on the same page re: your medication use.     Without this process, it simply isn't possible to assure that we are providing  your outpatient care  with  the attention to detail we feel you deserve.   If we cannot assure that you're getting that kind of care,  then we cannot manage your problem effectively from this clinic.  Once you have seen Tammy and we are sure that we're all on the same page with your medication use she will arrange follow up with me.

## 2014-06-29 NOTE — Telephone Encounter (Signed)
Lorazepam has been called to [facility]

## 2014-07-01 ENCOUNTER — Encounter: Payer: Self-pay | Admitting: Internal Medicine

## 2014-07-01 NOTE — Assessment & Plan Note (Addendum)
Pfts 08/22/2013 nl lung vols,  dlco 53 corrects to 73% - PFTs 05/16/2014  Nl lung vols,  DLCO 42% corrects to 67%  - prednisone daily restarted 03/08/14  > stopped by pt  around 9/15 /15    Pt tending to self titrate prednisone and now off s apparent flare of airway or parenchymal dz which is fine because the goal with a chronic steroid dependent illness is always arriving at the lowest effective dose that controls the disease/symptoms and not accepting a set "formula" which is based on statistics or guidelines that don't always take into account patient  variability or the natural hx of the dz in every individual patient, which may well vary over time.  For now therefore I recommend the patient maintain  Off for now

## 2014-07-01 NOTE — Assessment & Plan Note (Signed)
-   [facility] >   rx 2lpm 24/7  - 05/16/2014   Walked 2lpm x one lap @ 185 stopped due to  desat to 88% at end with sob resolved on 3lpm - 05/17/2014 referred to rehab - 06/20/14  walked 674 feet with 2 breaks. The patient's lowest oxygen saturation was 84% , highest heart rate was 124 , and highest blood pressure was 124/80. The patient was on 6 liters of oxygen with a nasal cannula. Britta MccreedyBarbara stated that fatigue hindered their walk test.    rx as of  06/28/14    2lpm except 3lpm with activity > room to room  and titrate to > 90% with exertion

## 2014-07-01 NOTE — Assessment & Plan Note (Signed)
-   rechallenge with neurontin 300 tid > increased to 300 qid 04/11/14 > pt stopped prior to ov 06/28/14 - added back gerd rx 04/26/2014   She is seeing mulitple doctors and appears to be ad libbing on her maint meds and not keeping up with her med calendar  I had an extended discussion with the patient today lasting 15 to 20 minutes of a 25 minute visit on the following issues:  If she wants to continue for me to participate in her care, she must first agree to full and accurate med reconciliation.  To keep things simple, I have asked the patient to first separate medicines that are perceived as maintenance, that is to be taken daily "no matter what", from those medicines that are taken on only on an as-needed basis and I have given the patient examples of both, and then return to see our NP to generate a  detailed  medication calendar which should be followed until the next physician sees the patient and updates it.

## 2014-07-04 ENCOUNTER — Encounter (HOSPITAL_COMMUNITY)
Admission: RE | Admit: 2014-07-04 | Discharge: 2014-07-04 | Disposition: A | Discharge status: Home / Self Care | Payer: BC Managed Care – PPO | Source: Ambulatory Visit | Attending: Internal Medicine | Admitting: Internal Medicine

## 2014-07-04 DIAGNOSIS — Z5189 Encounter for other specified aftercare: Secondary | ICD-10-CM | POA: Diagnosis not present

## 2014-07-04 NOTE — Progress Notes (Signed)
Today, Brittian[facility] at [facility]. Service time was from 1030 to 1215.  The patient exercised for more than 31 minutes performing aerobic, strengthening, and stretching exercises. Oxygen saturation, heart rate, blood pressure, rate of perceived exertion, and shortness of breath were all monitored before, during, and after exercise. Kiara MccreedyBarbara presented with no problems at today's exercise session.   There was no workload change during today's exercise session.  Pre-exercise vitals:   Weight kg: 100.8   Liters of O2: 4L   SpO2: 97   HR: 110   BP: 100/60   CBG: na  Exercise vitals:   Highest heartrate:  132 down to 122 with rest break   Lowest oxygen saturation: 85 up to 92 with increase in o2 from 4L to 6L and rest break/PLB   Highest blood pressure: 102/66   Liters of 02: 6L  Post-exercise vitals:   SpO2: 6L   HR: 94   BP: 90/40 increased to 118/71 with hydration   Liters of O2: 6L   CBG: na  [name], Medical Director Dr. Butler Denmarkizwan is immediately available during today's Pulmonary Rehab session for Norina BuzzardBarbara V Day on 07/04/2014 at 1030 class time.

## 2014-07-06 ENCOUNTER — Encounter (HOSPITAL_COMMUNITY)
Admission: RE | Admit: 2014-07-06 | Discharge: 2014-07-06 | Disposition: A | Discharge status: Home / Self Care | Payer: BC Managed Care – PPO | Source: Ambulatory Visit | Attending: Internal Medicine | Admitting: Internal Medicine

## 2014-07-06 ENCOUNTER — Telehealth: Payer: Self-pay | Admitting: Internal Medicine

## 2014-07-06 ENCOUNTER — Other Ambulatory Visit (HOSPITAL_COMMUNITY): Payer: Self-pay | Admitting: Obstetrics

## 2014-07-06 DIAGNOSIS — Z1231 Encounter for screening mammogram for malignant neoplasm of breast: Secondary | ICD-10-CM

## 2014-07-06 DIAGNOSIS — Z5189 Encounter for other specified aftercare: Secondary | ICD-10-CM | POA: Diagnosis not present

## 2014-07-06 DIAGNOSIS — J45909 Unspecified asthma, uncomplicated: Secondary | ICD-10-CM

## 2014-07-06 NOTE — Telephone Encounter (Signed)
Yes I meant to ask them to do ambulatory 02 titration and rec best system that will keep her > 90%

## 2014-07-06 NOTE — Progress Notes (Addendum)
Kiara BuzzardBarbara V Getchell 63 y.o. female Nutrition Note Spoke with pt. Pt is obese.  Pt eats 4 meals a day; most prepared at home. Pt c/o wt gain over the past year due to decreased activity level due to pulmonary disease. Pt wt is up 24 lb over the past year according to EMR wt. Pt states she has Laryngo Pharyngeal Reflux (LPR) and follows the Acid Watchers diet. Pt reports losing her voice from LPR and regaining her voice after starting the Acid Watcher's diet. Per discussion of diet and review of pt's Rate Your Plate results, pt is making many healthy food choices. Pt's Rate Your Plate results reviewed with pt. Pt avoids many salty foods; does not use canned/ convenience food.  Pt does not add salt to food.  Pt expressed understanding of the information reviewed. Vitals - 1 value per visit 06/29/2014 06/19/2014 06/15/2014 05/16/2014  Weight (lb) 228 228.62 238.5 229   Vitals - 1 value per visit 05/16/2014 04/26/2014 04/11/2014 04/03/2014 03/20/2014  Weight (lb) 227 226.6 220 214.4 214   Vitals - 1 value per visit 03/09/2014 03/07/2014 03/07/2014 03/07/2014 02/08/2014  Weight (lb) 205.4  209.6  204.6   Vitals - 1 value per visit 10/10/2013 10/07/2013 08/22/2013  Weight (lb)  205.2 204    Nutrition Diagnosis   Obesity related to excessive energy intake as evidenced by a BMI of 38.9    Nutrition Rx/Est. Daily Nutrition Needs for: ? wt loss 1600-2100 Kcal  85-100 gm protein   1500 mg or less sodium      Nutrition Intervention   Pt's individual nutrition plan and goals reviewed with pt.   Benefits of adopting healthy eating habits discussed when pt's Rate Your Plate reviewed.   Pt to attend the Nutrition and Lung Disease class   Continual client-centered nutrition education by RD, as part of interdisciplinary care. Goal(s) 1. Identify food quantities necessary to achieve wt loss of  -2# per week at graduation from pulmonary rehab. Monitor and Evaluate progress toward nutrition goal with team.   Mickle PlumbEdna Aaleyah Witherow, M.Ed, RD,  LDN, CDE 07/06/2014 1:55 PM

## 2014-07-06 NOTE — Progress Notes (Signed)
Today, [name] exercised at [facility]. Service time was from 10:30am to 12:30pm.  The patient exercised for more than 31 minutes performing aerobic, strengthening, and stretching exercises. Oxygen saturation, heart rate, blood pressure, rate of perceived exertion, and shortness of breath were all monitored before, during, and after exercise. [name] presented with no problems at today's exercise session. The patient attended education class with Theda BelfastBob Hamilton on Advanced Directives.  There was no workload change during today's exercise session.  Pre-exercise vitals:   Weight kg: 101.9   Liters of O2: 4   SpO2: 91   HR: 112   BP: 110/78   CBG: na  Exercise vitals:   Highest heartrate:  123   Lowest oxygen saturation: 95   Highest blood pressure: 100/60   Liters of 02: 4  Post-exercise vitals:   SpO2: 98   HR: 106   BP: 114/62   Liters of O2: 4   CBG: na  [name], Medical Director Dr. David StallFeliz-Ortiz is immediately available during today's Pulmonary Rehab session for [name] on 07/06/14 at 10:30am class time.

## 2014-07-06 NOTE — Telephone Encounter (Signed)
Order has been placed and i called and spoke with the pt and she is aware. Nothing further is needed.

## 2014-07-06 NOTE — Telephone Encounter (Signed)
Called spoke with pt. She reports last week MW [facility] out her POC for something else. I do not see an order in EPIC for this. MW is pt needing a different type of POC or portable O2? Please advise thanks

## 2014-07-11 ENCOUNTER — Encounter (HOSPITAL_COMMUNITY)
Admission: RE | Admit: 2014-07-11 | Discharge: 2014-07-11 | Disposition: A | Discharge status: Home / Self Care | Payer: BC Managed Care – PPO | Source: Ambulatory Visit | Attending: Internal Medicine | Admitting: Internal Medicine

## 2014-07-11 DIAGNOSIS — D869 Sarcoidosis, unspecified: Secondary | ICD-10-CM | POA: Diagnosis not present

## 2014-07-11 DIAGNOSIS — Z5189 Encounter for other specified aftercare: Secondary | ICD-10-CM | POA: Diagnosis not present

## 2014-07-11 NOTE — Progress Notes (Signed)
Today, [name] exercised at [facility]. Service time was from 1030 to 1230.  The patient exercised for more than 31 minutes performing aerobic, strengthening, and stretching exercises. Oxygen saturation, heart rate, blood pressure, rate of perceived exertion, and shortness of breath were all monitored before, during, and after exercise. [name] presented with no problems at today's exercise session.   There was a workload change during today's exercise session.  Pre-exercise vitals: . Weight kg: 101.7 . Liters of O2: 4L . SpO2: 97 . HR: 106 . BP: 90/60 . CBG: na  Exercise vitals: . Highest heartrate:  119 . Lowest oxygen saturation: 91 . Highest blood pressure: 100/60 . Liters of 02: 4L  Post-exercise vitals: . SpO2: 97 . HR: 101 . BP: 94/66 . Liters of O2: 4L . CBG: na  [name], Medical Director Dr. David StallFeliz-Ortiz is immediately available during today's Pulmonary Rehab session for [name] on 07/11/2014 at 1030 class time.

## 2014-07-13 ENCOUNTER — Ambulatory Visit (INDEPENDENT_AMBULATORY_CARE_PROVIDER_SITE_OTHER): Payer: BC Managed Care – PPO | Admitting: Adult Health

## 2014-07-13 ENCOUNTER — Encounter: Payer: Self-pay | Admitting: Adult Health

## 2014-07-13 ENCOUNTER — Encounter (HOSPITAL_COMMUNITY)
Admission: RE | Admit: 2014-07-13 | Discharge: 2014-07-13 | Disposition: A | Discharge status: Home / Self Care | Payer: BC Managed Care – PPO | Source: Ambulatory Visit | Attending: Internal Medicine | Admitting: Internal Medicine

## 2014-07-13 VITALS — BP 122/64 | HR 118 | Temp 98.2°F | Ht 65.0 in | Wt 228.8 lb

## 2014-07-13 DIAGNOSIS — Z5189 Encounter for other specified aftercare: Secondary | ICD-10-CM | POA: Diagnosis not present

## 2014-07-13 DIAGNOSIS — J45909 Unspecified asthma, uncomplicated: Secondary | ICD-10-CM

## 2014-07-13 DIAGNOSIS — D869 Sarcoidosis, unspecified: Secondary | ICD-10-CM

## 2014-07-13 DIAGNOSIS — J9611 Chronic respiratory failure with hypoxia: Secondary | ICD-10-CM

## 2014-07-13 NOTE — Assessment & Plan Note (Signed)
Doing well without flare off steroids  Cont w/ pulmonary rehab Patient's medications were reviewed today and patient education was given. Computerized medication calendar was adjusted/completed   Plan  Follow med calendar closely and bring to each visit.  Follow up Dr. Sherene SiresWert  In 2 months and As needed

## 2014-07-13 NOTE — Patient Instructions (Signed)
Follow med calendar closely and bring to each visit.   Follow up Dr. Wert  In 2 months  and As needed         

## 2014-07-13 NOTE — Progress Notes (Signed)
Today, [name] exercised at [facility]. Service time was from 10:30am to 12:30pm.  The patient exercised for more than 31 minutes performing aerobic, strengthening, and stretching exercises. Oxygen saturation, heart rate, blood pressure, rate of perceived exertion, and shortness of breath were all monitored before, during, and after exercise. [name] presented with no problems at today's exercise session. Patient attended education with Gardiner FantiEdna Frank on [employer].  There was no workload change during today's exercise session.  Pre-exercise vitals: . Weight kg: 101.7 . Liters of O2: 4 . SpO2: 98 . HR: 107 . BP: 90/50 . CBG: na  Exercise vitals: . Highest heartrate:  134 . Lowest oxygen saturation: 90 . Highest blood pressure: 106/74 . Liters of 02: 6  Post-exercise vitals: . SpO2: 98 . HR: 99 . BP: 92/60 . Liters of O2: 4 . CBG: na  [name], Medical Director Dr. Butler Denmarkizwan is immediately available during today's Pulmonary Rehab session for Kiara [name] on 07/13/14 at 10:30am class time.

## 2014-07-13 NOTE — Assessment & Plan Note (Signed)
Cont on O2  DME with next portable device

## 2014-07-13 NOTE — Progress Notes (Signed)
Subjective:    Patient ID: Kiara SeedsBarbara V Amico, female    DOB: 1950-09-23 MRN: [id number]    Brief patient profile:  [age] yobf quit smoking in 1980 and dx with sarcoidosis in 1996 but free of evidence of active sarcoid off steroids when last seen in pulmonary clinic in 04/2010 with predominantly upper airway cough    07/11/2013 ov/ Wert  Chief Complaint  Patient presents with  . Pulmonary Consult    Pt last seen here in 2009- referred back per Dr. Frederik PearHopper's request. Pt c/o cough for 2 yrs and SOB on and off for the past 6 months.  Cough is occ prod with very minimal yellow sputum.  She states that she gets out of breath walking minimal distances such as from room to room at home and from parking lot to building today.   always better p prednisone but 2 weeks later  symptoms resume, last time was Feb 2014 and gradual onset progressive symptoms since then of  Cough = sob, better p saba,  Cough seems worse p supper, does not typically wake her up.  Only using rescue once or twice weekly and says symbicort works when she takes it.  Has h/o GERD / Barrett's denies being told to stay on ppi rec Prednisone 10 mg take  4 each am x 2 days,   2 each am x 2 days,  1 each am x 2 days and stop  Symbicort 160 Take 2 puffs first thing in am and then another 2 puffs about 12 hours later.  Work on inhaler technique:   Only use your albuterol (ventolin) as rescue GERD   Please remember to go to the  x-ray department downstairs for your tests - we will call you with the results when they are available. Please schedule a follow up office visit in 6 weeks, call sooner if needed with pfts  add :  Need to sort out ? Of Barrett's and whether she needs lifetime ppi/ gi   08/22/2013 f/u ov/Wert re: ? Asthma/ ? UACS Chief Complaint  Patient presents with  . Follow-up    Cough is slightly better. She is starting to produce more sputum-yellow in color. She is using rescue inahaler once per day on average.   overall a  little less doe on symbicort vs baseline but still using saba which helps with cough esp in pm's  rec Try off symbicort to see what difference if any it makes with your breathing or cough or need for rescue  and if worse ok to take it 2 puffs every 12 hours Please see patient coordinator before you leave today  to schedule GI appt with Dr Lina Sarora [name]   Take 30-60 min before first meal of the day and Pepcid 20 mg one bedtime until see Dr Juanda ChanceBrodie GERD diet   Admit date: 03/07/2014  Discharge date: 03/09/2014  Discharge Diagnoses:  Acute on chronic hypoxemic respiratory failure  Baseline stage 4 sarcoid  Chronic cough  GERD  DISCHARGE PLAN BY DIAGNOSIS  Acute on chronic hypoxemic respiratory failure  Baseline stage 4 sarcoid  Chronic cough  Discharge Plan:  - Supplemental home O2.  - Prednisone taper.  - Continue 7 days of doxycycline.  - Tramadol 50mg  q6h.  - Respiratory virus panel results pending.  - F/u appt with Pulmonary as scheduled below.  GERD  Discharge Plan:  - Continue PPI (over the counter).  DISCHARGE SUMMARY  Kiara [name] is a 63 y.o.  y/o AA female with a PMH of Pulmonary Sarcoidosis, Asthma, GERD, Barrett's, Hiatal hernia, Migraines, IBS, Anxiety. She was seen by her PCP [name] on 6/30 for cough, fever, tachycardia, SOB x 10 days. In office, SpO2 82%. PCP sent her to ED for further evaluation.  In ED, CT angio was obtained and was negative for PE and demonstrated findings c/w sarcoidosis. She was treated with nebulizers, put on supplemental O2, and [facility].  She was started on IV steroids and given empiric abx (doxycycline).  On 7/2, her symptoms were markedly improved and she was deemed safe for discharge home. She is scheduled for a follow up outpatient visit with pulmonary as noted below.  SIGNIFICANT DIAGNOSTIC STUDIES / EVENTS  6/30 admit  6/30 CTA >>> neg for PE , cystic lucencies/fibrosis in a  perilymphatic/peribronchovascular distribution with subpleural reticulation/fibrosis, related to patient's known sarcoidosis.  MICRO DATA  Blood 6/30 >>> ngtd  Urine 6/30 >>> ? contaminant  Autoimmune Panel 6/30 >>> RF high, ANA neg, CCP and HP neg ANTIBIOTICS  Doxycycline 6/30 >>> (will be discharged with 7 days outpatient therapy).     03/20/2014 f/u ov/Wert re:  symbicort 80 2bid/ 02 24/7 at 2lpm / 02 20 mg daily  Chief Complaint  Patient presents with  . HFU    Pt states that her cough is some better since hospital d/c, but still coughing up moderate clear to light green sputum.    not clear whether benefited or not from trial off symbicort for uacs but better on neurontin per voice center until ran out.  Not needing saba at all  >>decrease pred 10mg  daily , change symbicort to As needed  , increased gabapentin Three times a day    04/03/2014 Follow up  Returns for a followup and medication review. We reviewed all her medications organized them into a medication calendar with patient education. Appears that she is taking her medications correctly. Patient does say that she feels better with decreased shortness, of breath. Now that she is on oxygen. ~She can be more active. Patient's cough is slightly improved. She is using Delsym, tramadol and was increased on her gabapentin to 3 times daily. She is currently on prednisone 10 mg daily. She is no longer using Symbicort. As it was changed to when necessary use only. She says that she has not felt the need to use. This since last visit. MW pt here for new med calendar - pt brought all meds with her today.  reports breathing is improved with the O2 but does have some questions rec Follow med calendar closely and bring to each visit.  Continue on Prednisone 10 mg daily  Use symbicort 2 puffs every 12 hours as needed for your breathing not coughing Continue Gabapentin 300 mg three times a day  May use Delsym two tsp every 12 hours and  supplement if needed with  tramadol 50 mg up to 2 every 4 hours to suppress the urge to cough. Swallowing water or using ice chips/non mint and menthol containing candies (such as lifesavers or sugarless jolly ranchers) are also effective.  You should rest your voice and avoid activities that you know make you cough. Once you have eliminated the cough for 3 straight days try reducing the tramadol first,  then the delsym as tolerated.   Avoid oil based capsules and liquids.    04/11/2014 f/u ov/Wert re: cough worse in evenings after supper / refuses voice center return (too far) Chief Complaint  Patient  presents with  . Follow-up    Pt states that her breathing is unchanged. She states that her sats with exertion have been dropping into the mid 70's while she is on 2lpm o2.   back on maint symbicort 160 2 bid and neurontin 300 tid with less cough rec Increase neurontin to 300 mg four times a day and avoid clearing your throat by using sugarless candy but no cough drops or medicated lozenges/ mint or menthol products or chocolate  For drainage / drippy nose stop clariton and take chlortrimeton (chlorpheniramine) 4 mg every 4 hours available over the counter (may cause drowsiness)  For breathing try symbicort 160 2 puff  5 min before desired activity   04/26/2014 f/u ov/Wert re: uacs vs asthma worse off gerd rx / using med calendar well  Chief Complaint  Patient presents with  . ACUTE OFFICE VISIT    Pt c/o increased hoarseness x 6 days. Pt states that her throat has not been sore but her mouth/tongue is very sore. Pt states that she can only eat soft foods d/t the pain.   avg symbicort use symbicort is tiw but technique is very poor  Symptoms more day than night Can do whole foods on 02 s difficulty or need for saba  Cough is mostly dry and does not interfere with sleep  rec Work on inhaler technique:    Pantoprazole (protonix) 40 mg daily and pepcid at bedtime.      05/16/2014 f/u ov/Wert  re: burned out sarcoid/ 02 dep resp failure 24/7 02  Chief Complaint  Patient presents with  . Followup with PFT    Pt states that hoarseness is slightly better. She c/o minimal swelling in her rt foot.    not really limited from desired acitvities on 02 but notes desats with exertion on 2lpm rec No change rx  Pt stopped prednisone ? Date   06/28/2014 f/u ov/Wert re: 02 dep resp failure/ sarcoid off pred since ? But no worse off  Chief Complaint  Patient presents with  . Follow-up    Breathing is unchaged since the last visit.  No new co's today. She has used rescue inhaler x 2 this past wk.   doing well with rehab though needing more 02 with the heaviest of exertion than the 2lpm we prev recommended.  No more sob on 02 at baseline activity, though. >DME order for new portable O2 .   07/13/2014 Follow and Med Review 02 dep resp failure/ sarcoid  Patient returns for a two-week follow-up and medication review. Says overall she is doing okay with her breathing .  Reviewed all her medications organize them into a medication calendar. Patient education and thought appears that she is taking her medications correctly. Says she is doing well with pulmonary rehab, really loves the program.  Is being evaluated by DME for new portable O2 device.  She denies any chest pain, orthopnea, PND, or increased leg swelling   Current Medications, Allergies, Complete Past Medical History, Past Surgical History, Family History, and Social History were reviewed in [facility] electronic medical record.  ROS  The following are not active complaints unless bolded sore throat, dysphagia, dental problems, itching, sneezing,  nasal congestion or excess/ purulent secretions, ear ache,   fever, chills, sweats, unintended wt loss, pleuritic or exertional cp, hemoptysis,  orthopnea pnd or leg swelling, presyncope, palpitations, heartburn, abdominal pain, anorexia, nausea, vomiting, diarrhea  or change in bowel or  urinary habits, change in stools or  urine, dysuria,hematuria,  rash, arthralgias, visual complaints, headache, numbness weakness or ataxia or problems with walking or coordination,  change in mood/affect or memory.       .  .            Objective:   Physical Exam  amb bf nad   08/22/2013     204 > 03/20/2014   214  >214 04/03/2014 > 04/11/2014  220 > 227 04/26/2014 > 05/16/2014  227> 06/28/2014  228 >228 07/13/2014   HEENT: nl dentition, turbinates, and orophanx. Nl external ear canals without cough reflex   NECK :  without JVD/Nodes/TM/ nl carotid upstrokes bilaterally   LUNGS: no acc muscle use, clear bilaterally    CV:  RRR  no s3 or murmur or increase in P2, no edema   ABD:  soft and nontender with nl excursion in the supine position. No bruits or organomegaly, bowel sounds nl  MS:  warm without deformities, calf tenderness, cyanosis or clubbing  SKIN: warm and dry without lesions       CTa 03/07/14 No evidence of pulmonary embolism to the segmental level.  Findings of pulmonary sarcoidosis with associated mediastinal and  hilar lymphadenopathy.       Assessment & Plan:

## 2014-07-18 ENCOUNTER — Telehealth (HOSPITAL_COMMUNITY): Payer: Self-pay | Admitting: Internal Medicine

## 2014-07-18 ENCOUNTER — Encounter (HOSPITAL_COMMUNITY): Payer: BC Managed Care – PPO

## 2014-07-20 ENCOUNTER — Encounter (HOSPITAL_COMMUNITY): Payer: BC Managed Care – PPO

## 2014-07-24 ENCOUNTER — Other Ambulatory Visit: Payer: Self-pay | Admitting: Internal Medicine

## 2014-07-24 ENCOUNTER — Ambulatory Visit (HOSPITAL_COMMUNITY): Payer: BC Managed Care – PPO

## 2014-07-24 ENCOUNTER — Telehealth: Payer: Self-pay | Admitting: Internal Medicine

## 2014-07-24 DIAGNOSIS — D869 Sarcoidosis, unspecified: Secondary | ICD-10-CM

## 2014-07-24 NOTE — Telephone Encounter (Signed)
Called spoke with patient, advised MW Dorette Grateokayed to the change to another DME company Pt is aware she will be contacted regarding the new company Nothing further needed at this time; will sign off

## 2014-07-24 NOTE — Telephone Encounter (Signed)
Called and spoke to pt. Pt stated she had a negative experience with the customer service with [facility] and requesting to switch DME companies. Pt last seen on 06/29/14 by MW.   MW please advise if ok to switch DME companies.

## 2014-07-24 NOTE — Telephone Encounter (Signed)
Fine with me, anyone will do

## 2014-07-25 ENCOUNTER — Encounter (HOSPITAL_COMMUNITY)
Admission: RE | Admit: 2014-07-25 | Discharge: 2014-07-25 | Disposition: A | Discharge status: Home / Self Care | Payer: BC Managed Care – PPO | Source: Ambulatory Visit | Attending: Internal Medicine | Admitting: Internal Medicine

## 2014-07-25 DIAGNOSIS — Z5189 Encounter for other specified aftercare: Secondary | ICD-10-CM | POA: Diagnosis not present

## 2014-07-25 NOTE — Progress Notes (Signed)
Today, [name] exercised at [facility]. Service time was from 10:30am to 12:00pm.  The patient exercised for more than 31 minutes performing aerobic, strengthening, and stretching exercises. Oxygen saturation, heart rate, blood pressure, rate of perceived exertion, and shortness of breath were all monitored before, during, and after exercise. [name] presented with no problems at today's exercise session.   There was an increase in workload change during today's exercise session.  Pre-exercise vitals: . Weight kg: 100.4 . Liters of O2: 3 . SpO2: 97 . HR: 114 . BP: 92/70 . CBG: na  Exercise vitals: . Highest heartrate:  127 . Lowest oxygen saturation: 92 . Highest blood pressure: 100/60 . Liters of 02: 4  Post-exercise vitals: . SpO2: 99 . HR: 109 . BP: 98/56 . Liters of O2: 4 . CBG: na  Dr. Kalman ShanMurali Day, Medical Director Dr. [name] is immediately available during today's Pulmonary Rehab session for [name] on 07/25/14 at 10:30am class time.

## 2014-07-27 ENCOUNTER — Encounter (HOSPITAL_COMMUNITY)
Admission: RE | Admit: 2014-07-27 | Discharge: 2014-07-27 | Disposition: A | Discharge status: Home / Self Care | Payer: BC Managed Care – PPO | Source: Ambulatory Visit | Attending: Internal Medicine | Admitting: Internal Medicine

## 2014-07-27 DIAGNOSIS — Z5189 Encounter for other specified aftercare: Secondary | ICD-10-CM | POA: Diagnosis not present

## 2014-07-27 NOTE — Progress Notes (Signed)
Today, [name] exercised at [facility]. Service time was from 10:30am to 12:30pm.  The patient exercised for more than 31 minutes performing aerobic, strengthening, and stretching exercises. Oxygen saturation, heart rate, blood pressure, rate of perceived exertion, and shortness of breath were all monitored before, during, and after exercise. [name] presented with no problems at today's exercise session. The patient attended education class today with Lindaann SloughMolly Brady on "Exercise for the Pulmonary Patient".  There was no workload change during today's exercise session.  Pre-exercise vitals: . Weight kg: 100.5 . Liters of O2: 4 . SpO2: 96 . HR: 100 . BP: 96/54 . CBG: na  Exercise vitals: . Highest heartrate:  118 . Lowest oxygen saturation: 90 . Highest blood pressure: 120/60 . Liters of 02: 4  Post-exercise vitals: . SpO2: 99 . HR: 108 . BP: 100/78 . Liters of O2: 4 . CBG: na  [name], Medical Director Dr. Butler Denmarkizwan is immediately available during today's Pulmonary Rehab session for Kiara [name] on 07/27/14 at 10:30am class time.

## 2014-07-28 ENCOUNTER — Ambulatory Visit (HOSPITAL_COMMUNITY): Payer: BC Managed Care – PPO

## 2014-07-28 NOTE — Addendum Note (Signed)
Addended by: Boone MasterJONES, Kaston Faughn E on: 07/28/2014 12:53 PM   Modules accepted: Orders, Medications

## 2014-07-31 ENCOUNTER — Encounter: Payer: Self-pay | Admitting: *Deleted

## 2014-07-31 ENCOUNTER — Telehealth: Payer: Self-pay | Admitting: Internal Medicine

## 2014-07-31 NOTE — Telephone Encounter (Signed)
Yes fine

## 2014-07-31 NOTE — Telephone Encounter (Signed)
Letter written and signed by Dr wert and left at front for pt pick up.

## 2014-07-31 NOTE — Telephone Encounter (Signed)
Spoke with pt, she is driving to [place] for christmas through new years but is flying back, needs a letter stating she can use her 02 concentrator on the flight back.  MW ok to write this letter?  Thanks!

## 2014-08-01 ENCOUNTER — Encounter (HOSPITAL_COMMUNITY)
Admission: RE | Admit: 2014-08-01 | Discharge: 2014-08-01 | Disposition: A | Discharge status: Home / Self Care | Payer: BC Managed Care – PPO | Source: Ambulatory Visit | Attending: Internal Medicine | Admitting: Internal Medicine

## 2014-08-01 DIAGNOSIS — Z5189 Encounter for other specified aftercare: Secondary | ICD-10-CM | POA: Diagnosis not present

## 2014-08-01 NOTE — Progress Notes (Signed)
Today, [name] exercised at [facility]. Service time was from 1030 to 1210.  The patient exercised for more than 31 minutes performing aerobic, strengthening, and stretching exercises. Oxygen saturation, heart rate, blood pressure, rate of perceived exertion, and shortness of breath were all monitored before, during, and after exercise. [name] presented with no problems at today's exercise session.   There was no workload change during today's exercise session.  Pre-exercise vitals: . Weight kg: 100.5 . Liters of O2: 4L . SpO2: 96 . HR: 17 . BP: 102/60 . CBG: na  Exercise vitals: . Highest heartrate:  125 . Lowest oxygen saturation: 89 increased to 96 with rest and PLB . Highest blood pressure: 109/74 . Liters of 02: 4L  Post-exercise vitals: . SpO2: 98 . HR: 111 . BP: 99/67 . Liters of O2: 4L . CBG: na  [name], Medical Director [name] is immediately available during today's Pulmonary Rehab session for [name] on 08/01/2014 at 1030 class time.

## 2014-08-03 ENCOUNTER — Encounter (HOSPITAL_COMMUNITY): Payer: BC Managed Care – PPO

## 2014-08-08 ENCOUNTER — Encounter (HOSPITAL_COMMUNITY): Payer: BC Managed Care – PPO

## 2014-08-09 ENCOUNTER — Ambulatory Visit (HOSPITAL_COMMUNITY): Payer: BC Managed Care – PPO

## 2014-08-09 ENCOUNTER — Other Ambulatory Visit (HOSPITAL_COMMUNITY): Payer: Self-pay | Admitting: Obstetrics

## 2014-08-09 ENCOUNTER — Ambulatory Visit (HOSPITAL_COMMUNITY)
Admission: RE | Admit: 2014-08-09 | Discharge: 2014-08-09 | Disposition: A | Discharge status: Home / Self Care | Payer: BC Managed Care – PPO | Source: Ambulatory Visit | Attending: Obstetrics | Admitting: Obstetrics

## 2014-08-09 DIAGNOSIS — Z1231 Encounter for screening mammogram for malignant neoplasm of breast: Secondary | ICD-10-CM

## 2014-08-10 ENCOUNTER — Encounter (HOSPITAL_COMMUNITY)
Admission: RE | Admit: 2014-08-10 | Discharge: 2014-08-10 | Disposition: A | Discharge status: Home / Self Care | Payer: BC Managed Care – PPO | Source: Ambulatory Visit | Attending: Internal Medicine | Admitting: Internal Medicine

## 2014-08-10 DIAGNOSIS — D869 Sarcoidosis, unspecified: Secondary | ICD-10-CM | POA: Insufficient documentation

## 2014-08-10 DIAGNOSIS — Z5189 Encounter for other specified aftercare: Secondary | ICD-10-CM | POA: Insufficient documentation

## 2014-08-10 NOTE — Progress Notes (Signed)
Today, [name] exercised at [facility]. Service time was from 10:30am to 12:25pm.  The patient exercised for more than 31 minutes performing aerobic, strengthening, and stretching exercises. Oxygen saturation, heart rate, blood pressure, rate of perceived exertion, and shortness of breath were all monitored before, during, and after exercise. [name] presented with no problems at today's exercise session. The patient attended education today with [facility] on Pulmonary Medications.  There was no workload change during today's exercise session.  Pre-exercise vitals: . Weight kg: 99.6 . Liters of O2: 4 . SpO2: 99 . HR: 100 . BP: 120/70 . CBG: na  Exercise vitals: . Highest heartrate:  112 . Lowest oxygen saturation: 94 . Highest blood pressure: 108/76 . Liters of 02: 3  Post-exercise vitals: . SpO2: 98 . HR: 104 . BP: 118/80 . Liters of O2: 3 . CBG: na  [name], Medical Director [name] is immediately available during today's Pulmonary Rehab session for [name] on 08/10/14 at 10:30am class time.

## 2014-08-10 NOTE — Progress Notes (Signed)
I have reviewed a Home Exercise Prescription with Margaretha SeedsBarbara V Murcia . Britta MccreedyBarbara is not currently exercising at home.  The patient was advised to walk 2-3 days a week for 25 minutes.  Britta MccreedyBarbara and I discussed how to progress their exercise prescription.  The patient stated that their goals were to lose weight.  The patient stated that they understand the exercise prescription.  We reviewed exercise guidelines, target heart rate during exercise, oxygen use, weather, home pulse oximeter, endpoints for exercise, and goals.  Patient is encouraged to come to me with any questions. I will continue to follow up with the patient to assist them with progression and safety.

## 2014-08-15 ENCOUNTER — Encounter (HOSPITAL_COMMUNITY)
Admission: RE | Admit: 2014-08-15 | Discharge: 2014-08-15 | Disposition: A | Discharge status: Home / Self Care | Payer: BC Managed Care – PPO | Source: Ambulatory Visit | Attending: Internal Medicine | Admitting: Internal Medicine

## 2014-08-15 DIAGNOSIS — Z5189 Encounter for other specified aftercare: Secondary | ICD-10-CM | POA: Diagnosis not present

## 2014-08-15 NOTE — Progress Notes (Signed)
Today, [name] exercised at [facility]. Service time was from 10:30am to 12:00pm.  The patient exercised for more than 31 minutes performing aerobic, strengthening, and stretching exercises. Oxygen saturation, heart rate, blood pressure, rate of perceived exertion, and shortness of breath were all monitored before, during, and after exercise. [name] presented with no problems at today's exercise session.   There was no workload change during today's exercise session.  Pre-exercise vitals: . Weight kg: 98.7 . Liters of O2: 3 . SpO2: 96 . HR: 111 . BP: 84/50 recheck: 110/74 . CBG: na  Exercise vitals: . Highest heartrate:  124 . Lowest oxygen saturation: 90 . Highest blood pressure: 101/74 . Liters of 02: 3  Post-exercise vitals: . SpO2: 97 . HR: 107 . BP: 104/68 . Liters of O2: 3 . CBG: na  [name], Medical Director [name] is immediately available during today's Pulmonary Rehab session for [name] on 08/15/14 at 10:30am class time.

## 2014-08-17 ENCOUNTER — Encounter (HOSPITAL_COMMUNITY)
Admission: RE | Admit: 2014-08-17 | Discharge: 2014-08-17 | Disposition: A | Discharge status: Home / Self Care | Payer: BC Managed Care – PPO | Source: Ambulatory Visit | Attending: Internal Medicine | Admitting: Internal Medicine

## 2014-08-17 ENCOUNTER — Encounter (HOSPITAL_COMMUNITY): Payer: BC Managed Care – PPO

## 2014-08-17 DIAGNOSIS — Z5189 Encounter for other specified aftercare: Secondary | ICD-10-CM | POA: Diagnosis not present

## 2014-08-17 NOTE — Progress Notes (Signed)
Today, [name] exercised at [facility]. Service time was from 1130 to 1220.  The patient exercised for more than 31 minutes performing aerobic, strengthening, and stretching exercises. Oxygen saturation, heart rate, blood pressure, rate of perceived exertion, and shortness of breath were all monitored before, during, and after exercise. [name] presented with no problems at today's exercise session. [name] arrived late to her exercise session because of car trouble she stated.  There was no workload change during today's exercise session.  Pre-exercise vitals: . Weight kg: 98.3 . Liters of O2: 4L . SpO2: 100 . HR: 120 down to 115 with sitting . BP: 118/74 . CBG: na  Exercise vitals: . Highest heartrate:  130 . Lowest oxygen saturation: 92 . Highest blood pressure: 142/70 . Liters of 02: 4L  Post-exercise vitals: . SpO2: 97 . HR: 107 . BP: 124/64 . Liters of O2: 4L . CBG: na  Dr. Kalman ShanMurali Day, Medical Director Dr. Vanessa BarbaraZamora is immediately available during today's Pulmonary Rehab session for [name] on 08/17/2014 at 1030 class time.

## 2014-08-22 ENCOUNTER — Encounter (HOSPITAL_COMMUNITY)
Admission: RE | Admit: 2014-08-22 | Discharge: 2014-08-22 | Disposition: A | Discharge status: Home / Self Care | Payer: BC Managed Care – PPO | Source: Ambulatory Visit | Attending: Internal Medicine | Admitting: Internal Medicine

## 2014-08-22 DIAGNOSIS — Z5189 Encounter for other specified aftercare: Secondary | ICD-10-CM | POA: Diagnosis not present

## 2014-08-22 NOTE — Progress Notes (Signed)
Today, Yuleidy exercised at [facility]. Service time was from 1030 to 1215.  The patient exercised for more than 31 minutes performing aerobic, strengthening, and stretching exercises. Oxygen saturation, heart rate, blood pressure, rate of perceived exertion, and shortness of breath were all monitored before, during, and after exercise. Britta MccreedyBarbara presented with no problems at today's exercise session.   There was no workload change during today's exercise session.  Pre-exercise vitals: . Weight kg: 98.1 . Liters of O2: 4L . SpO2: 97 . HR: 110 . BP: 112/64 . CBG: na  Exercise vitals: . Highest heartrate:  130 down to 116 with rest break . Lowest oxygen saturation: 84 increased to 93 with rest break . Highest blood pressure: 100/60 . Liters of 02: 4L  Post-exercise vitals: . SpO2: 99 . HR: 106 . BP: 96/50 . Liters of O2: 4L . CBG: na  [name], Medical Director Dr. Vanessa BarbaraZamora is immediately available during today's Pulmonary Rehab session for Margaretha SeedsBarbara V Liberati on 08/22/2014 at 1330 class time.

## 2014-08-24 ENCOUNTER — Encounter (HOSPITAL_COMMUNITY)
Admission: RE | Admit: 2014-08-24 | Discharge: 2014-08-24 | Disposition: A | Discharge status: Home / Self Care | Payer: BC Managed Care – PPO | Source: Ambulatory Visit | Attending: Internal Medicine | Admitting: Internal Medicine

## 2014-08-24 DIAGNOSIS — Z5189 Encounter for other specified aftercare: Secondary | ICD-10-CM | POA: Diagnosis not present

## 2014-08-24 NOTE — Progress Notes (Signed)
Today, [name] exercised at [facility]. Service time was from 10:30 to 12:10.  The patient exercised for more than 31 minutes performing aerobic, strengthening, and stretching exercises. Oxygen saturation, heart rate, blood pressure, rate of perceived exertion, and shortness of breath were all monitored before, during, and after exercise. [name] presented with no problems at today's exercise session. Patient attended education class on Pursed Lip and Diaphragmatic breathing with [name].  There was no workload change during today's exercise session.  Pre-exercise vitals: . Weight kg: 99.3 . Liters of O2: 4 . SpO2: 96 . HR: 104 . BP: 92/50 . CBG: na  Exercise vitals: . Highest heartrate:  121 . Lowest oxygen saturation: 90 . Highest blood pressure: 100/60 . Liters of 02: 4  Post-exercise vitals: . SpO2: 98 . HR: 101 . BP: 90/60 . Liters of O2: 4 . CBG: na  [name], Medical Director [name] is immediately available during today's Pulmonary Rehab session for [name] on 08/24/14 at 10:30am class time.

## 2014-08-29 ENCOUNTER — Encounter (HOSPITAL_COMMUNITY): Payer: BC Managed Care – PPO

## 2014-08-31 ENCOUNTER — Encounter (HOSPITAL_COMMUNITY): Payer: BC Managed Care – PPO

## 2014-09-05 ENCOUNTER — Encounter (HOSPITAL_COMMUNITY): Payer: BC Managed Care – PPO

## 2014-09-07 ENCOUNTER — Encounter (HOSPITAL_COMMUNITY)
Admission: RE | Admit: 2014-09-07 | Discharge: 2014-09-07 | Disposition: A | Discharge status: Home / Self Care | Payer: BC Managed Care – PPO | Source: Ambulatory Visit | Attending: Internal Medicine | Admitting: Internal Medicine

## 2014-09-07 DIAGNOSIS — Z5189 Encounter for other specified aftercare: Secondary | ICD-10-CM | POA: Diagnosis not present

## 2014-09-07 NOTE — Progress Notes (Signed)
Today, [name] exercised at [facility]. Service time was from 10:30am to 12:30pm.  The patient exercised for more than 31 minutes performing aerobic, strengthening, and stretching exercises. Oxygen saturation, heart rate, blood pressure, rate of perceived exertion, and shortness of breath were all monitored before, during, and after exercise. [name] presented with no problems at today's exercise session. Patient attended Warning Signs and Symptoms education with Haskel KhanJoan Behrens.  There was no workload change during today's exercise session.  Pre-exercise vitals: . Weight kg: 97.0 . Liters of O2: 4 . SpO2: 99 . HR: 95 . BP: 100/66 . CBG: na  Exercise vitals: . Highest heartrate:  126 . Lowest oxygen saturation: 83% increased to 94% decreased workload . Highest blood pressure: 96/54 . Liters of 02: 4  Post-exercise vitals: . SpO2: 100 . HR: 94 . BP: 96/62 . Liters of O2: 4 . CBG: na  [name], Medical Director Dr. Thedore MinsSingh is immediately available during today's Pulmonary Rehab session for [name] on 09/07/14 at 10:30am class time.

## 2014-09-12 ENCOUNTER — Encounter (HOSPITAL_COMMUNITY): Payer: Medicare Other

## 2014-09-12 ENCOUNTER — Ambulatory Visit: Payer: BC Managed Care – PPO | Admitting: Internal Medicine

## 2014-09-14 ENCOUNTER — Encounter (HOSPITAL_COMMUNITY): Payer: Medicare Other

## 2014-09-19 ENCOUNTER — Encounter (HOSPITAL_COMMUNITY)
Admission: RE | Admit: 2014-09-19 | Discharge: 2014-09-19 | Disposition: A | Discharge status: Home / Self Care | Payer: Medicare Other | Source: Ambulatory Visit | Attending: Internal Medicine | Admitting: Internal Medicine

## 2014-09-19 ENCOUNTER — Ambulatory Visit: Payer: Medicare Other | Admitting: Internal Medicine

## 2014-09-19 DIAGNOSIS — D869 Sarcoidosis, unspecified: Secondary | ICD-10-CM | POA: Insufficient documentation

## 2014-09-19 DIAGNOSIS — Z5189 Encounter for other specified aftercare: Secondary | ICD-10-CM | POA: Diagnosis not present

## 2014-09-19 NOTE — Progress Notes (Signed)
Today, [name] exercised at [facility]. Service time was from 1030 to 1155.  The patient exercised for more than 31 minutes performing aerobic, strengthening, and stretching exercises. Oxygen saturation, heart rate, blood pressure, rate of perceived exertion, and shortness of breath were all monitored before, during, and after exercise. [name] presented with no problems at today's exercise session.   There was no workload change during today's exercise session.  Pre-exercise vitals: . Weight kg: 97.3 . Liters of O2: ra . SpO2: 94 . HR: 118 . BP: 142/86 . CBG: na  Exercise vitals: . Highest heartrate:  124 . Lowest oxygen saturation: 84 which increased to 94 with purse lip breathing and rest. . Highest blood pressure: 100/60 . Liters of 02: 4  Post-exercise vitals: . SpO2: 98 . HR: 110 . BP: 100/60 . Liters of O2: 3 . CBG: na [name], Medical Director [name] is immediately available during today's Pulmonary Rehab session for [name] on 09/19/2014 at 1030 class time.  .Marland Kitchen

## 2014-09-20 ENCOUNTER — Ambulatory Visit (INDEPENDENT_AMBULATORY_CARE_PROVIDER_SITE_OTHER): Payer: Medicare Other | Admitting: Internal Medicine

## 2014-09-20 ENCOUNTER — Encounter: Payer: Self-pay | Admitting: Internal Medicine

## 2014-09-20 VITALS — BP 116/68 | HR 111 | Ht 68.0 in | Wt 213.0 lb

## 2014-09-20 DIAGNOSIS — J4521 Mild intermittent asthma with (acute) exacerbation: Secondary | ICD-10-CM

## 2014-09-20 DIAGNOSIS — J9611 Chronic respiratory failure with hypoxia: Secondary | ICD-10-CM

## 2014-09-20 DIAGNOSIS — D869 Sarcoidosis, unspecified: Secondary | ICD-10-CM

## 2014-09-20 DIAGNOSIS — K219 Gastro-esophageal reflux disease without esophagitis: Secondary | ICD-10-CM

## 2014-09-20 MED ORDER — AZITHROMYCIN 250 MG PO TABS: ORAL_TABLET | ORAL | Status: DC

## 2014-09-20 MED ORDER — PREDNISONE 10 MG PO TABS: ORAL_TABLET | ORAL | Status: DC

## 2014-09-20 MED ORDER — FLUTICASONE PROPIONATE 50 MCG/ACT NA SUSP: 2.0000 | Freq: Every morning | NASAL | Status: DC

## 2014-09-20 NOTE — Assessment & Plan Note (Signed)
Pfts 08/22/2013 nl lung vols,  dlco 53 corrects to 73% - PFTs 05/16/2014  Nl lung vols,  DLCO 42% corrects to 67%  - prednisone daily restarted 03/08/14  > stopped by pt around 9/15 /15  No evidence of active dz/ no need for chronic steroids

## 2014-09-20 NOTE — Assessment & Plan Note (Signed)
Explained the natural history of uri and why it's necessary in patients at risk to treat GERD aggressively - at least  short term -   to reduce risk of evolving cyclical cough initially  triggered by epithelial injury and a heightened sensitivty to the effects of any upper airway irritants,  most importantly acid - related - then perpetuated by epithelial injury related to the cough itself as the upper airway collapses on itself.  That is, the more sensitive the epithelium becomes once it is damaged by the virus, the more the ensuing irritability> the more the cough, the more the secondary reflux (especially in those prone to reflux) the more the irritation of the sensitive mucosa and so on in a  Classic cyclical pattern.    Reviewed diet/ control of cyclcal cough

## 2014-09-20 NOTE — Patient Instructions (Signed)
zpak  Prednisone 10 mg take  4 each am x 2 days,   2 each am x 2 days,  1 each am x 2 days and stop   See calendar for specific medication instructions and bring it back for each and every office visit for every healthcare provider you see.  Without it,  you may not receive the best quality medical care that we feel you deserve.  You will note that the calendar groups together  your maintenance  medications that are timed at particular times of the day.  Think of this as your checklist for what your doctor has instructed you to do until your next evaluation to see what benefit  there is  to staying on a consistent group of medications intended to keep you well.  The other group at the bottom is entirely up to you to use as you see fit  for specific symptoms that may arise between visits that require you to treat them on an as needed basis.  Think of this as your action plan or "what if" list.   Separating the top medications from the bottom group is fundamental to providing you adequate care going forward.   

## 2014-09-20 NOTE — Assessment & Plan Note (Addendum)
Onset: late teens Triggers (environmental, infectious, allergic): foods, odors Rescue inhaler XLK:GMWNUUuse:rarely Maintenance medications/ response: Symbicort twice a day Smoking history:quit 1973 Family history pulmonary disease: adopted  - PFT's 08/22/2013  FEV1  1.96 (103%); ratio 83 and no change p B2 and nl FEF 25/75 p am symbicort, dLco 53 corrects to 72%> rec trial off symbicort > no change symptoms x months then flared 02/2014 and started back on pred daily    - 05/16/14 pFT's s airflow obst   Acute moderately severe flare of symptoms but Still not clear how much asthma vs pseudoasthma but one way to sort out is have her start back on symbiocort 160 immediately for any flare > if ashtma will help, if UACS won't help and may make it worse  The proper method of use, as well as anticipated side effects, of a metered-dose inhaler are discussed and demonstrated to the patient. Improved effectiveness after extensive coaching during this visit to a level of approximately  75%   rx acute flare with zpak and Prednisone 10 mg take  4 each am x 2 days,   2 each am x 2 days,  1 each am x 2 days and stop     Each maintenance medication was reviewed in detail including most importantly the difference between maintenance and as needed and under what circumstances the prns are to be used. This was done in the context of a medication calendar review which provided the patient with a user-friendly unambiguous mechanism for medication administration and reconciliation and provides an action plan for all active problems. It is critical that this be shown to every doctor  for modification during the office visit if necessary so the patient can use it as a working document.

## 2014-09-20 NOTE — Progress Notes (Signed)
Subjective:    Patient ID: [name], female    DOB: Mar 13, 1951 MRN: [id number]    Brief patient profile:  [id number] yobf quit smoking in 1980 and dx with sarcoidosis in 1996 but free of evidence of active sarcoid off steroids when last seen in pulmonary clinic in 04/2010 with predominantly upper airway cough    07/11/2013 ov/ Wert  Chief Complaint  Patient presents with  . Pulmonary Consult    Pt last seen here in 2009- referred back per Dr. Frederik PearHopper's request. Pt c/o cough for 2 yrs and SOB on and off for the past 6 months.  Cough is occ prod with very minimal yellow sputum.  She states that she gets out of breath walking minimal distances such as from room to room at home and from parking lot to building today.   always better p prednisone but 2 weeks later  symptoms resume, last time was Feb 2014 and gradual onset progressive symptoms since then of  Cough = sob, better p saba,  Cough seems worse p supper, does not typically wake her up.  Only using rescue once or twice weekly and says symbicort works when she takes it.  Has h/o GERD / Barrett's denies being told to stay on ppi rec Prednisone 10 mg take  4 each am x 2 days,   2 each am x 2 days,  1 each am x 2 days and stop  Symbicort 160 Take 2 puffs first thing in am and then another 2 puffs about 12 hours later.  Work on inhaler technique:   Only use your albuterol (ventolin) as rescue GERD   Please remember to go to the  x-ray department downstairs for your tests - we will call you with the results when they are available. Please schedule a follow up office visit in 6 weeks, call sooner if needed with pfts  add :  Need to sort out ? Of Barrett's and whether she needs lifetime ppi/ gi     Admit date: 03/07/2014  Discharge date: 03/09/2014  Discharge Diagnoses:  Acute on chronic hypoxemic respiratory failure  Baseline stage 4 sarcoid  Chronic cough  GERD  DISCHARGE PLAN BY DIAGNOSIS  Acute on chronic hypoxemic respiratory failure   Baseline stage 4 sarcoid  Chronic cough  Discharge Plan:  - Supplemental home O2.  - Prednisone taper.  - Continue 7 days of doxycycline.  - Tramadol 50mg  q6h.  - Respiratory virus panel results pending.  - F/u appt with Pulmonary as scheduled below.  GERD  Discharge Plan:  - Continue PPI (over the counter).  DISCHARGE SUMMARY  [name] is a 64 y.o. y/o AA female with a PMH of Pulmonary Sarcoidosis, Asthma, GERD, Barrett's, Hiatal hernia, Migraines, IBS, Anxiety. She was seen by her PCP [name] on 6/30 for cough, fever, tachycardia, SOB x 10 days. In office, SpO2 82%. PCP sent her to ED for further evaluation.  In ED, CT angio was obtained and was negative for PE and demonstrated findings c/w sarcoidosis. She was treated with nebulizers, put on supplemental O2, and [facility].  She was started on IV steroids and given empiric abx (doxycycline).  On 7/2, her symptoms were markedly improved and she was deemed safe for discharge home. She is scheduled for a follow up outpatient visit with pulmonary as noted below.  SIGNIFICANT DIAGNOSTIC STUDIES / EVENTS  6/30 admit  6/30 CTA >>> neg for PE , cystic lucencies/fibrosis in a perilymphatic/peribronchovascular distribution with subpleural reticulation/fibrosis,  related to patient's known sarcoidosis.  MICRO DATA  Blood 6/30 >>> ngtd  Urine 6/30 >>> ? contaminant  Autoimmune Panel 6/30 >>> RF high, ANA neg, CCP and HP neg ANTIBIOTICS  Doxycycline 6/30 >>> (will be discharged with 7 days outpatient therapy).      05/16/2014 f/u ov/Wert re: burned out sarcoid/ 02 dep resp failure 24/7 02  Chief Complaint  Patient presents with  . Followup with PFT    Pt states that hoarseness is slightly better. She c/o minimal swelling in her rt foot.    not really limited from desired acitvities on 02 but notes desats with exertion on 2lpm rec No change rx Pt stopped prednisone ? Date   06/28/2014 f/u ov/Wert re: 02 dep resp failure/  sarcoid off pred since ? But no worse off  Chief Complaint  Patient presents with  . Follow-up    Breathing is unchaged since the last visit.  No new co's today. She has used rescue inhaler x 2 this past wk.   doing well with rehab though needing more 02 with the heaviest of exertion than the 2lpm we prev recommended.  No more sob on 02 at baseline activity, though. >DME order for new portable O2 .   07/13/2014 Follow and Med Review 02 dep resp failure/ sarcoid  Patient returns for a two-week follow-up and medication review. Says overall she is doing okay with her breathing .  Reviewed all her medications organize them into a medication calendar. Patient education and thought appears that she is taking her medications correctly. Says she is doing well with pulmonary rehab, really loves the program.  Is being evaluated by DME for new portable O2 device.  She denies any chest pain, orthopnea, PND, or increased leg swelling rec No change rx/ follow med calendar    09/20/2014 acute ov/Wert re: ?uri/ ? asthma exac/not following med calendar action plans  "I have my meds on my cell phone" Chief Complaint  Patient presents with  . Follow-up    pt c/o sob with lots of exertion, prod cough X1 week with green mucus.  is requesting abx and prednisone. Needs nasula cannula with "mustache" according to pulmonary rehab.    prior to acute onset cough was not using any inhalers at all and was supposed to start back on symbicort 160 immediately for any flare as per action plan but hasn't started as yet despite back on ventolin with severe sob over baseline mostly just while coughing but ok with adls and even still doing rehab despite flare of cough and sob    No obvious day to day or daytime variabilty or assoc   cp or chest tightness, subjective wheeze overt sinus or hb symptoms. No unusual exp hx or h/o childhood pna/ asthma or knowledge of premature birth.  Sleeping ok without nocturnal  or early am  exacerbation  of respiratory  c/o's or need for noct saba. Also denies any obvious fluctuation of symptoms with weather or environmental changes or other aggravating or alleviating factors except as outlined above   Current Medications, Allergies, Complete Past Medical History, Past Surgical History, Family History, and Social History were reviewed in [employer] record.  ROS  The following are not active complaints unless bolded sore throat, dysphagia, dental problems, itching, sneezing,  nasal congestion or excess/ purulent secretions, ear ache,   fever, chills, sweats, unintended wt loss, pleuritic or exertional cp, hemoptysis,  orthopnea pnd or leg swelling, presyncope, palpitations, heartburn, abdominal pain,  anorexia, nausea, vomiting, diarrhea  or change in bowel or urinary habits, change in stools or urine, dysuria,hematuria,  rash, arthralgias, visual complaints, headache, numbness weakness or ataxia or problems with walking or coordination,  change in mood/affect or memory.         .  .            Objective:   Physical Exam  amb bf nad minimal pseudowheeze  08/22/2013     204 > 03/20/2014   214  >214 04/03/2014 > 04/11/2014  220 > 227 04/26/2014 > 05/16/2014  227> 06/28/2014  228 >228 07/13/2014 > 09/20/2014  213  HEENT: nl dentition, turbinates, and orophanx. Nl external ear canals without cough reflex   NECK :  without JVD/Nodes/TM/ nl carotid upstrokes bilaterally   LUNGS: no acc muscle use, clear bilaterally    CV:  RRR  no s3 or murmur or increase in P2, no edema   ABD:  soft and nontender with nl excursion in the supine position. No bruits or organomegaly, bowel sounds nl  MS:  warm without deformities, calf tenderness, cyanosis or clubbing  SKIN: warm and dry without lesions       CTa 03/07/14 No evidence of pulmonary embolism to the segmental level.  Findings of pulmonary sarcoidosis with associated mediastinal and  hilar lymphadenopathy.        Assessment & Plan:

## 2014-09-20 NOTE — Assessment & Plan Note (Signed)
-   [facility] >   rx 2lpm 24/7  - 05/16/2014   Walked 2lpm x one lap @ 185 stopped due to  desat to 88% at end with sob resolved on 3lpm - 05/17/2014 referred to rehab - 06/20/14  walked 674 feet with 2 breaks. The patient's lowest oxygen saturation was 84% , highest heart rate was 124 , and highest blood pressure was 124/80. The patient was on 6 liters of oxygen with a nasal cannula. [name] stated that fatigue hindered their walk test.    rx as of  06/28/14    2lpm except 3lpm with activity > room to room  and titrate to > 90% with exertion    Adequate control on present rx, reviewed > no change in rx needed  Even / despite acute moderate flare of cough/sob  Does neet oxymizer for use at rehab/ reviewed/ requested

## 2014-09-21 ENCOUNTER — Encounter (HOSPITAL_COMMUNITY): Payer: Medicare Other

## 2014-09-26 ENCOUNTER — Encounter (HOSPITAL_COMMUNITY): Payer: Medicare Other

## 2014-09-28 ENCOUNTER — Encounter (HOSPITAL_COMMUNITY): Payer: Medicare Other

## 2014-10-03 ENCOUNTER — Encounter (HOSPITAL_COMMUNITY): Payer: Medicare Other

## 2014-10-03 ENCOUNTER — Telehealth: Payer: Self-pay | Admitting: Internal Medicine

## 2014-10-03 MED ORDER — LEVOFLOXACIN 500 MG PO TABS: 500.0000 mg | ORAL_TABLET | Freq: Every day | ORAL | Status: DC

## 2014-10-03 NOTE — Telephone Encounter (Signed)
Spoke with pt, she was seen on 1/13 and given zpak and pred taper.  She finished them last Tuesday, is still c/o prod cough with "pus" colored mucus.  (I asked if this was white or yellow, and she just said like pus. ) Denies sob, sinus congestion, fever, chest pain.  Pt is requesting another abx and [facility] [address].     Dr Sherene SiresWert please advise.  Thanks!

## 2014-10-03 NOTE — Telephone Encounter (Signed)
levaquin 500 mg daily x 7 days then ov if not better

## 2014-10-03 NOTE — Telephone Encounter (Signed)
Spoke with pt and advised of Dr Wert's recommendations.  Rx sent to pharmacy. 

## 2014-10-10 ENCOUNTER — Encounter (HOSPITAL_COMMUNITY)
Admission: RE | Admit: 2014-10-10 | Discharge: 2014-10-10 | Disposition: A | Discharge status: Home / Self Care | Payer: Medicare Other | Source: Ambulatory Visit | Attending: Internal Medicine | Admitting: Internal Medicine

## 2014-10-10 DIAGNOSIS — Z5189 Encounter for other specified aftercare: Secondary | ICD-10-CM | POA: Insufficient documentation

## 2014-10-10 DIAGNOSIS — D869 Sarcoidosis, unspecified: Secondary | ICD-10-CM | POA: Insufficient documentation

## 2014-10-10 NOTE — Progress Notes (Signed)
Today, [name] exercised at [facility]. Service time was from 10:30 to 12:00p.  The patient exercised for more than 31 minutes performing aerobic, strengthening, and stretching exercises. Oxygen saturation, heart rate, blood pressure, rate of perceived exertion, and shortness of breath were all monitored before, during, and after exercise. [name] presented with no problems at today's exercise session.   There was an increase in workload change during today's exercise session.  Pre-exercise vitals: . Weight kg: 99.7 . Liters of O2: 4 . SpO2: 95 . HR: 116 . BP: 100/64 . CBG: na  Exercise vitals: . Highest heartrate:  140 . Lowest oxygen saturation: 88% . Highest blood pressure: 96/64 . Liters of 02: 4  Post-exercise vitals: . SpO2: 99 . HR: 110 . BP: 94/50 . Liters of O2: 3 . CBG: na  [name], Medical Director [name] is immediately available during today's Pulmonary Rehab session for [name] on 10/10/14 at 10:30am class time.

## 2014-10-12 ENCOUNTER — Encounter (HOSPITAL_COMMUNITY): Payer: Medicare Other

## 2014-10-12 ENCOUNTER — Encounter (HOSPITAL_COMMUNITY)
Admission: RE | Admit: 2014-10-12 | Discharge: 2014-10-12 | Disposition: A | Discharge status: Home / Self Care | Payer: Medicare Other | Source: Ambulatory Visit | Attending: *Deleted | Admitting: *Deleted

## 2014-10-12 DIAGNOSIS — Z5189 Encounter for other specified aftercare: Secondary | ICD-10-CM | POA: Diagnosis not present

## 2014-10-12 NOTE — Progress Notes (Signed)
Today, [name] exercised at [facility]. Service time was from 10:30am to 12:15pm.  The patient exercised for more than 31 minutes performing aerobic, strengthening, and stretching exercises. Oxygen saturation, heart rate, blood pressure, rate of perceived exertion, and shortness of breath were all monitored before, during, and after exercise. [name] presented with no problems at today's exercise session. The patient attended education today with Respiratory Therapy.   There was no workload change during today's exercise session.  Pre-exercise vitals: . Weight kg: 96.5 . Liters of O2: 3 . SpO2: 94 . HR: 102 . BP: 88/52 . CBG: na  Exercise vitals: . Highest heartrate:  128 . Lowest oxygen saturation: 94 . Highest blood pressure: 100/64 . Liters of 02: 4  Post-exercise vitals: . SpO2: 91 . HR: 101 . BP: 106/64 . Liters of O2: 3 . CBG: na  [name], Medical Director [name] is immediately available during today's Pulmonary Rehab session for [name] on 10/12/14 at 10:30am class time.

## 2014-10-14 ENCOUNTER — Encounter: Payer: Self-pay | Admitting: Internal Medicine

## 2014-10-17 ENCOUNTER — Encounter (HOSPITAL_COMMUNITY)
Admission: RE | Admit: 2014-10-17 | Discharge: 2014-10-17 | Disposition: A | Discharge status: Home / Self Care | Payer: Medicare Other | Source: Ambulatory Visit | Attending: *Deleted | Admitting: *Deleted

## 2014-10-17 ENCOUNTER — Encounter (HOSPITAL_COMMUNITY): Payer: Medicare Other

## 2014-10-17 DIAGNOSIS — Z5189 Encounter for other specified aftercare: Secondary | ICD-10-CM | POA: Diagnosis not present

## 2014-10-17 NOTE — Progress Notes (Signed)
Today, [name] exercised at [facility]. Service time was from 1030 to 1210.  The patient exercised for more than 31 minutes performing aerobic, strengthening, and stretching exercises. Oxygen saturation, heart rate, blood pressure, rate of perceived exertion, and shortness of breath were all monitored before, during, and after exercise. [name] presented with no problems at today's exercise session.   There was no workload change during today's exercise session.  Pre-exercise vitals: . Weight kg: 96.2 . Liters of O2: 3L . SpO2: 96 . HR: 111 . BP: 92/60 . CBG: na  Exercise vitals: . Highest heartrate:  130 . Lowest oxygen saturation: 80 increased to 96 with PLB and rest break . Highest blood pressure: 100/56 . Liters of 02: 6L  Post-exercise vitals: . SpO2: 94 . HR: 114 . BP: 112/74 . Liters of O2: 3L . CBG: na  [name], Medical Director [name] is immediately available during today's Pulmonary Rehab session for [name] on 10/17/2014 at 1030 class time.

## 2014-10-19 ENCOUNTER — Encounter (HOSPITAL_COMMUNITY): Payer: Medicare Other

## 2014-10-19 ENCOUNTER — Encounter (HOSPITAL_COMMUNITY)
Admission: RE | Admit: 2014-10-19 | Discharge: 2014-10-19 | Disposition: A | Discharge status: Home / Self Care | Payer: Medicare Other | Source: Ambulatory Visit | Attending: *Deleted | Admitting: *Deleted

## 2014-10-19 DIAGNOSIS — Z5189 Encounter for other specified aftercare: Secondary | ICD-10-CM | POA: Diagnosis not present

## 2014-10-19 NOTE — Progress Notes (Signed)
Today, Genavive exercised at [facility]. Service time was from 10:30a to 12:30p.  The patient exercised for more than 31 minutes performing aerobic, strengthening, and stretching exercises. Oxygen saturation, heart rate, blood pressure, rate of perceived exertion, and shortness of breath were all monitored before, during, and after exercise. Britta MccreedyBarbara presented with no problems at today's exercise session. The patient attended education with [employer].  There was an increase in workload change during today's exercise session.  Pre-exercise vitals: . Weight kg: 97.1 . Liters of O2: 2 . SpO2: 94 . HR: 96 . BP: 98/60 . CBG: na  Exercise vitals: . Highest heartrate:  125 . Lowest oxygen saturation: 95 . Highest blood pressure: 142/70 . Liters of 02: 4  Post-exercise vitals: . SpO2: 97 . HR: 103 . BP: 102/64 . Liters of O2: 3 . CBG: na  [name], Medical Director Dr. Thedore MinsSingh is immediately available during today's Pulmonary Rehab session for Margaretha SeedsBarbara V Sandhu on 10/19/14 at 10:30am class time.

## 2014-10-20 ENCOUNTER — Telehealth: Payer: Self-pay | Admitting: Internal Medicine

## 2014-10-20 MED ORDER — BUDESONIDE-FORMOTEROL FUMARATE 160-4.5 MCG/ACT IN AERO: 2.0000 | INHALATION_SPRAY | Freq: Two times a day (BID) | RESPIRATORY_TRACT | Status: DC

## 2014-10-20 NOTE — Telephone Encounter (Signed)
Spoke with pt.  Ver[facility]  Rx sent - pt aware and voiced no further questions or concerns at this time.

## 2014-10-24 ENCOUNTER — Encounter (HOSPITAL_COMMUNITY): Admission: RE | Admit: 2014-10-24 | Payer: Medicare Other | Source: Ambulatory Visit

## 2014-10-24 ENCOUNTER — Encounter (HOSPITAL_COMMUNITY): Payer: Medicare Other

## 2014-10-24 ENCOUNTER — Telehealth (HOSPITAL_COMMUNITY): Payer: Self-pay | Admitting: *Deleted

## 2014-10-26 ENCOUNTER — Encounter (HOSPITAL_COMMUNITY)
Admission: RE | Admit: 2014-10-26 | Discharge: 2014-10-26 | Disposition: A | Discharge status: Home / Self Care | Payer: Medicare Other | Source: Ambulatory Visit | Attending: *Deleted | Admitting: *Deleted

## 2014-10-26 ENCOUNTER — Encounter (HOSPITAL_COMMUNITY): Payer: Medicare Other

## 2014-10-26 DIAGNOSIS — Z5189 Encounter for other specified aftercare: Secondary | ICD-10-CM | POA: Diagnosis not present

## 2014-10-26 NOTE — Progress Notes (Signed)
Today, Joycelin exercised at [facility]. Service time was from 1030 to 1215.  The patient exercised for more than 31 minutes performing aerobic, strengthening, and stretching exercises. Oxygen saturation, heart rate, blood pressure, rate of perceived exertion, and shortness of breath were all monitored before, during, and after exercise. Britta MccreedyBarbara presented with no problems at today's exercise session. Britta MccreedyBarbara also attended an education session on exercise for the pulmonary patient.  There was a workload change during today's exercise session.  Pre-exercise vitals: . Weight kg: 96.3 . Liters of O2: 2L . SpO2: 97 . HR: 107 . BP: 98/62 . CBG: na  Exercise vitals: . Highest heartrate:  136 . Lowest oxygen saturation: 91 . Highest blood pressure: 100/60 . Liters of 02: 4L  Post-exercise vitals: . SpO2: 97 . HR: 115 . BP: 96/70 . Liters of O2: 3L . CBG: na  [name], Medical Director Dr. Thedore MinsSingh is immediately available during today's Pulmonary Rehab session for Margaretha SeedsBarbara V Pierro on 10/26/2014 at 1030 class time.

## 2014-10-31 ENCOUNTER — Encounter (HOSPITAL_COMMUNITY): Payer: Medicare Other

## 2014-11-02 ENCOUNTER — Encounter (HOSPITAL_COMMUNITY): Payer: Medicare Other

## 2014-11-04 ENCOUNTER — Other Ambulatory Visit: Payer: Self-pay | Admitting: Internal Medicine

## 2014-11-06 ENCOUNTER — Telehealth: Payer: Self-pay | Admitting: Internal Medicine

## 2014-11-06 MED ORDER — FAMOTIDINE 20 MG PO TABS: 20.0000 mg | ORAL_TABLET | Freq: Every day | ORAL | Status: DC

## 2014-11-06 MED ORDER — PANTOPRAZOLE SODIUM 40 MG PO TBEC: DELAYED_RELEASE_TABLET | ORAL | Status: DC

## 2014-11-06 NOTE — Telephone Encounter (Signed)
Refills have been sent in. Pt is aware. Nothing further was needed.

## 2014-11-07 ENCOUNTER — Encounter (HOSPITAL_COMMUNITY): Payer: Medicare Other

## 2014-11-07 ENCOUNTER — Encounter (HOSPITAL_COMMUNITY)
Admission: RE | Admit: 2014-11-07 | Discharge: 2014-11-07 | Disposition: A | Discharge status: Home / Self Care | Payer: Medicare Other | Source: Ambulatory Visit | Attending: *Deleted | Admitting: *Deleted

## 2014-11-07 DIAGNOSIS — D869 Sarcoidosis, unspecified: Secondary | ICD-10-CM | POA: Insufficient documentation

## 2014-11-07 DIAGNOSIS — Z5189 Encounter for other specified aftercare: Secondary | ICD-10-CM | POA: Diagnosis not present

## 2014-11-07 NOTE — Progress Notes (Signed)
Today, [name] exercised at [facility]. Service time was from 10:30am to 12:15pm.  The patient exercised for more than 31 minutes performing aerobic, strengthening, and stretching exercises. Oxygen saturation, heart rate, blood pressure, rate of perceived exertion, and shortness of breath were all monitored before, during, and after exercise. [name] presented with no problems at today's exercise session.   There was no workload change during today's exercise session.  Pre-exercise vitals: . Weight kg: 98.0 . Liters of O2: 2 . SpO2: 97 . HR: 113 . BP: 100/60 . CBG: na  Exercise vitals: . Highest heartrate:  124 . Lowest oxygen saturation: 86% . Highest blood pressure: 94/62 . Liters of 02: 4  Post-exercise vitals: . SpO2: 97 . HR: 103 . BP: 92/62 . Liters of O2: 3 . CBG: na  [name], Medical Director Dr. Thedore MinsSingh is immediately available during today's Pulmonary Rehab session for [name] on 11/07/14 at 10:30am class time.

## 2014-11-09 ENCOUNTER — Encounter (HOSPITAL_COMMUNITY)
Admission: RE | Admit: 2014-11-09 | Discharge: 2014-11-09 | Disposition: A | Discharge status: Home / Self Care | Payer: Medicare Other | Source: Ambulatory Visit | Attending: *Deleted | Admitting: *Deleted

## 2014-11-09 ENCOUNTER — Encounter (HOSPITAL_COMMUNITY): Payer: Medicare Other

## 2014-11-09 DIAGNOSIS — Z5189 Encounter for other specified aftercare: Secondary | ICD-10-CM | POA: Diagnosis not present

## 2014-11-09 NOTE — Progress Notes (Signed)
Today, Pleasant exercised at [facility]. Service time was from 10:30am to 12:40pm.  The patient exercised for more than 31 minutes performing aerobic, strengthening, and stretching exercises. Oxygen saturation, heart rate, blood pressure, rate of perceived exertion, and shortness of breath were all monitored before, during, and after exercise. [name] presented with no problems at today's exercise session. The patient attended education today with [name] on Anatomy, Physiology and Intimacy.  There was no workload change during today's exercise session.  Pre-exercise vitals: . Weight kg: 98.6 . Liters of O2: 2 . SpO2: 98 . HR: 115 . BP: 96/50 . CBG: na  Exercise vitals: . Highest heartrate:  125 . Lowest oxygen saturation: 88% . Highest blood pressure:  . Liters of 02: 4  Post-exercise vitals: . SpO2: 98 . HR: 106 . BP: 106/72 . Liters of O2: 2 . CBG: na  [name], Medical Director Dr. Randol KernElgergawy is immediately available during today's Pulmonary Rehab session for [name] on 11/09/14 at 10:30am class time.

## 2014-11-14 ENCOUNTER — Encounter (HOSPITAL_COMMUNITY)
Admission: RE | Admit: 2014-11-14 | Discharge: 2014-11-14 | Disposition: A | Discharge status: Home / Self Care | Payer: Medicare Other | Source: Ambulatory Visit | Attending: *Deleted | Admitting: *Deleted

## 2014-11-14 ENCOUNTER — Encounter (HOSPITAL_COMMUNITY): Payer: Medicare Other

## 2014-11-14 DIAGNOSIS — Z5189 Encounter for other specified aftercare: Secondary | ICD-10-CM | POA: Diagnosis not present

## 2014-11-14 NOTE — Progress Notes (Signed)
Today, Royalty exercised at [facility]. Service time was from 10:30am to 12:10pm.  The patient exercised for more than 31 minutes performing aerobic, strengthening, and stretching exercises. Oxygen saturation, heart rate, blood pressure, rate of perceived exertion, and shortness of breath were all monitored before, during, and after exercise. Britta MccreedyBarbara presented with no problems at today's exercise session.   There was an increase in workload change during today's exercise session.  Pre-exercise vitals: . Weight kg: 97.2 . Liters of O2: 2 . SpO2: 97 . HR: 118 . BP: 124/62 . CBG: na  Exercise vitals: . Highest heartrate:  127 . Lowest oxygen saturation: 93 . Highest blood pressure: 100/60 . Liters of 02: 4  Post-exercise vitals: . SpO2: 98 . HR: 110 . BP: 112/74 . Liters of O2: 2 . CBG: na  [name], Medical Director Dr. Randol KernElgergawy is immediately available during today's Pulmonary Rehab session for Margaretha SeedsBarbara V Bellmore on 11/14/14 at 10:30am class time.

## 2014-11-16 ENCOUNTER — Encounter (HOSPITAL_COMMUNITY): Payer: Medicare Other

## 2014-11-16 ENCOUNTER — Encounter (HOSPITAL_COMMUNITY)
Admission: RE | Admit: 2014-11-16 | Discharge: 2014-11-16 | Disposition: A | Discharge status: Home / Self Care | Payer: Medicare Other | Source: Ambulatory Visit | Attending: *Deleted | Admitting: *Deleted

## 2014-11-16 DIAGNOSIS — Z5189 Encounter for other specified aftercare: Secondary | ICD-10-CM | POA: Diagnosis not present

## 2014-11-16 NOTE — Progress Notes (Signed)
Today, [name] [facility]. Service time was from 10:30am to 12:15pm.  The patient exercised for more than 31 minutes performing aerobic, strengthening, and stretching exercises. Oxygen saturation, heart rate, blood pressure, rate of perceived exertion, and shortness of breath were all monitored before, during, and after exercise. [name] presented with no problems at today's exercise session.  The patient attended education class today with the pharmacist on Pulmonary Medicines.   There was no workload change during today's exercise session.  Pre-exercise vitals: . Weight kg: 97.4 . Liters of O2: 2 . SpO2: 99 . HR: 102 . BP: 100/64 . CBG: na  Exercise vitals: . Highest heartrate:  120 . Lowest oxygen saturation: 92 . Highest blood pressure: 120/80 . Liters of 02: 4  Post-exercise vitals: . SpO2: 97 . HR: 98 . BP:  . Liters of O2: 4 . CBG: na  [name], Medical Director Dr. Randol KernElgergawy is immediately available during today's Pulmonary Rehab session for [name] on 11/15/13 at 10:30am class time.

## 2014-11-21 ENCOUNTER — Encounter (HOSPITAL_COMMUNITY): Payer: Medicare Other

## 2014-11-23 ENCOUNTER — Encounter (HOSPITAL_COMMUNITY): Payer: Medicare Other

## 2014-11-28 ENCOUNTER — Encounter (HOSPITAL_COMMUNITY): Payer: Medicare Other

## 2014-11-30 ENCOUNTER — Encounter (HOSPITAL_COMMUNITY): Payer: Medicare Other

## 2014-12-04 ENCOUNTER — Telehealth: Payer: Self-pay | Admitting: Internal Medicine

## 2014-12-04 MED ORDER — LORAZEPAM 0.5 MG PO TABS: ORAL_TABLET | ORAL | Status: DC

## 2014-12-04 NOTE — Telephone Encounter (Signed)
Pt called in requesting refill for LORazepam (ATIVAN) 0.5 MG tablet [[id number]  [facility]

## 2014-12-04 NOTE — Telephone Encounter (Signed)
#  30 1/2-1 q 8-12 hrs prn only

## 2014-12-04 NOTE — Telephone Encounter (Signed)
[facility] 

## 2014-12-05 ENCOUNTER — Encounter (HOSPITAL_COMMUNITY): Payer: Medicare Other

## 2014-12-07 ENCOUNTER — Encounter (HOSPITAL_COMMUNITY): Payer: Medicare Other

## 2014-12-11 ENCOUNTER — Telehealth: Payer: Self-pay | Admitting: Internal Medicine

## 2014-12-11 DIAGNOSIS — D869 Sarcoidosis, unspecified: Secondary | ICD-10-CM

## 2014-12-11 NOTE — Telephone Encounter (Signed)
Yes but she may not qualify for it due to high 02 needs with exertion

## 2014-12-11 NOTE — Telephone Encounter (Signed)
Pt returned call - [phone]

## 2014-12-11 NOTE — Telephone Encounter (Signed)
lmtcb X1 for patient 

## 2014-12-11 NOTE — Telephone Encounter (Signed)
Spoke with patient - aware of rec's and information per MW. Order placed for POC.  Nothing further needed.

## 2014-12-11 NOTE — Telephone Encounter (Signed)
Called and spoke to pt. Pt requesting an order for POC. Pt stated she is wanting to be more mobile.   MW please advise if order can be placed for POC. Thanks.

## 2014-12-12 ENCOUNTER — Encounter (HOSPITAL_COMMUNITY)
Admission: RE | Admit: 2014-12-12 | Discharge: 2014-12-12 | Disposition: A | Discharge status: Home / Self Care | Payer: Medicare Other | Source: Ambulatory Visit | Attending: Internal Medicine | Admitting: Internal Medicine

## 2014-12-12 ENCOUNTER — Encounter (HOSPITAL_COMMUNITY): Payer: Medicare Other

## 2014-12-12 DIAGNOSIS — D869 Sarcoidosis, unspecified: Secondary | ICD-10-CM | POA: Diagnosis not present

## 2014-12-12 DIAGNOSIS — Z5189 Encounter for other specified aftercare: Secondary | ICD-10-CM | POA: Insufficient documentation

## 2014-12-12 NOTE — Progress Notes (Signed)
Britta MccreedyBarbara completed a Six-Minute Walk Test on 12/12/14 . Lounell walked 1147 feet. I stopped the patient for one minute between minute 3 and 4 due to desaturation. The patient was walking at a very fast pace.  The patient's lowest oxygen saturation was 81 %, highest heart rate was 146 bpm , and highest blood pressure was 120/80. The patient was on 4 liters of oxygen with a nasal cannula to begin with but slowly titrated to 8 liters due to desaturation. At 6 minutes patient was 81% on 8 liters. Patient stated that dry mouth hindered her walk test.

## 2014-12-14 NOTE — Progress Notes (Signed)
Pulmonary Rehabilitation Discharge Note: [name] has been discharged from pulmonary rehabilitation after successfully completing 23 exercise/education sessions. [name] increased her stamina and strength as evidenced by her ability to walk an additional 473 feet on her discharge walk test as compared to her entry 6 min walk test. [name] met her personal goal of being able to increase her physical activity without an increase of SOB. She is able to travel to visit her son and grandchildren with more comfort and ease. She is in the process of meeting her second personal goal. She would like to have a portable oxygen concentrator now that she is more mobile. [name] is going to continue her exercise routine in our pulmonary maintenance program.

## 2014-12-19 ENCOUNTER — Encounter (HOSPITAL_COMMUNITY): Payer: Medicare Other

## 2014-12-21 ENCOUNTER — Encounter (HOSPITAL_COMMUNITY): Payer: Medicare Other

## 2014-12-26 ENCOUNTER — Encounter (HOSPITAL_COMMUNITY)
Admission: RE | Admit: 2014-12-26 | Discharge: 2014-12-26 | Disposition: A | Discharge status: Home / Self Care | Payer: Medicare Other | Source: Ambulatory Visit | Attending: Internal Medicine | Admitting: Internal Medicine

## 2014-12-26 DIAGNOSIS — Z5189 Encounter for other specified aftercare: Secondary | ICD-10-CM | POA: Diagnosis not present

## 2014-12-28 ENCOUNTER — Encounter (HOSPITAL_COMMUNITY)
Admission: RE | Admit: 2014-12-28 | Discharge: 2014-12-28 | Disposition: A | Discharge status: Home / Self Care | Payer: Medicare Other | Source: Ambulatory Visit | Attending: Internal Medicine | Admitting: Internal Medicine

## 2015-01-02 ENCOUNTER — Encounter (HOSPITAL_COMMUNITY)
Admission: RE | Admit: 2015-01-02 | Discharge: 2015-01-02 | Disposition: A | Discharge status: Home / Self Care | Payer: Medicare Other | Source: Ambulatory Visit | Attending: Internal Medicine | Admitting: Internal Medicine

## 2015-01-04 ENCOUNTER — Encounter (HOSPITAL_COMMUNITY): Payer: Medicare Other

## 2015-01-09 ENCOUNTER — Encounter (HOSPITAL_COMMUNITY): Payer: Medicare Other | Attending: Internal Medicine

## 2015-01-09 DIAGNOSIS — Z5189 Encounter for other specified aftercare: Secondary | ICD-10-CM | POA: Insufficient documentation

## 2015-01-09 DIAGNOSIS — D869 Sarcoidosis, unspecified: Secondary | ICD-10-CM | POA: Diagnosis not present

## 2015-01-11 ENCOUNTER — Encounter (HOSPITAL_COMMUNITY): Payer: Medicare Other

## 2015-01-16 ENCOUNTER — Encounter (HOSPITAL_COMMUNITY): Payer: Medicare Other

## 2015-01-18 ENCOUNTER — Encounter (HOSPITAL_COMMUNITY): Payer: Medicare Other

## 2015-01-23 ENCOUNTER — Encounter (HOSPITAL_COMMUNITY): Payer: Medicare Other

## 2015-01-24 ENCOUNTER — Encounter: Payer: Medicare Other | Admitting: Internal Medicine

## 2015-01-24 DIAGNOSIS — Z0289 Encounter for other administrative examinations: Secondary | ICD-10-CM

## 2015-01-25 ENCOUNTER — Encounter (HOSPITAL_COMMUNITY): Payer: Medicare Other

## 2015-01-26 ENCOUNTER — Ambulatory Visit (INDEPENDENT_AMBULATORY_CARE_PROVIDER_SITE_OTHER): Payer: Medicare Other | Admitting: Internal Medicine

## 2015-01-26 ENCOUNTER — Encounter: Payer: Self-pay | Admitting: Internal Medicine

## 2015-01-26 ENCOUNTER — Other Ambulatory Visit (INDEPENDENT_AMBULATORY_CARE_PROVIDER_SITE_OTHER): Payer: Medicare Other

## 2015-01-26 VITALS — BP 118/72 | HR 108 | Temp 98.3°F | Wt 219.8 lb

## 2015-01-26 DIAGNOSIS — R635 Abnormal weight gain: Secondary | ICD-10-CM

## 2015-01-26 DIAGNOSIS — K2270 Barrett's esophagus without dysplasia: Secondary | ICD-10-CM | POA: Diagnosis not present

## 2015-01-26 DIAGNOSIS — K219 Gastro-esophageal reflux disease without esophagitis: Secondary | ICD-10-CM

## 2015-01-26 DIAGNOSIS — M5416 Radiculopathy, lumbar region: Secondary | ICD-10-CM

## 2015-01-26 LAB — CBC WITH DIFFERENTIAL/PLATELET
BASOS ABS: 0 10*3/uL (ref 0.0–0.1)
Basophils Relative: 0.5 % (ref 0.0–3.0)
Eosinophils Absolute: 0.3 10*3/uL (ref 0.0–0.7)
Eosinophils Relative: 4.7 % (ref 0.0–5.0)
HCT: 37 % (ref 36.0–46.0)
Hemoglobin: 12.7 g/dL (ref 12.0–15.0)
LYMPHS PCT: 33 % (ref 12.0–46.0)
Lymphs Abs: 2.2 10*3/uL (ref 0.7–4.0)
MCHC: 34.3 g/dL (ref 30.0–36.0)
MCV: 87.6 fl (ref 78.0–100.0)
MONO ABS: 0.8 10*3/uL (ref 0.1–1.0)
Monocytes Relative: 11.9 % (ref 3.0–12.0)
NEUTROS PCT: 49.9 % (ref 43.0–77.0)
Neutro Abs: 3.4 10*3/uL (ref 1.4–7.7)
Platelets: 264 10*3/uL (ref 150.0–400.0)
RBC: 4.23 Mil/uL (ref 3.87–5.11)
RDW: 12.9 % (ref 11.5–15.5)
WBC: 6.7 10*3/uL (ref 4.0–10.5)

## 2015-01-26 LAB — TSH: TSH: 1.85 u[IU]/mL (ref 0.35–4.50)

## 2015-01-26 MED ORDER — AMITRIPTYLINE HCL 10 MG PO TABS: 10.0000 mg | ORAL_TABLET | Freq: Every day | ORAL | Status: DC

## 2015-01-26 NOTE — Patient Instructions (Addendum)
Reflux of gastric acid may be asymptomatic as this may occur mainly during sleep.The triggers for reflux  include stress; the "aspirin family" ; alcohol; peppermint; and caffeine (coffee, tea, cola, and chocolate). The aspirin family would include aspirin and the nonsteroidal agents such as ibuprofen &  Naproxen. Tylenol would not cause reflux. If having symptoms ; food & drink should be avoided for @ least 2 hours before going to bed.     Your next office appointment will be determined based upon review of your pending labs.Those instructions will be transmitted to you by My Chart Critical results will be called. Followup as needed for any active or acute issue. Please report any significant change in your symptoms.  To prevent sleep dysfunction follow these instructions for sleep hygiene. Do not read, watch TV, or eat in bed. Do not get into bed until you are ready to turn off the light &  to go to sleep. Do not ingest stimulants ( decongestants, diet pills, nicotine, caffeine) after the evening meal.Do not take daytime naps.Cardiovascular exercise, this can be as simple a program as walking, is recommended 30-45 minutes 3-4 times per week. If you're not exercising you should take 6-8 weeks to build up to this level.

## 2015-01-26 NOTE — Assessment & Plan Note (Addendum)
She requests F/U with Dr Lavonia DraftsMann,GI CBC

## 2015-01-26 NOTE — Progress Notes (Signed)
Pre visit review using our clinic review tool, if applicable. No additional management support is needed unless otherwise documented below in the visit note. 

## 2015-01-26 NOTE — Assessment & Plan Note (Signed)
As per Dr Wert 

## 2015-01-26 NOTE — Assessment & Plan Note (Signed)
Trial of low-dose amitriptyline 10 mg at bedtime

## 2015-01-26 NOTE — Assessment & Plan Note (Signed)
CBC & dif  Anti reflux measures  Continue PPI in am & Pepcid qhs

## 2015-01-26 NOTE — Progress Notes (Signed)
   Subjective:    Patient ID: [name], female    DOB: 12-07-1950, 64 y.o.   MRN: [id number]  HPI The patient is here to assess status of active health conditions.  PMH, FH, & Social History reviewed & updated.  She is on a "reflux diet" avoiding processed foods and increasing fruits and other natural products in her diet. She does have occasional dysphagia but this is related to certain foods such as rice. She has no other active GI symptoms. She is due for colonoscopy.  Her sarcoid is at baseline. She uses albuterol as needed and Symbicort 1-2 puffs twice a day as needed. She continues to see Dr. Sherene SiresWert, pulmonologist. She has exertional dyspnea and is oxygen dependent on 2 L/minute 24 hours a day. She does increase this to  3-4 liters per minute if shortness of breath occurs with ambulation. She does describe increased mucus. ENT has recommended that she continue the Pepcid along with the PPI.  She has IBS manifested as need to defecate with urination occurring approximate once a month and lasting 1-2 days. She denies any other bowel changes such as loose stool.She has actually been gaining weight which is a concern to her.  She's had some lumbar radiculopathy symptoms in the right lower extremity. Gabapentin was stopped as it did cause edema. This occurred on 2 different trials. She denies incontinence of urine or stool; numbness; or tingling in lower extremities. She does have some limb weakness.  She has had hyperglycemia in the past; her last glucose was 88 in October 2015. Renal function was normal at that time. Her lipids have never been elevated.    Review of Systems   Chest pain, palpitations, tachycardia,  paroxysmal nocturnal dyspnea, claudication or edema are absent.  Unexplained weight loss, abdominal pain, significant dyspepsia, dysphagia, melena, rectal bleeding, or persistently small caliber stools are denied.  She has chronic insomnia and sleeps only 1-4 hours per  night.     Objective:   Physical Exam  Pertinent or positive findings include: Nasal oxygen is in place.  She has an upper partial.  She has an S4 gallop.  She has asymmetric rhonchi and rales without increased work of breathing. This is most striking in the left lower lobe. She has slight crepitus of the knees.  Pedal pulses are decreased particularly the posterior tibial pulses.  General appearance :adequately nourished; in no distress. Eyes: No conjunctival inflammation or scleral icterus is present. Oral exam:  Lips and gums are healthy appearing.There is no oropharyngeal erythema or exudate noted.  Heart:  Normal rate and regular rhythm. S1 and S2 normal without  murmur, click, or rub .   Abdomen: bowel sounds normal, soft and non-tender without masses, organomegaly or hernias noted.  No guarding or rebound.  Vascular : all pulses equal ; no bruits present. Skin:Warm & dry.  Intact without suspicious lesions or rashes ; no tenting  Lymphatic: No lymphadenopathy is noted about the head, neck, axilla Neuro: Strength, tone & DTRs normal.        Assessment & Plan:  See Current Assessment & Plan in Problem List under specific Diagnosis

## 2015-01-30 ENCOUNTER — Encounter (HOSPITAL_COMMUNITY): Payer: Medicare Other

## 2015-02-01 ENCOUNTER — Encounter (HOSPITAL_COMMUNITY): Payer: Medicare Other

## 2015-02-01 ENCOUNTER — Telehealth (HOSPITAL_COMMUNITY): Payer: Self-pay

## 2015-02-06 ENCOUNTER — Encounter (HOSPITAL_COMMUNITY): Payer: Medicare Other

## 2015-02-08 ENCOUNTER — Encounter (HOSPITAL_COMMUNITY): Payer: Medicare Other | Attending: Internal Medicine

## 2015-02-08 DIAGNOSIS — Z5189 Encounter for other specified aftercare: Secondary | ICD-10-CM | POA: Insufficient documentation

## 2015-02-08 DIAGNOSIS — D869 Sarcoidosis, unspecified: Secondary | ICD-10-CM | POA: Insufficient documentation

## 2015-02-12 ENCOUNTER — Other Ambulatory Visit: Payer: Self-pay | Admitting: Internal Medicine

## 2015-02-13 ENCOUNTER — Encounter (HOSPITAL_COMMUNITY): Payer: Self-pay

## 2015-02-14 ENCOUNTER — Telehealth: Payer: Self-pay

## 2015-02-14 NOTE — Telephone Encounter (Signed)
PA for amitriptyline started via cover my meds.

## 2015-02-15 ENCOUNTER — Encounter (HOSPITAL_COMMUNITY): Payer: Self-pay

## 2015-02-20 ENCOUNTER — Encounter (HOSPITAL_COMMUNITY): Payer: Self-pay

## 2015-02-22 ENCOUNTER — Encounter (HOSPITAL_COMMUNITY): Payer: Self-pay

## 2015-02-22 NOTE — Telephone Encounter (Signed)
PA approved. Pharmacy informed.

## 2015-02-27 ENCOUNTER — Encounter (HOSPITAL_COMMUNITY): Payer: Self-pay

## 2015-03-01 ENCOUNTER — Encounter (HOSPITAL_COMMUNITY): Payer: Self-pay

## 2015-03-06 ENCOUNTER — Encounter (HOSPITAL_COMMUNITY): Payer: Self-pay

## 2015-03-08 ENCOUNTER — Encounter (HOSPITAL_COMMUNITY): Payer: Self-pay

## 2015-03-13 ENCOUNTER — Encounter (HOSPITAL_COMMUNITY): Payer: Self-pay

## 2015-03-15 ENCOUNTER — Encounter (HOSPITAL_COMMUNITY): Payer: Self-pay

## 2015-03-20 ENCOUNTER — Telehealth: Payer: Self-pay | Admitting: Internal Medicine

## 2015-03-20 ENCOUNTER — Encounter (HOSPITAL_COMMUNITY): Payer: Self-pay

## 2015-03-20 NOTE — Telephone Encounter (Signed)
Referral for GI is not in, please advise.

## 2015-03-20 NOTE — Telephone Encounter (Signed)
Patient stated she asked Dr. Alwyn RenHopper to recommend a GI doctor and he referred her to a Dr. Loreta AveMann so she called them and they need a referral from Dr. Alwyn RenHopper

## 2015-03-21 NOTE — Telephone Encounter (Signed)
Pt is needed as referral for a colonoscopy to Dr Kenna GilbertMann's office. Please let me know when you send this in so i can fax over office visit notes. Thanks

## 2015-03-21 NOTE — Telephone Encounter (Signed)
Unfortunately she will need to see Dr Sherene SiresWert before referral to verify there is no respiratory risk to having colonoscopy as she would be given conscious sedation in OP setting

## 2015-03-21 NOTE — Telephone Encounter (Signed)
She doesn't need GI evaluation until late next year based on my exam 01/26/15 & review of upper  Endo in 2/15

## 2015-03-21 NOTE — Telephone Encounter (Signed)
Needs ov with all meds in hand

## 2015-03-21 NOTE — Telephone Encounter (Signed)
Informed pt of Dr Caryl NeverHoppers message

## 2015-03-22 ENCOUNTER — Encounter (HOSPITAL_COMMUNITY): Payer: Self-pay

## 2015-03-27 ENCOUNTER — Encounter (HOSPITAL_COMMUNITY): Payer: Self-pay

## 2015-03-29 ENCOUNTER — Encounter (HOSPITAL_COMMUNITY): Payer: Self-pay

## 2015-03-30 ENCOUNTER — Ambulatory Visit: Payer: Medicare Other | Admitting: Internal Medicine

## 2015-04-03 ENCOUNTER — Encounter (HOSPITAL_COMMUNITY): Payer: Self-pay

## 2015-04-04 ENCOUNTER — Ambulatory Visit (INDEPENDENT_AMBULATORY_CARE_PROVIDER_SITE_OTHER): Payer: Medicare Other | Admitting: Internal Medicine

## 2015-04-04 ENCOUNTER — Encounter: Payer: Self-pay | Admitting: Internal Medicine

## 2015-04-04 ENCOUNTER — Ambulatory Visit (INDEPENDENT_AMBULATORY_CARE_PROVIDER_SITE_OTHER)
Admission: RE | Admit: 2015-04-04 | Discharge: 2015-04-04 | Disposition: A | Discharge status: Home / Self Care | Payer: Medicare Other | Source: Ambulatory Visit | Attending: Internal Medicine | Admitting: Internal Medicine

## 2015-04-04 VITALS — BP 134/86 | HR 113 | Ht 65.0 in | Wt 216.0 lb

## 2015-04-04 DIAGNOSIS — J4530 Mild persistent asthma, uncomplicated: Secondary | ICD-10-CM

## 2015-04-04 DIAGNOSIS — J9611 Chronic respiratory failure with hypoxia: Secondary | ICD-10-CM | POA: Diagnosis not present

## 2015-04-04 DIAGNOSIS — D869 Sarcoidosis, unspecified: Secondary | ICD-10-CM | POA: Diagnosis not present

## 2015-04-04 NOTE — Progress Notes (Signed)
Quick Note:  Spoke with pt and notified of results per Dr. Wert. Pt verbalized understanding and denied any questions.  ______ 

## 2015-04-04 NOTE — Patient Instructions (Addendum)
Please see patient coordinator before you leave today  to schedule ambulatory 02 titration to see if qualifies for POC   Please remember to go to the x-ray department downstairs for your tests - we will call you with the results when they are available.  You are cleared for colonsocopy but 5 days before   start symbicort Take 2 puffs first thing in am and then another 2 puffs about 12 hours later.

## 2015-04-04 NOTE — Progress Notes (Signed)
Subjective:    Patient ID: [name], female    DOB: Mar 13, 1951 MRN: [id number]    Brief patient profile:  [age] yobf quit smoking in 1980 and dx with sarcoidosis in 1996 but free of evidence of active sarcoid off steroids when last seen in pulmonary clinic in 04/2010 with predominantly upper airway cough    07/11/2013 ov/ [name]  Chief Complaint  Patient presents with  . Pulmonary Consult    Pt last seen here in 2009- referred back per Dr. Frederik Day's request. Pt c/o cough for 2 yrs and SOB on and off for the past 6 months.  Cough is occ prod with very minimal yellow sputum.  She states that she gets out of breath walking minimal distances such as from room to room at home and from parking lot to building today.   always better p prednisone but 2 weeks later  symptoms resume, last time was Feb 2014 and gradual onset progressive symptoms since then of  Cough = sob, better p saba,  Cough seems worse p supper, does not typically wake her up.  Only using rescue once or twice weekly and says symbicort works when she takes it.  Has h/o GERD / Barrett's denies being told to stay on ppi rec Prednisone 10 mg take  4 each am x 2 days,   2 each am x 2 days,  1 each am x 2 days and stop  Symbicort 160 Take 2 puffs first thing in am and then another 2 puffs about 12 hours later.  Work on inhaler technique:   Only use your albuterol (ventolin) as rescue GERD   Please remember to go to the  x-ray department downstairs for your tests - we will call you with the results when they are available. Please schedule a follow up office visit in 6 weeks, call sooner if needed with pfts  add :  Need to sort out ? Of Barrett's and whether she needs lifetime ppi/ gi     Admit date: 03/07/2014  Discharge date: 03/09/2014  Discharge Diagnoses:  Acute on chronic hypoxemic respiratory failure  Baseline stage 4 sarcoid  Chronic cough  GERD  DISCHARGE PLAN BY DIAGNOSIS  Acute on chronic hypoxemic respiratory failure   Baseline stage 4 sarcoid  Chronic cough  Discharge Plan:  - Supplemental home O2.  - Prednisone taper.  - Continue 7 days of doxycycline.  - Tramadol 50mg  q6h.  - Respiratory virus panel results pending.  - F/u appt with Pulmonary as scheduled below.  GERD  Discharge Plan:  - Continue PPI (over the counter).  DISCHARGE SUMMARY  [name] is a 64 y.o. y/o AA female with a PMH of Pulmonary Sarcoidosis, Asthma, GERD, Barrett's, Hiatal hernia, Migraines, IBS, Anxiety. She was seen by her PCP Kiara Ren(Hopper) on 6/30 for cough, fever, tachycardia, SOB x 10 days. In office, SpO2 82%. PCP sent her to ED for further evaluation.  In ED, CT angio was obtained and was negative for PE and demonstrated findings c/w sarcoidosis. She was treated with nebulizers, put on supplemental O2, and [facility]  She was started on IV steroids and given empiric abx (doxycycline).  On 7/2, her symptoms were markedly improved and she was deemed safe for discharge home. She is scheduled for a follow up outpatient visit with pulmonary as noted below.  SIGNIFICANT DIAGNOSTIC STUDIES / EVENTS  6/30 admit  6/30 CTA >>> neg for PE , cystic lucencies/fibrosis in a perilymphatic/peribronchovascular distribution with subpleural reticulation/fibrosis,  related to patient's known sarcoidosis.  MICRO DATA  Blood 6/30 >>> ngtd  Urine 6/30 >>> ? contaminant  Autoimmune Panel 6/30 >>> RF high, ANA neg, CCP and HP neg ANTIBIOTICS  Doxycycline 6/30 >>> (will be discharged with 7 days outpatient therapy).      05/16/2014 f/u ov/[name] re: burned out sarcoid/ 02 dep resp failure 24/7 02  Chief Complaint  Patient presents with  . Followup with PFT    Pt states that hoarseness is slightly better. She c/o minimal swelling in her rt foot.    not really limited from desired acitvities on 02 but notes desats with exertion on 2lpm rec No change rx Pt stopped prednisone ? Date   06/28/2014 f/u ov/[name] re: 02 dep resp failure/  sarcoid off pred since ? But no worse off  Chief Complaint  Patient presents with  . Follow-up    Breathing is unchaged since the last visit.  No new co's today. She has used rescue inhaler x 2 this past wk.   doing well with rehab though needing more 02 with the heaviest of exertion than the 2lpm we prev recommended.  No more sob on 02 at baseline activity, though. >DME order for new portable O2 .   07/13/2014 Follow and Med Review 02 dep resp failure/ sarcoid  Patient returns for a two-week follow-up and medication review. Says overall she is doing okay with her breathing .  Reviewed all her medications organize them into a medication calendar. Patient education and thought appears that she is taking her medications correctly. Says she is doing well with pulmonary rehab, really loves the program.  Is being evaluated by DME for new portable O2 device.  She denies any chest pain, orthopnea, PND, or increased leg swelling rec No change rx/ follow med calendar    09/20/2014 acute ov/[name] re: ?uri/ ? asthma exac/not following med calendar action plans  "I have my meds on my cell phone" Chief Complaint  Patient presents with  . Follow-up    pt c/o sob with lots of exertion, prod cough X1 week with green mucus.  is requesting abx and prednisone. Needs nasula cannula with "mustache" according to pulmonary rehab.   prior to acute onset cough was not using any inhalers at all and was supposed to start back on symbicort 160 immediately for any flare as per action plan but hasn't started as yet despite back on ventolin with severe sob over baseline mostly just while coughing but ok with adls and even still doing rehab despite flare of cough and sob   rec zpak Prednisone 10 mg take  4 each am x 2 days,   2 each am x 2 days,  1 each am x 2 days and stop     04/04/2015 f/u ov/[name] re: sarcoid/ 02 dep 2lpm  Chief Complaint  Patient presents with  . Follow-up    Pt needing pulmonary clearance for  colonoscopy. She states her breathing is doing well and denies any new co's today. She is using Symbicort 2 x per wk on average.     Not limited by breathing from desired activities  But not very active  No obvious day to day or daytime variabilty or assoc  Cough or cp or chest tightness, subjective wheeze overt sinus or hb symptoms. No unusual exp hx or h/o childhood pna/ asthma or knowledge of premature birth.  Sleeping ok without nocturnal  or early am exacerbation  of respiratory  c/o's or need for  noct saba. Also denies any obvious fluctuation of symptoms with weather or environmental changes or other aggravating or alleviating factors except as outlined above   Current Medications, Allergies, Complete Past Medical History, Past Surgical History, Family History, and Social History were reviewed in [employer] record.  ROS  The following are not active complaints unless bolded sore throat, dysphagia, dental problems, itching, sneezing,  nasal congestion or excess/ purulent secretions, ear ache,   fever, chills, sweats, unintended wt loss, pleuritic or exertional cp, hemoptysis,  orthopnea pnd or leg swelling, presyncope, palpitations, heartburn, abdominal pain, anorexia, nausea, vomiting, diarrhea  or change in bowel or urinary habits, change in stools or urine, dysuria,hematuria,  rash, arthralgias, visual complaints, headache, numbness weakness or ataxia or problems with walking or coordination,  change in mood/affect or memory.         .      Objective:   Physical Exam  amb bf nad/ mild pseudowheeze = baseline     08/22/2013     204 > 03/20/2014   214  >214 04/03/2014 > 04/11/2014  220 > 227 04/26/2014 > 05/16/2014  227> 06/28/2014  228 >228 07/13/2014 > 09/20/2014  213 > 04/04/2015 216   HEENT: nl dentition, turbinates, and orophanx. Nl external ear canals without cough reflex   NECK :  without JVD/Nodes/TM/ nl carotid upstrokes bilaterally   LUNGS: no acc muscle  use, clear bilaterally to A and P    CV:  RRR  no s3 or murmur or increase in P2, no edema   ABD:  soft and nontender with nl excursion in the supine position. No bruits or organomegaly, bowel sounds nl  MS:  warm without deformities, calf tenderness, cyanosis or clubbing  SKIN: warm and dry without lesions         CXR PA and Lateral:   04/04/2015 :     I personally reviewed images and agree with radiology impression as follows:    Stable chronically increased interstitial markings consistent with known sarcoidosis. Stable bilateral hilar lymphadenopathy. No acute cardiopulmonary abnormality.       Assessment & Plan:

## 2015-04-05 ENCOUNTER — Encounter (HOSPITAL_COMMUNITY): Payer: Self-pay

## 2015-04-09 ENCOUNTER — Encounter: Payer: Self-pay | Admitting: Internal Medicine

## 2015-04-09 NOTE — Assessment & Plan Note (Signed)
Onset: late teens Triggers (environmental, infectious, allergic): foods, odors Rescue inhaler HKU:VJDYNX Maintenance medications/ response: Symbicort twice a day Smoking history:quit 1973 Family history pulmonary disease: adopted  - PFT's 08/22/2013  FEV1  1.96 (103%); ratio 83 and no change p B2 and nl FEF 25/75 p am symbicort, dLco 53 corrects to 72%> rec trial off symbicort > no change symptoms x months then flared 02/2014 and started back on pred daily    - 05/16/14 pFT's s airflow obst  - 09/20/2014 p extensive coaching HFA effectiveness =    75%   All goals of chronic asthma control met including optimal function and elimination of symptoms with minimal need for rescue therapy s any regular maint rx  Contingencies discussed in full including contacting this office immediately if not controlling the symptoms using the rule of two's.     Cleared for colonoscopy but needs to resume full dose symbicort  5 days prior to procedure  I had an extended discussion with the patient reviewing all relevant studies completed to date and  lasting 15 to 20 minutes of a 25 minute visit    Each maintenance medication was reviewed in detail including most importantly the difference between maintenance and prns and under what circumstances the prns are to be triggered using an action plan format that is not reflected in the computer generated alphabetically organized AVS.    Please see instructions for details which were reviewed in writing and the patient given a copy highlighting the part that I personally wrote and discussed at today's ov.

## 2015-04-09 NOTE — Assessment & Plan Note (Signed)
Pfts 08/22/2013 nl lung vols,  dlco 53 corrects to 73% - PFTs 05/16/2014  Nl lung vols,  DLCO 42% corrects to 67%  - prednisone daily restarted 03/08/14  > stopped by pt around 9/15 /15  No evidence active dz or need for stress steroids at this point or perioperatively

## 2015-04-09 NOTE — Assessment & Plan Note (Addendum)
-   placed on 02 at [facility] from [facility] 03/08/14 >   rx 2lpm 24/7  - 05/16/2014   Walked 2lpm x one lap @ 185 stopped due to  desat to 88% at end with sob resolved on 3lpm - 05/17/2014 referred to rehab - 06/20/14  walked 674 feet with 2 breaks. The patient's lowest oxygen saturation was 84% , highest heart rate was 124 , and highest blood pressure was 124/80. The patient was on 6 liters of oxygen with a nasal cannula. Pt stated that fatigue hindered their walk test.   Presumably related to parenchymal scarring  rx as of  04/04/15   2lpm except 3lpm with any  activity > room to room  and titrate to > 90% with exertion

## 2015-04-10 ENCOUNTER — Encounter (HOSPITAL_COMMUNITY): Payer: Self-pay

## 2015-04-12 ENCOUNTER — Encounter (HOSPITAL_COMMUNITY): Payer: Self-pay

## 2015-04-17 ENCOUNTER — Encounter (HOSPITAL_COMMUNITY): Payer: Self-pay

## 2015-04-18 ENCOUNTER — Encounter: Payer: Self-pay | Admitting: Internal Medicine

## 2015-04-19 ENCOUNTER — Encounter (HOSPITAL_COMMUNITY): Payer: Self-pay

## 2015-04-24 ENCOUNTER — Encounter (HOSPITAL_COMMUNITY): Payer: Self-pay

## 2015-04-26 ENCOUNTER — Encounter (HOSPITAL_COMMUNITY): Payer: Self-pay

## 2015-04-27 ENCOUNTER — Encounter: Payer: Self-pay | Admitting: Gastroenterology

## 2015-05-01 ENCOUNTER — Encounter (HOSPITAL_COMMUNITY): Payer: Self-pay

## 2015-05-03 ENCOUNTER — Encounter (HOSPITAL_COMMUNITY): Payer: Self-pay

## 2015-05-08 ENCOUNTER — Encounter (HOSPITAL_COMMUNITY): Payer: Self-pay

## 2015-05-10 ENCOUNTER — Encounter (HOSPITAL_COMMUNITY): Payer: Self-pay

## 2015-05-15 ENCOUNTER — Encounter (HOSPITAL_COMMUNITY): Payer: Self-pay

## 2015-05-17 ENCOUNTER — Encounter (HOSPITAL_COMMUNITY): Payer: Self-pay

## 2015-05-17 ENCOUNTER — Other Ambulatory Visit: Payer: Self-pay | Admitting: Internal Medicine

## 2015-05-22 ENCOUNTER — Encounter (HOSPITAL_COMMUNITY): Payer: Self-pay

## 2015-05-24 ENCOUNTER — Encounter (HOSPITAL_COMMUNITY): Payer: Self-pay

## 2015-05-29 ENCOUNTER — Encounter (HOSPITAL_COMMUNITY): Payer: Self-pay

## 2015-05-31 ENCOUNTER — Encounter (HOSPITAL_COMMUNITY): Payer: Self-pay

## 2015-06-05 ENCOUNTER — Encounter (HOSPITAL_COMMUNITY): Payer: Self-pay

## 2015-06-07 ENCOUNTER — Encounter (HOSPITAL_COMMUNITY): Payer: Self-pay

## 2015-06-21 ENCOUNTER — Ambulatory Visit: Payer: Medicare Other | Admitting: Gastroenterology

## 2015-08-15 ENCOUNTER — Ambulatory Visit: Payer: Medicare Other | Admitting: Gastroenterology

## 2015-08-24 ENCOUNTER — Ambulatory Visit: Payer: Medicare Other | Admitting: Gastroenterology

## 2015-08-25 ENCOUNTER — Other Ambulatory Visit: Payer: Self-pay | Admitting: Internal Medicine

## 2015-08-29 ENCOUNTER — Other Ambulatory Visit: Payer: Self-pay | Admitting: Emergency Medicine

## 2015-08-29 DIAGNOSIS — M5416 Radiculopathy, lumbar region: Secondary | ICD-10-CM

## 2015-08-29 MED ORDER — AMITRIPTYLINE HCL 10 MG PO TABS: 10.0000 mg | ORAL_TABLET | Freq: Every day | ORAL | Status: DC

## 2015-10-18 ENCOUNTER — Ambulatory Visit: Payer: Medicare Other | Admitting: Gastroenterology

## 2015-10-31 ENCOUNTER — Telehealth: Payer: Self-pay | Admitting: Internal Medicine

## 2015-10-31 MED ORDER — BUDESONIDE-FORMOTEROL FUMARATE 160-4.5 MCG/ACT IN AERO: 2.0000 | INHALATION_SPRAY | Freq: Two times a day (BID) | RESPIRATORY_TRACT | Status: DC | PRN

## 2015-10-31 NOTE — Telephone Encounter (Signed)
Called spoke with pt. She [facility] I have done so. nothing further needed

## 2015-12-07 ENCOUNTER — Encounter: Payer: Self-pay | Admitting: Gastroenterology

## 2015-12-07 ENCOUNTER — Ambulatory Visit (INDEPENDENT_AMBULATORY_CARE_PROVIDER_SITE_OTHER): Payer: Medicare Other | Admitting: Gastroenterology

## 2015-12-07 VITALS — BP 104/72 | HR 82 | Ht 64.5 in | Wt 219.0 lb

## 2015-12-07 DIAGNOSIS — R1310 Dysphagia, unspecified: Secondary | ICD-10-CM | POA: Diagnosis not present

## 2015-12-07 DIAGNOSIS — Z1211 Encounter for screening for malignant neoplasm of colon: Secondary | ICD-10-CM

## 2015-12-07 DIAGNOSIS — K219 Gastro-esophageal reflux disease without esophagitis: Secondary | ICD-10-CM | POA: Diagnosis not present

## 2015-12-07 MED ORDER — RANITIDINE HCL 150 MG PO TABS: 150.0000 mg | ORAL_TABLET | Freq: Two times a day (BID) | ORAL | Status: DC

## 2015-12-07 MED ORDER — PANTOPRAZOLE SODIUM 20 MG PO TBEC: 20.0000 mg | DELAYED_RELEASE_TABLET | Freq: Every day | ORAL | Status: DC

## 2015-12-07 NOTE — Progress Notes (Signed)
HPI :  65 y/o female seen in consultation for Dr. Marga MelnickWilliam Hopper, here to discuss CRC screening. She has a history of asthma and pulmonary sarcoidosis, on supplemental oxygen. She also has a reported history of Barrett's  She has a BM once daily. She denies constipation at baseline, she has rare loose stools. No blood in the stools. No chronic abdominal pains. No weight loss. No new changes in her bowels which bother her at this time. Her last colonoscopy was in 2006 which was normal. She is adopted and has no known FH of colon cancer.   She has pulmonary sarcoidosis and asthma, and is on supplemental oxygen. She has a chronic cough and can bring up thick mucous at times. She also has a history of GERD and a remote history of Barrett's on EGD in 2009. She has some regurgitation in regards to her reflux symptoms, but denies pyrosis. She has some dysphagia which is longstanding. She reports rare solid food dysphagia but not significant. No dysphagia to liquids. She will feel this in the mid sternum when food can get caught up. She endorses a history of hiatal hernia with Nissen repair several years ago. She takes protonix once daily at this time. She takes zantac at night to help if she has breakthrough. At present time her reflux appears reasonably well controlled. If she alters he eating schedule she can have some breakthrough. For the most part her symptoms are stable.   Her endoscopic history is as outlined below. Short segment < 1cm of nondysplastic Barrett's in 2009, she has not had Barrett's noted on EGD in 2011 and 2015 since that time. She has had multiple dilations of the GEJ empirically for her symptoms of dysphagia over time, which has persisted, and a prior barium study in 2015 which was unremarkable for any stenosis or dysmotility.  Endoscopic history: Colonoscopy 2006 - normal EGD 01/10/2008 - Nissen, tongue of BE, dilated to 18mm - biopsies c/w BE, no dysplasia EGD 03/13/10 - Nissen,  irregular z-line - biopsies without BE EGD 10/18/13 - Nissen, irregular z-line, dilated with 52 Fr Maloney, Bravo placed - Demeester score normal - biopsies without BE    Past Medical History  Diagnosis Date  . GERD (gastroesophageal reflux disease)   . Barrett esophagus   . Pulmonary sarcoidosis (HCC)   . Hiatal hernia   . Asthma   . Migraines   . IBS (irritable bowel syndrome)   . Anxiety      Past Surgical History  Procedure Laterality Date  . Cholecystectomy    . Rotator cuff repair      x3(2 on L, 1 on R)  . Cataract extraction, bilateral    . Bronchial biopsy  1996  . Nissen fundoplication    . Bravo ph study N/A 10/10/2013    Procedure: BRAVO PH STUDY;  Surgeon: Hart Carwinora M Brodie, MD;  Location: [facility];  Service: Endoscopy;  Laterality: N/A;  placed 29  . Esophagogastroduodenoscopy N/A 10/10/2013    Procedure: ESOPHAGOGASTRODUODENOSCOPY (EGD);  Surgeon: Hart Carwinora M Brodie, MD;  [facility];  Service: Endoscopy;  Laterality: N/A;  pt asthmatic, RA 92% at rest.  Coughing frequently. Wheezes noted in upper lung fields at auscultation. Pt instructed to use home inhaler prior to procedure and placed on supportive O2 @ 2 lpm in pre-procedural. Teaching done ie. use of inhaler. Pt states she ju   Family History  Problem Relation Age of Onset  . Adopted: Yes   Social History  Substance Use Topics  . Smoking status: Former Smoker -- 1.00 packs/day for 20 years    Types: Cigarettes    Quit date: 09/08/1978  . Smokeless tobacco: Never Used  . Alcohol Use: No   Current Outpatient Prescriptions  Medication Sig Dispense Refill  . amitriptyline (ELAVIL) 10 MG tablet Take 1 tablet (10 mg total) by mouth at bedtime. ---- Please establish with new PCP for further refills 30 tablet 0  . Ascorbic Acid (VITAMIN C) 1000 MG tablet Take 1,000 mg by mouth every morning.     . ASTRAGALUS PO Take 1 tablet by mouth every morning.     . Biotin 1 MG CAPS Take 1 capsule by mouth daily.      . budesonide-formoterol (SYMBICORT) 160-4.5 MCG/ACT inhaler Inhale 2 puffs into the lungs 2 (two) times daily as needed. 1 Inhaler 3  . cycloSPORINE (RESTASIS) 0.05 % ophthalmic emulsion Place 1 drop into both eyes 2 (two) times daily.     . famotidine (PEPCID) 20 MG tablet Take 1 tablet by mouth at bedtime.  2  . fluticasone (FLONASE) 50 MCG/ACT nasal spray Place 2 sprays into both nostrils every morning. 16 g 11  . furosemide (LASIX) 20 MG tablet Take 20 mg by mouth daily.    Marland Kitchen ipratropium (ATROVENT) 0.06 % nasal spray Place 2 sprays into both nostrils 2 (two) times daily.    Marland Kitchen LORazepam (ATIVAN) 0.5 MG tablet Take 1/2- 1 tablet by mouth every 8-12 hours prn only 30 tablet 0  . Multiple Vitamin (MULTIVITAMIN) capsule Take 1 capsule by mouth every morning.     . Omega-3 1400 MG CAPS Take 2 capsules by mouth daily.    . pantoprazole (PROTONIX) 40 MG tablet TAKE 1 TABLET (40 MG TOTAL) BY MOUTH DAILY. TAKE 30-60 MIN BEFORE FIRST MEAL OF THE DAY 30 tablet 2  . POTASSIUM PO Take 1 tablet by mouth daily.    . vitamin B-12 (CYANOCOBALAMIN) 1000 MCG tablet Take 5,000 mcg by mouth daily.     No current facility-administered medications for this visit.   Allergies  Allergen Reactions  . Gabapentin     Edema on 2 separate trials  . Penicillins Other (See Comments)    Unknown - childhood allergy  . Aspirin Other (See Comments)    gastritis     Review of Systems: All systems reviewed and negative except where noted in HPI.   Lab Results  Component Value Date   WBC 6.7 01/26/2015   HGB 12.7 01/26/2015   HCT 37.0 01/26/2015   MCV 87.6 01/26/2015   PLT 264.0 01/26/2015    Lab Results  Component Value Date   CREATININE 1.1 06/15/2014   BUN 15 06/15/2014   NA 138 06/15/2014   K 3.9 06/15/2014   CL 103 06/15/2014   CO2 32 06/15/2014      Physical Exam: BP 104/72 mmHg  Pulse 82  Ht 5' 4.5" (1.638 m)  Wt 219 lb (99.338 kg)  BMI 37.02 kg/m2 Constitutional: Pleasant, female in no  acute distress. Wearing supplemental oxygen HEENT: Normocephalic and atraumatic. Conjunctivae are normal. No scleral icterus. Neck supple.  Cardiovascular: Normal rate, regular rhythm.  Pulmonary/chest: come coarse scattered BS B, mildly decreased Abdominal: Soft, protuberant, nontender. Bowel sounds active throughout. There are no masses palpable. No hepatomegaly. Extremities: no edema Lymphadenopathy: No cervical adenopathy noted. Neurological: Alert and oriented to person place and time. Skin: Skin is warm and dry. No rashes noted. Psychiatric: Normal mood and affect. Behavior is normal.  ASSESSMENT AND PLAN: 65 y/o female with a history of asthma / pulmonary sarcoidosis on supplemental oxygen, here for history of GERD and CRC screening as discussed below.  GERD / history of Barrett's / Dysphagia - I reviewed her prior EGDs. She had a < 1cm segment of irregular z-line biopsied in 2009 c/w nondysplastic Barrett's, and EGDs since that time with biopsies have not shown Barrett's. I discussed Barrett's with her in general. Her risk of developing esophageal cancer from this lesion is extremely low. Current guidelines no longer recommend biopsies of irregular z-line given the risk of Barrett's progression in a segment like this is so low. If she really wanted to pursue additional surveillance, which I don't think is warranted, at earliest I would do it 5 years from her last exam per ACG guidelines. I reassured her I don't think she needs an EGD for this issue. I otherwise discussed the long term risks of PPI use with her, and the benefits. The risks of long term PPIs with current data include increased risk for chronic kidney disease, increased risk of fracture, increased risk of C diff, increased risk of pneumonia (short term usage), potentially increased risk of B12 / calcium deficiency, and rare risk of hypomagnesemia. Recent studies have also shown an association with increased risk of dementia and  cardiovascular outcomes including stroke. These studies have showed an association between PPIs and several of these outcomes but no evidence of causality. The patient was counseled to use the lowest daily use of PPI needed to control symptoms or use an alternative regimen. She will decrease her protonix to  daily and increase zantac to  BID and see how she does with this. If she has worsening she can increase protonix. In regards to her dysphagia she has had multiple dilations in the past without lasting relief. Barium swallow in 2015 was unremarkable. I suspect her symptoms are from her prior Nissen, and further dilation could lead to worsening reflux symptoms. Given her dysphagia is not severe and is intermittent, she will monitor for now and f/u PRN for this issue.   CRC screening - in light of comorbidities I discussed all options for screening. She has no new symptoms or anemia. Following a discussion of stool testing vs. Optical colonoscopy,  will proceed with Cologuard. If the Cologuard is positive, then she would proceed with colonoscopy. We will let her know the results.   Ileene Patrick, MD [facility] Pager [phone]  CC: Pecola Lawless, MD

## 2015-12-07 NOTE — Patient Instructions (Addendum)
We have sent your demographic and insurance information to [facility]. They should contact you within the next week regarding your Cologuard (colon cancer screening) test. If you have not heard from them within the next week, please call our office at [phone].  We will send Zantac to your pharmacy Decrease Protonix to 20 mg daily

## 2015-12-28 ENCOUNTER — Other Ambulatory Visit: Payer: Self-pay

## 2015-12-28 LAB — COLOGUARD: Cologuard: NEGATIVE

## 2015-12-31 ENCOUNTER — Encounter: Payer: Self-pay | Admitting: Gastroenterology

## 2016-01-16 ENCOUNTER — Ambulatory Visit: Payer: Medicare Other | Admitting: Family Medicine

## 2016-01-23 ENCOUNTER — Ambulatory Visit: Payer: Medicare Other

## 2016-02-07 ENCOUNTER — Encounter: Payer: Self-pay | Admitting: Family Medicine

## 2016-02-07 ENCOUNTER — Ambulatory Visit (INDEPENDENT_AMBULATORY_CARE_PROVIDER_SITE_OTHER): Payer: Medicare Other | Admitting: Family Medicine

## 2016-02-07 VITALS — BP 114/67 | HR 96 | Temp 98.4°F | Ht 65.0 in | Wt 220.2 lb

## 2016-02-07 DIAGNOSIS — Z5181 Encounter for therapeutic drug level monitoring: Secondary | ICD-10-CM

## 2016-02-07 DIAGNOSIS — F411 Generalized anxiety disorder: Secondary | ICD-10-CM | POA: Diagnosis not present

## 2016-02-07 DIAGNOSIS — M5416 Radiculopathy, lumbar region: Secondary | ICD-10-CM

## 2016-02-07 DIAGNOSIS — R600 Localized edema: Secondary | ICD-10-CM

## 2016-02-07 DIAGNOSIS — R29898 Other symptoms and signs involving the musculoskeletal system: Secondary | ICD-10-CM

## 2016-02-07 DIAGNOSIS — E2839 Other primary ovarian failure: Secondary | ICD-10-CM | POA: Diagnosis not present

## 2016-02-07 MED ORDER — AMITRIPTYLINE HCL 10 MG PO TABS: 10.0000 mg | ORAL_TABLET | Freq: Every day | ORAL | Status: DC

## 2016-02-07 MED ORDER — LORAZEPAM 0.5 MG PO TABS: ORAL_TABLET | ORAL | Status: DC

## 2016-02-07 MED ORDER — FUROSEMIDE 20 MG PO TABS: 20.0000 mg | ORAL_TABLET | Freq: Every day | ORAL | Status: DC

## 2016-02-07 NOTE — Patient Instructions (Signed)
It was very nice to meet you today We will do some basic labs for you- I'll be in touch with your results asap We will refer you for a bone density scan and also to do physical therapy to work on general body strength. I hope this will help with your back pains!    Use the furosemide as needed for swelling

## 2016-02-07 NOTE — Progress Notes (Signed)
Pre visit review using our clinic review tool, if applicable. No additional management support is needed unless otherwise documented below in the visit note. 

## 2016-02-07 NOTE — Progress Notes (Signed)
[facility] [address] [place], [place] [address] [phone]  Date:  02/07/2016   Name:  [name]   DOB:  07-16-1951   MRN:  [id number]  PCP:  Abbe Amsterdam, MD    Chief Complaint: Establish Care   History of Present Illness:  [name] is a 65 y.o. very pleasant female patient who presents with the following:  Former pt of Dr. Alwyn Ren who has now retired.   She is also a pt of Dr. Sherene Sires for her pulmonary concerns.  She has pulm sarcoidosis.  She is on oxygen via [place]- 2 or 3L depending on her activity level.  She is on this 24 hours a day- has done so for 2 years now.   She also has a lot of issues with GERD and sticks to a low acid diet- this does help  She uses lasix prn for pedal edema but has run out.  She has been out of it for a few months and is using antoher type of OTC water pill now- some herbal formula.  However she notes that this is not working as well to control her edema No known history of CHF  She also takes elavil at bedtime.  Needs a refill of this She takes lorazepam as needed for panic attacks. Does not use it very frequently but does need a refill  She did cologuard testing recently and it was negative- she is seeing Dr. Adela Lank since Dr. Juanda Chance has retired   She also notes that sometimes if she stands for an extended period she will feel pains and a shock like feeling though her bilateral upper back, and her knees will hurt Admits that she is not very physically active now that her breathing is so bad  Wt Readings from Last 3 Encounters:  02/07/16 220 lb 3.2 oz (99.882 kg)  12/07/15 219 lb (99.338 kg)  04/04/15 216 lb (97.977 kg)     Patient Active Problem List   Diagnosis Date Noted  . Lumbar radiculopathy 01/26/2015  . Chronic respiratory failure with hypoxia (HCC) 04/12/2014  . Obesity (BMI 30-39.9) 02/08/2014  . Allergic rhinitis 09/05/2010  . IBS 09/05/2010  .  GERD 02/18/2010  . Barrett's esophagus 02/12/2010  . Upper airway cough syndrome 11/02/2009  . Anxiety state 08/29/2008  . History of colonic polyps 08/29/2008  . PULMONARY SARCOIDOSIS 08/12/2007  . Mild persistent asthma in adult without complication with component of vcd 08/12/2007  . Diaphragmatic hernia 08/12/2007  . Migraine 08/12/2007    Past Medical History  Diagnosis Date  . GERD (gastroesophageal reflux disease)   . Barrett esophagus   . Pulmonary sarcoidosis (HCC)   . Hiatal hernia   . Asthma   . Migraines   . IBS (irritable bowel syndrome)   . Anxiety     Past Surgical History  Procedure Laterality Date  . Cholecystectomy    . Rotator cuff repair      x3(2 on L, 1 on R)  . Cataract extraction, bilateral    . Bronchial biopsy  1996  . Nissen fundoplication    . Bravo ph study N/A 10/10/2013    Procedure: BRAVO PH STUDY;  Surgeon: Hart Carwin, MD;  Location: [facility];  Service: Endoscopy;  Laterality: N/A;  placed 29  . Esophagogastroduodenoscopy N/A 10/10/2013    Procedure: ESOPHAGOGASTRODUODENOSCOPY (EGD);  Surgeon: Hart Carwin, MD;  Location[facility];  Service: Endoscopy;  Laterality: N/A;  pt asthmatic, RA 92% at rest.  Coughing frequently. Wheezes noted in upper lung fields at auscultation. Pt instructed to use home inhaler prior to procedure and placed on supportive O2 @ 2 lpm in pre-procedural. Teaching done ie. use of inhaler. Pt states she ju    Social History  Substance Use Topics  . Smoking status: Former Smoker -- 1.00 packs/day for 20 years    Types: Cigarettes    Quit date: 09/08/1978  . Smokeless tobacco: Never Used  . Alcohol Use: No    Family History  Problem Relation Age of Onset  . Adopted: Yes    Allergies  Allergen Reactions  . Gabapentin     Edema on 2 separate trials  . Penicillins Other (See Comments)    Unknown - childhood allergy  . Aspirin Other (See Comments)    gastritis    Medication list has been reviewed  and updated.  Current Outpatient Prescriptions on File Prior to Visit  Medication Sig Dispense Refill  . amitriptyline (ELAVIL) 10 MG tablet Take 1 tablet (10 mg total) by mouth at bedtime. ---- Please establish with new PCP for further refills 30 tablet 0  . Ascorbic Acid (VITAMIN C) 1000 MG tablet Take 1,000 mg by mouth every morning.     . ASTRAGALUS PO Take 1 tablet by mouth every morning.     . budesonide-formoterol (SYMBICORT) 160-4.5 MCG/ACT inhaler Inhale 2 puffs into the lungs 2 (two) times daily as needed. 1 Inhaler 3  . cycloSPORINE (RESTASIS) 0.05 % ophthalmic emulsion Place 1 drop into both eyes 2 (two) times daily.     . famotidine (PEPCID) 20 MG tablet Take 1 tablet by mouth at bedtime.  2  . fluticasone (FLONASE) 50 MCG/ACT nasal spray Place 2 sprays into both nostrils every morning. 16 g 11  . furosemide (LASIX) 20 MG tablet Take 20 mg by mouth daily.    Marland Kitchen ipratropium (ATROVENT) 0.06 % nasal spray Place 2 sprays into both nostrils 2 (two) times daily.    Marland Kitchen LORazepam (ATIVAN) 0.5 MG tablet Take 1/2- 1 tablet by mouth every 8-12 hours prn only 30 tablet 0  . Multiple Vitamin (MULTIVITAMIN) capsule Take 1 capsule by mouth every morning.     . Omega-3 1400 MG CAPS Take 2 capsules by mouth daily.    . pantoprazole (PROTONIX) 20 MG tablet Take 1 tablet (20 mg total) by mouth daily. 90 tablet 3  . pantoprazole (PROTONIX) 40 MG tablet TAKE 1 TABLET (40 MG TOTAL) BY MOUTH DAILY. TAKE 30-60 MIN BEFORE FIRST MEAL OF THE DAY 30 tablet 2  . POTASSIUM PO Take 1 tablet by mouth daily.    . ranitidine (ZANTAC) 150 MG tablet Take 1 tablet (150 mg total) by mouth 2 (two) times daily. 120 tablet 3  . vitamin B-12 (CYANOCOBALAMIN) 1000 MCG tablet Take 5,000 mcg by mouth daily.     No current facility-administered medications on file prior to visit.    Review of Systems:  As per HPI- otherwise negative. She noted desaturation to the low 80s with walking- this is her baseline per her report  and she will recover to the 90s iwht rest  Pulse Readings from Last 3 Encounters:  02/07/16 106  12/07/15 82  04/04/15 113    Physical Examination: Filed Vitals:   02/07/16 1416  BP: 114/67  Pulse: 106  Temp: 98.4 F (36.9 C)   Filed Vitals:   02/07/16 1416  Height:  (1.651 m)  Weight: 220 lb 3.2 oz (99.882 kg)   Body mass index is 36.64 kg/(m^2). Ideal Body Weight: Weight in (lb) to have BMI = 25: 149.9  GEN: WDWN, NAD, Non-toxic, A & O x 3, obese, looks well.  Wearing O2 nasal canula HEENT: Atraumatic, Normocephalic. Neck supple. No masses, No LAD. Ears and Nose: No external deformity. CV: RRR, No M/G/R. No JVD. No thrill. No extra heart sounds. PULM: CTA B, no wheezes, crackles, rhonchi. No retractions. No resp. distress. No accessory muscle use. ABD: S, NT, ND, +BS. No rebound. No HSM. EXTR: No c/c.  Minimal pedal edema bilaterally  NEURO Normal gait.  PSYCH: Normally interactive. Conversant. Not depressed or anxious appearing.  Calm demeanor.    Assessment and Plan: Lumbar radiculopathy - Plan: amitriptyline (ELAVIL) 10 MG tablet  GAD (generalized anxiety disorder) - Plan: LORazepam (ATIVAN) 0.5 MG tablet  Pedal edema - Plan: furosemide (LASIX) 20 MG tablet  Estrogen deficiency - Plan: DG Bone Density  Muscular deconditioning - Plan: Ambulatory referral to Physical Therapy  Medication monitoring encounter - Plan: CBC, Comprehensive metabolic panel  Here today to establish care and discuss a few concerns Refilled her ativan and elavil.   Refilled her lasix today for her to use as needed for swelling Suspect that her back pains with standing are due to muscular deconditioning - will refer to PT for help with strengthening  It was very nice to meet you today We will do some basic labs for you- I'll be in touch with your results asap We will refer you for a bone density scan and also to do physical therapy to work on general body strength. I hope this will  help with your back pains!    Use the furosemide as needed for swelling   Signed Abbe AmsterdamJessica Jaxtyn Linville, MD

## 2016-02-08 LAB — COMPREHENSIVE METABOLIC PANEL
ALT: 19 U/L (ref 0–35)
AST: 25 U/L (ref 0–37)
Albumin: 4.3 g/dL (ref 3.5–5.2)
Alkaline Phosphatase: 69 U/L (ref 39–117)
BUN: 12 mg/dL (ref 6–23)
CO2: 29 meq/L (ref 19–32)
Calcium: 9.8 mg/dL (ref 8.4–10.5)
Chloride: 103 mEq/L (ref 96–112)
Creatinine, Ser: 1.01 mg/dL (ref 0.40–1.20)
GFR: 70.23 mL/min (ref >60.00)
GLUCOSE: 82 mg/dL (ref 70–99)
POTASSIUM: 4 meq/L (ref 3.5–5.1)
Sodium: 141 mEq/L (ref 135–145)
Total Bilirubin: 0.3 mg/dL (ref 0.2–1.2)
Total Protein: 8.3 g/dL (ref 6.0–8.3)

## 2016-02-08 LAB — CBC
HEMATOCRIT: 35.5 % — AB (ref 36.0–46.0)
HEMOGLOBIN: 11.8 g/dL — AB (ref 12.0–15.0)
MCHC: 33.1 g/dL (ref 30.0–36.0)
MCV: 89.3 fl (ref 78.0–100.0)
Platelets: 239 10*3/uL (ref 150.0–400.0)
RBC: 3.98 Mil/uL (ref 3.87–5.11)
RDW: 13 % (ref 11.5–15.5)
WBC: 6.8 10*3/uL (ref 4.0–10.5)

## 2016-02-12 ENCOUNTER — Other Ambulatory Visit: Payer: Self-pay | Admitting: Family Medicine

## 2016-02-12 ENCOUNTER — Other Ambulatory Visit (HOSPITAL_BASED_OUTPATIENT_CLINIC_OR_DEPARTMENT_OTHER): Payer: Medicare Other

## 2016-02-12 ENCOUNTER — Ambulatory Visit (HOSPITAL_BASED_OUTPATIENT_CLINIC_OR_DEPARTMENT_OTHER)
Admission: RE | Admit: 2016-02-12 | Discharge: 2016-02-12 | Disposition: A | Discharge status: Home / Self Care | Payer: Medicare Other | Source: Ambulatory Visit | Attending: Family Medicine | Admitting: Family Medicine

## 2016-02-12 ENCOUNTER — Encounter: Payer: Self-pay | Admitting: Family Medicine

## 2016-02-12 ENCOUNTER — Ambulatory Visit: Payer: Medicare Other | Attending: Family Medicine | Admitting: Physical Therapy

## 2016-02-12 DIAGNOSIS — Z87891 Personal history of nicotine dependence: Secondary | ICD-10-CM | POA: Insufficient documentation

## 2016-02-12 DIAGNOSIS — M8588 Other specified disorders of bone density and structure, other site: Secondary | ICD-10-CM | POA: Insufficient documentation

## 2016-02-12 DIAGNOSIS — M6281 Muscle weakness (generalized): Secondary | ICD-10-CM | POA: Insufficient documentation

## 2016-02-12 DIAGNOSIS — M546 Pain in thoracic spine: Secondary | ICD-10-CM | POA: Diagnosis not present

## 2016-02-12 DIAGNOSIS — E2839 Other primary ovarian failure: Secondary | ICD-10-CM | POA: Diagnosis present

## 2016-02-12 DIAGNOSIS — M8580 Other specified disorders of bone density and structure, unspecified site: Secondary | ICD-10-CM | POA: Insufficient documentation

## 2016-02-12 DIAGNOSIS — Z13820 Encounter for screening for osteoporosis: Secondary | ICD-10-CM | POA: Diagnosis not present

## 2016-02-12 DIAGNOSIS — R2689 Other abnormalities of gait and mobility: Secondary | ICD-10-CM

## 2016-02-12 DIAGNOSIS — R293 Abnormal posture: Secondary | ICD-10-CM | POA: Diagnosis not present

## 2016-02-12 DIAGNOSIS — Z1239 Encounter for other screening for malignant neoplasm of breast: Secondary | ICD-10-CM

## 2016-02-12 DIAGNOSIS — Z780 Asymptomatic menopausal state: Secondary | ICD-10-CM | POA: Diagnosis not present

## 2016-02-12 NOTE — Therapy (Signed)
[facility] [address]  [address] [place], [place], [address] Phone: [phone]   Fax:  [phone]  Physical Therapy Evaluation  Patient Details  Name: [name] MRN: [id number] Date of Birth: November 07, 1950 Referring Provider: Abbe Amsterdam, MD  Encounter Date: 02/12/2016      PT End of Session - 02/12/16 1230    Visit Number 1   Number of Visits 16   Date for PT Re-Evaluation 04/08/16   PT Start Time 1115   PT Stop Time 1200   PT Time Calculation (min) 45 min   Activity Tolerance Patient tolerated treatment well;Patient limited by fatigue   Behavior During Therapy [facility] for tasks assessed/performed      Past Medical History  Diagnosis Date  . GERD (gastroesophageal reflux disease)   . Barrett esophagus   . Pulmonary sarcoidosis (HCC)   . Hiatal hernia   . Asthma   . Migraines   . IBS (irritable bowel syndrome)   . Anxiety     Past Surgical History  Procedure Laterality Date  . Cholecystectomy    . Rotator cuff repair      x3(2 on L, 1 on R)  . Cataract extraction, bilateral    . Bronchial biopsy  1996  . Nissen fundoplication    . Bravo ph study N/A 10/10/2013    Procedure: BRAVO PH STUDY;  Surgeon: Hart Carwin, MD;  Location: [facility];  Service: Endoscopy;  Laterality: N/A;  placed 29  . Esophagogastroduodenoscopy N/A 10/10/2013    Procedure: ESOPHAGOGASTRODUODENOSCOPY (EGD);  Surgeon: Hart Carwin, MD;  Location: [facility];  Service: Endoscopy;  Laterality: N/A;  pt asthmatic, RA 92% at rest.  Coughing frequently. Wheezes noted in upper lung fields at auscultation. Pt instructed to use home inhaler prior to procedure and placed on supportive O2 @ 2 lpm in pre-procedural. Teaching done ie. use of inhaler. Pt states she ju    There were no vitals filed for this visit.       Subjective Assessment - 02/12/16 1117    Subjective Pt is a 65 y/o female who presents to OPPT for pain in neck  (described as throbbing and pulling pain) and pain/weakness in lower extremities x approx 1 year.  Pt reports significant deconditioning since becoming O2 dependent (on O2 since 2015) affecting functional mobility.     Pertinent History GERD, Barret's esophagus, pulmonary sarcoidosis, asthma, migraines, anxiety   Limitations Standing;Walking   How long can you stand comfortably? 1 hour   How long can you walk comfortably? unable to state; but limited due to respiratory difficulties   Patient Stated Goals improve strength, energy, mobility   Currently in Pain? Yes   Pain Score 7    Pain Location Neck   Pain Orientation Right;Left;Upper   Pain Descriptors / Indicators Throbbing  pulling   Pain Type Chronic pain   Pain Onset More than a month ago   Pain Frequency Constant   Aggravating Factors  prolonged standing, sitting at computer   Pain Relieving Factors repositioning, neck pillow for support   Multiple Pain Sites Yes   Pain Score 5   Pain Location Knee   Pain Orientation Right;Left  R>L   Pain Descriptors / Indicators --  stiffness   Pain Type Chronic pain   Pain Onset More than a month ago   Pain Frequency Intermittent   Aggravating Factors  squatting, getting up from low toilet   Pain Relieving Factors elevation  [facility] PT Assessment - 02/12/16 1127    Assessment   Medical Diagnosis deconditioning; upper back and knee pain   Referring Provider Abbe Amsterdam, MD   Onset Date/Surgical Date 02/07/15   Next MD Visit PRN   Prior Therapy PT "long time ago"   Precautions   Precautions None   Restrictions   Weight Bearing Restrictions No   Balance Screen   Has the patient fallen in the past 6 months Yes   How many times? 1   Has the patient had a decrease in activity level because of a fear of falling?  No   Is the patient reluctant to leave their home because of a fear of falling?  No   Home [employer] information [employer] residence   Living  Arrangements Spouse/significant other   Type of Home House   Home Access Stairs to enter   Entrance Stairs-Number of Steps 15   Entrance Stairs-Rails Right   Home Layout One level  2 steps indoors   Home Equipment None   Additional Comments pt O2 dependent   Prior Function   Level of Independence Independent   Vocation Retired   Leisure write, read, play on computer, exercise   Observation/Other Assessments   Focus on Therapeutic Outcomes (FOTO)  47 (53% limited; predicted 38% limited)   Posture/Postural Control   Posture/Postural Control Postural limitations   Postural Limitations Rounded Shoulders;Forward head;Increased thoracic kyphosis   AROM   Overall AROM Comments bil UE's WFL without pain   Strength   Overall Strength Comments pain with bil shoulder MMT   Strength Assessment Site Hip;Knee;Ankle;Shoulder;Elbow   Right Shoulder Flexion 3/5   Right Shoulder ABduction 3/5   Right Shoulder Internal Rotation 4/5   Right Shoulder External Rotation 3/5   Left Shoulder Flexion 3/5   Left Shoulder ABduction 3/5   Left Shoulder Internal Rotation 5/5   Left Shoulder External Rotation 3/5   Right/Left Elbow Right;Left   Right Elbow Flexion 4/5   Right Elbow Extension 3+/5   Left Elbow Flexion 4/5   Left Elbow Extension 4/5   Right Hip Flexion 3/5   Right Hip ABduction 3/5   Right Hip ADduction 3/5   Left Hip Flexion 3+/5   Left Hip ABduction 3/5   Left Hip ADduction 3/5   Right/Left Knee Left;Right   Right Knee Flexion 4/5   Right Knee Extension 4-/5   Left Knee Flexion 4/5   Left Knee Extension 4/5   Right Ankle Dorsiflexion 4/5   Left Ankle Dorsiflexion 5/5   Palpation   Palpation comment tenderness along bil UT and rhomboids; tenderness along bil proximal biceps tendon and supraspinatus tendons   Ambulation/Gait   Ambulation/Gait Yes   Ambulation/Gait Assistance 7: Independent   Gait Pattern Lateral hip instability;Trendelenburg   6 Minute Walk- Baseline   6  Minute Walk- Baseline yes   HR (bpm) 104   02 Sat (%RA) 93 %   Modified Borg Scale for Dyspnea 0- Nothing at all   Perceived Rate of Exertion (Borg) 6-   6 Minute walk- Post Test   6 Minute Walk Post Test yes   HR (bpm) 121   02 Sat (%RA) 81 %   Modified Borg Scale for Dyspnea 10- Shortness of breath so severe you need to stop   Perceived Rate of Exertion (Borg) 12-   6 minute walk test results    Aerobic Endurance Distance Walked 611   Endurance additional comments pt on 2L O2, recovery  to 94% after pursed lip breathing                           PT Education - 03/09/16 1229    Education provided Yes   Education Details clinical findings; POC, goals of care   Person(s) Educated Patient   Methods Explanation   Comprehension Verbalized understanding          PT Short Term Goals - 03-09-16 1244    PT SHORT TERM GOAL #1   Title independent with HEP (03/11/16)   Time 4   Period Weeks   Status New   PT SHORT TERM GOAL #2   Title improve 6 min walk test to > 700' for improved function and mobility (03/11/16)   Time 4   Period Weeks   Status New   PT SHORT TERM GOAL #3   Title report ability to stand 1 hour without increase in pain (03/11/16)   Time 4   Period Weeks   Status New           PT Long Term Goals - 2016/03/09 1247    PT LONG TERM GOAL #1   Title verbalize understanding of posture/body mechanics to decrease risk of reinjury (04/08/16)   Time 8   Period Weeks   Status New   PT LONG TERM GOAL #2   Title improve to > 750' for improved endurance and function (04/08/16)   Time 8   Period Weeks   Status New   PT LONG TERM GOAL #3   Title improve bil UE strength to at least 4/5 for improved function and strength (04/08/16)   Time 8   Period Weeks   Status New   PT LONG TERM GOAL #4   Title improve bil LE strength to at least 4+/5 for improved function and mobility (04/08/16)   Time 8   Period Weeks   Status New                Plan - Mar 09, 2016 1230    Clinical Impression Statement Pt is a 65 y/o female who presents to OPPT for high complexity PT evaluation of deconditioning, weakness and pain in knees and upper back.   Pt demonstrates decreased functional mobility due to pain, abnormal posture, weakness and medical conditions.  O2 monitored on 2L throughout with sats decreasing to 81% with 6 min walk test.  Pt will benefit from PT to maximize function and address deficits listed.   Rehab Potential Good   PT Frequency 2x / week   PT Duration 8 weeks   PT Treatment/Interventions ADLs/Self Care Home Management;Electrical Stimulation;Cryotherapy;Moist Heat;Traction;Ultrasound;Patient/family education;Therapeutic exercise;Therapeutic activities;Functional mobility training;Stair training;Gait training;Manual techniques;Dry needling;Taping;Vasopneumatic Device   PT Next Visit Plan establish walking program; HEP for strengthening/stretches   Consulted and Agree with Plan of Care Patient      Patient will benefit from skilled therapeutic intervention in order to improve the following deficits and impairments:  Abnormal gait, Decreased strength, Postural dysfunction, Pain, Decreased activity tolerance, Decreased balance, Decreased endurance, Decreased mobility, Cardiopulmonary status limiting activity  Visit Diagnosis: Muscle weakness (generalized) - Plan: PT plan of care cert/re-cert  Other abnormalities of gait and mobility - Plan: PT plan of care cert/re-cert  Abnormal posture - Plan: PT plan of care cert/re-cert  Pain in thoracic spine - Plan: PT plan of care cert/re-cert      G-Codes - Mar 09, 2016 1259    Functional Assessment Tool Used FOTO 53%  limited   Functional Limitation Changing and maintaining body position   Changing and Maintaining Body Position Current Status [phone]([id number]) At least 40 percent but less than 60 percent impaired, limited or restricted   Changing and Maintaining Body Position Goal Status ([id number]) At  least 20 percent but less than 40 percent impaired, limited or restricted       Problem List Patient Active Problem List   Diagnosis Date Noted  . Lumbar radiculopathy 01/26/2015  . Chronic respiratory failure with hypoxia (HCC) 04/12/2014  . Obesity (BMI 30-39.9) 02/08/2014  . Allergic rhinitis 09/05/2010  . IBS 09/05/2010  . GERD 02/18/2010  . Barrett's esophagus 02/12/2010  . Upper airway cough syndrome 11/02/2009  . Anxiety state 08/29/2008  . History of colonic polyps 08/29/2008  . PULMONARY SARCOIDOSIS 08/12/2007  . Mild persistent asthma in adult without complication with component of vcd 08/12/2007  . Diaphragmatic hernia 08/12/2007  . Migraine 08/12/2007   [name], PT, DPT 02/12/2016 1:02 PM  [facility] [place] [address]  [address] [place], [place], [address] Phone: [phone]   Fax:  [phone]  Name: Kiara [name] MRN: [id number] Date of Birth: 06-24-1951

## 2016-02-14 ENCOUNTER — Ambulatory Visit (HOSPITAL_BASED_OUTPATIENT_CLINIC_OR_DEPARTMENT_OTHER): Payer: Medicare Other

## 2016-02-14 ENCOUNTER — Encounter: Payer: Self-pay | Admitting: Rehabilitative and Restorative Service Providers"

## 2016-02-14 ENCOUNTER — Ambulatory Visit: Payer: Medicare Other | Admitting: Rehabilitative and Restorative Service Providers"

## 2016-02-14 DIAGNOSIS — R293 Abnormal posture: Secondary | ICD-10-CM | POA: Diagnosis not present

## 2016-02-14 DIAGNOSIS — M6281 Muscle weakness (generalized): Secondary | ICD-10-CM

## 2016-02-14 DIAGNOSIS — R2689 Other abnormalities of gait and mobility: Secondary | ICD-10-CM

## 2016-02-14 DIAGNOSIS — M546 Pain in thoracic spine: Secondary | ICD-10-CM

## 2016-02-14 NOTE — Patient Instructions (Signed)
Chair Sitting    Sit at edge of seat, spine straight, one leg extended. Bend forward from the hip, keeping spine straight.  Support upper body with hands as needed. Hold _30__ seconds. Repeat _3__ times per session. Do __2-3_ sessions per day.    Functional Quadriceps: Sit to Stand    Sit on edge of chair, feet flat on floor. Stand upright, extending knees fully. Repeat __5__ times per set. Do _1-3___ sets per session. Do _2-3___ sessions per day.   Stand with equal weight on both legs - nice and tall and straight Stand for 1-2 minutes Try to stand 2-3 different times in a 30 minute time period   Shoulder Blade Squeeze   Can use swim noodle long spine  Rotate shoulders back, then squeeze shoulder blades down and back. Hold 10 sec Repeat _10___ times. Do __several __ sessions per day.   Trigger Point Dry Needling  . What is Trigger Point Dry Needling (DN)? o DN is a physical therapy technique used to treat muscle pain and dysfunction. Specifically, DN helps deactivate muscle trigger points (muscle knots).  o A thin filiform needle is used to penetrate the skin and stimulate the underlying trigger point. The goal is for a local twitch response (LTR) to occur and for the trigger point to relax. No medication of any kind is injected during the procedure.   . What Does Trigger Point Dry Needling Feel Like?  o The procedure feels different for each individual patient. Some patients report that they do not actually feel the needle enter the skin and overall the process is not painful. Very mild bleeding may occur. However, many patients feel a deep cramping in the muscle in which the needle was inserted. This is the local twitch response.   Marland Kitchen. How Will I feel after the treatment? o Soreness is normal, and the onset of soreness may not occur for a few hours. Typically this soreness does not last longer than two days.  o Bruising is uncommon, however; ice can be used to decrease any  possible bruising.  o In rare cases feeling tired or nauseous after the treatment is normal. In addition, your symptoms may get worse before they get better, this period will typically not last longer than 24 hours.   . What Can I do After My Treatment? o Increase your hydration by drinking more water for the next 24 hours. o You may place ice or heat on the areas treated that have become sore, however, do not use heat on inflamed or bruised areas. Heat often brings more relief post needling. o You can continue your regular activities, but vigorous activity is not recommended initially after the treatment for 24 hours. o DN is best combined with other physical therapy such as strengthening, stretching, and other therapies.  o

## 2016-02-14 NOTE — Therapy (Signed)
[facility] [place] [address]  [address] [place], [place], [address] Phone: [phone]   Fax:  [phone]  Physical Therapy Treatment  Patient Details  Name: Kiara [name] MRN: [id number] Date of Birth: 1950/11/22 Referring Provider: Abbe AmsterdamJessica Copland, MD  Encounter Date: 02/14/2016      PT End of Session - 02/14/16 1319    Visit Number 2   Number of Visits 16   Date for PT Re-Evaluation 04/08/16   PT Start Time 1318   PT Stop Time 1400   PT Time Calculation (min) 42 min   Activity Tolerance Patient tolerated treatment well      Past Medical History  Diagnosis Date  . GERD (gastroesophageal reflux disease)   . Barrett esophagus   . Pulmonary sarcoidosis (HCC)   . Hiatal hernia   . Asthma   . Migraines   . IBS (irritable bowel syndrome)   . Anxiety     Past Surgical History  Procedure Laterality Date  . Cholecystectomy    . Rotator cuff repair      x3(2 on L, 1 on R)  . Cataract extraction, bilateral    . Bronchial biopsy  1996  . Nissen fundoplication    . Bravo ph study N/A 10/10/2013    Procedure: BRAVO PH STUDY;  Surgeon: Hart Carwinora M Brodie, MD;  Location: [facility];  Service: Endoscopy;  Laterality: N/A;  placed 29  . Esophagogastroduodenoscopy N/A 10/10/2013    Procedure: ESOPHAGOGASTRODUODENOSCOPY (EGD);  Surgeon: Hart Carwinora M Brodie, MD;  [facility];  Service: Endoscopy;  Laterality: N/A;  pt asthmatic, RA 92% at rest.  Coughing frequently. Wheezes noted in upper lung fields at auscultation. Pt instructed to use home inhaler prior to procedure and placed on supportive O2 @ 2 lpm in pre-procedural. Teaching done ie. use of inhaler. Pt states she ju    There were no vitals filed for this visit.      Subjective Assessment - 02/14/16 1320    Subjective Patient reports she has pain across her shoulders. She is interested in learning more about the dry needling.    Currently in Pain? Yes   Pain Score 7    Pain Location Neck   Pain Orientation Right;Left;Upper   Pain Descriptors / Indicators Throbbing  pulling    Pain Score 5   Pain Location Knee   Pain Orientation Right;Left   Pain Descriptors / Indicators --  stiffness                         [facility] Adult PT Treatment/Exercise - 02/14/16 0001    Neuro Re-ed    Neuro Re-ed Details  working on posture and alignment   Exercises   Exercises --  patient monitoring pO2 with pulse ox    Knee/Hip Exercises: Stretches   Active Hamstring Stretch 3 reps;30 seconds   Active Hamstring Stretch Limitations sitting with LE extended heel on floor bent forward at hips    Knee/Hip Exercises: Aerobic   Stationary Bike Nustep L3 x 2 min some difficulty with O2 sats down to 85 returned to 92 after 2-3 min rest    Shoulder Exercises: Standing   Other Standing Exercises scap squeeze 10 sec x 10    Moist Heat Therapy   Number Minutes Moist Heat 10 Minutes   Moist Heat Location Shoulder  bilat upper traps sitting           Trigger Point Dry Needling -  02/14/16 1403    Consent Given? Yes   Education Handout Provided Yes   Upper Trapezius Response Twitch reponse elicited;Palpable increased muscle length  bilat in sitting               PT Education - 02/14/16 1345    Education provided Yes   Education Details HEP; TDN   Person(s) Educated Patient   Methods Explanation;Demonstration;Tactile cues;Verbal cues;Handout   Comprehension Verbalized understanding;Returned demonstration;Verbal cues required;Tactile cues required          PT Short Term Goals - 02/14/16 1523    PT SHORT TERM GOAL #1   Title independent with HEP (03/11/16)   Time 4   Period Weeks   Status On-going   PT SHORT TERM GOAL #2   Title improve 6 min walk test to > 700' for improved function and mobility (03/11/16)   Time 4   Period Weeks   Status On-going   PT SHORT TERM GOAL #3   Title report ability to stand 1 hour without increase in pain (03/11/16)    Time 4   Period Weeks   Status On-going           PT Long Term Goals - 02/14/16 1523    PT LONG TERM GOAL #1   Title verbalize understanding of posture/body mechanics to decrease risk of reinjury (04/08/16)   Time 8   Period Weeks   Status On-going   PT LONG TERM GOAL #2   Title improve to > 750' for improved endurance and function (04/08/16)   Time 8   Period Weeks   Status On-going   PT LONG TERM GOAL #3   Title improve bil UE strength to at least 4/5 for improved function and strength (04/08/16)   Time 8   Period Weeks   Status On-going   PT LONG TERM GOAL #4   Title improve bil LE strength to at least 4+/5 for improved function and mobility (04/08/16)   Time 8   Period Weeks   Status On-going               Plan - 02/14/16 1406    Clinical Impression Statement Patient has limited respiratory capacity with exercise; monitoring pO2 with exercise to maintain 90% or above - allowed patient rest time to recover when pO2 dropped under 90 (into 80's). Added exercise for home. Tolerated TDN in sitting workin on upper traps today. Will assess response at next visit. Continue with TDN as indicated.    Rehab Potential Good   PT Frequency 2x / week   PT Duration 8 weeks   PT Treatment/Interventions ADLs/Self Care Home Management;Electrical Stimulation;Cryotherapy;Moist Heat;Traction;Ultrasound;Patient/family education;Therapeutic exercise;Therapeutic activities;Functional mobility training;Stair training;Gait training;Manual techniques;Dry needling;Taping;Vasopneumatic Device   PT Next Visit Plan progress with standing and walking program; HEP for strengthening/stretches; TDN as indicated   Consulted and Agree with Plan of Care Patient      Patient will benefit from skilled therapeutic intervention in order to improve the following deficits and impairments:  Abnormal gait, Decreased strength, Postural dysfunction, Pain, Decreased activity tolerance, Decreased balance,  Decreased endurance, Decreased mobility, Cardiopulmonary status limiting activity  Visit Diagnosis: Muscle weakness (generalized)  Other abnormalities of gait and mobility  Abnormal posture  Pain in thoracic spine     Problem List Patient Active Problem List   Diagnosis Date Noted  . Osteopenia 02/12/2016  . Lumbar radiculopathy 01/26/2015  . Chronic respiratory failure with hypoxia (HCC) 04/12/2014  . Obesity (BMI 30-39.9) 02/08/2014  .  Allergic rhinitis 09/05/2010  . IBS 09/05/2010  . GERD 02/18/2010  . Barrett's esophagus 02/12/2010  . Upper airway cough syndrome 11/02/2009  . Anxiety state 08/29/2008  . History of colonic polyps 08/29/2008  . PULMONARY SARCOIDOSIS 08/12/2007  . Mild persistent asthma in adult without complication with component of vcd 08/12/2007  . Diaphragmatic hernia 08/12/2007  . Migraine 08/12/2007    Guerry Covington Rober Minion PT, MPH  02/14/2016, 3:29 PM  [facility] [address]  [address] [place], [place], [address] Phone: [phone]   Fax:  [phone]  Name: [name] MRN: [id number] Date of Birth: 1951/07/09

## 2016-02-19 ENCOUNTER — Ambulatory Visit (HOSPITAL_BASED_OUTPATIENT_CLINIC_OR_DEPARTMENT_OTHER)
Admission: RE | Admit: 2016-02-19 | Discharge: 2016-02-19 | Disposition: A | Discharge status: Home / Self Care | Payer: Medicare Other | Source: Ambulatory Visit | Attending: Family Medicine | Admitting: Family Medicine

## 2016-02-19 ENCOUNTER — Ambulatory Visit: Payer: Medicare Other | Admitting: Physical Therapy

## 2016-02-19 DIAGNOSIS — M6281 Muscle weakness (generalized): Secondary | ICD-10-CM | POA: Diagnosis not present

## 2016-02-19 DIAGNOSIS — M546 Pain in thoracic spine: Secondary | ICD-10-CM | POA: Diagnosis not present

## 2016-02-19 DIAGNOSIS — R2689 Other abnormalities of gait and mobility: Secondary | ICD-10-CM

## 2016-02-19 DIAGNOSIS — Z1239 Encounter for other screening for malignant neoplasm of breast: Secondary | ICD-10-CM | POA: Insufficient documentation

## 2016-02-19 DIAGNOSIS — R293 Abnormal posture: Secondary | ICD-10-CM | POA: Diagnosis not present

## 2016-02-19 DIAGNOSIS — Z1231 Encounter for screening mammogram for malignant neoplasm of breast: Secondary | ICD-10-CM | POA: Insufficient documentation

## 2016-02-19 NOTE — Therapy (Signed)
[facility] [address]  [address] [place], [place], [address] Phone: [phone]   Fax:  [phone]  Physical Therapy Treatment  Patient Details  Name: [name] MRN: [id number] Date of Birth: 1950/12/30 Referring Provider: Abbe Amsterdam, MD  Encounter Date: 02/19/2016      PT End of Session - 02/19/16 1340    Visit Number 3   Number of Visits 16   Date for PT Re-Evaluation 04/08/16   PT Start Time 1305  pt arrived late and limited tolerance to exercise   PT Stop Time 1338   PT Time Calculation (min) 33 min   Activity Tolerance Patient tolerated treatment well;Patient limited by fatigue   Behavior During Therapy [facility] for tasks assessed/performed      Past Medical History  Diagnosis Date  . GERD (gastroesophageal reflux disease)   . Barrett esophagus   . Pulmonary sarcoidosis (HCC)   . Hiatal hernia   . Asthma   . Migraines   . IBS (irritable bowel syndrome)   . Anxiety     Past Surgical History  Procedure Laterality Date  . Cholecystectomy    . Rotator cuff repair      x3(2 on L, 1 on R)  . Cataract extraction, bilateral    . Bronchial biopsy  1996  . Nissen fundoplication    . Bravo ph study N/A 10/10/2013    Procedure: BRAVO PH STUDY;  Surgeon: Hart Carwin, MD;  Location: [facility];  Service: Endoscopy;  Laterality: N/A;  placed 29  . Esophagogastroduodenoscopy N/A 10/10/2013    Procedure: ESOPHAGOGASTRODUODENOSCOPY (EGD);  Surgeon: Hart Carwin, MD;  Location: [facility];  Service: Endoscopy;  Laterality: N/A;  pt asthmatic, RA 92% at rest.  Coughing frequently. Wheezes noted in upper lung fields at auscultation. Pt instructed to use home inhaler prior to procedure and placed on supportive O2 @ 2 lpm in pre-procedural. Teaching done ie. use of inhaler. Pt states she ju    There were no vitals filed for this visit.      Subjective Assessment - 02/19/16 1307    Subjective doing well; has a  little pain in upper back/shoulders R>L.  felt like needling helped "I'm looking forward to Thursday."   Patient Stated Goals improve strength, energy, mobility   Currently in Pain? Yes   Pain Score 9    Pain Location Back   Pain Orientation Upper;Left;Right   Pain Descriptors / Indicators Aching   Pain Type Chronic pain   Pain Onset More than a month ago   Pain Frequency Constant   Aggravating Factors  sitting at computer   Pain Relieving Factors repositioning, neck pillow for support                         [facility] Adult PT Treatment/Exercise - 02/19/16 1316    Exercises   Exercises Shoulder   Knee/Hip Exercises: Stretches   Active Hamstring Stretch 3 reps;30 seconds   Active Hamstring Stretch Limitations sitting with LE extended heel on floor bent forward at hips    Knee/Hip Exercises: Aerobic   Stationary Bike L2 x 6 min stopping q 2 min to assess O2 (down to 86% with recovery to 93% before beginning again)   Knee/Hip Exercises: Seated   Sit to Sand 5 reps;without UE support   Shoulder Exercises: Seated   Horizontal ABduction Both;15 reps;Theraband   Theraband Level (Shoulder Horizontal ABduction) Level 3 (Green)  Shoulder Exercises: Standing   Other Standing Exercises scap squeeze 10 sec x 10    Shoulder Exercises: Stretch   Corner Stretch 2 reps;30 seconds   Corner Stretch Limitations in doorway mid level                  PT Short Term Goals - 02/14/16 1523    PT SHORT TERM GOAL #1   Title independent with HEP (03/11/16)   Time 4   Period Weeks   Status On-going   PT SHORT TERM GOAL #2   Title improve 6 min walk test to > 700' for improved function and mobility (03/11/16)   Time 4   Period Weeks   Status On-going   PT SHORT TERM GOAL #3   Title report ability to stand 1 hour without increase in pain (03/11/16)   Time 4   Period Weeks   Status On-going           PT Long Term Goals - 02/14/16 1523    PT LONG TERM GOAL #1   Title  verbalize understanding of posture/body mechanics to decrease risk of reinjury (04/08/16)   Time 8   Period Weeks   Status On-going   PT LONG TERM GOAL #2   Title improve to > 750' for improved endurance and function (04/08/16)   Time 8   Period Weeks   Status On-going   PT LONG TERM GOAL #3   Title improve bil UE strength to at least 4/5 for improved function and strength (04/08/16)   Time 8   Period Weeks   Status On-going   PT LONG TERM GOAL #4   Title improve bil LE strength to at least 4+/5 for improved function and mobility (04/08/16)   Time 8   Period Weeks   Status On-going               Plan - 02/19/16 1340    Clinical Impression Statement Pt tolerated exercises well today, needs frequent rest breaks due to O2.  Pt on 3L O2 today and spO2 monitiored throughout session.  Sats decr to 86% occasionally with quick recovery to 93% with purseld lip breathing.  Pt feels left shoulder is feeling better with postural exercises.   PT Next Visit Plan posture education, continue strengthening and functional mobilty, TDN as indicated   Consulted and Agree with Plan of Care Patient      Patient will benefit from skilled therapeutic intervention in order to improve the following deficits and impairments:     Visit Diagnosis: Muscle weakness (generalized)  Other abnormalities of gait and mobility  Abnormal posture  Pain in thoracic spine     Problem List Patient Active Problem List   Diagnosis Date Noted  . Osteopenia 02/12/2016  . Lumbar radiculopathy 01/26/2015  . Chronic respiratory failure with hypoxia (HCC) 04/12/2014  . Obesity (BMI 30-39.9) 02/08/2014  . Allergic rhinitis 09/05/2010  . IBS 09/05/2010  . GERD 02/18/2010  . Barrett's esophagus 02/12/2010  . Upper airway cough syndrome 11/02/2009  . Anxiety state 08/29/2008  . History of colonic polyps 08/29/2008  . PULMONARY SARCOIDOSIS 08/12/2007  . Mild persistent asthma in adult without complication with  component of vcd 08/12/2007  . Diaphragmatic hernia 08/12/2007  . Migraine 08/12/2007   Clarita Crane, PT, DPT 02/19/2016 1:43 PM  [facility] [address]  [address] [place], [place], [address] Phone: [phone]   Fax:  [phone]  Name:  [name] MRN: [id number] Date of Birth: December 07, 1950

## 2016-02-21 ENCOUNTER — Ambulatory Visit: Payer: Medicare Other | Admitting: Rehabilitative and Restorative Service Providers"

## 2016-02-21 ENCOUNTER — Encounter: Payer: Self-pay | Admitting: Rehabilitative and Restorative Service Providers"

## 2016-02-21 DIAGNOSIS — M6281 Muscle weakness (generalized): Secondary | ICD-10-CM

## 2016-02-21 DIAGNOSIS — M546 Pain in thoracic spine: Secondary | ICD-10-CM

## 2016-02-21 DIAGNOSIS — R293 Abnormal posture: Secondary | ICD-10-CM

## 2016-02-21 DIAGNOSIS — R2689 Other abnormalities of gait and mobility: Secondary | ICD-10-CM | POA: Diagnosis not present

## 2016-02-21 NOTE — Therapy (Signed)
[facility] Outpatient Rehabilitation [facility] [address]  [address] [place], [place], [address] Phone: [phone]   Fax:  [phone]  Physical Therapy Treatment  Patient Details  Name: [name] MRN: [id number] Date of Birth: [birth year] Referring Provider: Abbe Amsterdam, MD  Encounter Date: 02/21/2016      PT End of Session - 02/21/16 1337    Visit Number 4   Number of Visits 16   Date for PT Re-Evaluation 04/08/16   PT Start Time 1333   PT Stop Time 1415   PT Time Calculation (min) 42 min   Activity Tolerance Patient tolerated treatment well;Patient limited by fatigue      Past Medical History  Diagnosis Date  . GERD (gastroesophageal reflux disease)   . Barrett esophagus   . Pulmonary sarcoidosis (HCC)   . Hiatal hernia   . Asthma   . Migraines   . IBS (irritable bowel syndrome)   . Anxiety     Past Surgical History  Procedure Laterality Date  . Cholecystectomy    . Rotator cuff repair      x3(2 on L, 1 on R)  . Cataract extraction, bilateral    . Bronchial biopsy  1996  . Nissen fundoplication    . Bravo ph study N/A 10/10/2013    Procedure: BRAVO PH STUDY;  Surgeon: Hart Carwin, MD;  Location: [facility];  Service: Endoscopy;  Laterality: N/A;  placed 29  . Esophagogastroduodenoscopy N/A 10/10/2013    Procedure: ESOPHAGOGASTRODUODENOSCOPY (EGD);  Surgeon: Hart Carwin, MD;  Location: [facility];  Service: Endoscopy;  Laterality: N/A;  pt asthmatic, RA 92% at rest.  Coughing frequently. Wheezes noted in upper lung fields at auscultation. Pt instructed to use home inhaler prior to procedure and placed on supportive O2 @ 2 lpm in pre-procedural. Teaching done ie. use of inhaler. Pt states she ju    There were no vitals filed for this visit.      Subjective Assessment - 02/21/16 1335    Subjective Some sorenss but not bad following TDN - very relaxed thatnight and slept well that night. Would like to try the needling again.  Feels she may sit for too long at the computer - which may contribute to the tightness in the shoulder area. Patient reports that she has been having increased  difficulty with her breathing today. She does not feel she can do any exercise today.    Currently in Pain? Yes   Pain Score 4    Pain Location Back   Pain Orientation Upper;Left;Right   Pain Descriptors / Indicators Aching   Pain Type Chronic pain   Pain Onset More than a month ago   Pain Frequency Constant                         [facility] Adult PT Treatment/Exercise - 02/21/16 0001    Moist Heat Therapy   Number Minutes Moist Heat 12 Minutes   Moist Heat Location Shoulder  bilat upper traps sitting    Manual Therapy   Soft tissue mobilization bilat upper traps; leveator; rhomboids; posterior cervical musculature   Myofascial Release upper trap - bilat           Trigger Point Dry Needling - 02/21/16 1406    Consent Given? Yes   Muscles Treated Upper Body --  middle trap - scapular border - palpable decrease in tightne   Upper Trapezius Response Twitch reponse elicited;Palpable increased  muscle length   Levator Scapulae Response Twitch response elicited;Palpable increased muscle length   Rhomboids Response Twitch response elicited;Palpable increased muscle length                PT Short Term Goals - 02/14/16 1523    PT SHORT TERM GOAL #1   Title independent with HEP (03/11/16)   Time 4   Period Weeks   Status On-going   PT SHORT TERM GOAL #2   Title improve 6 min walk test to > 700' for improved function and mobility (03/11/16)   Time 4   Period Weeks   Status On-going   PT SHORT TERM GOAL #3   Title report ability to stand 1 hour without increase in pain (03/11/16)   Time 4   Period Weeks   Status On-going           PT Long Term Goals - 02/14/16 1523    PT LONG TERM GOAL #1   Title verbalize understanding of posture/body mechanics to decrease risk of reinjury (04/08/16)   Time 8    Period Weeks   Status On-going   PT LONG TERM GOAL #2   Title improve 6MWT to > 750' for improved endurance and function (04/08/16)   Time 8   Period Weeks   Status On-going   PT LONG TERM GOAL #3   Title improve bil UE strength to at least 4/5 for improved function and strength (04/08/16)   Time 8   Period Weeks   Status On-going   PT LONG TERM GOAL #4   Title improve bil LE strength to at least 4+/5 for improved function and mobility (04/08/16)   Time 8   Period Weeks   Status On-going               Plan - 02/21/16 1431    Clinical Impression Statement Patient experienced good results with initial TDN. She tolerated treatment today without difficulty - working on Psychiatric nurseadditioinal muscles/areas. Good response following TDN and moist heat - decreased pain and improved tissue extensibility.    Rehab Potential Good   PT Frequency 2x / week   PT Duration 8 weeks   PT Treatment/Interventions ADLs/Self Care Home Management;Electrical Stimulation;Cryotherapy;Moist Heat;Traction;Ultrasound;Patient/family education;Therapeutic exercise;Therapeutic activities;Functional mobility training;Stair training;Gait training;Manual techniques;Dry needling;Taping;Vasopneumatic Device   PT Next Visit Plan posture education, continue strengthening and functional mobilty, TDN as indicated   Consulted and Agree with Plan of Care Patient      Patient will benefit from skilled therapeutic intervention in order to improve the following deficits and impairments:  Abnormal gait, Decreased strength, Postural dysfunction, Pain, Decreased activity tolerance, Decreased balance, Decreased endurance, Decreased mobility, Cardiopulmonary status limiting activity  Visit Diagnosis: Muscle weakness (generalized)  Other abnormalities of gait and mobility  Abnormal posture  Pain in thoracic spine     Problem List Patient Active Problem List   Diagnosis Date Noted  . Osteopenia 02/12/2016  . Lumbar radiculopathy  01/26/2015  . Chronic respiratory failure with hypoxia (HCC) 04/12/2014  . Obesity (BMI 30-39.9) 02/08/2014  . Allergic rhinitis 09/05/2010  . IBS 09/05/2010  . GERD 02/18/2010  . Barrett's esophagus 02/12/2010  . Upper airway cough syndrome 11/02/2009  . Anxiety state 08/29/2008  . History of colonic polyps 08/29/2008  . PULMONARY SARCOIDOSIS 08/12/2007  . Mild persistent asthma in adult without complication with component of vcd 08/12/2007  . Diaphragmatic hernia 08/12/2007  . Migraine 08/12/2007    Alajia Schmelzer Rober MinionP Makiah Clauson PT, MPH  02/21/2016, 2:34 PM  [facility] [address]  [address] [place], [place], [address] Phone: [phone]   Fax:  [phone]  Name: [name] MRN: [id number] Date of Birth: 1951/06/21

## 2016-02-23 DIAGNOSIS — J45909 Unspecified asthma, uncomplicated: Secondary | ICD-10-CM | POA: Diagnosis not present

## 2016-02-23 DIAGNOSIS — D869 Sarcoidosis, unspecified: Secondary | ICD-10-CM | POA: Diagnosis not present

## 2016-02-26 ENCOUNTER — Ambulatory Visit: Payer: Medicare Other

## 2016-02-28 ENCOUNTER — Ambulatory Visit: Payer: Medicare Other

## 2016-03-03 ENCOUNTER — Telehealth: Payer: Self-pay | Admitting: Behavioral Health

## 2016-03-03 NOTE — Telephone Encounter (Signed)
Confirmed appointment with the patient for Medicare Wellness visit on tomorrow, 6/27 at 2:30 PM.

## 2016-03-04 ENCOUNTER — Ambulatory Visit (INDEPENDENT_AMBULATORY_CARE_PROVIDER_SITE_OTHER): Payer: Medicare Other

## 2016-03-04 ENCOUNTER — Ambulatory Visit: Payer: Medicare Other | Admitting: Physical Therapy

## 2016-03-04 VITALS — BP 116/68 | HR 110 | Resp 20 | Ht 65.0 in | Wt 216.8 lb

## 2016-03-04 DIAGNOSIS — Z23 Encounter for immunization: Secondary | ICD-10-CM

## 2016-03-04 DIAGNOSIS — M546 Pain in thoracic spine: Secondary | ICD-10-CM | POA: Diagnosis not present

## 2016-03-04 DIAGNOSIS — R293 Abnormal posture: Secondary | ICD-10-CM

## 2016-03-04 DIAGNOSIS — M6281 Muscle weakness (generalized): Secondary | ICD-10-CM | POA: Diagnosis not present

## 2016-03-04 DIAGNOSIS — Z0000 Encounter for general adult medical examination without abnormal findings: Secondary | ICD-10-CM | POA: Diagnosis not present

## 2016-03-04 DIAGNOSIS — R2689 Other abnormalities of gait and mobility: Secondary | ICD-10-CM | POA: Diagnosis not present

## 2016-03-04 NOTE — Progress Notes (Addendum)
Subjective:   [name] is a 65 y.o. female who presents for an Initial Medicare Annual Wellness Visit.  Review of Systems    No ROS.  Medicare Wellness Visit.  Sleep patterns: "That's an issue for me" Reports she sleeps sporadically, will sleep for 2 hours and then wake up and play game on tablet or gets on computer for ~3 hours, and then goes back to bed for another couple of hours. Sleeps better after taking amitriptyline. Feels tired when she wakes up in the morning.  Gets up twice nightly to void. No naps during the day. Sleep hygiene discussed, including avoiding computer/phone use late at night and during the night when she wakes up.  Home Safety/Smoke Alarms:  Recently moved to new house that only has 2 stairs to enter and 15 stairs to basement. Lives w/ husband. Feels very safe in home. Smoke alarms and security system in place. Pt has ordered non-skid mat for new tub, has grab bar in shower.  Firearm Safety: No firearms in the home.  Seat Belt Safety/Bike Helmet: Wears seat belt.   Dental: Sees Dr. Jeanell Sparrow [facility] but is looking for new provider.  Female:   Pap: N/A       Mammo: last done 02/19/16      Dexa scan: Last done 02/12/16, osteopenia       CCS-Cologuard completed 12/17/15, negative  Cardiac Risk Factors include: advanced age (>60men, >63 women);obesity (BMI >30kg/m2);sedentary lifestyle     Objective:    Today's Vitals   03/04/16 1411  BP: 116/68  Pulse: 110  Resp: 20  Height:  (1.651 m)  Weight: 216 lb 12.8 oz (98.34 kg)  SpO2: 96%   Body mass index is 36.08 kg/(m^2).   Current Medications (verified) Outpatient Encounter Prescriptions as of 03/04/2016  Medication Sig  . amitriptyline (ELAVIL) 10 MG tablet Take 1 tablet (10 mg total) by mouth at bedtime.  . Ascorbic Acid (VITAMIN C) 1000 MG tablet Take 1,000 mg by mouth every morning.   . ASTRAGALUS PO Take 1 tablet by mouth every morning.   . budesonide-formoterol (SYMBICORT) 160-4.5  MCG/ACT inhaler Inhale 2 puffs into the lungs 2 (two) times daily as needed.  . Cyanocobalamin (VITAMIN B-12) 5000 MCG TBDP Take 5,000 mcg by mouth daily.  . cycloSPORINE (RESTASIS) 0.05 % ophthalmic emulsion Place 1 drop into both eyes 2 (two) times daily.   . furosemide (LASIX) 20 MG tablet Take 1 tablet (20 mg total) by mouth daily. Use as needed for swelling  . LORazepam (ATIVAN) 0.5 MG tablet Take 1/2- 1 tablet by mouth every 8-12 hours prn only  . Multiple Vitamin (MULTIVITAMIN) capsule Take 1 capsule by mouth every morning.   . Nasal Moisturizer Combination (RHINASE) SOLN Place 2 sprays into the nose 2 (two) times daily.  . pantoprazole (PROTONIX) 40 MG tablet TAKE 1 TABLET (40 MG TOTAL) BY MOUTH DAILY. TAKE 30-60 MIN BEFORE FIRST MEAL OF THE DAY  . POTASSIUM PO Take 1 tablet by mouth daily.  . ranitidine (ZANTAC) 150 MG tablet Take 1 tablet (150 mg total) by mouth 2 (two) times daily.   No facility-administered encounter medications on file as of 03/04/2016.    Allergies (verified) Gabapentin; Penicillins; and Aspirin   History: Past Medical History  Diagnosis Date  . GERD (gastroesophageal reflux disease)   . Barrett esophagus   . Pulmonary sarcoidosis (HCC)   . Hiatal hernia   . Asthma   . Migraines   .  IBS (irritable bowel syndrome)   . Anxiety    Past Surgical History  Procedure Laterality Date  . Cholecystectomy    . Rotator cuff repair      x3(2 on L, 1 on R)  . Cataract extraction, bilateral    . Bronchial biopsy  1996  . Nissen fundoplication    . Bravo ph study N/A 10/10/2013    Procedure: BRAVO PH STUDY;  Surgeon: Hart Carwinora M Brodie, MD;  Location: [facility];  Service: Endoscopy;  Laterality: N/A;  placed 29  . Esophagogastroduodenoscopy N/A 10/10/2013    Procedure: ESOPHAGOGASTRODUODENOSCOPY (EGD);  Surgeon: Hart Carwinora M Brodie, MD;  [facility];  Service: Endoscopy;  Laterality: N/A;  pt asthmatic, RA 92% at rest.  Coughing frequently. Wheezes noted in upper  lung fields at auscultation. Pt instructed to use home inhaler prior to procedure and placed on supportive O2 @ 2 lpm in pre-procedural. Teaching done ie. use of inhaler. Pt states she ju   Family History  Problem Relation Age of Onset  . Adopted: Yes   Social History   Occupational History  . Semi RetiredWater engineer- Manager at [employer]    Social History Main Topics  . Smoking status: Former Smoker -- 1.00 packs/day for 20 years    Types: Cigarettes    Quit date: 09/08/1978  . Smokeless tobacco: Never Used  . Alcohol Use: No  . Drug Use: No  . Sexual Activity: Not on file    Tobacco Counseling Counseling given: Not Answered   Activities of Daily Living In your present state of health, do you have any difficulty performing the following activities: 03/04/2016  Hearing? N  Vision? N  Difficulty concentrating or making decisions? N  Walking or climbing stairs? Y  Dressing or bathing? N  Doing errands, shopping? N  Preparing Food and eating ? N  Using the Toilet? N  In the past six months, have you accidently leaked urine? N  Do you have problems with loss of bowel control? N  Managing your Medications? N  Managing your Finances? N  Housekeeping or managing your Housekeeping? Y    Immunizations and Health Maintenance Immunization History  Administered Date(s) Administered  . Influenza Split 07/29/2012  . Influenza Whole 07/09/2002, 07/09/2009, 06/20/2010  . Influenza,inj,Quad PF,36+ Mos 07/11/2013, 06/29/2014  . Pneumococcal Conjugate-13 06/29/2014  . Pneumococcal Polysaccharide-23 07/09/2002, 12/14/2009, 03/04/2016  . Td 09/05/2010   Health Maintenance Due  Topic Date Due  . Hepatitis C Screening  Jan 08, 1951  . ZOSTAVAX  09/22/2008    Patient Care Team: Pearline CablesJessica C Copland, MD as PCP - General (Family Medicine) Nelson Chimesonald Digby, MD as Consulting Physician (Ophthalmology) Nyoka CowdenMichael B Wert, MD as Consulting Physician (Pulmonary Disease) Ruffin FrederickSteven Paul Armbruster, MD as  Consulting Physician (Gastroenterology) Newman PiesSu Teoh, MD as Consulting Physician (Otolaryngology)     Assessment:   This is a routine wellness examination for [name].  Hearing/Vision screen  Hearing Screening   125Hz  250Hz  500Hz  1000Hz  2000Hz  4000Hz  8000Hz   Right ear:   Fail Fail Pass Pass   Left ear:   Fail Fail Pass Pass   Comments: Pt able to hear conversational tones w/o difficulty, passes whisper test. Difficulty hearing lower tones over the noise of her oxygen concentrator.  Vision Screening Comments: Sees Dr. Hazle Quantigby annually.  Dietary issues and exercise activities discussed: Breakfast: oatmeal w/ nuts and raisins w/ coconut or almond milk AM Snack: fruits/nuts/boiled egg Lunch: salad w/ raw vegetables, sometimes skips lunch PM Snack: fruits/vegetables/vegetable juice Dinner: Salmon, shrimp, scallops, tuna, etc.  Broiled w/ salad or sweet potato Dessert: Gelato or cashews a few times a week.   Portion control discussed w/ pt as well as the importance of eating regularly and avoiding over-eating when snacking. Discussed portioning gelato into a small bowl instead of eating it straight from the container.  Current Exercise Habits: Home exercise routine;Structured exercise class, Type of exercise: strength training/weights;stretching;walking, Time (Minutes): 45, Frequency (Times/Week): 2, Weekly Exercise (Minutes/Week): 90, Intensity: Moderate, Exercise limited by: orthopedic condition(s);respiratory conditions(s)  Goals    . Increase physical activity     As tolerated. Begin chair exercises as tolerated.    . Patient Stated     Get back to normal weight of 145-150 lbs.      Depression Screen PHQ 2/9 Scores 03/04/2016 02/07/2016 12/12/2014 06/19/2014  PHQ - 2 Score 0 0 1 0    Fall Risk Fall Risk  03/04/2016 02/07/2016 06/19/2014  Falls in the past year? No No No  Risk for fall due to : Other (Comment) - -  Risk for fall due to (comments): Pt on continuous O2 at home and has long O2  extension tubing. - -    Cognitive Function: MMSE - Mini Mental State Exam 03/04/2016  Orientation to time 5  Orientation to Place 5  Registration 3  Attention/ Calculation 5  Recall 3  Language- name 2 objects 2  Language- repeat 1  Language- follow 3 step command 3  Language- read & follow direction 1  Write a sentence 1  Copy design 1  Total score 30    Screening Tests Health Maintenance  Topic Date Due  . Hepatitis C Screening  [birth year]  . ZOSTAVAX  09/22/2008  . INFLUENZA VACCINE  04/08/2016  . MAMMOGRAM  02/18/2018  . Fecal DNA (Cologuard)  12/17/2018  . TETANUS/TDAP  09/05/2020  . DEXA SCAN  Completed  . PNA vac Low Risk Adult  Completed      Plan:   Call your insurance company to verify coverage of shingles vaccine. You received your pneumococcal vaccine today.  Follow-up with Dr. Patsy Lageropland as scheduled.   Continue doing brain stimulating activities (puzzles, reading, adult coloring books, staying active) to keep memory sharp.   Continue to eat heart healthy diet (full of fruits, vegetables, whole grains, lean protein, water--limit salt, fat, and sugar intake) and increase physical activity as tolerated.  During the course of the visit, [name] was educated and counseled about the following appropriate screening and preventive services:   Vaccines to include Pneumoccal, Influenza, Hepatitis B, Td, Zostavax, HCV  Cardiovascular disease screening  Colorectal cancer screening  Bone density screening  Mammography/PAP  Nutrition counseling  Patient Instructions (the written plan) were given to the patient.    Starla Linkarolyn J Cleatus Goodin, RN   03/04/2016

## 2016-03-04 NOTE — Therapy (Signed)
[facility] [address]  [address] [place], [place], [address] Phone: [phone]   Fax:  [phone]  Physical Therapy Treatment  Patient Details  Name: [name] MRN: [id number] Date of Birth: 23-Apr-1951 Referring Provider: Abbe AmsterdamJessica Copland, MD  Encounter Date: 03/04/2016      PT End of Session - 03/04/16 1341    Visit Number 5   Number of Visits 16   Date for PT Re-Evaluation 04/08/16   PT Start Time 1305  pt arrived late and needed to use restroom at end of session   PT Stop Time 1341   PT Time Calculation (min) 36 min   Activity Tolerance Patient tolerated treatment well   Behavior During Therapy [facility] for tasks assessed/performed      Past Medical History  Diagnosis Date  . GERD (gastroesophageal reflux disease)   . Barrett esophagus   . Pulmonary sarcoidosis (HCC)   . Hiatal hernia   . Asthma   . Migraines   . IBS (irritable bowel syndrome)   . Anxiety     Past Surgical History  Procedure Laterality Date  . Cholecystectomy    . Rotator cuff repair      x3(2 on L, 1 on R)  . Cataract extraction, bilateral    . Bronchial biopsy  1996  . Nissen fundoplication    . Bravo ph study N/A 10/10/2013    Procedure: BRAVO PH STUDY;  Surgeon: Hart Carwinora M Brodie, MD;  Location: [facility];  Service: Endoscopy;  Laterality: N/A;  placed 29  . Esophagogastroduodenoscopy N/A 10/10/2013    Procedure: ESOPHAGOGASTRODUODENOSCOPY (EGD);  Surgeon: Hart Carwinora M Brodie, MD;  [facility];  Service: Endoscopy;  Laterality: N/A;  pt asthmatic, RA 92% at rest.  Coughing frequently. Wheezes noted in upper lung fields at auscultation. Pt instructed to use home inhaler prior to procedure and placed on supportive O2 @ 2 lpm in pre-procedural. Teaching done ie. use of inhaler. Pt states she ju    There were no vitals filed for this visit.      Subjective Assessment - 03/04/16 1309    Subjective doing well; didn't come last week  because "I was out of it."  no pain; doing alright   Patient Stated Goals improve strength, energy, mobility   Currently in Pain? Yes   Pain Score 1    Pain Location Shoulder   Pain Orientation Left;Right;Upper   Pain Descriptors / Indicators Aching   Pain Type Chronic pain   Pain Onset More than a month ago   Pain Frequency Constant   Aggravating Factors  sitting at computer   Pain Relieving Factors repositioning                         [facility] Adult PT Treatment/Exercise - 03/04/16 1311    Knee/Hip Exercises: Aerobic   Nustep L 4 x 6 min with rest breaks needed to monitor O2 and bring back up to 90%   Knee/Hip Exercises: Machines for Strengthening   Cybex Knee Extension 25# 2x10   Cybex Knee Flexion 25# 2x10   Shoulder Exercises: ROM/Strengthening   Cybex Row Limitations 20# 2x10 both grips   Manual Therapy   Soft tissue mobilization bilat upper traps; leveator; rhomboids; posterior cervical musculature - used rolling stick per pt request                  PT Short Term Goals - 02/14/16 1523  PT SHORT TERM GOAL #1   Title independent with HEP (03/11/16)   Time 4   Period Weeks   Status On-going   PT SHORT TERM GOAL #2   Title improve 6 min walk test to > 700' for improved function and mobility (03/11/16)   Time 4   Period Weeks   Status On-going   PT SHORT TERM GOAL #3   Title report ability to stand 1 hour without increase in pain (03/11/16)   Time 4   Period Weeks   Status On-going           PT Long Term Goals - 02/14/16 1523    PT LONG TERM GOAL #1   Title verbalize understanding of posture/body mechanics to decrease risk of reinjury (04/08/16)   Time 8   Period Weeks   Status On-going   PT LONG TERM GOAL #2   Title improve 6MWT to > 750' for improved endurance and function (04/08/16)   Time 8   Period Weeks   Status On-going   PT LONG TERM GOAL #3   Title improve bil UE strength to at least 4/5 for improved function and strength  (04/08/16)   Time 8   Period Weeks   Status On-going   PT LONG TERM GOAL #4   Title improve bil LE strength to at least 4+/5 for improved function and mobility (04/08/16)   Time 8   Period Weeks   Status On-going               Plan - 03/04/16 1342    Clinical Impression Statement Pt tolerated exercises well today without drop in O2 during strengthening exercises.  O2 dropped to 85% on NuStep with 1-2 min recovery period.  Provided pt with community fitness options and participating [employer] fitness centers.   PT Next Visit Plan posture education, continue strengthening and functional mobilty, TDN as indicated   Consulted and Agree with Plan of Care Patient      Patient will benefit from skilled therapeutic intervention in order to improve the following deficits and impairments:     Visit Diagnosis: Muscle weakness (generalized)  Other abnormalities of gait and mobility  Abnormal posture  Pain in thoracic spine     Problem List Patient Active Problem List   Diagnosis Date Noted  . Osteopenia 02/12/2016  . Lumbar radiculopathy 01/26/2015  . Chronic respiratory failure with hypoxia (HCC) 04/12/2014  . Obesity (BMI 30-39.9) 02/08/2014  . Allergic rhinitis 09/05/2010  . IBS 09/05/2010  . GERD 02/18/2010  . Barrett's esophagus 02/12/2010  . Upper airway cough syndrome 11/02/2009  . Anxiety state 08/29/2008  . History of colonic polyps 08/29/2008  . PULMONARY SARCOIDOSIS 08/12/2007  . Mild persistent asthma in adult without complication with component of vcd 08/12/2007  . Diaphragmatic hernia 08/12/2007  . Migraine 08/12/2007   [name], PT, DPT 03/04/2016 1:44 PM  [facility] [place] [address]  [address] [place], [place], [address] Phone: [phone]   Fax:  [phone]  Name: [name] MRN: [id number] Date of Birth: November 03, 1950

## 2016-03-04 NOTE — Progress Notes (Signed)
Pre visit review using our clinic review tool, if applicable. No additional management support is needed unless otherwise documented below in the visit note. 

## 2016-03-04 NOTE — Patient Instructions (Addendum)
Call your insurance company to verify coverage of shingles vaccine. You received your pneumococcal vaccine today.  Follow-up with Dr. Patsy Lageropland as scheduled.   Continue doing brain stimulating activities (puzzles, reading, adult coloring books, staying active) to keep memory sharp.   Continue to eat heart healthy diet (full of fruits, vegetables, whole grains, lean protein, water--limit salt, fat, and sugar intake) and increase physical activity as tolerated.  Call us with any questions or concerns.  Fall Prevention in the Home  Falls can cause injuries. They can happen to people of all ages. There are many things you can do to make your home safe and to help prevent falls.  WHAT CAN I DO ON THE OUTSIDE OF MY HOME?  Regularly fix the edges of walkways and driveways and fix any cracks.  Remove anything that might make you trip as you walk through a door, such as a raised step or threshold.  Trim any bushes or trees on the path to your home.  Use bright outdoor lighting.  Clear any walking paths of anything that might make someone trip, such as rocks or tools.  Regularly check to see if handrails are loose or broken. Make sure that both sides of any steps have handrails.  Any raised decks and porches should have guardrails on the edges.  Have any leaves, snow, or ice cleared regularly.  Use sand or salt on walking paths during winter.  Clean up any spills in your garage right away. This includes oil or grease spills. WHAT CAN I DO IN THE BATHROOM?   Use night lights.  Install grab bars by the toilet and in the tub and shower. Do not use towel bars as grab bars.  Use non-skid mats or decals in the tub or shower.  If you need to sit down in the shower, use a plastic, non-slip stool.  Keep the floor dry. Clean up any water that spills on the floor as soon as it happens.  Remove soap buildup in the tub or shower regularly.  Attach bath mats securely with double-sided non-slip  rug tape.  Do not have throw rugs and other things on the floor that can make you trip. WHAT CAN I DO IN THE BEDROOM?  Use night lights.  Make sure that you have a light by your bed that is easy to reach.  Do not use any sheets or blankets that are too big for your bed. They should not hang down onto the floor.  Have a firm chair that has side arms. You can use this for support while you get dressed.  Do not have throw rugs and other things on the floor that can make you trip. WHAT CAN I DO IN THE KITCHEN?  Clean up any spills right away.  Avoid walking on wet floors.  Keep items that you use a lot in easy-to-reach places.  If you need to reach something above you, use a strong step stool that has a grab bar.  Keep electrical cords out of the way.  Do not use floor polish or wax that makes floors slippery. If you must use wax, use non-skid floor wax.  Do not have throw rugs and other things on the floor that can make you trip. WHAT CAN I DO WITH MY STAIRS?  Do not leave any items on the stairs.  Make sure that there are handrails on both sides of the stairs and use them. Fix handrails that are broken or loose. Make sure that  handrails are as long as the stairways.  Check any carpeting to make sure that it is firmly attached to the stairs. Fix any carpet that is loose or worn.  Avoid having throw rugs at the top or bottom of the stairs. If you do have throw rugs, attach them to the floor with carpet tape.  Make sure that you have a light switch at the top of the stairs and the bottom of the stairs. If you do not have them, ask someone to add them for you. WHAT ELSE CAN I DO TO HELP PREVENT FALLS?  Wear shoes that:  Do not have high heels.  Have rubber bottoms.  Are comfortable and fit you well.  Are closed at the toe. Do not wear sandals.  If you use a stepladder:  Make sure that it is fully opened. Do not climb a closed stepladder.  Make sure that both sides of  the stepladder are locked into place.  Ask someone to hold it for you, if possible.  Clearly mark and make sure that you can see:  Any grab bars or handrails.  First and last steps.  Where the edge of each step is.  Use tools that help you move around (mobility aids) if they are needed. These include:  Canes.  Walkers.  Scooters.  Crutches.  Turn on the lights when you go into a dark area. Replace any light bulbs as soon as they burn out.  Set up your furniture so you have a clear path. Avoid moving your furniture around.  If any of your floors are uneven, fix them.  If there are any pets around you, be aware of where they are.  Review your medicines with your doctor. Some medicines can make you feel dizzy. This can increase your chance of falling. Ask your doctor what other things that you can do to help prevent falls.   This information is not intended to replace advice given to you by your health care provider. Make sure you discuss any questions you have with your health care provider.   Document Released: 06/21/2009 Document Revised: 01/09/2015 Document Reviewed: 09/29/2014 Elsevier Interactive Patient Education [employer]2016 Elsevier Inc.

## 2016-03-04 NOTE — Progress Notes (Signed)
RN note reviewed. Agree with documention and plan. 

## 2016-03-06 ENCOUNTER — Ambulatory Visit: Payer: Medicare Other | Admitting: Rehabilitative and Restorative Service Providers"

## 2016-03-06 ENCOUNTER — Encounter: Payer: Self-pay | Admitting: Rehabilitative and Restorative Service Providers"

## 2016-03-06 DIAGNOSIS — R2689 Other abnormalities of gait and mobility: Secondary | ICD-10-CM | POA: Diagnosis not present

## 2016-03-06 DIAGNOSIS — R293 Abnormal posture: Secondary | ICD-10-CM

## 2016-03-06 DIAGNOSIS — M546 Pain in thoracic spine: Secondary | ICD-10-CM

## 2016-03-06 DIAGNOSIS — M6281 Muscle weakness (generalized): Secondary | ICD-10-CM

## 2016-03-06 NOTE — Therapy (Signed)
[facility] [address]  [address] [place], [place], [address] Phone: [phone]   Fax:  [phone]  Physical Therapy Treatment  Patient Details  Name: Kiara [name] MRN: [id number] Date of Birth: May 09, 1951 Referring Provider: Abbe AmsterdamJessica Copland, MD  Encounter Date: 03/06/2016      PT End of Session - 03/06/16 1317    Visit Number 6   Number of Visits 16   Date for PT Re-Evaluation 04/08/16   PT Start Time 1318   PT Stop Time 1408   PT Time Calculation (min) 50 min   Activity Tolerance Patient tolerated treatment well      Past Medical History  Diagnosis Date  . GERD (gastroesophageal reflux disease)   . Barrett esophagus   . Pulmonary sarcoidosis (HCC)   . Hiatal hernia   . Asthma   . Migraines   . IBS (irritable bowel syndrome)   . Anxiety     Past Surgical History  Procedure Laterality Date  . Cholecystectomy    . Rotator cuff repair      x3(2 on L, 1 on R)  . Cataract extraction, bilateral    . Bronchial biopsy  1996  . Nissen fundoplication    . Bravo ph study N/A 10/10/2013    Procedure: BRAVO PH STUDY;  Surgeon: Hart Carwinora M Brodie, MD;  Location: [facility];  Service: Endoscopy;  Laterality: N/A;  placed 29  . Esophagogastroduodenoscopy N/A 10/10/2013    Procedure: ESOPHAGOGASTRODUODENOSCOPY (EGD);  Surgeon: Hart Carwinora M Brodie, MD;  [facility];  Service: Endoscopy;  Laterality: N/A;  pt asthmatic, RA 92% at rest.  Coughing frequently. Wheezes noted in upper lung fields at auscultation. Pt instructed to use home inhaler prior to procedure and placed on supportive O2 @ 2 lpm in pre-procedural. Teaching done ie. use of inhaler. Pt states she ju    There were no vitals filed for this visit.      Subjective Assessment - 03/06/16 1318    Subjective Had pneumoia shot Tuesday after therapy and has had fatigue and fever in the evenings in the last two days.    Currently in Pain? Yes   Pain Score 2    Pain Location Shoulder   Pain Orientation Left;Right;Upper   Pain Descriptors / Indicators Aching   Pain Type Chronic pain   Pain Onset More than a month ago   Pain Frequency Constant                         [facility] Adult PT Treatment/Exercise - 03/06/16 0001    Knee/Hip Exercises: Aerobic   Nustep L 4 x 6 min with rest breaks needed to monitor O2 and bring back up to 90%   Shoulder Exercises: Seated   Other Seated Exercises sitting shoulder shruggs; together and alternating; scap szueeze. scapular abduction with arms at side 5-10 each    Manual Therapy   Soft tissue mobilization bilat upper traps; leveator; rhomboids; posterior cervical musculature - used rolling stick per pt request   Myofascial Release upper trap - bilat                 PT Education - 03/06/16 1359    Education provided Yes   Education Details HEP    Person(s) Educated Patient   Methods Explanation;Demonstration;Tactile cues;Verbal cues;Handout   Comprehension Verbalized understanding;Returned demonstration;Verbal cues required;Tactile cues required          PT Short Term Goals -  03/06/16 1407    PT SHORT TERM GOAL #1   Title independent with HEP (03/11/16)   Time 4   Period Weeks   Status On-going   PT SHORT TERM GOAL #2   Title improve 6 min walk test to > 700' for improved function and mobility (03/11/16)   Time 4   Period Weeks   Status On-going   PT SHORT TERM GOAL #3   Title report ability to stand 1 hour without increase in pain (03/11/16)   Time 4   Period Weeks   Status On-going           PT Long Term Goals - 03/06/16 1407    PT LONG TERM GOAL #1   Title verbalize understanding of posture/body mechanics to decrease risk of reinjury (04/08/16)   Time 8   Period Weeks   Status On-going   PT LONG TERM GOAL #2   Title improve to > 750' for improved endurance and function (04/08/16)   Time 8   Period Weeks   Status On-going   PT LONG TERM GOAL #3   Title improve  bil UE strength to at least 4/5 for improved function and strength (04/08/16)   Time 8   Period Weeks   Status On-going   PT LONG TERM GOAL #4   Title improve bil LE strength to at least 4+/5 for improved function and mobility (04/08/16)   Time 8   Period Weeks   Status On-going               Plan - 03/06/16 1402    Clinical Impression Statement Patient reports that she has lost 3 pounds since she has been coming to PT. She is feeling better overall and feels the TDN has helped. She has decreased papable tightness through neck and shoulders. Progresing toward stated goals of therapy.    Rehab Potential Good   PT Frequency 2x / week   PT Duration 8 weeks   PT Treatment/Interventions ADLs/Self Care Home Management;Electrical Stimulation;Cryotherapy;Moist Heat;Traction;Ultrasound;Patient/family education;Therapeutic exercise;Therapeutic activities;Functional mobility training;Stair training;Gait training;Manual techniques;Dry needling;Taping;Vasopneumatic Device   PT Next Visit Plan posture education, continue strengthening and functional mobilty, TDN as indicated   Consulted and Agree with Plan of Care Patient      Patient will benefit from skilled therapeutic intervention in order to improve the following deficits and impairments:  Abnormal gait, Decreased strength, Postural dysfunction, Pain, Decreased activity tolerance, Decreased balance, Decreased endurance, Decreased mobility, Cardiopulmonary status limiting activity  Visit Diagnosis: Muscle weakness (generalized)  Other abnormalities of gait and mobility  Abnormal posture  Pain in thoracic spine     Problem List Patient Active Problem List   Diagnosis Date Noted  . Osteopenia 02/12/2016  . Lumbar radiculopathy 01/26/2015  . Chronic respiratory failure with hypoxia (HCC) 04/12/2014  . Obesity (BMI 30-39.9) 02/08/2014  . Allergic rhinitis 09/05/2010  . IBS 09/05/2010  . GERD 02/18/2010  . Barrett's esophagus  02/12/2010  . Upper airway cough syndrome 11/02/2009  . Anxiety state 08/29/2008  . History of colonic polyps 08/29/2008  . PULMONARY SARCOIDOSIS 08/12/2007  . Mild persistent asthma in adult without complication with component of vcd 08/12/2007  . Diaphragmatic hernia 08/12/2007  . Migraine 08/12/2007    Enis Leatherwood Rober Minion PT, MPH  03/06/2016, 2:09 PM  [facility] [address]  [address] [place], [place], [address] Phone: [phone]   Fax:  [phone]  Name: [name] MRN: [id number] Date of  Birth: Feb 14, 1951

## 2016-03-06 NOTE — Patient Instructions (Signed)
Sitting with arms at side - lift shoulders up and then relax both together and then one at a time; roll shoulders forward and back 5 each    Scapular Retraction: Elbow Flexion (Standing)    With elbows bent to 90, pinch shoulder blades together and rotate arms out, keeping elbows bent. Repeat __5__ times per set. Do __1-2__ sets per session. Do __3-4__ sessions per day.

## 2016-03-10 ENCOUNTER — Ambulatory Visit: Payer: Medicare Other

## 2016-03-13 ENCOUNTER — Ambulatory Visit: Payer: Medicare Other | Attending: Family Medicine | Admitting: Rehabilitative and Restorative Service Providers"

## 2016-03-13 ENCOUNTER — Encounter: Payer: Self-pay | Admitting: Rehabilitative and Restorative Service Providers"

## 2016-03-13 DIAGNOSIS — R2689 Other abnormalities of gait and mobility: Secondary | ICD-10-CM | POA: Diagnosis not present

## 2016-03-13 DIAGNOSIS — M6281 Muscle weakness (generalized): Secondary | ICD-10-CM

## 2016-03-13 DIAGNOSIS — R293 Abnormal posture: Secondary | ICD-10-CM | POA: Diagnosis not present

## 2016-03-13 DIAGNOSIS — M546 Pain in thoracic spine: Secondary | ICD-10-CM

## 2016-03-13 NOTE — Therapy (Signed)
[facility] [address]  [address] [place], [place], [address] Phone: [phone]   Fax:  [phone]  Physical Therapy Treatment  Patient Details  Name: [name] MRN: [id number] Date of Birth: 06-Sep-1951 Referring Provider: Abbe Amsterdam, MD  Encounter Date: 03/13/2016      PT End of Session - 03/13/16 1322    Visit Number 7   Number of Visits 16   Date for PT Re-Evaluation 04/08/16   PT Start Time 1318   PT Stop Time 1409   PT Time Calculation (min) 51 min   Activity Tolerance Patient tolerated treatment well      Past Medical History  Diagnosis Date  . GERD (gastroesophageal reflux disease)   . Barrett esophagus   . Pulmonary sarcoidosis (HCC)   . Hiatal hernia   . Asthma   . Migraines   . IBS (irritable bowel syndrome)   . Anxiety     Past Surgical History  Procedure Laterality Date  . Cholecystectomy    . Rotator cuff repair      x3(2 on L, 1 on R)  . Cataract extraction, bilateral    . Bronchial biopsy  1996  . Nissen fundoplication    . Bravo ph study N/A 10/10/2013    Procedure: BRAVO PH STUDY;  Surgeon: Hart Carwin, MD;  Location: [facility];  Service: Endoscopy;  Laterality: N/A;  placed 29  . Esophagogastroduodenoscopy N/A 10/10/2013    Procedure: ESOPHAGOGASTRODUODENOSCOPY (EGD);  Surgeon: Hart Carwin, MD;  Location: [facility];  Service: Endoscopy;  Laterality: N/A;  pt asthmatic, RA 92% at rest.  Coughing frequently. Wheezes noted in upper lung fields at auscultation. Pt instructed to use home inhaler prior to procedure and placed on supportive O2 @ 2 lpm in pre-procedural. Teaching done ie. use of inhaler. Pt states she ju    There were no vitals filed for this visit.      Subjective Assessment - 03/13/16 1322    Subjective Allergies and sinus headache for the past week. Has increased difficulty breathing when the weather is this hot(over 80 deg) [name] reports that therappy is  helping. She has less knee pain and can move better. She can get in and out of her chair "without thinking about it".   Currently in Pain? Yes   Pain Score 1    Pain Location Shoulder   Pain Orientation Left;Right;Anterior   Pain Descriptors / Indicators Aching   Pain Onset More than a month ago   Pain Frequency Constant            [facility] PT Assessment - 03/13/16 0001    Assessment   Medical Diagnosis deconditioning; upper back and knee pain   Referring Provider Abbe Amsterdam, MD   Onset Date/Surgical Date 02/07/15   Next MD Visit PRN   Prior Therapy PT "long time ago"   Strength   Overall Strength Comments assessed with pt in sitting   Right Shoulder Flexion 4-/5   Right Shoulder ABduction 4/5   Right Shoulder Internal Rotation --  5-/5   Right Shoulder External Rotation 4+/5   Left Shoulder Flexion 4-/5   Left Shoulder ABduction 4/5   Left Shoulder Internal Rotation 5/5   Left Shoulder External Rotation 5/5   Right Elbow Flexion 5/5   Right Elbow Extension --  5-/5   Left Elbow Flexion --  5-/5   Left Elbow Extension --  5-/5   Right Hip Flexion 4-/5  Right Hip ABduction --  5-/5   Right Hip ADduction 5/5   Left Hip Flexion --  5-/5   Left Hip ABduction --  5-/5   Left Hip ADduction 5/5   Right Knee Flexion --  5-/5   Right Knee Extension --  5-/5   Left Knee Flexion --  5-/5   Left Knee Extension --  5-/5   Right Ankle Dorsiflexion 5/5   Left Ankle Dorsiflexion 5/5   Palpation   Palpation comment improving tenderness along bil UT and rhomboids; tenderness along bil proximal biceps tendon and supraspinatus tendons                     [facility] Adult PT Treatment/Exercise - 03/13/16 0001    Knee/Hip Exercises: Aerobic   Nustep L 5 x 6 min with rest breaks only 2 breaks needed to monitor O2 and bring back up to 90%   Shoulder Exercises: Seated   Other Seated Exercises sitting shoulder shruggs; together and alternating; scap szueeze.  scapular abduction with arms at side 5-10 each    Manual Therapy   Soft tissue mobilization bilat upper traps; leveator; rhomboids; posterior cervical musculature - used rolling stick per pt request   Myofascial Release upper trap - bilat           Trigger Point Dry Needling - 03/13/16 1343    Consent Given? Yes   Muscles Treated Upper Body --  middle trap - twitch/palpable decrease in tightness    Upper Trapezius Response Twitch reponse elicited;Palpable increased muscle length   Levator Scapulae Response Twitch response elicited;Palpable increased muscle length   Rhomboids Response Twitch response elicited;Palpable increased muscle length                PT Short Term Goals - 03/13/16 1359    PT SHORT TERM GOAL #1   Title independent with HEP (03/11/16)   Time 4   Period Weeks   Status On-going   PT SHORT TERM GOAL #2   Title improve 6 min walk test to > 700' for improved function and mobility (03/11/16)   Time 4   Period Weeks   Status On-going   PT SHORT TERM GOAL #3   Title report ability to stand 1 hour without increase in pain (03/11/16)   Time 4   Period Weeks   Status On-going           PT Long Term Goals - 03/13/16 1355    PT LONG TERM GOAL #1   Title verbalize understanding of posture/body mechanics to decrease risk of reinjury (04/08/16)   Time 8   Period Weeks   Status On-going   PT LONG TERM GOAL #2   Title improve 6MWT to > 750' for improved endurance and function (04/08/16)   Time 8   Period Weeks   Status On-going   PT LONG TERM GOAL #3   Title improve bil UE strength to at least 4/5 for improved function and strength (04/08/16)   Time 8   Period Weeks   Status On-going   PT LONG TERM GOAL #4   Title improve bil LE strength to at least 4+/5 for improved function and mobility (04/08/16)   Time 8   Period Weeks   Status Achieved               Plan - 03/13/16 1356    Clinical Impression Statement Patient is progressing well with good  gains in strength and function. She  has acccomplished strength goal and is progressing toward remainder of goals. Patient has less tightness to palpation through shoudler girdle area. She reports increased activity level and greater endurance for ADL's and walking. Patient will continue PT for 1-2 weeks with plan to progress to community based exercise program after vacation.    Rehab Potential Good   PT Frequency 2x / week   PT Duration 8 weeks   PT Treatment/Interventions ADLs/Self Care Home Management;Electrical Stimulation;Cryotherapy;Moist Heat;Traction;Ultrasound;Patient/family education;Therapeutic exercise;Therapeutic activities;Functional mobility training;Stair training;Gait training;Manual techniques;Dry needling;Taping;Vasopneumatic Device   PT Next Visit Plan posture education, continue strengthening and functional mobilty, TDN as indicated   Consulted and Agree with Plan of Care Patient      Patient will benefit from skilled therapeutic intervention in order to improve the following deficits and impairments:  Abnormal gait, Decreased strength, Postural dysfunction, Pain, Decreased activity tolerance, Decreased balance, Decreased endurance, Decreased mobility, Cardiopulmonary status limiting activity  Visit Diagnosis: Muscle weakness (generalized)  Other abnormalities of gait and mobility  Abnormal posture  Pain in thoracic spine     Problem List Patient Active Problem List   Diagnosis Date Noted  . Osteopenia 02/12/2016  . Lumbar radiculopathy 01/26/2015  . Chronic respiratory failure with hypoxia (HCC) 04/12/2014  . Obesity (BMI 30-39.9) 02/08/2014  . Allergic rhinitis 09/05/2010  . IBS 09/05/2010  . GERD 02/18/2010  . Barrett's esophagus 02/12/2010  . Upper airway cough syndrome 11/02/2009  . Anxiety state 08/29/2008  . History of colonic polyps 08/29/2008  . PULMONARY SARCOIDOSIS 08/12/2007  . Mild persistent asthma in adult without complication with component  of vcd 08/12/2007  . Diaphragmatic hernia 08/12/2007  . Migraine 08/12/2007    [name]  PT, MPH   03/13/2016, 2:00 PM  [facility] [address]  [address] [place], [place], [address] Phone: [phone]   Fax:  [phone]  Name: Kiara [name] MRN: [id number] Date of Birth: [birth year]

## 2016-03-20 ENCOUNTER — Ambulatory Visit (INDEPENDENT_AMBULATORY_CARE_PROVIDER_SITE_OTHER): Payer: Medicare Other | Admitting: Family Medicine

## 2016-03-20 VITALS — BP 102/67 | HR 102 | Temp 98.5°F | Ht 65.0 in | Wt 216.0 lb

## 2016-03-20 DIAGNOSIS — R5381 Other malaise: Secondary | ICD-10-CM

## 2016-03-20 DIAGNOSIS — G4452 New daily persistent headache (NDPH): Secondary | ICD-10-CM | POA: Diagnosis not present

## 2016-03-20 DIAGNOSIS — D869 Sarcoidosis, unspecified: Secondary | ICD-10-CM

## 2016-03-20 LAB — CBC
HEMATOCRIT: 36 % (ref 36.0–46.0)
HEMOGLOBIN: 11.9 g/dL — AB (ref 12.0–15.0)
MCHC: 33 g/dL (ref 30.0–36.0)
MCV: 89.5 fl (ref 78.0–100.0)
PLATELETS: 285 10*3/uL (ref 150.0–400.0)
RBC: 4.03 Mil/uL (ref 3.87–5.11)
RDW: 13.6 % (ref 11.5–15.5)
WBC: 6.9 10*3/uL (ref 4.0–10.5)

## 2016-03-20 LAB — POC URINALSYSI DIPSTICK (AUTOMATED)
Bilirubin, UA: NEGATIVE
Blood, UA: NEGATIVE
GLUCOSE UA: NEGATIVE
Ketones, UA: NEGATIVE
LEUKOCYTES UA: NEGATIVE
Nitrite, UA: NEGATIVE
PROTEIN UA: NEGATIVE
SPEC GRAV UA: 1.015
UROBILINOGEN UA: NEGATIVE
pH, UA: 6

## 2016-03-20 NOTE — Patient Instructions (Signed)
Try taking tylenol when you get home I will be in touch with your labs asap Let me know if your headaches get worse or if they are not improving with tylenol

## 2016-03-20 NOTE — Progress Notes (Signed)
[facility] [address] [place], [place] [address] [phone]  Date:  03/20/2016   Name:  [name]   DOB:  07-Jul-1951   MRN:  [id number]  PCP:  Abbe Amsterdam, MD    Chief Complaint: No chief complaint on file.   History of Present Illness:  [name] is a 65 y.o. very pleasant female patient who presents with the following:  Here today with complaint of headaches since Saturday- today is Thursday. On Saturday she did some errands, felt ok. However she then stood up from sitting and felt a sharp pain "shoot up from the back of my head."  She did not have any other neurological sx at that time but she did get a HA.  She took 2 tylenol PM and went to sleep. When she woke up the HA was still present but it is not as bad. The HA is now in the top of her head. The HA is not currently severe but is persistent.   No nausea or vomiting She has noted some photophobia but no phonophobia.   She does have migraine HA sometimes, but this does not seem quite like a migraine to her  No weakness or slurred speech.   Since she took the tylenol she has not tried any other medications for the HA.  She also notes pain in the very lower back with sitting or when she coughs (the cough is baseline with her sarcoidosis) She feels like her temperature is a little higher than normal but has not noted any fever No belly pain, no sore throat, she is eating normally No recent medication change   History of pulmonary sarcoidosis-  She is also a pt of Dr. Sherene Sires for her pulmonary concerns.  She has pulm sarcoidosis. She is on oxygen via [place]- 2 or 3L depending on her activity level. She is on this 24 hours a day- has done so for 2 years now.  She feels like her breathing is as good as normal- she has not noted a change here   Pulse Readings from Last 3 Encounters:  03/20/16 117  03/04/16 110  02/07/16 96     Patient Active  Problem List   Diagnosis Date Noted  . Osteopenia 02/12/2016  . Lumbar radiculopathy 01/26/2015  . Chronic respiratory failure with hypoxia (HCC) 04/12/2014  . Obesity (BMI 30-39.9) 02/08/2014  . Allergic rhinitis 09/05/2010  . IBS 09/05/2010  . GERD 02/18/2010  . Barrett's esophagus 02/12/2010  . Upper airway cough syndrome 11/02/2009  . Anxiety state 08/29/2008  . History of colonic polyps 08/29/2008  . PULMONARY SARCOIDOSIS 08/12/2007  . Mild persistent asthma in adult without complication with component of vcd 08/12/2007  . Diaphragmatic hernia 08/12/2007  . Migraine 08/12/2007    Past Medical History  Diagnosis Date  . GERD (gastroesophageal reflux disease)   . Barrett esophagus   . Pulmonary sarcoidosis (HCC)   . Hiatal hernia   . Asthma   . Migraines   . IBS (irritable bowel syndrome)   . Anxiety     Past Surgical History  Procedure Laterality Date  . Cholecystectomy    . Rotator cuff repair      x3(2 on L, 1 on R)  . Cataract extraction, bilateral    . Bronchial biopsy  1996  . Nissen fundoplication    . Bravo ph study N/A 10/10/2013    Procedure: BRAVO PH STUDY;  Surgeon: Hart Carwinora M Brodie, MD;  [facility];  Service: Endoscopy;  Laterality: N/A;  placed 29  . Esophagogastroduodenoscopy N/A 10/10/2013    Procedure: ESOPHAGOGASTRODUODENOSCOPY (EGD);  Surgeon: Hart Carwinora M Brodie, MD;  [facility];  Service: Endoscopy;  Laterality: N/A;  pt asthmatic, RA 92% at rest.  Coughing frequently. Wheezes noted in upper lung fields at auscultation. Pt instructed to use home inhaler prior to procedure and placed on supportive O2 @ 2 lpm in pre-procedural. Teaching done ie. use of inhaler. Pt states she ju    Social History  Substance Use Topics  . Smoking status: Former Smoker -- 1.00 packs/day for 20 years    Types: Cigarettes    Quit date: 09/08/1978  . Smokeless tobacco: Never Used  . Alcohol Use: No    Family History  Problem Relation Age of Onset  .  Adopted: Yes    Allergies  Allergen Reactions  . Gabapentin     Edema on 2 separate trials  . Penicillins Other (See Comments)    Unknown - childhood allergy  . Aspirin Other (See Comments)    gastritis    Medication list has been reviewed and updated.  Current Outpatient Prescriptions on File Prior to Visit  Medication Sig Dispense Refill  . amitriptyline (ELAVIL) 10 MG tablet Take 1 tablet (10 mg total) by mouth at bedtime. 90 tablet 3  . Ascorbic Acid (VITAMIN C) 1000 MG tablet Take 1,000 mg by mouth every morning.     . ASTRAGALUS PO Take 1 tablet by mouth every morning.     . budesonide-formoterol (SYMBICORT) 160-4.5 MCG/ACT inhaler Inhale 2 puffs into the lungs 2 (two) times daily as needed. 1 Inhaler 3  . Cyanocobalamin (VITAMIN B-12) 5000 MCG TBDP Take 5,000 mcg by mouth daily.    . cycloSPORINE (RESTASIS) 0.05 % ophthalmic emulsion Place 1 drop into both eyes 2 (two) times daily.     . furosemide (LASIX) 20 MG tablet Take 1 tablet (20 mg total) by mouth daily. Use as needed for swelling 90 tablet 1  . LORazepam (ATIVAN) 0.5 MG tablet Take 1/2- 1 tablet by mouth every 8-12 hours prn only 30 tablet 0  . Multiple Vitamin (MULTIVITAMIN) capsule Take 1 capsule by mouth every morning.     . Nasal Moisturizer Combination (RHINASE) SOLN Place 2 sprays into the nose 2 (two) times daily.    . pantoprazole (PROTONIX) 40 MG tablet TAKE 1 TABLET (40 MG TOTAL) BY MOUTH DAILY. TAKE 30-60 MIN BEFORE FIRST MEAL OF THE DAY 30 tablet 2  . POTASSIUM PO Take 1 tablet by mouth daily.    . ranitidine (ZANTAC) 150 MG tablet Take 1 tablet (150 mg total) by mouth 2 (two) times daily. 120 tablet 3   No current facility-administered medications on file prior to visit.    Review of Systems:  As per HPI- otherwise negative.   Physical Examination: Filed Vitals:   03/20/16 1126  BP: 102/67  Pulse: 117  Temp: 98.5 F (36.9 C)   There were no vitals filed for this visit. There is no weight on  file to calculate BMI. Ideal Body Weight:    GEN: WDWN, NAD, Non-toxic, A & O x 3, elderly lady who looks well.  Uses oxygen via [place] HEENT: Atraumatic, Normocephalic. Neck supple. No masses, No LAD.  Bilateral TM wnl, oropharynx normal.  PEERL,EOMI.   Ears and Nose: No external deformity. CV: RRR, No M/G/R. No JVD. No thrill. No extra heart sounds. PULM: CTA  B, no wheezes, crackles, rhonchi. No retractions. No resp. distress. No accessory muscle use. EXTR: No c/c/e NEURO Normal gait for pt, slow.  Moves all extremities normally, no slurred speech PSYCH: Normally interactive. Conversant. Not depressed or anxious appearing.  Calm demeanor.   Results for orders placed or performed in visit on 03/20/16  POCT Urinalysis Dipstick (Automated)  Result Value Ref Range   Color, UA Yellow    Clarity, UA Clear    Glucose, UA Negative    Bilirubin, UA Negaitve    Ketones, UA Negative    Spec Grav, UA 1.015    Blood, UA negative    pH, UA 6.0    Protein, UA negative    Urobilinogen, UA negative    Nitrite, UA negative    Leukocytes, UA Negative Negative    Assessment and Plan: New daily persistent headache - Plan: POCT Urinalysis Dipstick (Automated), CBC, Comprehensive metabolic panel  Sarcoidosis (HCC)  Malaise - Plan: POCT Urinalysis Dipstick (Automated)  Here today with vague malaise and a headache.  The HA is not very severe and there are no other neurological sx, but the HA is more persistent than usual for her.  Offered to perform imaging of her head such as a CT>  After discussion of risks and benefits she declines imaging, but will let me know if her headache does not improve with tylenol Continue oxygen for sarcoid, she is at baseline here Await the rest of her labs and will be in touch with her. No evidence of UTI   Signed Abbe Amsterdam, MD

## 2016-03-21 ENCOUNTER — Ambulatory Visit (HOSPITAL_BASED_OUTPATIENT_CLINIC_OR_DEPARTMENT_OTHER)
Admission: RE | Admit: 2016-03-21 | Discharge: 2016-03-21 | Disposition: A | Discharge status: Home / Self Care | Payer: Medicare Other | Source: Ambulatory Visit | Attending: Family Medicine | Admitting: Family Medicine

## 2016-03-21 ENCOUNTER — Encounter: Payer: Self-pay | Admitting: Family Medicine

## 2016-03-21 DIAGNOSIS — R51 Headache: Secondary | ICD-10-CM | POA: Diagnosis not present

## 2016-03-21 DIAGNOSIS — G4485 Primary stabbing headache: Secondary | ICD-10-CM

## 2016-03-21 LAB — COMPREHENSIVE METABOLIC PANEL
ALK PHOS: 65 U/L (ref 39–117)
ALT: 17 U/L (ref 0–35)
AST: 22 U/L (ref 0–37)
Albumin: 4 g/dL (ref 3.5–5.2)
BILIRUBIN TOTAL: 0.3 mg/dL (ref 0.2–1.2)
BUN: 14 mg/dL (ref 6–23)
CALCIUM: 9.6 mg/dL (ref 8.4–10.5)
CO2: 31 mEq/L (ref 19–32)
Chloride: 101 mEq/L (ref 96–112)
Creatinine, Ser: 1.01 mg/dL (ref 0.40–1.20)
GFR: 70.21 mL/min (ref >60.00)
Glucose, Bld: 83 mg/dL (ref 70–99)
Potassium: 4.1 mEq/L (ref 3.5–5.1)
Sodium: 139 mEq/L (ref 135–145)
TOTAL PROTEIN: 8.1 g/dL (ref 6.0–8.3)

## 2016-03-21 NOTE — Telephone Encounter (Signed)
Called pt- we would like to get a CT of her head as she continues to have a severe an unusual HA now for a little over a week.  Will arrange this for her

## 2016-03-24 ENCOUNTER — Encounter: Payer: Self-pay | Admitting: Family Medicine

## 2016-03-24 DIAGNOSIS — J45909 Unspecified asthma, uncomplicated: Secondary | ICD-10-CM | POA: Diagnosis not present

## 2016-03-24 DIAGNOSIS — D869 Sarcoidosis, unspecified: Secondary | ICD-10-CM | POA: Diagnosis not present

## 2016-03-26 ENCOUNTER — Ambulatory Visit: Payer: Medicare Other

## 2016-04-01 ENCOUNTER — Ambulatory Visit: Payer: Medicare Other

## 2016-04-22 ENCOUNTER — Ambulatory Visit: Payer: Medicare Other

## 2016-04-24 DIAGNOSIS — D869 Sarcoidosis, unspecified: Secondary | ICD-10-CM | POA: Diagnosis not present

## 2016-04-24 DIAGNOSIS — J45909 Unspecified asthma, uncomplicated: Secondary | ICD-10-CM | POA: Diagnosis not present

## 2016-04-29 ENCOUNTER — Telehealth: Payer: Self-pay | Admitting: Family Medicine

## 2016-04-29 DIAGNOSIS — R29898 Other symptoms and signs involving the musculoskeletal system: Secondary | ICD-10-CM

## 2016-04-29 NOTE — Telephone Encounter (Signed)
I did referral for her.  Please call and let her know- [facility]

## 2016-04-29 NOTE — Telephone Encounter (Signed)
Caller name:Jory Kasprzak Relationship to patient: Can be [phone] Pharmacy:  Reason for call Patient is requesting to be referred back to PT, needs referral for PT and dry needle. Request to return to [facility] Pt. Please advise

## 2016-04-30 NOTE — Telephone Encounter (Signed)
Called pt to inform her that her referral has been sent for PT. Pt verbalized understanding and had no additional concerns at this time.

## 2016-05-15 ENCOUNTER — Encounter: Payer: Self-pay | Admitting: Rehabilitative and Restorative Service Providers"

## 2016-05-15 ENCOUNTER — Ambulatory Visit: Payer: Medicare Other | Attending: Family Medicine | Admitting: Rehabilitative and Restorative Service Providers"

## 2016-05-15 DIAGNOSIS — M6281 Muscle weakness (generalized): Secondary | ICD-10-CM | POA: Diagnosis not present

## 2016-05-15 DIAGNOSIS — R29898 Other symptoms and signs involving the musculoskeletal system: Secondary | ICD-10-CM

## 2016-05-15 DIAGNOSIS — M546 Pain in thoracic spine: Secondary | ICD-10-CM | POA: Diagnosis not present

## 2016-05-15 DIAGNOSIS — R293 Abnormal posture: Secondary | ICD-10-CM | POA: Diagnosis not present

## 2016-05-15 NOTE — Therapy (Signed)
[facility] [address]  [address] [place], [place], [address] Phone: [phone]   Fax:  [phone]  Physical Therapy Treatment  Patient Details  Name: [name] MRN: [id number] Date of Birth: 1951-03-06 Referring Provider: Abbe Amsterdam, MD   Encounter Date: 05/15/2016      PT End of Session - 05/15/16 0943    Visit Number 8   Number of Visits 20   Date for PT Re-Evaluation 06/26/16   PT Start Time 0943   PT Stop Time 1020   PT Time Calculation (min) 37 min   Activity Tolerance Patient tolerated treatment well      Past Medical History:  Diagnosis Date  . Anxiety   . Asthma   . Barrett esophagus   . GERD (gastroesophageal reflux disease)   . Hiatal hernia   . IBS (irritable bowel syndrome)   . Migraines   . Pulmonary sarcoidosis [facility])     Past Surgical History:  Procedure Laterality Date  . BRAVO PH STUDY N/A 10/10/2013   Procedure: BRAVO PH STUDY;  Surgeon: Hart Carwin, MD;  Location: [facility];  Service: Endoscopy;  Laterality: N/A;  placed 29  . BRONCHIAL BIOPSY  1996  . CATARACT EXTRACTION, BILATERAL    . CHOLECYSTECTOMY    . ESOPHAGOGASTRODUODENOSCOPY N/A 10/10/2013   Procedure: ESOPHAGOGASTRODUODENOSCOPY (EGD);  Surgeon: Hart Carwin, MD;  Location: [facility];  Service: Endoscopy;  Laterality: N/A;  pt asthmatic, RA 92% at rest.  Coughing frequently. Wheezes noted in upper lung fields at auscultation. Pt instructed to use home inhaler prior to procedure and placed on supportive O2 @ 2 lpm in pre-procedural. Teaching done ie. use of inhaler. Pt states she ju  . NISSEN FUNDOPLICATION    . ROTATOR CUFF REPAIR     x3(2 on L, 1 on R)    There were no vitals filed for this visit.      Subjective Assessment - 05/15/16 0945    Subjective Patient reports that she has recurrent pain in the shoulders. Symptoms increased after she stopped therapy about one month. She has problems when she stands  too long doing ADL's. The TDN helped a lot even though it hurts.    Currently in Pain? Yes   Pain Score 5    Pain Location Shoulder   Pain Orientation Right;Left   Pain Descriptors / Indicators Tightness;Aching   Pain Type Chronic pain   Pain Onset More than a month ago   Pain Frequency Constant   Aggravating Factors  stress; sitting at computer   Pain Relieving Factors moving; changing positions             [facility] PT Assessment - 05/15/16 0001      Assessment   Medical Diagnosis deconditioning; upper back    Referring Provider Abbe Amsterdam, MD    Onset Date/Surgical Date 02/07/15   Next MD Visit PRN   Prior Therapy PT "long time ago"     Precautions   Precautions None     Balance Screen   Has the patient fallen in the past 6 months Yes   How many times? 1   Has the patient had a decrease in activity level because of a fear of falling?  No   Is the patient reluctant to leave their home because of a fear of falling?  No     Home [employer]ist information centre manager residence   Living Arrangements Spouse/significant other  Type of Home House   Home Access Stairs to enter   Entrance Stairs-Number of Steps 15   Entrance Stairs-Rails Right   Home Layout One level  2 steps inside    Home Equipment None   Additional Comments pt O2 dependent     Prior Function   Level of Independence Independent   Vocation Retired   Leisure write, read, play on computer, exercise     Observation/Other Assessments   Focus on Therapeutic Outcomes (FOTO)  605 limitation     AROM   Cervical Flexion 39   Cervical Extension 58   Cervical - Right Side Bend 28   Cervical - Left Side Bend 32   Cervical - Right Rotation 68   Cervical - Left Rotation 59     Strength   Right Shoulder Flexion 4-/5   Right Shoulder ABduction 4/5   Right Shoulder Internal Rotation --  5-/5   Right Shoulder External Rotation 4+/5   Left Shoulder Flexion 4-/5   Left Shoulder ABduction 4/5   Left  Shoulder Internal Rotation 5/5   Left Shoulder External Rotation 5/5     Palpation   Palpation comment tenderness along bil UT and rhomboids; tenderness along bil proximal biceps tendon and supraspinatus tendons                     [facility] Adult PT Treatment/Exercise - 05/15/16 0001      Manual Therapy   Manual therapy comments pt sitting    Soft tissue mobilization bilat upper traps; leveator; rhomboids; posterior cervical musculature    Myofascial Release upper trap - bilat           Trigger Point Dry Needling - 05/15/16 1008    Consent Given? Yes   Upper Trapezius Response Palpable increased muscle length  bilat    Levator Scapulae Response Palpable increased muscle length  bilat    Rhomboids Response Palpable increased muscle length  Rt                PT Short Term Goals - 05/15/16 1011      PT SHORT TERM GOAL #1   Title independent with HEP (06/18/16)   Time 5   Period Weeks   Status New     PT SHORT TERM GOAL #2   Title Improve tolerance for ADL activities to 5-10 min with minimal increase in shoulder pain 06/18/16   Time 5   Period Weeks   Status New     PT SHORT TERM GOAL #3   Title improve sitting posture and alignment with patient to demonstrated more upright posture head on neck 06/18/16   Time 5   Period Weeks   Status New           PT Long Term Goals - 05/15/16 1013      PT LONG TERM GOAL #1   Title verbalize understanding of posture/body mechanics to decrease neck and shoulder pain/discomfort 07/10/16   Time 8   Period Weeks   Status New     PT LONG TERM GOAL #2   Title Improve cervical ROM by 5-8 degrees in lateral flexion and rotation 07/10/16   Time 8   Period Weeks   Status New     PT LONG TERM GOAL #3   Title Decrease muscular tightness to palpation through the upper traps/shoulders 07/10/16   Time 8   Period Weeks   Status New     PT LONG TERM GOAL #4  Title Improve FOTO to </= 47% limitation 07/10/16    Time 8   Period Weeks   Status New               Plan - 05/15/16 1009    Clinical Impression Statement Patient presents with recurrent bilat shoulder pain and tightness. She has limited cervical ROM/mobility; muscular tightness to palpation; poor posture and alignment; limited functional activity level due to tightness and pain in the shoulders.    Rehab Potential Good   PT Frequency 1x / week   PT Duration 8 weeks   PT Treatment/Interventions ADLs/Self Care Home Management;Electrical Stimulation;Cryotherapy;Moist Heat;Traction;Ultrasound;Patient/family education;Therapeutic exercise;Therapeutic activities;Functional mobility training;Stair training;Gait training;Manual techniques;Dry needling;Taping;Vasopneumatic Device   PT Next Visit Plan posture education, TDN/manual work through the upper traps/shoudlers as indicated   Financial plannerConsulted and Agree with Plan of Care Patient      Patient will benefit from skilled therapeutic intervention in order to improve the following deficits and impairments:  Abnormal gait, Decreased strength, Postural dysfunction, Pain, Decreased activity tolerance, Decreased balance, Decreased endurance, Decreased mobility, Cardiopulmonary status limiting activity  Visit Diagnosis: Muscle weakness (generalized) - Plan: PT plan of care cert/re-cert  Other symptoms and signs involving the musculoskeletal system - Plan: PT plan of care cert/re-cert  Abnormal posture - Plan: PT plan of care cert/re-cert  Pain in thoracic spine - Plan: PT plan of care cert/re-cert       G-Codes - 05/15/16 1017    Functional Assessment Tool Used Evaluation; FOTO; clinical assessment    Functional Limitation Changing and maintaining body position   Changing and Maintaining Body Position Current Status (Z6109(G8981) At least 60 percent but less than 80 percent impaired, limited or restricted   Changing and Maintaining Body Position Goal Status (U0454(G8982) At least 40 percent but less than 60  percent impaired, limited or restricted      Problem List Patient Active Problem List   Diagnosis Date Noted  . Osteopenia 02/12/2016  . Lumbar radiculopathy 01/26/2015  . Chronic respiratory failure with hypoxia (HCC) 04/12/2014  . Obesity (BMI 30-39.9) 02/08/2014  . Allergic rhinitis 09/05/2010  . IBS 09/05/2010  . GERD 02/18/2010  . Barrett's esophagus 02/12/2010  . Upper airway cough syndrome 11/02/2009  . Anxiety state 08/29/2008  . History of colonic polyps 08/29/2008  . PULMONARY SARCOIDOSIS 08/12/2007  . Mild persistent asthma in adult without complication with component of vcd 08/12/2007  . Diaphragmatic hernia 08/12/2007  . Migraine 08/12/2007    Husein Guedes Rober MinionP Morgyn Marut PT, MPH  05/15/2016, 10:20 AM  [facility] [place] [address]  [address] [place], [place], [address] Phone: [phone]   Fax:  [phone]  Name: Margaretha SeedsBarbara V Monestime MRN: [id number] Date of Birth: November 20, 1950

## 2016-05-22 ENCOUNTER — Ambulatory Visit: Payer: Medicare Other | Admitting: Rehabilitative and Restorative Service Providers"

## 2016-05-22 ENCOUNTER — Encounter: Payer: Self-pay | Admitting: Rehabilitative and Restorative Service Providers"

## 2016-05-22 DIAGNOSIS — R29898 Other symptoms and signs involving the musculoskeletal system: Secondary | ICD-10-CM

## 2016-05-22 DIAGNOSIS — M6281 Muscle weakness (generalized): Secondary | ICD-10-CM

## 2016-05-22 DIAGNOSIS — R293 Abnormal posture: Secondary | ICD-10-CM

## 2016-05-22 DIAGNOSIS — M546 Pain in thoracic spine: Secondary | ICD-10-CM

## 2016-05-22 NOTE — Patient Instructions (Signed)

## 2016-05-22 NOTE — Therapy (Addendum)
[facility] [place] [address]  [place], [place], [address] Phone: [phone]   Fax:  [phone]  Physical Therapy Treatment  Patient Details  Name: [name] MRN: [id number] Date of Birth: 1951/06/03 Referring Provider: Lamar Blinks, MD   Encounter Date: 05/22/2016      PT End of Session - 05/22/16 1526    Visit Number 9   Number of Visits 20   Date for PT Re-Evaluation 06/26/16   PT Start Time 6503   PT Stop Time 1534   PT Time Calculation (min) 49 min   Activity Tolerance Patient tolerated treatment well      Past Medical History:  Diagnosis Date  . Anxiety   . Asthma   . Barrett esophagus   . GERD (gastroesophageal reflux disease)   . Hiatal hernia   . IBS (irritable bowel syndrome)   . Migraines   . Pulmonary sarcoidosis [facility])     Past Surgical History:  Procedure Laterality Date  . BRAVO [place] STUDY N/A 10/10/2013   Procedure: BRAVO [place];  Surgeon: Lafayette Dragon, MD;  [facility]: [facility];  Service: Endoscopy;  Laterality: N/A;  placed 29  . BRONCHIAL BIOPSY  1996  . CATARACT EXTRACTION, BILATERAL    . CHOLECYSTECTOMY    . ESOPHAGOGASTRODUODENOSCOPY N/A 10/10/2013   Procedure: ESOPHAGOGASTRODUODENOSCOPY (EGD);  Surgeon: Lafayette Dragon, MD;  Location: [facility];  Service: Endoscopy;  Laterality: N/A;  pt asthmatic, RA 92% at rest.  Coughing frequently. Wheezes noted in upper lung fields at auscultation. Pt instructed to use home inhaler prior to procedure and placed on supportive O2 @ 2 lpm in pre-procedural. Teaching done ie. use of inhaler. Pt states she ju  . NISSEN FUNDOPLICATION    . ROTATOR CUFF REPAIR     x3(2 on L, 1 on R)    There were no vitals filed for this visit.      Subjective Assessment - 05/22/16 1505    Subjective Much better this week than she was last week. She has less tightness and pain in the upper trap areas - still tight but much better. Estims "feels good".   Currently in Pain? Yes   Pain Score 4    Pain Location Shoulder   Pain Orientation Right;Left   Pain Descriptors / Indicators Tightness;Aching   Pain Type Chronic pain   Pain Onset More than a month ago   Pain Frequency Constant                         [facility] Adult PT Treatment/Exercise - 05/22/16 0001      Shoulder Exercises: Seated   Other Seated Exercises sitting shoulder shruggs; together and alternating; scap szueeze. scapular abduction with arms at side 5-10 each    Other Seated Exercises chest lift      Moist Heat Therapy   Number Minutes Moist Heat 15 Minutes   Moist Heat Location Shoulder  bilat     Electrical Stimulation   Electrical Stimulation Location bilat shd/trap areas   Electrical Stimulation Action IFC   Electrical Stimulation Parameters to tolerance   Electrical Stimulation Goals Pain;Tone     Manual Therapy   Manual therapy comments pt sitting    Soft tissue mobilization bilat upper traps; leveator; rhomboids; posterior cervical musculature    Myofascial Release upper trap - bilat           Trigger Point Dry Needling - 05/22/16 1525  Consent Given? Yes   Upper Trapezius Response Palpable increased muscle length;Twitch reponse elicited   Levator Scapulae Response Palpable increased muscle length;Twitch response elicited   Rhomboids Response Palpable increased muscle length;Twitch response elicited              PT Education - 05/22/16 1523    Education provided Yes   Education Details TENS info   Person(s) Educated Patient   Methods Explanation   Comprehension Verbalized understanding          PT Short Term Goals - 05/15/16 1011      PT SHORT TERM GOAL #1   Title independent with HEP (06/18/16)   Time 5   Period Weeks   Status New     PT SHORT TERM GOAL #2   Title Improve tolerance for ADL activities to 5-10 min with minimal increase in shoulder pain 06/18/16   Time 5   Period Weeks   Status New     PT  SHORT TERM GOAL #3   Title improve sitting posture and alignment with patient to demonstrated more upright posture head on neck 06/18/16   Time 5   Period Weeks   Status New           PT Long Term Goals - 05/22/16 1528      PT LONG TERM GOAL #1   Title verbalize understanding of posture/body mechanics to decrease neck and shoulder pain/discomfort 07/10/16   Time 8   Period Weeks   Status On-going     PT LONG TERM GOAL #2   Title Improve cervical ROM by 5-8 degrees in lateral flexion and rotation 07/10/16   Time 8   Period Weeks   Status On-going     PT LONG TERM GOAL #3   Title Decrease muscular tightness to palpation through the upper traps/shoulders 07/10/16   Time 8   Period Weeks   Status On-going     PT LONG TERM GOAL #4   Title Improve FOTO to </= 47% limitation 07/10/16   Time 8   Period Weeks   Status On-going               Plan - 05/22/16 1527    Clinical Impression Statement Decreased muscular tightness and improved posture through the upper trap/shoulder area. Pt responded well to e-stim. Provided info TENS for pt for purchase for home use. Gradual progress toward stated goals of therapy.     Rehab Potential Good   PT Frequency 1x / week   PT Duration 8 weeks   PT Treatment/Interventions ADLs/Self Care Home Management;Electrical Stimulation;Cryotherapy;Moist Heat;Traction;Ultrasound;Patient/family education;Therapeutic exercise;Therapeutic activities;Functional mobility training;Stair training;Gait training;Manual techniques;Dry needling;Taping;Vasopneumatic Device   PT Next Visit Plan posture education, TDN/manual work through the upper traps/shoudlers as indicated; TENS for home use    Consulted and Agree with Plan of Care Patient      Patient will benefit from skilled therapeutic intervention in order to improve the following deficits and impairments:  Abnormal gait, Decreased strength, Postural dysfunction, Pain, Decreased activity tolerance,  Decreased balance, Decreased endurance, Decreased mobility, Cardiopulmonary status limiting activity  Visit Diagnosis: Muscle weakness (generalized)  Other symptoms and signs involving the musculoskeletal system  Abnormal posture  Pain in thoracic spine     Problem List Patient Active Problem List   Diagnosis Date Noted  . Osteopenia 02/12/2016  . Lumbar radiculopathy 01/26/2015  . Chronic respiratory failure with hypoxia ([place]) 04/12/2014  . Obesity (BMI 30-39.9) 02/08/2014  . Allergic rhinitis 09/05/2010  . IBS  09/05/2010  . GERD 02/18/2010  . Barrett's esophagus 02/12/2010  . Upper airway cough syndrome 11/02/2009  . Anxiety state 08/29/2008  . History of colonic polyps 08/29/2008  . PULMONARY SARCOIDOSIS 08/12/2007  . Mild persistent asthma in adult without complication with component of vcd 08/12/2007  . Diaphragmatic hernia 08/12/2007  . Migraine 08/12/2007    Henri Guedes Nilda Simmer PT, MPH  May 23, 2016, 3:30 PM  [facility] [address]  [place], [place], [address] Phone: [phone]   Fax:  [phone]  Name: [name] MRN: [id number] Date of Birth: 02-Aug-1951     PHYSICAL THERAPY DISCHARGE SUMMARY  Visits from Start of Care: 9  Current functional level related to goals / functional outcomes: Unable to formally assess due to patient not scheduling additional visits past last date seen. Further PT recommended as of re-cert on last visit.    Remaining deficits: See above   Education / Equipment: HEP  Plan: Patient agrees to discharge.  Patient goals were not met. Patient is being discharged due to not returning since the last visit.  ?????      G-Codes - 2016/05/23    Functional Assessment Tool Used Evaluation; FOTO; clinical assessment    Functional Limitation Changing and maintaining body position   Changing and Maintaining Body Position Goal Status (B9136) At least 40 percent but  less than 60 percent impaired, limited or restricted   Changing and Maintaining Body Position Discahrge Status (U5992) At least 60 percent but less than 80 percent impaired, limited or restricted      Lanney Gins, PT, DPT 07/15/16 3:30 PM

## 2016-05-25 DIAGNOSIS — J45909 Unspecified asthma, uncomplicated: Secondary | ICD-10-CM | POA: Diagnosis not present

## 2016-05-25 DIAGNOSIS — D869 Sarcoidosis, unspecified: Secondary | ICD-10-CM | POA: Diagnosis not present

## 2016-05-29 ENCOUNTER — Ambulatory Visit: Payer: Medicare Other

## 2016-05-30 ENCOUNTER — Ambulatory Visit: Payer: Medicare Other

## 2016-06-03 ENCOUNTER — Ambulatory Visit (INDEPENDENT_AMBULATORY_CARE_PROVIDER_SITE_OTHER): Payer: Medicare Other

## 2016-06-03 DIAGNOSIS — Z23 Encounter for immunization: Secondary | ICD-10-CM | POA: Diagnosis not present

## 2016-06-04 ENCOUNTER — Encounter: Payer: Self-pay | Admitting: *Deleted

## 2016-06-05 ENCOUNTER — Emergency Department (HOSPITAL_BASED_OUTPATIENT_CLINIC_OR_DEPARTMENT_OTHER): Payer: Medicare Other

## 2016-06-05 ENCOUNTER — Observation Stay (HOSPITAL_COMMUNITY): Payer: Medicare Other

## 2016-06-05 ENCOUNTER — Ambulatory Visit: Payer: Medicare Other | Admitting: Rehabilitative and Restorative Service Providers"

## 2016-06-05 ENCOUNTER — Encounter (HOSPITAL_BASED_OUTPATIENT_CLINIC_OR_DEPARTMENT_OTHER): Payer: Self-pay | Admitting: *Deleted

## 2016-06-05 ENCOUNTER — Ambulatory Visit (INDEPENDENT_AMBULATORY_CARE_PROVIDER_SITE_OTHER): Payer: Medicare Other | Admitting: Family Medicine

## 2016-06-05 ENCOUNTER — Observation Stay (HOSPITAL_BASED_OUTPATIENT_CLINIC_OR_DEPARTMENT_OTHER)
Admission: EM | Admit: 2016-06-05 | Discharge: 2016-06-06 | Disposition: A | Discharge status: Home / Self Care | Payer: Medicare Other | Attending: Internal Medicine | Admitting: Internal Medicine

## 2016-06-05 VITALS — BP 106/75 | HR 105 | Temp 98.2°F | Ht 65.0 in | Wt 217.2 lb

## 2016-06-05 DIAGNOSIS — K219 Gastro-esophageal reflux disease without esophagitis: Secondary | ICD-10-CM | POA: Insufficient documentation

## 2016-06-05 DIAGNOSIS — J9621 Acute and chronic respiratory failure with hypoxia: Secondary | ICD-10-CM | POA: Diagnosis not present

## 2016-06-05 DIAGNOSIS — Z6836 Body mass index (BMI) 36.0-36.9, adult: Secondary | ICD-10-CM | POA: Diagnosis not present

## 2016-06-05 DIAGNOSIS — R0902 Hypoxemia: Secondary | ICD-10-CM | POA: Diagnosis not present

## 2016-06-05 DIAGNOSIS — E669 Obesity, unspecified: Secondary | ICD-10-CM | POA: Insufficient documentation

## 2016-06-05 DIAGNOSIS — J4530 Mild persistent asthma, uncomplicated: Secondary | ICD-10-CM | POA: Diagnosis not present

## 2016-06-05 DIAGNOSIS — J9601 Acute respiratory failure with hypoxia: Secondary | ICD-10-CM

## 2016-06-05 DIAGNOSIS — D869 Sarcoidosis, unspecified: Secondary | ICD-10-CM | POA: Diagnosis present

## 2016-06-05 DIAGNOSIS — R0602 Shortness of breath: Secondary | ICD-10-CM | POA: Diagnosis not present

## 2016-06-05 DIAGNOSIS — J8410 Pulmonary fibrosis, unspecified: Secondary | ICD-10-CM | POA: Diagnosis not present

## 2016-06-05 DIAGNOSIS — Z7951 Long term (current) use of inhaled steroids: Secondary | ICD-10-CM | POA: Diagnosis not present

## 2016-06-05 DIAGNOSIS — Z87891 Personal history of nicotine dependence: Secondary | ICD-10-CM | POA: Insufficient documentation

## 2016-06-05 DIAGNOSIS — D860 Sarcoidosis of lung: Secondary | ICD-10-CM | POA: Insufficient documentation

## 2016-06-05 DIAGNOSIS — Z9981 Dependence on supplemental oxygen: Secondary | ICD-10-CM | POA: Insufficient documentation

## 2016-06-05 DIAGNOSIS — Z79899 Other long term (current) drug therapy: Secondary | ICD-10-CM | POA: Diagnosis not present

## 2016-06-05 LAB — BASIC METABOLIC PANEL
Anion gap: 9 (ref 5–15)
BUN: 14 mg/dL (ref 6–20)
CHLORIDE: 103 mmol/L (ref 101–111)
CO2: 27 mmol/L (ref 22–32)
CREATININE: 1.08 mg/dL — AB (ref 0.44–1.00)
Calcium: 9.9 mg/dL (ref 8.9–10.3)
GFR calc non Af Amer: 52 mL/min — ABNORMAL LOW (ref >60)
Glucose, Bld: 83 mg/dL (ref 65–99)
POTASSIUM: 3.7 mmol/L (ref 3.5–5.1)
SODIUM: 139 mmol/L (ref 135–145)

## 2016-06-05 LAB — CBC WITH DIFFERENTIAL/PLATELET
Basophils Absolute: 0 10*3/uL (ref 0.0–0.1)
Basophils Relative: 0 %
EOS ABS: 0.3 10*3/uL (ref 0.0–0.7)
Eosinophils Relative: 5 %
HEMATOCRIT: 46.7 % — AB (ref 36.0–46.0)
HEMOGLOBIN: 15.3 g/dL — AB (ref 12.0–15.0)
LYMPHS ABS: 1.9 10*3/uL (ref 0.7–4.0)
Lymphocytes Relative: 35 %
MCH: 29.9 pg (ref 26.0–34.0)
MCHC: 32.8 g/dL (ref 30.0–36.0)
MCV: 91.4 fL (ref 78.0–100.0)
MONOS PCT: 15 %
Monocytes Absolute: 0.8 10*3/uL (ref 0.1–1.0)
NEUTROS ABS: 2.5 10*3/uL (ref 1.7–7.7)
NEUTROS PCT: 45 %
Platelets: 194 10*3/uL (ref 150–400)
RBC: 5.11 MIL/uL (ref 3.87–5.11)
RDW: 13.2 % (ref 11.5–15.5)
WBC: 5.4 10*3/uL (ref 4.0–10.5)

## 2016-06-05 LAB — TROPONIN I

## 2016-06-05 LAB — D-DIMER, QUANTITATIVE: D-Dimer, Quant: 1.29 ug/mL-FEU — ABNORMAL HIGH (ref 0.00–0.50)

## 2016-06-05 LAB — BRAIN NATRIURETIC PEPTIDE: B NATRIURETIC PEPTIDE 5: 95 pg/mL (ref 0.0–100.0)

## 2016-06-05 MED ORDER — ACETAMINOPHEN 650 MG RE SUPP
650.0000 mg | Freq: Four times a day (QID) | RECTAL | Status: DC | PRN
Start: 2016-06-05 — End: 2016-06-06

## 2016-06-05 MED ORDER — SODIUM CHLORIDE 0.9% FLUSH
3.0000 mL | Freq: Two times a day (BID) | INTRAVENOUS | Status: DC
  Administered 2016-06-06 (×2): 3 mL via INTRAVENOUS

## 2016-06-05 MED ORDER — ONDANSETRON HCL 4 MG/2ML IJ SOLN
4.0000 mg | Freq: Four times a day (QID) | INTRAMUSCULAR | Status: DC | PRN
Start: 2016-06-05 — End: 2016-06-06

## 2016-06-05 MED ORDER — MOMETASONE FURO-FORMOTEROL FUM 200-5 MCG/ACT IN AERO
2.0000 | INHALATION_SPRAY | Freq: Two times a day (BID) | RESPIRATORY_TRACT | Status: DC
  Administered 2016-06-06: 2 via RESPIRATORY_TRACT
  Filled 2016-06-05: qty 8.8

## 2016-06-05 MED ORDER — PANTOPRAZOLE SODIUM 40 MG PO TBEC
40.0000 mg | DELAYED_RELEASE_TABLET | Freq: Every day | ORAL | Status: DC
Start: 2016-06-06 — End: 2016-06-06
  Administered 2016-06-06: 40 mg via ORAL
  Filled 2016-06-05: qty 1

## 2016-06-05 MED ORDER — AMITRIPTYLINE HCL 10 MG PO TABS
10.0000 mg | ORAL_TABLET | Freq: Every day | ORAL | Status: DC
  Administered 2016-06-06: 10 mg via ORAL
  Filled 2016-06-05: qty 1

## 2016-06-05 MED ORDER — ONDANSETRON HCL 4 MG PO TABS: 4.0000 mg | ORAL_TABLET | Freq: Four times a day (QID) | ORAL | Status: DC | PRN

## 2016-06-05 MED ORDER — DOCUSATE SODIUM 100 MG PO CAPS
100.0000 mg | ORAL_CAPSULE | Freq: Two times a day (BID) | ORAL | Status: DC
  Administered 2016-06-06 (×2): 100 mg via ORAL
  Filled 2016-06-05 (×2): qty 1

## 2016-06-05 MED ORDER — ENOXAPARIN SODIUM 40 MG/0.4ML ~~LOC~~ SOLN
40.0000 mg | Freq: Every day | SUBCUTANEOUS | Status: DC
  Administered 2016-06-06: 40 mg via SUBCUTANEOUS
  Filled 2016-06-05: qty 0.4

## 2016-06-05 MED ORDER — IOPAMIDOL (ISOVUE-370) INJECTION 76%
80.0000 mL | Freq: Once | INTRAVENOUS | Status: AC | PRN
  Administered 2016-06-05: 60 mL via INTRAVENOUS

## 2016-06-05 MED ORDER — FAMOTIDINE 20 MG PO TABS
20.0000 mg | ORAL_TABLET | Freq: Two times a day (BID) | ORAL | Status: DC
  Administered 2016-06-06 (×2): 20 mg via ORAL
  Filled 2016-06-05 (×2): qty 1

## 2016-06-05 MED ORDER — METHYLPREDNISOLONE SODIUM SUCC 125 MG IJ SOLR
60.0000 mg | Freq: Two times a day (BID) | INTRAMUSCULAR | Status: DC
  Administered 2016-06-06: 60 mg via INTRAVENOUS
  Filled 2016-06-05: qty 2

## 2016-06-05 MED ORDER — METHYLPREDNISOLONE SODIUM SUCC 125 MG IJ SOLR
125.0000 mg | Freq: Once | INTRAMUSCULAR | Status: AC
  Administered 2016-06-05: 125 mg via INTRAVENOUS
  Filled 2016-06-05: qty 2

## 2016-06-05 MED ORDER — ACETAMINOPHEN 325 MG PO TABS
650.0000 mg | ORAL_TABLET | Freq: Four times a day (QID) | ORAL | Status: DC | PRN
Start: 2016-06-05 — End: 2016-06-06
  Administered 2016-06-06: 650 mg via ORAL
  Filled 2016-06-05: qty 2

## 2016-06-05 MED ORDER — IOPAMIDOL (ISOVUE-370) INJECTION 76%
INTRAVENOUS | Status: AC
  Filled 2016-06-05: qty 100

## 2016-06-05 MED ORDER — ALBUTEROL SULFATE (2.5 MG/3ML) 0.083% IN NEBU
5.0000 mg | INHALATION_SOLUTION | Freq: Once | RESPIRATORY_TRACT | Status: AC
  Administered 2016-06-05: 5 mg via RESPIRATORY_TRACT
  Filled 2016-06-05: qty 6

## 2016-06-05 MED ORDER — IPRATROPIUM-ALBUTEROL 0.5-2.5 (3) MG/3ML IN SOLN
3.0000 mL | Freq: Four times a day (QID) | RESPIRATORY_TRACT | Status: DC
  Administered 2016-06-06 (×2): 3 mL via RESPIRATORY_TRACT
  Filled 2016-06-05 (×3): qty 3

## 2016-06-05 MED ORDER — CYCLOSPORINE 0.05 % OP EMUL
1.0000 [drp] | Freq: Two times a day (BID) | OPHTHALMIC | Status: DC
  Administered 2016-06-06 (×2): 1 [drp] via OPHTHALMIC
  Filled 2016-06-05 (×2): qty 1

## 2016-06-05 MED ORDER — ALBUTEROL SULFATE (2.5 MG/3ML) 0.083% IN NEBU
2.5000 mg | INHALATION_SOLUTION | RESPIRATORY_TRACT | Status: DC | PRN
  Administered 2016-06-05: 2.5 mg via RESPIRATORY_TRACT
  Filled 2016-06-05: qty 3

## 2016-06-05 MED ORDER — ALBUTEROL SULFATE (2.5 MG/3ML) 0.083% IN NEBU: 2.5000 mg | INHALATION_SOLUTION | RESPIRATORY_TRACT | Status: DC | PRN

## 2016-06-05 MED ORDER — IOPAMIDOL (ISOVUE-370) INJECTION 76%
100.0000 mL | Freq: Once | INTRAVENOUS | Status: AC | PRN
  Administered 2016-06-05: 100 mL via INTRAVENOUS

## 2016-06-05 MED ORDER — LORAZEPAM 0.5 MG PO TABS: 0.5000 mg | ORAL_TABLET | Freq: Four times a day (QID) | ORAL | Status: DC | PRN

## 2016-06-05 NOTE — ED Triage Notes (Signed)
Pt is here for low oxygen saturation.  Pt is on 2L [place] at home and monitors her spo2 at home.  Pt is able to speak in full sentences and is not in any acute distress.  Pt states that she had a flu shot around 2:30 on Tuesday and after this her breathing became worse.  She reports exertional dyspnea that has increased.  Breathing a little different from baseline at rest (a little labored per pt) but the SOB is mostly with exertion and any activity.  No fever with this.  Pt cane to see her PCP and she was sent down to ED due to this after she saw her for an appointment.

## 2016-06-05 NOTE — Progress Notes (Signed)
[facility] [address] [place], [place] [address] [phone]  Date:  06/05/2016   Name:  [name]   DOB:  06-Nov-1950   MRN:  [id number]  PCP:  Abbe Amsterdam, MD    Chief Complaint: Shortness of Breath (c/o oxygen level dropping when pt is up moving around. )   History of Present Illness:  [name] is a 65 y.o. very pleasant female patient who presents with the following:  Here today with concern about her breathing; she has a history of pulmonary sarcoidosis  She is also a pt of Dr. Sherene Sires for her pulmonary concerns.  She has pulm sarcoidosis. She is on oxygen via [place]- 2 or 3L depending on her activity level. She is on this 24 hours a day- has done so for 2 years now.   2 days ago she got her flu shot.  She is not sure if the two events are connected, but yesterday her "oxygen started to drop."  She notes that since yesterday am she did not feel right- she checked her O2 sat and it was dropping into the 80s all day yesterday.  As long as she is still, her oxygen will be normal in the 90s but even minimal activity will drop her into the 80s. When she walked from her car into our lobby just now she measured her sat at 66%. She felt very short of breath and like her oxygen was low, she had to stop and rest.   She is normally on 2 liters- last night she turned it up to 3L and tried to keep herself calm.  She made it through the night and decided to come in today  She has not noted any fever. No particular cough.  She has noted the same problem with her portable and home machines so she does not think it is her equipment.    She has been on oxygen for about 2 years now. Overall she has done well with this and has adjusted to using oxygen well Patient Active Problem List   Diagnosis Date Noted  . Osteopenia 02/12/2016  . Lumbar radiculopathy 01/26/2015  . Chronic respiratory failure with hypoxia (HCC)  04/12/2014  . Obesity (BMI 30-39.9) 02/08/2014  . Allergic rhinitis 09/05/2010  . IBS 09/05/2010  . GERD 02/18/2010  . Barrett's esophagus 02/12/2010  . Upper airway cough syndrome 11/02/2009  . Anxiety state 08/29/2008  . History of colonic polyps 08/29/2008  . PULMONARY SARCOIDOSIS 08/12/2007  . Mild persistent asthma in adult without complication with component of vcd 08/12/2007  . Diaphragmatic hernia 08/12/2007  . Migraine 08/12/2007    Past Medical History:  Diagnosis Date  . Anxiety   . Asthma   . Barrett esophagus   . GERD (gastroesophageal reflux disease)   . Hiatal hernia   . IBS (irritable bowel syndrome)   . Migraines   . Pulmonary sarcoidosis [facility])     Past Surgical History:  Procedure Laterality Date  . BRAVO PH STUDY N/A 10/10/2013   Procedure: BRAVO PH STUDY;  Surgeon: Hart Carwin, MD;  [facility];  Service: Endoscopy;  Laterality: N/A;  placed 29  . BRONCHIAL BIOPSY  1996  . CATARACT EXTRACTION, BILATERAL    . CHOLECYSTECTOMY    . ESOPHAGOGASTRODUODENOSCOPY N/A 10/10/2013   Procedure: ESOPHAGOGASTRODUODENOSCOPY (EGD);  Surgeon: Hart Carwin, MD;  Location: [facility];  Service: Endoscopy;  Laterality: N/A;  pt asthmatic,  RA 92% at rest.  Coughing frequently. Wheezes noted in upper lung fields at auscultation. Pt instructed to use home inhaler prior to procedure and placed on supportive O2 @ 2 lpm in pre-procedural. Teaching done ie. use of inhaler. Pt states she ju  . NISSEN FUNDOPLICATION    . ROTATOR CUFF REPAIR     x3(2 on L, 1 on R)    Social History  Substance Use Topics  . Smoking status: Former Smoker    Packs/day: 1.00    Years: 20.00    Types: Cigarettes    Quit date: 09/08/1978  . Smokeless tobacco: Never Used  . Alcohol use No    Family History  Problem Relation Age of Onset  . Adopted: Yes    Allergies  Allergen Reactions  . Gabapentin     Edema on 2 separate trials  . Penicillins Other (See Comments)    Unknown -  childhood allergy  . Aspirin Other (See Comments)    gastritis    Medication list has been reviewed and updated.  Current Outpatient Prescriptions on File Prior to Visit  Medication Sig Dispense Refill  . amitriptyline (ELAVIL) 10 MG tablet Take 1 tablet (10 mg total) by mouth at bedtime. 90 tablet 3  . Ascorbic Acid (VITAMIN C) 1000 MG tablet Take 1,000 mg by mouth every morning.     . ASTRAGALUS PO Take 1 tablet by mouth every morning.     . budesonide-formoterol (SYMBICORT) 160-4.5 MCG/ACT inhaler Inhale 2 puffs into the lungs 2 (two) times daily as needed. 1 Inhaler 3  . Cyanocobalamin (VITAMIN B-12) 5000 MCG TBDP Take 5,000 mcg by mouth daily.    . cycloSPORINE (RESTASIS) 0.05 % ophthalmic emulsion Place 1 drop into both eyes 2 (two) times daily.     . furosemide (LASIX) 20 MG tablet Take 1 tablet (20 mg total) by mouth daily. Use as needed for swelling 90 tablet 1  . LORazepam (ATIVAN) 0.5 MG tablet Take 1/2- 1 tablet by mouth every 8-12 hours prn only 30 tablet 0  . Multiple Vitamin (MULTIVITAMIN) capsule Take 1 capsule by mouth every morning.     . Nasal Moisturizer Combination (RHINASE) SOLN Place 2 sprays into the nose 2 (two) times daily.    . pantoprazole (PROTONIX) 40 MG tablet TAKE 1 TABLET (40 MG TOTAL) BY MOUTH DAILY. TAKE 30-60 MIN BEFORE FIRST MEAL OF THE DAY 30 tablet 2  . POTASSIUM PO Take 1 tablet by mouth daily.    . ranitidine (ZANTAC) 150 MG tablet Take 1 tablet (150 mg total) by mouth 2 (two) times daily. 120 tablet 3   No current facility-administered medications on file prior to visit.     Review of Systems:  As per HPI- otherwise negative.   Physical Examination:  Vitals:   06/05/16 1341  Weight: 217 lb 3.2 oz (98.5 kg)   Body mass index is 36.14 kg/m. Ideal Body Weight:    GEN: WDWN, NAD, Non-toxic, A & O x 3, looks well, wearing oxygen with portable concentrator, Christine HEENT: Atraumatic, Normocephalic. Neck supple. No masses, No LAD. Ears and  Nose: No external deformity. CV: tachycardic, No M/G/R. No JVD. No thrill. No extra heart sounds. PULM: CTA B, no wheezes, crackles, rhonchi. No retractions. No resp. distress. No accessory muscle use. EXTR: No c/c/e NEURO Normal gait.  PSYCH: Normally interactive. Conversant. Not depressed or anxious appearing.  Calm demeanor.  Used our sat monitor and had pt walk about 75 yards.  Her oxygen dropped  to 82%, and then to 70 % upon sitting down in the room.  She was quite SOB, rested and her saturation recovered   Assessment and Plan: Sarcoidosis (HCC)  Hypoxemia  Oxygen desaturation  Here today with hypoxemia and desats into the 70s/80- this started yesterday, etiology unclear.  [facility]. Appreciate ER care of this nice patient.   Signed Abbe AmsterdamJessica Claudene Gatliff, MD

## 2016-06-05 NOTE — Progress Notes (Signed)
Pre visit review using our clinic review tool, if applicable. No additional management support is needed unless otherwise documented below in the visit note. 

## 2016-06-05 NOTE — ED Notes (Signed)
Patient transported to CT 

## 2016-06-05 NOTE — ED Notes (Signed)
Over to CT r/t to inability for IVF to be pushed through the IV and pain at the IV site. Repositioned and no blood returned and patient unable to tolerate a flush

## 2016-06-05 NOTE — ED Provider Notes (Signed)
[facility] Provider Note   CSN: [id number] Arrival date & time: 06/05/16  1420     History   Chief Complaint Chief Complaint  Patient presents with  . Shortness of Breath    HPI [name] is a 65 y.o. female.  65 yo F with a chief complaint of shortness of breath. This been going on for the past couple days and significantly worsening. Patient is on 2 L of home O2 for sarcoidosis. She went and saw her family physician today because she was unable to even brush her teeth because it took her breath away. She denies prior history of PE or DVT. Denies any prolonged travel. Her family physician increase her to 3 L of oxygen and send her down to be seen. The patient has a chronic cough but did not feel that it worsened in character. She has sputum at baseline but has not increased or changed character.   The history is provided by the patient.  Shortness of Breath  This is a recurrent problem. The average episode lasts 2 days. The problem occurs continuously.The current episode started less than 1 hour ago. The problem has been rapidly worsening. Pertinent negatives include no fever, no headaches, no rhinorrhea, no wheezing, no chest pain and no vomiting. She has tried oral steroids and beta-agonist inhalers for the symptoms. The treatment provided no relief. She has had prior hospitalizations. She has had prior ED visits. Associated medical issues include chronic lung disease.    Past Medical History:  Diagnosis Date  . Anxiety   . Asthma   . Barrett esophagus   . GERD (gastroesophageal reflux disease)   . Hiatal hernia   . IBS (irritable bowel syndrome)   . Migraines   . Pulmonary sarcoidosis [facility])     Patient Active Problem List   Diagnosis Date Noted  . SOB (shortness of breath) 06/05/2016  . Osteopenia 02/12/2016  . Lumbar radiculopathy 01/26/2015  . Chronic respiratory failure with hypoxia (HCC) 04/12/2014  . Obesity (BMI 30-39.9) 02/08/2014  .  Allergic rhinitis 09/05/2010  . IBS 09/05/2010  . GERD 02/18/2010  . Barrett's esophagus 02/12/2010  . Upper airway cough syndrome 11/02/2009  . Anxiety state 08/29/2008  . History of colonic polyps 08/29/2008  . PULMONARY SARCOIDOSIS 08/12/2007  . Mild persistent asthma in adult without complication with component of vcd 08/12/2007  . Diaphragmatic hernia 08/12/2007  . Migraine 08/12/2007    Past Surgical History:  Procedure Laterality Date  . BRAVO PH STUDY N/A 10/10/2013   Procedure: BRAVO PH STUDY;  Surgeon: Hart Carwin, MD;  Location: [facility] ENDOS[facility];  Service: Endoscopy;  Laterality: N/A;  placed 27  . BRONCHIAL BIOPSY  1996  . CATARACT EXTRACTION, BILATERAL    . CHOLECYSTECTOMY    . ESOPHAGOGASTRODUODENOSCOPY N/A 10/10/2013   Procedure: ESOPHAGOGASTRODUODENOSCOPY (EGD);  Surgeon: Hart Carwin, MD;  Location: [facility];  Service: Endoscopy;  Laterality: N/A;  pt asthmatic, RA 92% at rest.  Coughing frequently. Wheezes noted in upper lung fields at auscultation. Pt instructed to use home inhaler prior to procedure and placed on supportive O2 @ 2 lpm in pre-procedural. Teaching done ie. use of inhaler. Pt states she ju  . NISSEN FUNDOPLICATION    . ROTATOR CUFF REPAIR     x3(2 on L, 1 on R)    OB History    No data available       Home Medications    Prior to Admission medications   Medication Sig Start  Date End Date Taking? Authorizing Provider  amitriptyline (ELAVIL) 10 MG tablet Take 1 tablet (10 mg total) by mouth at bedtime. 02/07/16   Pearline Cables, MD  Ascorbic Acid (VITAMIN C) 1000 MG tablet Take 1,000 mg by mouth every morning.     Historical Provider, MD  ASTRAGALUS PO Take 1 tablet by mouth every morning.     Historical Provider, MD  budesonide-formoterol (SYMBICORT) 160-4.5 MCG/ACT inhaler Inhale 2 puffs into the lungs 2 (two) times daily as needed. 10/31/15   Nyoka Cowden, MD  Collagen Hydrolysate POWD 1 scoop by Does not apply route daily.    Historical  Provider, MD  Cyanocobalamin (VITAMIN B-12) 5000 MCG TBDP Take 5,000 mcg by mouth daily.    Historical Provider, MD  cycloSPORINE (RESTASIS) 0.05 % ophthalmic emulsion Place 1 drop into both eyes 2 (two) times daily.     Historical Provider, MD  furosemide (LASIX) 20 MG tablet Take 1 tablet (20 mg total) by mouth daily. Use as needed for swelling 02/07/16   Gwenlyn Found Copland, MD  LORazepam (ATIVAN) 0.5 MG tablet Take 1/2- 1 tablet by mouth every 8-12 hours prn only 02/07/16   Pearline Cables, MD  Multiple Vitamin (MULTIVITAMIN) capsule Take 1 capsule by mouth every morning.     Historical Provider, MD  Nasal Moisturizer Combination (RHINASE) SOLN Place 2 sprays into the nose 2 (two) times daily.    Historical Provider, MD  pantoprazole (PROTONIX) 40 MG tablet TAKE 1 TABLET (40 MG TOTAL) BY MOUTH DAILY. TAKE 30-60 MIN BEFORE FIRST MEAL OF THE DAY 08/27/15   Nyoka Cowden, MD  POTASSIUM PO Take 1 tablet by mouth daily.    Historical Provider, MD  PREBIOTIC PRODUCT PO Take 1 scoop by mouth daily.    Historical Provider, MD  ranitidine (ZANTAC) 150 MG tablet Take 1 tablet (150 mg total) by mouth 2 (two) times daily. 12/07/15   Ruffin Frederick, MD    Family History Family History  Problem Relation Age of Onset  . Adopted: Yes    Social History Social History  Substance Use Topics  . Smoking status: Former Smoker    Packs/day: 1.00    Years: 20.00    Types: Cigarettes    Quit date: 09/08/1978  . Smokeless tobacco: Never Used  . Alcohol use No     Allergies   Gabapentin; Penicillins; and Aspirin   Review of Systems Review of Systems  Constitutional: Negative for chills and fever.  HENT: Negative for congestion and rhinorrhea.   Eyes: Negative for redness and visual disturbance.  Respiratory: Positive for shortness of breath. Negative for wheezing.   Cardiovascular: Negative for chest pain and palpitations.  Gastrointestinal: Negative for nausea and vomiting.  Genitourinary:  Negative for dysuria and urgency.  Musculoskeletal: Negative for arthralgias and myalgias.  Skin: Negative for pallor and wound.  Neurological: Positive for weakness. Negative for dizziness and headaches.     Physical Exam Updated Vital Signs BP 108/81 (BP Location: Left Arm)   Pulse (!) 113   Temp 98.7 F (37.1 C) (Oral)   Resp 18   Ht 5\' 4"  (1.626 m)   Wt 213 lb 12.8 oz (97 kg) Comment: c scale  SpO2 98%   BMI 36.70 kg/m   Physical Exam  Constitutional: She is oriented to person, place, and time. She appears well-developed and well-nourished. No distress.  HENT:  Head: Normocephalic and atraumatic.  Eyes: EOM are normal. Pupils are equal, round, and reactive to light.  Neck: Normal range of motion. Neck supple.  Cardiovascular: Normal rate and regular rhythm.  Exam reveals no gallop and no friction rub.   No murmur heard. Pulmonary/Chest: Effort normal. She has no wheezes. She has no rales.  Abdominal: Soft. She exhibits no distension and no mass. There is no tenderness. There is no guarding.  Musculoskeletal: She exhibits edema (1+ bilaterally). She exhibits no tenderness.  Neurological: She is alert and oriented to person, place, and time.  Skin: Skin is warm and dry. She is not diaphoretic.  Psychiatric: She has a normal mood and affect. Her behavior is normal.  Nursing note and vitals reviewed.    ED Treatments / Results  Labs (all labs ordered are listed, but only abnormal results are displayed) Labs Reviewed  CBC WITH DIFFERENTIAL/PLATELET - Abnormal; Notable for the following:       Result Value   Hemoglobin 15.3 (*)    HCT 46.7 (*)    All other components within normal limits  BASIC METABOLIC PANEL - Abnormal; Notable for the following:    Creatinine, Ser 1.08 (*)    GFR calc non Af Amer 52 (*)    All other components within normal limits  D-DIMER, QUANTITATIVE (NOT AT [facility]) - Abnormal; Notable for the following:    D-Dimer, Quant 1.29 (*)    All other  components within normal limits  TROPONIN I  BRAIN NATRIURETIC PEPTIDE  BASIC METABOLIC PANEL  CBC    EKG  EKG Interpretation  Date/Time:  Thursday June 05 2016 14:55:23 EDT Ventricular Rate:  97 PR Interval:    QRS Duration: 94 QT Interval:  342 QTC Calculation: 435 R Axis:   67 Text Interpretation:  Sinus rhythm No significant change since last tracing Confirmed by Vonceil Upshur MD, DANIEL [phone]) on 06/05/2016 3:57:23 PM       Radiology Dg Chest 2 View  Result Date: 06/05/2016 CLINICAL DATA:  Dyspnea since Tuesday. History of pulmonary sarcoid. EXAM: CHEST  2 VIEW COMPARISON:  Chest radiograph from 04/04/2015 and chest CT from 03/07/2014 FINDINGS: The heart size and mediastinal contours are within normal limits. Lower lobe predominant interstitial pulmonary fibrosis is without significant change. There are coarse reticular and interstitial markings in the upper lobes with bronchiectasis seen. Some mild pulmonary vascular congestion since prior chest radiograph. Stable right hilar and right paratracheal stripe prominence suggestive of adenopathy. No effusion or pneumothorax. Surgical anchors in the right humeral head. IMPRESSION: Interstitial pulmonary fibrosis with right hilar and right peritracheal adenopathy consistent with known pulmonary sarcoid. Slight increase in vascular congestion relative to previous 2016 chest radiograph. Electronically Signed   By: Tollie Eth M.D.   On: 06/05/2016 16:27    Procedures Procedures (including critical care time)   Procedure note: Ultrasound Guided Peripheral IV Ultrasound guided peripheral 1.88 inch angiocath IV placement performed by me. Indications: Nursing unable to place IV. Details: The antecubital fossa and upper arm were evaluated with a multifrequency linear probe. Patent brachial veins were noted. 1 attempt was made to cannulate a vein under realtime US guidance with successful cannulation of the vein and catheter placement. There is  return of non-pulsatile dark red blood. The patient tolerated the procedure well without complications. Images archived electronically.  CPT codes: 60454 and [phone]  Medications Ordered in ED Medications  amitriptyline (ELAVIL) tablet 10 mg (not administered)  LORazepam (ATIVAN) tablet 0.5 mg (not administered)  famotidine (PEPCID) tablet 20 mg (not administered)  mometasone-formoterol (DULERA) 200-5 MCG/ACT inhaler 2 puff (not administered)  pantoprazole (PROTONIX)  EC tablet 40 mg (not administered)  cycloSPORINE (RESTASIS) 0.05 % ophthalmic emulsion 1 drop (not administered)  enoxaparin (LOVENOX) injection 40 mg (not administered)  acetaminophen (TYLENOL) tablet 650 mg (not administered)    Or  acetaminophen (TYLENOL) suppository 650 mg (not administered)  docusate sodium (COLACE) capsule 100 mg (not administered)  ondansetron (ZOFRAN) tablet 4 mg (not administered)    Or  ondansetron (ZOFRAN) injection 4 mg (not administered)  sodium chloride flush (NS) 0.9 % injection 3 mL (not administered)  ipratropium-albuterol (DUONEB) 0.5-2.5 (3) MG/3ML nebulizer solution 3 mL (not administered)  albuterol (PROVENTIL) (2.5 MG/3ML) 0.083% nebulizer solution 2.5 mg (not administered)  methylPREDNISolone sodium succinate (SOLU-MEDROL) 125 mg/2 mL injection 60 mg (not administered)  iopamidol (ISOVUE-370) 76 % injection (not administered)  albuterol (PROVENTIL) (2.5 MG/3ML) 0.083% nebulizer solution 5 mg (5 mg Nebulization Given 06/05/16 1553)  iopamidol (ISOVUE-370) 76 % injection 100 mL (100 mLs Intravenous Contrast Given 06/05/16 1710)  methylPREDNISolone sodium succinate (SOLU-MEDROL) 125 mg/2 mL injection 125 mg (125 mg Intravenous Given 06/05/16 2011)     Initial Impression / Assessment and Plan / ED Course  I have reviewed the triage vital signs and the nursing notes.  Pertinent labs & imaging results that were available during my care of the patient were reviewed by me and considered in my  medical decision making (see chart for details).  Clinical Course    65 yo F With a chief complaint of shortness breath on exertion. Patient has sarcoidosis and is on 2 L of oxygen at baseline. I'm concerned that the patient may have a PE as she has significant hypoxia going down to the 70s but just a few steps. Will obtain a CT angiogram chest.  Unable to obtain CT scan, after multiple iv starts.  I discussed with the hospitalist [facility].  The patients results and plan were reviewed and discussed.   Any x-rays performed were independently reviewed by myself.   Differential diagnosis were considered with the presenting HPI.  Medications  amitriptyline (ELAVIL) tablet 10 mg (not administered)  LORazepam (ATIVAN) tablet 0.5 mg (not administered)  famotidine (PEPCID) tablet 20 mg (not administered)  mometasone-formoterol (DULERA) 200-5 MCG/ACT inhaler 2 puff (not administered)  pantoprazole (PROTONIX) EC tablet 40 mg (not administered)  cycloSPORINE (RESTASIS) 0.05 % ophthalmic emulsion 1 drop (not administered)  enoxaparin (LOVENOX) injection 40 mg (not administered)  acetaminophen (TYLENOL) tablet 650 mg (not administered)    Or  acetaminophen (TYLENOL) suppository 650 mg (not administered)  docusate sodium (COLACE) capsule 100 mg (not administered)  ondansetron (ZOFRAN) tablet 4 mg (not administered)    Or  ondansetron (ZOFRAN) injection 4 mg (not administered)  sodium chloride flush (NS) 0.9 % injection 3 mL (not administered)  ipratropium-albuterol (DUONEB) 0.5-2.5 (3) MG/3ML nebulizer solution 3 mL (not administered)  albuterol (PROVENTIL) (2.5 MG/3ML) 0.083% nebulizer solution 2.5 mg (not administered)  methylPREDNISolone sodium succinate (SOLU-MEDROL) 125 mg/2 mL injection 60 mg (not administered)  iopamidol (ISOVUE-370) 76 % injection (not administered)  albuterol (PROVENTIL) (2.5 MG/3ML) 0.083% nebulizer solution 5 mg (5 mg Nebulization Given  06/05/16 1553)  iopamidol (ISOVUE-370) 76 % injection 100 mL (100 mLs Intravenous Contrast Given 06/05/16 1710)  methylPREDNISolone sodium succinate (SOLU-MEDROL) 125 mg/2 mL injection 125 mg (125 mg Intravenous Given 06/05/16 2011)    Vitals:   06/05/16 2014 06/05/16 2208 06/05/16 2218 06/05/16 2221  BP: 143/82   108/81  Pulse: 113   (!) 113  Resp: (!) 27   18  Temp:    98.7 F (37.1 C)  TempSrc:    Oral  SpO2: 98%  97% 98%  Weight:    213 lb 12.8 oz (97 kg)  Height:  5\' 4"  (1.626 m)  5\' 4"  (1.626 m)    Final diagnoses:  SOB (shortness of breath)    Admission/ observation were discussed with the admitting physician, patient and/or family and they are comfortable with the plan.    Final Clinical Impressions(s) / ED Diagnoses   Final diagnoses:  SOB (shortness of breath)    New Prescriptions Current Discharge Medication List       Melene Plan, DO 06/05/16 2322

## 2016-06-05 NOTE — ED Notes (Signed)
Report to the Floor

## 2016-06-05 NOTE — H&P (Signed)
History and Physical    [name] [name] DOB: 1951-05-14 DOA: 06/05/2016  PCP: Abbe Amsterdam, MD Consultants:  Wort - pulmonary; Armbruster - GI; Digby - eye; Gaynell Face - GYN (retired) Patient coming from: home - lives with husband  Chief Complaint: SOB  HPI: [name] is a 65 y.o. female with medical history significant of pulmonary sarcoidosis, asthma presenting with SOB and hypoxia on exertion. Patient thought she was fine until she got her flu shot Tuesday afternoon.  Breathing felt funny that day.  Went to bed and awoke Wednesday AM and felt woozy and very SOB.  O2 sats were in 70s and she had not been ambulating a long distance.  Sat down and sats improved to 97%.  She got up and moved around again and it would drop throughout the day Wednesday.  That night, it dropped into the 60s and she couldn't get it back up.  She got anxious but she laid down and it seemed to improve.  This AM when she woke up, sats were 97% and felt ok.  She got up and walked around and felt bad again.  She called PCP and went in that afternoon for appt.  Walking from parking lot into her office, O2 sat dropped into 60s.  Rested with improvement and then dropped back to 71% in reception area.  Dropped again walking to exam room, to bathroom, etc.  They changed to the physician's O2 meter (has been using her home meter) and it was the same.  She walked one lap with the doctor and she was quite out of breath and the sats dropped into 60s and would not come back up.  She was wheeled down to [facility].  They were unable to get CT of chest because couldn't get IV access.  +cough x 5 years, productive of yellow sputum.  Occasional wheezing, worse the last 2 days.  Also with sinus problems, occasional nosebleeds.  Wears 2L O2 at home, increases to 3L with exertion  ED Course: Per Dr. Adela Lank: 65 yo F With a chief complaint of shortness breath on exertion. Patient has sarcoidosis and is on 2 L of oxygen at  baseline. I'm concerned that the patient may have a PE as she has significant hypoxia going down to the 70s but just a few steps. Will obtain a CT angiogram chest.  Unable to obtain CT scan, after multiple iv starts.  I discussed with the hospitalist will transfer to [facility] for admission.  Review of Systems: As per HPI; otherwise 10 point review of systems reviewed and negative.   Ambulatory Status:  Ambulates without assistance  Past Medical History:  Diagnosis Date  . Anxiety   . Asthma   . Barrett esophagus   . GERD (gastroesophageal reflux disease)   . Hiatal hernia   . IBS (irritable bowel syndrome)   . Migraines   . Pulmonary sarcoidosis [facility])     Past Surgical History:  Procedure Laterality Date  . BRAVO PH STUDY N/A 10/10/2013   Procedure: BRAVO PH STUDY;  Surgeon: Hart Carwin, MD;  Location: [facility];  Service: Endoscopy;  Laterality: N/A;  placed 27  . BRONCHIAL BIOPSY  1996  . CATARACT EXTRACTION, BILATERAL    . CHOLECYSTECTOMY    . ESOPHAGOGASTRODUODENOSCOPY N/A 10/10/2013   Procedure: ESOPHAGOGASTRODUODENOSCOPY (EGD);  Surgeon: Hart Carwin, MD;  Location: [facility];  Service: Endoscopy;  Laterality: N/A;  pt asthmatic, RA 92% at rest.  Coughing frequently. Wheezes noted in upper lung  fields at auscultation. Pt instructed to use home inhaler prior to procedure and placed on supportive O2 @ 2 lpm in pre-procedural. Teaching done ie. use of inhaler. Pt states she ju  . NISSEN FUNDOPLICATION    . ROTATOR CUFF REPAIR     x3(2 on L, 1 on R)    Social History   Social History  . Marital status: Married    Spouse name: N/A  . Number of children: N/A  . Years of education: N/A   Occupational History  . Semi RetiredWater engineer at [employer] History Main Topics  . Smoking status: Former Smoker    Packs/day: 1.00    Years: 20.00    Types: Cigarettes    Quit date: 09/08/1978  . Smokeless tobacco: Never Used  . Alcohol use No  . Drug use: No  .  Sexual activity: Not on file   Other Topics Concern  . Not on file   Social History Narrative  . No narrative on file    Allergies  Allergen Reactions  . Gabapentin     Edema on 2 separate trials  . Penicillins Other (See Comments)    Unknown - childhood allergy  . Aspirin Other (See Comments)    gastritis    Family History  Problem Relation Age of Onset  . Adopted: Yes    Prior to Admission medications   Medication Sig Start Date End Date Taking? Authorizing Provider  amitriptyline (ELAVIL) 10 MG tablet Take 1 tablet (10 mg total) by mouth at bedtime. 02/07/16   Pearline Cables, MD  Ascorbic Acid (VITAMIN C) 1000 MG tablet Take 1,000 mg by mouth every morning.     Historical Provider, MD  ASTRAGALUS PO Take 1 tablet by mouth every morning.     Historical Provider, MD  budesonide-formoterol (SYMBICORT) 160-4.5 MCG/ACT inhaler Inhale 2 puffs into the lungs 2 (two) times daily as needed. 10/31/15   Nyoka Cowden, MD  Collagen Hydrolysate POWD 1 scoop by Does not apply route daily.    Historical Provider, MD  Cyanocobalamin (VITAMIN B-12) 5000 MCG TBDP Take 5,000 mcg by mouth daily.    Historical Provider, MD  cycloSPORINE (RESTASIS) 0.05 % ophthalmic emulsion Place 1 drop into both eyes 2 (two) times daily.     Historical Provider, MD  furosemide (LASIX) 20 MG tablet Take 1 tablet (20 mg total) by mouth daily. Use as needed for swelling 02/07/16   Gwenlyn Found Copland, MD  LORazepam (ATIVAN) 0.5 MG tablet Take 1/2- 1 tablet by mouth every 8-12 hours prn only 02/07/16   Pearline Cables, MD  Multiple Vitamin (MULTIVITAMIN) capsule Take 1 capsule by mouth every morning.     Historical Provider, MD  Nasal Moisturizer Combination (RHINASE) SOLN Place 2 sprays into the nose 2 (two) times daily.    Historical Provider, MD  pantoprazole (PROTONIX) 40 MG tablet TAKE 1 TABLET (40 MG TOTAL) BY MOUTH DAILY. TAKE 30-60 MIN BEFORE FIRST MEAL OF THE DAY 08/27/15   Nyoka Cowden, MD  POTASSIUM PO  Take 1 tablet by mouth daily.    Historical Provider, MD  PREBIOTIC PRODUCT PO Take 1 scoop by mouth daily.    Historical Provider, MD  ranitidine (ZANTAC) 150 MG tablet Take 1 tablet (150 mg total) by mouth 2 (two) times daily. 12/07/15   Ruffin Frederick, MD    Physical Exam: Vitals:   06/05/16 2014 06/05/16 2208 06/05/16 2218 06/05/16 2221  BP: 143/82  108/81  Pulse: 113   (!) 113  Resp: (!) 27   18  Temp:    98.7 F (37.1 C)  TempSrc:    Oral  SpO2: 98%  97% 98%  Weight:    97 kg (213 lb 12.8 oz)  Height:  5\' 4"  (1.626 m)  5\' 4"  (1.626 m)     General: Appears calm and comfortable and is NAD Eyes:  PERRL, EOMI, normal lids, iris ENT:  grossly normal hearing, lips & tongue, mmm Neck:  no LAD, masses or thyromegaly Cardiovascular:  Tachycardia, no m/r/g. No LE edema.  Respiratory:  CTA bilaterally, no w/r/r. Normal respiratory effort. Abdomen:  soft, ntnd, NABS Skin:  no rash or induration seen on limited exam Musculoskeletal:  grossly normal tone BUE/BLE, good ROM, no bony abnormality Psychiatric:  grossly normal mood and affect, speech fluent and appropriate, AOx3 Neurologic:  CN 2-12 grossly intact, moves all extremities in coordinated fashion, sensation intact  Labs on Admission: I have personally reviewed following labs and imaging studies  CBC:  Recent Labs Lab 06/05/16 1520  WBC 5.4  NEUTROABS 2.5  HGB 15.3*  HCT 46.7*  MCV 91.4  PLT 194   Basic Metabolic Panel:  Recent Labs Lab 06/05/16 1520  NA 139  K 3.7  CL 103  CO2 27  GLUCOSE 83  BUN 14  CREATININE 1.08*  CALCIUM 9.9   GFR: Estimated Creatinine Clearance: 57.1 mL/min (by C-G formula based on SCr of 1.08 mg/dL (H)). Liver Function Tests: No results for input(s): AST, ALT, ALKPHOS, BILITOT, PROT, ALBUMIN in the last 168 hours. No results for input(s): LIPASE, AMYLASE in the last 168 hours. No results for input(s): AMMONIA in the last 168 hours. Coagulation Profile: No results for  input(s): INR, PROTIME in the last 168 hours. Cardiac Enzymes:  Recent Labs Lab 06/05/16 1520  TROPONINI <0.03   BNP (last 3 results) No results for input(s): PROBNP in the last 8760 hours. HbA1C: No results for input(s): HGBA1C in the last 72 hours. CBG: No results for input(s): GLUCAP in the last 168 hours. Lipid Profile: No results for input(s): CHOL, HDL, LDLCALC, TRIG, CHOLHDL, LDLDIRECT in the last 72 hours. Thyroid Function Tests: No results for input(s): TSH, T4TOTAL, FREET4, T3FREE, THYROIDAB in the last 72 hours. Anemia Panel: No results for input(s): VITAMINB12, FOLATE, FERRITIN, TIBC, IRON, RETICCTPCT in the last 72 hours. Urine analysis:    Component Value Date/Time   BILIRUBINUR Negaitve 03/20/2016 1158   PROTEINUR negative 03/20/2016 1158   UROBILINOGEN negative 03/20/2016 1158   NITRITE negative 03/20/2016 1158   LEUKOCYTESUR Negative 03/20/2016 1158    Creatinine Clearance: Estimated Creatinine Clearance: 57.1 mL/min (by C-G formula based on SCr of 1.08 mg/dL (H)).  Sepsis Labs: @LABRCNTIP (procalcitonin:4,lacticidven:4) )No results found for this or any previous visit (from the past 240 hour(s)).   Radiological Exams on Admission: Dg Chest 2 View  Result Date: 06/05/2016 CLINICAL DATA:  Dyspnea since Tuesday. History of pulmonary sarcoid. EXAM: CHEST  2 VIEW COMPARISON:  Chest radiograph from 04/04/2015 and chest CT from 03/07/2014 FINDINGS: The heart size and mediastinal contours are within normal limits. Lower lobe predominant interstitial pulmonary fibrosis is without significant change. There are coarse reticular and interstitial markings in the upper lobes with bronchiectasis seen. Some mild pulmonary vascular congestion since prior chest radiograph. Stable right hilar and right paratracheal stripe prominence suggestive of adenopathy. No effusion or pneumothorax. Surgical anchors in the right humeral head. IMPRESSION: Interstitial pulmonary fibrosis with  right hilar and  right peritracheal adenopathy consistent with known pulmonary sarcoid. Slight increase in vascular congestion relative to previous 2016 chest radiograph. Electronically Signed   By: Tollie Eth M.D.   On: 06/05/2016 16:27    EKG: Independently reviewed.  NSR with rate 97; no evidence of acute ischemia  Assessment/Plan Principal Problem:   Acute on chronic respiratory failure with hypoxia (HCC) Active Problems:   PULMONARY SARCOIDOSIS   Mild persistent asthma in adult without complication with component of vcd   Upper airway cough syndrome   Obesity (BMI 30-39.9)   SOB (shortness of breath)   Acute on chronic respiratory failure -Patient with multiple chronic conditions that could currently have exacerbation as cause for symptoms -Primary concern is for PE - patient is fairly immobile and obese and presents with acute worsening in respiratory status as well as with tachycardia and +D-dimer -CTA is pending now; if positive, needs treatment, of course -Has mild chronic asthma but if the exacerbation from asthma were severe enough to cause this level of hypoxia, she would be expected to have marked bronchoconstriction on exam and she does not; will give nebs and continue Symbicort but this is not most likely diagnosis -CXR shows interstitial pulmonary fibrosis and LAD s/w sarcoidosis -Sarcoidosis is the most likely cause if she does not have a PE; will give steroids (Solumedrol 60 mg q12h) and follow -Also obese, may be an OHS component - although this would generally not have such an acute onset -Would suggest pulmonary consultation in AM if no PE is present    DVT prophylaxis:  Lovenox Code Status: Full - confirmed with patient Family Communication: None present Disposition Plan:  Home once clinically improved Consults called: None  Admission status: Observation, telemetry    Jonah Blue MD [facility]  If 7PM-7AM, please contact  night-coverage [email or web address] Password [email or web address]  06/06/2016, 1:24 AM

## 2016-06-05 NOTE — Progress Notes (Signed)
65 yo F sarcoid and asthma on home O2 presents with few days SOB with exertion.  Some of this started with a flu shot the other day.  BP 101/63 (BP Location: Left Arm)   Pulse 106   Temp 98.3 F (36.8 C) (Oral)   Resp 16   SpO2 99%   WBC normal, Cr normal.  CXR clear, BNP and trop negative.  No wheezing or rales, per EDP.  Will obtain D-dimer (EDP wanted to just get CTA but can't because of poor IV access, if she still needed one when she got here because d0dimer was elevated, would need IV team consult).  Will get solumedrol and PRN nebs and transfer to tele OBS for worsening hypoxia.

## 2016-06-05 NOTE — ED Notes (Signed)
Patient up to restroom for UA sample on home O2 concentrator.  POX ready 70% after voiding, increased WOB, pursed lip, tachypnea.  Placed on 3l/m once to room SpO2 slowly increased to >95% on 3 l/m.

## 2016-06-06 DIAGNOSIS — J9621 Acute and chronic respiratory failure with hypoxia: Secondary | ICD-10-CM | POA: Diagnosis present

## 2016-06-06 DIAGNOSIS — R0602 Shortness of breath: Secondary | ICD-10-CM | POA: Diagnosis not present

## 2016-06-06 DIAGNOSIS — D869 Sarcoidosis, unspecified: Secondary | ICD-10-CM | POA: Diagnosis not present

## 2016-06-06 LAB — BASIC METABOLIC PANEL
Anion gap: 11 (ref 5–15)
BUN: 10 mg/dL (ref 6–20)
CALCIUM: 9.5 mg/dL (ref 8.9–10.3)
CHLORIDE: 103 mmol/L (ref 101–111)
CO2: 24 mmol/L (ref 22–32)
CREATININE: 1.14 mg/dL — AB (ref 0.44–1.00)
GFR calc non Af Amer: 49 mL/min — ABNORMAL LOW (ref >60)
GFR, EST AFRICAN AMERICAN: 56 mL/min — AB (ref >60)
Glucose, Bld: 171 mg/dL — ABNORMAL HIGH (ref 65–99)
Potassium: 4 mmol/L (ref 3.5–5.1)
SODIUM: 138 mmol/L (ref 135–145)

## 2016-06-06 LAB — CBC
HCT: 38.5 % (ref 36.0–46.0)
Hemoglobin: 12.1 g/dL (ref 12.0–15.0)
MCH: 29.2 pg (ref 26.0–34.0)
MCHC: 31.4 g/dL (ref 30.0–36.0)
MCV: 92.8 fL (ref 78.0–100.0)
PLATELETS: 261 10*3/uL (ref 150–400)
RBC: 4.15 MIL/uL (ref 3.87–5.11)
RDW: 13.1 % (ref 11.5–15.5)
WBC: 6.3 10*3/uL (ref 4.0–10.5)

## 2016-06-06 MED ORDER — PREDNISONE 10 MG PO TABS: ORAL_TABLET | ORAL | 0 refills | Status: DC

## 2016-06-06 NOTE — Consult Note (Signed)
   [facility] CM Inpatient Consult   06/06/2016  [name] [birth year] [id number]   Patient screened for potential [facility] services. Patient is eligible for [facility] Care Management services under patient's [facility]  Plan. Patient is [name] a 65 y.o.femalewith medical history significant of pulmonary sarcoidosis, asthma presenting with SOB and hypoxia on exertion. Patient thought she was fine until she got her flu shot Tuesday afternoon. She admits to feeling short of breath a/w drop in pulse ox in to the 60-70% at home as well as in her PCP office. She was transferred to [facility] for further evaluation.  Met with the patient to introduce to [facility] program.  Patient is very active in her care.  She sees her primary as needed and endorses Dr. Janett Billow Copland as her primary care.  She denies issues with medications, transportation, or community resource needs.  Gave her a brochure with contact information. No current needs identified for community care management or home visits.For questions contact:   Natividad Brood, RN BSN [facility] Liaison  [phone] business mobile phone Toll free office [phone]

## 2016-06-06 NOTE — Progress Notes (Signed)
Patient's oxygen saturation at rest on 2L of oxygen was 94%. After patient went to the restroom, she was "a little short of breath." Patient placed herself on 5L of oxygen. Maintained at 94%. Patient then ambulated in the hallway. Oxygen saturations maintained between 94-95% on 5L. Patient denies SOB. Dr. Alvino Chapelhoi notified.

## 2016-06-06 NOTE — Discharge Summary (Signed)
Physician Discharge Summary  [name] QMV:[id number] DOB: [birth year] DOA: 06/05/2016  PCP: Abbe AmsterdamOPLAND,JESSICA, MD  Admit date: 06/05/2016 Discharge date: 06/06/2016  Admitted From: Home Disposition:  Home  Recommendations for Outpatient Follow-up:  1. Follow up with PCP in 1 week 2. Follow up with Pulmonology (dr. Sherene SiresWert) in 1 week   Home Health: No  Equipment/Devices: None  Discharge Condition: Stable CODE STATUS: full  Diet recommendation: regular  Brief/Interim Summary: Kiara BuzzardBarbara V Hyltonis a 65 y.o.femalewith medical history significant of pulmonary sarcoidosis, asthma presenting with SOB and hypoxia on exertion. Patient thought she was fine until she got her flu shot Tuesday afternoon. She admits to feeling short of breath a/w drop in pulse ox in to the 60-70% at home as well as in her PCP office. [facility]. CTA chest was ordered to rule out PE which was negative. She was given Solu-Medrol as well as breathing treatments. On Day of discharge, her ambulatory pulse ox ranged between 90% to 94% on 3-5 L of oxygen. Patient was feeling much better and breathing well on steroids. She denied any shortness of breath with ambulation. She was anxious to be discharged home. She is discharged with steroid taper and encouraged follow-up with PCP as well as her pulmonologist.  Discharge Diagnoses:  Principal Problem:   Acute on chronic respiratory failure with hypoxia (HCC) Active Problems:   PULMONARY SARCOIDOSIS   Mild persistent asthma in adult without complication with component of vcd  Acute on chronic respiratory failure -Requires 2L at baseline, up to 3L with exertion at home  -D-dimer positive, CTA chest negative for PE  -Suspect secondary to sarcoid flare -Started on solumedrol 60mg  q12h   -Duonebs q6h, albuterol q2h prn  -Continue dulera    Pulmonary sarcoidosis  -Follows with Dr. Sherene SiresWert for pulm   Discharge  Instructions  Discharge Instructions    Call MD for:    Complete by:  As directed    Shortness of breath   Diet - low sodium heart healthy    Complete by:  As directed    Increase activity slowly    Complete by:  As directed        Medication List    TAKE these medications   amitriptyline 10 MG tablet Commonly known as:  ELAVIL Take 1 tablet (10 mg total) by mouth at bedtime.   ASTRAGALUS PO Take 1 tablet by mouth every morning.   budesonide-formoterol 160-4.5 MCG/ACT inhaler Commonly known as:  SYMBICORT Inhale 2 puffs into the lungs 2 (two) times daily as needed.   Collagen Hydrolysate Powd 1 scoop by Does not apply route daily.   cycloSPORINE 0.05 % ophthalmic emulsion Commonly known as:  RESTASIS Place 1 drop into both eyes 2 (two) times daily.   furosemide 20 MG tablet Commonly known as:  LASIX Take 1 tablet (20 mg total) by mouth daily. Use as needed for swelling   LORazepam 0.5 MG tablet Commonly known as:  ATIVAN Take 1/2- 1 tablet by mouth every 8-12 hours prn only   multivitamin capsule Take 1 capsule by mouth every morning.   pantoprazole 40 MG tablet Commonly known as:  PROTONIX TAKE 1 TABLET (40 MG TOTAL) BY MOUTH DAILY. TAKE 30-60 MIN BEFORE FIRST MEAL OF THE Day   PREBIOTIC PRODUCT PO Take 1 scoop by mouth daily.   predniSONE 10 MG tablet Commonly known as:  DELTASONE Take 4 tabs for 3 days, then 3 tabs for 3 days, then  2 tabs for 3 days, then 1 tab for 3 days, then 1/2 tab for 4 days.   ranitidine 150 MG tablet Commonly known as:  ZANTAC Take 1 tablet (150 mg total) by mouth 2 (two) times daily.   RHINASE Soln Place 2 sprays into the nose 2 (two) times daily.   Vitamin B-12 5000 MCG Tbdp Take 5,000 mcg by mouth daily.   vitamin C 1000 MG tablet Take 1,000 mg by mouth every morning.      Follow-up Information    COPLAND,JESSICA, MD. Go on 06/11/2016.   Specialty:  Family Medicine Why:  @11 :30am Contact information: [address] [place] [address] [phone]        Sandrea Hughs, MD. Schedule an appointment as soon as possible for a visit in 1 week(s).   Specialty:  Pulmonary Disease Contact information: [address] [place] [address] [phone]          Allergies  Allergen Reactions  . Gabapentin     Edema on 2 separate trials  . Penicillins Other (See Comments)    Unknown - childhood allergy  . Aspirin Other (See Comments)    gastritis    Consultations:  None   Procedures/Studies: Dg Chest 2 View  Result Date: 06/05/2016 CLINICAL DATA:  Dyspnea since Tuesday. History of pulmonary sarcoid. EXAM: CHEST  2 VIEW COMPARISON:  Chest radiograph from 04/04/2015 and chest CT from 03/07/2014 FINDINGS: The heart size and mediastinal contours are within normal limits. Lower lobe predominant interstitial pulmonary fibrosis is without significant change. There are coarse reticular and interstitial markings in the upper lobes with bronchiectasis seen. Some mild pulmonary vascular congestion since prior chest radiograph. Stable right hilar and right paratracheal stripe prominence suggestive of adenopathy. No effusion or pneumothorax. Surgical anchors in the right humeral head. IMPRESSION: Interstitial pulmonary fibrosis with right hilar and right peritracheal adenopathy consistent with known pulmonary sarcoid. Slight increase in vascular congestion relative to previous 2016 chest radiograph. Electronically Signed   By: Tollie Eth M.D.   On: 06/05/2016 16:27   Ct Angio Chest Pe W Or Wo Contrast  Result Date: 06/06/2016 CLINICAL DATA:  Onset of severe shortness of breath with exertion, and tachycardia. Decreased O2 saturation. Initial encounter. EXAM: CT ANGIOGRAPHY CHEST WITH CONTRAST TECHNIQUE: Multidetector CT imaging of the chest was performed using the standard protocol during bolus administration of intravenous contrast. Multiplanar CT image reconstructions and MIPs were  obtained to evaluate the vascular anatomy. CONTRAST:  60 mL of Isovue 370 IV contrast COMPARISON:  Chest radiograph performed earlier today at 3:49 p.m., and CTA of the chest performed 03/07/2014 FINDINGS: Cardiovascular: There is no evidence of significant pulmonary embolus. Evaluation for pulmonary embolus is suboptimal due to the extent of parenchymal disease. The heart is grossly unremarkable in appearance. The ascending thoracic aorta is borderline normal in caliber. The great vessels are unremarkable in appearance. Mediastinum/Nodes: Diffuse mediastinal and bilateral hilar lymphadenopathy is seen, with nodes measuring up to 2.9 cm in short axis, compatible with the patient's pulmonary sarcoidosis. No pericardial effusion is seen. The thyroid gland is unremarkable in appearance. No axillary lymphadenopathy is appreciated. Lungs/Pleura: Diffuse honeycombing and scarring are seen involving multiple areas of both lungs. No definite superimposed focal airspace consolidation is seen. No pleural effusion or pneumothorax is identified. No dominant masses are characterized. Upper Abdomen: The visualized portions of the liver and spleen are grossly unremarkable. The patient is status post cholecystectomy, with clips noted at the gallbladder fossa.  The visualized portions of the pancreas, adrenal glands and kidneys are within normal limits. Musculoskeletal: No acute osseous abnormalities are seen. Review of the MIP images confirms the above findings. IMPRESSION: 1. No evidence of significant pulmonary embolus. 2. Diffuse mediastinal and bilateral hilar lymphadenopathy, with nodes measuring up to 2.9 cm in short axis, reflecting the patient's pulmonary sarcoidosis. 3. Diffuse honeycombing and scarring involving multiple areas of both lungs. No definite superimposed focal airspace consolidation seen. Electronically Signed   By: Roanna Raider M.D.   On: 06/06/2016 01:40    Subjective: Patient feeling much better this  morning. She states that her shortness of breath has improved drastically with steroids. She denies any fevers, chills, chest pain or pressure.   Discharge Exam: Vitals:   06/05/16 2221 06/06/16 0443  BP: 108/81 (!) 103/59  Pulse: (!) 113 93  Resp: 18 19  Temp: 98.7 F (37.1 C) 98.1 F (36.7 C)   Vitals:   06/05/16 2221 06/06/16 0144 06/06/16 0443 06/06/16 0909  BP: 108/81  (!) 103/59   Pulse: (!) 113  93   Resp: 18  19   Temp: 98.7 F (37.1 C)  98.1 F (36.7 C)   TempSrc: Oral  Oral   SpO2: 98% 98% 97% 98%  Weight: 97 kg (213 lb 12.8 oz)  96.5 kg (212 lb 11.2 oz)   Height: 5\' 4"  (1.626 m)       General: Pt is alert, awake, not in acute distress Cardiovascular: RRR, S1/S2 +, no rubs, no gallops Respiratory: coarse breath sounds diffusely, no wheeze  Abdominal: Soft, NT, ND, bowel sounds + Extremities: no edema, no cyanosis    The results of significant diagnostics from this hospitalization (including imaging, microbiology, ancillary and laboratory) are listed below for reference.     Microbiology: No results found for this or any previous visit (from the past 240 hour(s)).   Labs: BNP (last 3 results)  Recent Labs  06/05/16 1520  BNP 95.0   Basic Metabolic Panel:  Recent Labs Lab 06/05/16 1520 06/06/16 0443  NA 139 138  K 3.7 4.0  CL 103 103  CO2 27 24  GLUCOSE 83 171*  BUN 14 10  CREATININE 1.08* 1.14*  CALCIUM 9.9 9.5   Liver Function Tests: No results for input(s): AST, ALT, ALKPHOS, BILITOT, PROT, ALBUMIN in the last 168 hours. No results for input(s): LIPASE, AMYLASE in the last 168 hours. No results for input(s): AMMONIA in the last 168 hours. CBC:  Recent Labs Lab 06/05/16 1520 06/06/16 0443  WBC 5.4 6.3  NEUTROABS 2.5  --   HGB 15.3* 12.1  HCT 46.7* 38.5  MCV 91.4 92.8  PLT 194 261   Cardiac Enzymes:  Recent Labs Lab 06/05/16 1520  TROPONINI <0.03   BNP: Invalid input(s): POCBNP CBG: No results for input(s): GLUCAP in  the last 168 hours. D-Dimer  Recent Labs  06/05/16 1521  DDIMER 1.29*   Hgb A1c No results for input(s): HGBA1C in the last 72 hours. Lipid Profile No results for input(s): CHOL, HDL, LDLCALC, TRIG, CHOLHDL, LDLDIRECT in the last 72 hours. Thyroid function studies No results for input(s): TSH, T4TOTAL, T3FREE, THYROIDAB in the last 72 hours.  Invalid input(s): FREET3 Anemia work up No results for input(s): VITAMINB12, FOLATE, FERRITIN, TIBC, IRON, RETICCTPCT in the last 72 hours. Urinalysis    Component Value Date/Time   BILIRUBINUR Negaitve 03/20/2016 1158   PROTEINUR negative 03/20/2016 1158   UROBILINOGEN negative 03/20/2016 1158   NITRITE negative 03/20/2016 1158  LEUKOCYTESUR Negative 03/20/2016 1158   Sepsis Labs Invalid input(s): PROCALCITONIN,  WBC,  LACTICIDVEN Microbiology No results found for this or any previous visit (from the past 240 hour(s)).   Time coordinating discharge: Over 30 minutes  SIGNED:  Noralee Stain, DO [facility] Pager [phone]  If 7PM-7AM, please contact night-coverage [email or web address] Password [email or web address] 06/06/2016, 11:12 AM

## 2016-06-06 NOTE — Progress Notes (Signed)
Pt has orders to be discharged. Discharge instructions given and pt has no additional questions at this time. Medication regimen reviewed and pt educated. Pt verbalized understanding and has no additional questions. Telemetry box removed. IV removed and site in good condition. Pt stable and waiting for transportation. 

## 2016-06-09 ENCOUNTER — Ambulatory Visit: Payer: Medicare Other

## 2016-06-09 ENCOUNTER — Telehealth: Payer: Self-pay | Admitting: *Deleted

## 2016-06-09 NOTE — Telephone Encounter (Signed)
Transition Care Management Follow-up Telephone Call  Per Discharge Summary:  PCP: Abbe AmsterdamOPLAND,JESSICA, MD  Admit date: 06/05/2016 Discharge date: 06/06/2016  Admitted From: Home Disposition:  Home  Recommendations for Outpatient Follow-up:  1. Follow up with PCP in 1 week 2. Follow up with Pulmonology (dr. Sherene SiresWert) in 1 week   Home Health: No  Equipment/Devices: None  Discharge Condition: Stable CODE STATUS: full  Diet recommendation: regular  --   How have you been since you were released from the hospital? "I'm a little bit tired and little bit weak." Pt states she is feeling better and is monitoring her oxygen at home. She is still experiencing drop in sats to 80s w/ exertion and is in 90s at rest. She is able to speak in complete sentences w/o difficulty and is not SOB over the phone.   Do you understand why you were in the hospital? yes   Do you understand the discharge instructions? yes   Where were you discharged to? Home   Items Reviewed:  Medications reviewed: yes  Allergies reviewed: yes  Dietary changes reviewed: yes, regular  Referrals reviewed: yes, pulmonology/Dr. Sherene SiresWert   Functional Questionnaire:   Activities of Daily Living (ADLs):   She states they are independent in the following: ambulation, bathing and hygiene, feeding, continence, grooming, toileting and dressing States they require assistance with the following: none   Any transportation issues/concerns?: no   Any patient concerns? yes, pt has chronic cough (since 2012) that is very annoying to her. The cough is sometimes productive. She would like further workup to determine cause of cough and if there is any infectious etiology. She is interested in sputum culture if appropriate.   Confirmed importance and date/time of follow-up visits scheduled yes  Provider Appointment booked with Dr. Shanda BumpsJessica Copland 06/11/16 @11 :30am  Confirmed with patient if condition begins to worsen call PCP  or go to the ER.  Patient was given the office number and encouraged to call back with question or concerns.  : yes

## 2016-06-11 ENCOUNTER — Ambulatory Visit (INDEPENDENT_AMBULATORY_CARE_PROVIDER_SITE_OTHER): Payer: Medicare Other | Admitting: Family Medicine

## 2016-06-11 ENCOUNTER — Encounter: Payer: Self-pay | Admitting: Family Medicine

## 2016-06-11 VITALS — BP 139/90 | HR 105 | Temp 98.1°F | Ht 65.0 in | Wt 217.6 lb

## 2016-06-11 DIAGNOSIS — Z09 Encounter for follow-up examination after completed treatment for conditions other than malignant neoplasm: Secondary | ICD-10-CM | POA: Diagnosis not present

## 2016-06-11 DIAGNOSIS — D869 Sarcoidosis, unspecified: Secondary | ICD-10-CM | POA: Diagnosis not present

## 2016-06-11 NOTE — Progress Notes (Signed)
Pre visit review using our clinic review tool, if applicable. No additional management support is needed unless otherwise documented below in the visit note. 

## 2016-06-11 NOTE — Progress Notes (Signed)
[facility] [address], [address] [place], [place] [address] [phone]  Date:  06/11/2016   Name:  [name]   DOB:  [birth year]   MRN:  [id number]  PCP:  Abbe Amsterdam, MD    Chief Complaint: Hospitalization Follow-up (Pt was admitted to the ER on 06/05/16. Pt states that she feels better than she did last visit. )   History of Present Illness:  [name] is a 65 y.o. very pleasant female patient who presents with the following:  Seen here on 9/28 with hypoxemia on her baseline O2.  She was admitted overnight, had a negative chest CT and was treated with solumedrol and duonebs/albuterol.  She felt better and was DC home with a steroid taper.   She uses O2 2-3L depending on her activity level; right now she is on 3L since she is moving around Continued Symbicort She is seeing Dr. Sherene Sires tomorrow for follow-up  She is on her steroid taper currently.   Overall she does feel better.  Still notes some mucus and congestion in her chest No fever, energy level is better, no CP  Pulse Readings from Last 3 Encounters:  06/11/16 (!) 105  06/06/16 100  06/05/16 (!) 105     Patient Active Problem List   Diagnosis Date Noted  . Acute on chronic respiratory failure with hypoxia (HCC) 06/06/2016  . SOB (shortness of breath) 06/05/2016  . Osteopenia 02/12/2016  . Lumbar radiculopathy 01/26/2015  . Chronic respiratory failure with hypoxia (HCC) 04/12/2014  . Obesity (BMI 30-39.9) 02/08/2014  . Allergic rhinitis 09/05/2010  . IBS 09/05/2010  . GERD 02/18/2010  . Barrett's esophagus 02/12/2010  . Upper airway cough syndrome 11/02/2009  . Anxiety state 08/29/2008  . History of colonic polyps 08/29/2008  . PULMONARY SARCOIDOSIS 08/12/2007  . Mild persistent asthma in adult without complication with component of vcd 08/12/2007  . Diaphragmatic hernia 08/12/2007  . Migraine 08/12/2007    Past Medical History:   Diagnosis Date  . Anxiety   . Asthma   . Barrett esophagus   . GERD (gastroesophageal reflux disease)   . Hiatal hernia   . IBS (irritable bowel syndrome)   . Migraines   . Pulmonary sarcoidosis [facility])     Past Surgical History:  Procedure Laterality Date  . BRAVO PH STUDY N/A 10/10/2013   Procedure: BRAVO PH STUDY;  Surgeon: Hart Carwin, MD;  Location: [facility] ENDOSCOPY;  Service: Endoscopy;  Laterality: N/A;  placed 27  . BRONCHIAL BIOPSY  1996  . CATARACT EXTRACTION, BILATERAL    . CHOLECYSTECTOMY    . ESOPHAGOGASTRODUODENOSCOPY N/A 10/10/2013   Procedure: ESOPHAGOGASTRODUODENOSCOPY (EGD);  Surgeon: Hart Carwin, MD;  Location: [facility];  Service: Endoscopy;  Laterality: N/A;  pt asthmatic, RA 92% at rest.  Coughing frequently. Wheezes noted in upper lung fields at auscultation. Pt instructed to use home inhaler prior to procedure and placed on supportive O2 @ 2 lpm in pre-procedural. Teaching done ie. use of inhaler. Pt states she ju  . NISSEN FUNDOPLICATION    . ROTATOR CUFF REPAIR     x3(2 on L, 1 on R)    Social History  Substance Use Topics  . Smoking status: Former Smoker    Packs/day: 1.00    Years: 20.00    Types: Cigarettes    Quit date: 09/08/1978  . Smokeless tobacco: Never Used  . Alcohol use No    Family History  Problem Relation Age of Onset  . Adopted: Yes    Allergies  Allergen Reactions  . Gabapentin     Edema on 2 separate trials  . Lactose Intolerance (Gi)   . Penicillins Other (See Comments)    Unknown - childhood allergy  . Pineapple Hives and Itching  . Aspirin Other (See Comments)    gastritis    Medication list has been reviewed and updated.  Current Outpatient Prescriptions on File Prior to Visit  Medication Sig Dispense Refill  . ALOE VERA JUICE PO Take 4 oz by mouth daily.    Marland Kitchen amitriptyline (ELAVIL) 10 MG tablet Take 1 tablet (10 mg total) by mouth at bedtime. (Patient taking differently: Take 10 mg by mouth at bedtime as needed. )  90 tablet 3  . Ascorbic Acid (VITAMIN C) 1000 MG tablet Take 1,000 mg by mouth every morning.     . ASTRAGALUS PO Take 1 tablet by mouth every morning.     . budesonide-formoterol (SYMBICORT) 160-4.5 MCG/ACT inhaler Inhale 2 puffs into the lungs 2 (two) times daily as needed. 1 Inhaler 3  . Collagen Hydrolysate POWD 1 scoop by Does not apply route daily.    . Cyanocobalamin (VITAMIN B-12) 5000 MCG TBDP Take 5,000 mcg by mouth daily.    . cycloSPORINE (RESTASIS) 0.05 % ophthalmic emulsion Place 1 drop into both eyes 2 (two) times daily.     . fluticasone (FLONASE) 50 MCG/ACT nasal spray Place 1 spray into both nostrils 3 (three) times daily.    . furosemide (LASIX) 20 MG tablet Take 1 tablet (20 mg total) by mouth daily. Use as needed for swelling 90 tablet 1  . loratadine (CLARITIN) 10 MG tablet Take 10 mg by mouth daily.    Marland Kitchen LORazepam (ATIVAN) 0.5 MG tablet Take 1/2- 1 tablet by mouth every 8-12 hours prn only 30 tablet 0  . Multiple Vitamin (MULTIVITAMIN) capsule Take 1 capsule by mouth every morning.     . Nasal Moisturizer Combination (RHINASE) SOLN Place 2 sprays into the nose 2 (two) times daily.    . pantoprazole (PROTONIX) 40 MG tablet TAKE 1 TABLET (40 MG TOTAL) BY MOUTH DAILY. TAKE 30-60 MIN BEFORE FIRST MEAL OF THE DAY 30 tablet 2  . PREBIOTIC PRODUCT PO Take 1 scoop by mouth daily.    . predniSONE (DELTASONE) 10 MG tablet Take 4 tabs for 3 days, then 3 tabs for 3 days, then 2 tabs for 3 days, then 1 tab for 3 days, then 1/2 tab for 4 days. 32 tablet 0  . ranitidine (ZANTAC) 150 MG tablet Take 1 tablet (150 mg total) by mouth 2 (two) times daily. 120 tablet 3   No current facility-administered medications on file prior to visit.     Review of Systems:  As per HPI- otherwise negative. No fever, chills, nausea, vomiting  Physical Examination: Vitals:   06/11/16 1141  BP: 139/90  Pulse: (!) 105  Temp: 98.1 F (36.7 C)   Vitals:   06/11/16 1141  Weight: 217 lb 9.6 oz (98.7  kg)  Height: 5\' 5"  (1.651 m)   Body mass index is 36.21 kg/m. Ideal Body Weight: Weight in (lb) to have BMI = 25: 149.9  GEN: WDWN, NAD, Non-toxic, A & O x 3, looks well, wearing her nasal canula, obese HEENT: Atraumatic, Normocephalic. Neck supple. No masses, No LAD. Ears and Nose: No external deformity. CV: RRR, No M/G/R. No JVD. No thrill. No extra heart sounds. PULM: CTA B, no  wheezes, crackles, rhonchi. No retractions. No resp. distress. No accessory muscle use. EXTR: No c/c/e NEURO Normal gait.  PSYCH: Normally interactive. Conversant. Not depressed or anxious appearing.  Calm demeanor.   Assessment and Plan: Hospital discharge follow-up  Sarcoidosis [facility](HCC)  Here today for a hospital follow-up visit.  She is doing better but continues to note chest congestion.  Wondered about inhaled atrovent for her- she will ask Dr. Sherene SiresWert Her creat was minimally elevated in hospital- likely transient.  Will plan to recheck at next visit   Signed Abbe AmsterdamJessica Lilyanna Lunt, MD

## 2016-06-11 NOTE — Patient Instructions (Addendum)
I am glad that you are feeling better!  I wonder if an inhaled anticholinergic agent (like in duoneb) might be helpful for you.  Please ask Dr. Sherene SiresWert about this.   In the meantime mucinex is fine Let me know if there is anything else I can do to help you!

## 2016-06-12 ENCOUNTER — Inpatient Hospital Stay: Payer: Medicare Other | Admitting: Internal Medicine

## 2016-06-12 ENCOUNTER — Ambulatory Visit: Payer: Medicare Other | Admitting: Rehabilitative and Restorative Service Providers"

## 2016-06-19 ENCOUNTER — Inpatient Hospital Stay: Payer: Medicare Other | Admitting: Internal Medicine

## 2016-06-24 DIAGNOSIS — J45909 Unspecified asthma, uncomplicated: Secondary | ICD-10-CM | POA: Diagnosis not present

## 2016-06-24 DIAGNOSIS — D869 Sarcoidosis, unspecified: Secondary | ICD-10-CM | POA: Diagnosis not present

## 2016-06-27 ENCOUNTER — Encounter: Payer: Self-pay | Admitting: Medical

## 2016-06-27 ENCOUNTER — Ambulatory Visit (HOSPITAL_BASED_OUTPATIENT_CLINIC_OR_DEPARTMENT_OTHER)
Admission: RE | Admit: 2016-06-27 | Discharge: 2016-06-27 | Disposition: A | Discharge status: Home / Self Care | Payer: Medicare Other | Source: Ambulatory Visit | Attending: Medical | Admitting: Medical

## 2016-06-27 ENCOUNTER — Ambulatory Visit (INDEPENDENT_AMBULATORY_CARE_PROVIDER_SITE_OTHER): Payer: Medicare Other | Admitting: Medical

## 2016-06-27 VITALS — BP 126/90 | HR 86 | Temp 98.0°F | Ht 65.0 in | Wt 214.8 lb

## 2016-06-27 DIAGNOSIS — R059 Cough, unspecified: Secondary | ICD-10-CM

## 2016-06-27 DIAGNOSIS — D860 Sarcoidosis of lung: Secondary | ICD-10-CM

## 2016-06-27 DIAGNOSIS — R5383 Other fatigue: Secondary | ICD-10-CM

## 2016-06-27 DIAGNOSIS — R062 Wheezing: Secondary | ICD-10-CM

## 2016-06-27 DIAGNOSIS — R05 Cough: Secondary | ICD-10-CM | POA: Insufficient documentation

## 2016-06-27 DIAGNOSIS — J0100 Acute maxillary sinusitis, unspecified: Secondary | ICD-10-CM

## 2016-06-27 DIAGNOSIS — R590 Localized enlarged lymph nodes: Secondary | ICD-10-CM | POA: Insufficient documentation

## 2016-06-27 DIAGNOSIS — Z87891 Personal history of nicotine dependence: Secondary | ICD-10-CM

## 2016-06-27 DIAGNOSIS — R0600 Dyspnea, unspecified: Secondary | ICD-10-CM

## 2016-06-27 LAB — COMPREHENSIVE METABOLIC PANEL
ALBUMIN: 4 g/dL (ref 3.6–5.1)
ALK PHOS: 59 U/L (ref 33–130)
ALT: 14 U/L (ref 6–29)
AST: 21 U/L (ref 10–35)
BILIRUBIN TOTAL: 0.4 mg/dL (ref 0.2–1.2)
BUN: 10 mg/dL (ref 7–25)
CO2: 28 mmol/L (ref 20–31)
CREATININE: 1.11 mg/dL — AB (ref 0.50–0.99)
Calcium: 9.5 mg/dL (ref 8.6–10.4)
Chloride: 103 mmol/L (ref 98–110)
GLUCOSE: 94 mg/dL (ref 65–99)
Potassium: 4 mmol/L (ref 3.5–5.3)
SODIUM: 141 mmol/L (ref 135–146)
Total Protein: 7.2 g/dL (ref 6.1–8.1)

## 2016-06-27 LAB — CBC WITH DIFFERENTIAL/PLATELET
BASOS PCT: 2 %
Basophils Absolute: 108 cells/uL (ref 0–200)
EOS PCT: 7 %
Eosinophils Absolute: 378 cells/uL (ref 15–500)
HEMATOCRIT: 35.6 % (ref 35.0–45.0)
HEMOGLOBIN: 11.7 g/dL (ref 11.7–15.5)
LYMPHS ABS: 2268 {cells}/uL (ref 850–3900)
Lymphocytes Relative: 42 %
MCH: 29.9 pg (ref 27.0–33.0)
MCHC: 32.9 g/dL (ref 32.0–36.0)
MCV: 91 fL (ref 80.0–100.0)
MONO ABS: 216 {cells}/uL (ref 200–950)
MPV: 11.3 fL (ref 7.5–12.5)
Monocytes Relative: 4 %
NEUTROS PCT: 45 %
Neutro Abs: 2430 cells/uL (ref 1500–7800)
Platelets: 239 10*3/uL (ref 140–400)
RBC: 3.91 MIL/uL (ref 3.80–5.10)
RDW: 14 % (ref 11.0–15.0)
WBC: 5.4 10*3/uL (ref 3.8–10.8)

## 2016-06-27 LAB — POC INFLUENZA A&B (BINAX/QUICKVUE)
Influenza A, POC: NEGATIVE
Influenza B, POC: NEGATIVE

## 2016-06-27 MED ORDER — BENZONATATE 100 MG PO CAPS: 100.0000 mg | ORAL_CAPSULE | Freq: Three times a day (TID) | ORAL | 0 refills | Status: DC | PRN

## 2016-06-27 MED ORDER — DOXYCYCLINE HYCLATE 100 MG PO TABS: 100.0000 mg | ORAL_TABLET | Freq: Two times a day (BID) | ORAL | 0 refills | Status: DC

## 2016-06-27 MED ORDER — PREDNISONE 10 MG PO TABS: ORAL_TABLET | ORAL | 0 refills | Status: DC

## 2016-06-27 MED ORDER — IPRATROPIUM BROMIDE HFA 17 MCG/ACT IN AERS: 2.0000 | INHALATION_SPRAY | Freq: Four times a day (QID) | RESPIRATORY_TRACT | 1 refills | Status: AC | PRN

## 2016-06-27 NOTE — Patient Instructions (Addendum)
For your cough  will rx benzonatate.  For wheezing and history of pulmonary sarcoidosis continue your inhalers (add atrovent inhaler). Gave extended course of prednisone as she may need in light of her history. Note also leaving has plan to leave town for a week on Monday.   For fatigue and dyspnea will get cbc, cmp, bnp and cxr.  For sinus infection will rx doxycycline antibiotic.   Follow up on Monday for recheck before heading out of town. Hopefully you will be better.  If you worsen over weekend then ED evaluation.

## 2016-06-27 NOTE — Progress Notes (Signed)
Pre visit review using our clinic review tool, if applicable. No additional management support is needed unless otherwise documented below in the visit note./hsm  

## 2016-06-27 NOTE — Progress Notes (Signed)
Subjective:    Patient ID: [name], female    DOB: Dec 11, 1950, 65 y.o.   MRN: [id number]  HPI  Pt in feeling fatigued today. She feels mild subjective fever yesterday. Pt states she has rt side sinus pressure. No rt ear pain. She always feels nasal congestion.  Some faint feeling of chest congestion. When cough will bring up scant mucous.     Pt is on oxygen continuous. Pt has been on since July 2015. Pt has hx of smoking. She quit in 80's. Pt states she also has history of pulmonary sarcoid. Pt after most recent hospitalization did not follow up with pulmonologist as was recommended on October 5th. Dr. Patsy Lager was wondering if maybe she would benefit from atrovent.. But pt never had chance to ask him since she did not follow up.  Pt has seen Dr. Sherene Sires in the pat. She is on symbicort and albuterol. Pt uses symbicort as needed.  Pt after most recent hospitalization was discharge on 14 days of tapered prednisone. Pt finished tapered prednisone 5 days ago.   Pt states she is heading out of town on Monday. Daughter is getting married. She will be seen on Friday 28 th.She expresses desire not to be sick while out of town.  Review of Systems  Constitutional: Positive for fatigue and fever. Negative for chills.  HENT: Positive for congestion and sinus pressure. Negative for ear pain, postnasal drip, sneezing and sore throat.   Respiratory: Positive for cough and wheezing. Negative for chest tightness and shortness of breath.   Cardiovascular: Negative for chest pain and palpitations.  Gastrointestinal: Negative for abdominal pain.  Genitourinary: Negative for dysuria.  Musculoskeletal: Negative for back pain.       No leg pain.  Skin: Negative for rash.  Neurological: Negative for dizziness, speech difficulty, weakness, numbness and headaches.  Hematological: Negative for adenopathy. Does not bruise/bleed easily.    Past Medical History:  Diagnosis Date  . Anxiety   . Asthma     . Barrett esophagus   . GERD (gastroesophageal reflux disease)   . Hiatal hernia   . IBS (irritable bowel syndrome)   . Migraines   . Pulmonary sarcoidosis [facility])      Social History   Social History  . Marital status: Married    Spouse name: N/A  . Number of children: N/A  . Years of education: N/A   Occupational History  . Semi RetiredWater engineer at [employer] History Main Topics  . Smoking status: Former Smoker    Packs/day: 1.00    Years: 20.00    Types: Cigarettes    Quit date: 09/08/1978  . Smokeless tobacco: Never Used  . Alcohol use No  . Drug use: No  . Sexual activity: Not on file   Other Topics Concern  . Not on file   Social History Narrative  . No narrative on file    Past Surgical History:  Procedure Laterality Date  . BRAVO PH STUDY N/A 10/10/2013   Procedure: BRAVO PH STUDY;  Surgeon: Hart Carwin, MD;  Location: [facility];  Service: Endoscopy;  Laterality: N/A;  placed 29  . BRONCHIAL BIOPSY  1996  . CATARACT EXTRACTION, BILATERAL    . CHOLECYSTECTOMY    . ESOPHAGOGASTRODUODENOSCOPY N/A 10/10/2013   Procedure: ESOPHAGOGASTRODUODENOSCOPY (EGD);  Surgeon: Hart Carwin, MD;  Location: [facility];  Service: Endoscopy;  Laterality: N/A;  pt asthmatic, RA 92% at rest.  Coughing frequently. Wheezes noted  in upper lung fields at auscultation. Pt instructed to use home inhaler prior to procedure and placed on supportive O2 @ 2 lpm in pre-procedural. Teaching done ie. use of inhaler. Pt states she ju  . NISSEN FUNDOPLICATION    . ROTATOR CUFF REPAIR     x3(2 on L, 1 on R)    Family History  Problem Relation Age of Onset  . Adopted: Yes    Allergies  Allergen Reactions  . Gabapentin     Edema on 2 separate trials  . Lactose Intolerance (Gi)   . Penicillins Other (See Comments)    Unknown - childhood allergy  . Pineapple Hives and Itching  . Aspirin Other (See Comments)    gastritis    Current Outpatient Prescriptions on File  Prior to Visit  Medication Sig Dispense Refill  . ALOE VERA JUICE PO Take 4 oz by mouth daily.    Marland Kitchen. amitriptyline (ELAVIL) 10 MG tablet Take 1 tablet (10 mg total) by mouth at bedtime. (Patient taking differently: Take 10 mg by mouth at bedtime as needed. ) 90 tablet 3  . Ascorbic Acid (VITAMIN C) 1000 MG tablet Take 1,000 mg by mouth every morning.     . ASTRAGALUS PO Take 1 tablet by mouth every morning.     . budesonide-formoterol (SYMBICORT) 160-4.5 MCG/ACT inhaler Inhale 2 puffs into the lungs 2 (two) times daily as needed. 1 Inhaler 3  . Collagen Hydrolysate POWD 1 scoop by Does not apply route daily.    . Cyanocobalamin (VITAMIN B-12) 5000 MCG TBDP Take 5,000 mcg by mouth daily.    . cycloSPORINE (RESTASIS) 0.05 % ophthalmic emulsion Place 1 drop into both eyes 2 (two) times daily.     . fluticasone (FLONASE) 50 MCG/ACT nasal spray Place 1 spray into both nostrils 3 (three) times daily.    . furosemide (LASIX) 20 MG tablet Take 1 tablet (20 mg total) by mouth daily. Use as needed for swelling 90 tablet 1  . loratadine (CLARITIN) 10 MG tablet Take 10 mg by mouth daily.    Marland Kitchen. LORazepam (ATIVAN) 0.5 MG tablet Take 1/2- 1 tablet by mouth every 8-12 hours prn only 30 tablet 0  . Multiple Vitamin (MULTIVITAMIN) capsule Take 1 capsule by mouth every morning.     . Nasal Moisturizer Combination (RHINASE) SOLN Place 2 sprays into the nose 2 (two) times daily.    . pantoprazole (PROTONIX) 40 MG tablet TAKE 1 TABLET (40 MG TOTAL) BY MOUTH DAILY. TAKE 30-60 MIN BEFORE FIRST MEAL OF THE DAY 30 tablet 2  . PREBIOTIC PRODUCT PO Take 1 scoop by mouth daily.    . ranitidine (ZANTAC) 150 MG tablet Take 1 tablet (150 mg total) by mouth 2 (two) times daily. 120 tablet 3   No current facility-administered medications on file prior to visit.     BP 126/90   Pulse 86   Temp 98 F (36.7 C) (Oral)   Ht 5\' 5"  (1.651 m)   Wt 214 lb 12.8 oz (97.4 kg)   BMI 35.74 kg/m       Objective:   Physical  Exam  General  Mental Status - Alert. General Appearance - Well groomed. Not in acute distress. No labored breathing.  Skin Rashes- No Rashes.  HEENT Head- Normal. Ear Auditory Canal - Left- Normal. Right - Normal.Tympanic Membrane- Left- Normal. Right- Normal. Eye Sclera/Conjunctiva- Left- Normal. Right- Normal. Nose & Sinuses Nasal Mucosa- Left-  Boggy and Congested. Right-  Boggy and  Congested.Rt  sided  Maxillary pain on palpation but no frontal sinus pressure.(no left side tenderness0 Mouth & Throat Lips: Upper Lip- Normal: no dryness, cracking, pallor, cyanosis, or vesicular eruption. Lower Lip-Normal: no dryness, cracking, pallor, cyanosis or vesicular eruption. Buccal Mucosa- Bilateral- No Aphthous ulcers. Oropharynx- No Discharge or Erythema. Tonsils: Characteristics- Bilateral- No Erythema or Congestion. Size/Enlargement- Bilateral- No enlargement. Discharge- bilateral-None.  Neck Neck- Supple. No Masses.   Chest and Lung Exam Auscultation: Breath Sounds:-even and unlabored but shallow respirations.  Cardiovascular Auscultation:Rythm- Regular, rate and rhythm. Murmurs & Other Heart Sounds:Ausculatation of the heart reveal- No Murmurs.  Lymphatic Head & Neck General Head & Neck Lymphatics: Bilateral: Description- No Localized lymphadenopathy.  Lower ext- no pedal edema bilateral. Neg homans sign bilaterally.       Assessment & Plan:  For your cough  will rx benzonatate.  For wheezing and history of pulmonary sarcoidosis continue your inhalers (add atrovent inhaler). Gave extended course of prednisone as she may need in light of her history. Note also leaving has plan to leave town for a week on Monday.  For fatigue and dyspnea will get cbc, cmp, bnp and cxr.  For sinus infection will rx doxycycline antibiotic.   Follow up on Monday for recheck before heading out of town. Hopefully you will be better.  If you worsen over weekend then ED  evaluation.  Alivea Gladson, Ramon Dredge, PA-C

## 2016-06-30 ENCOUNTER — Encounter: Payer: Self-pay | Admitting: Medical

## 2016-06-30 ENCOUNTER — Ambulatory Visit (INDEPENDENT_AMBULATORY_CARE_PROVIDER_SITE_OTHER): Payer: Medicare Other | Admitting: Medical

## 2016-06-30 VITALS — BP 120/78 | HR 68 | Temp 97.8°F | Ht 65.0 in | Wt 214.0 lb

## 2016-06-30 DIAGNOSIS — R062 Wheezing: Secondary | ICD-10-CM | POA: Diagnosis not present

## 2016-06-30 DIAGNOSIS — J0100 Acute maxillary sinusitis, unspecified: Secondary | ICD-10-CM

## 2016-06-30 DIAGNOSIS — D860 Sarcoidosis of lung: Secondary | ICD-10-CM

## 2016-06-30 NOTE — Progress Notes (Signed)
Pre visit review using our clinic review tool, if applicable. No additional management support is needed unless otherwise documented below in the visit note./hsm  

## 2016-06-30 NOTE — Progress Notes (Signed)
Subjective:    Patient ID: [name], female    DOB: Jul 07, 1951, 65 y.o.   MRN: [id number]  HPI  Pt states feels good today. She states she had very good day on Saturday. Sunday was ok day. She states she feels good again today.   Pt no fever, no chills or sweats. Pt cxr showed no pneumonia on chest xray on Friday. She is on oxygen continuously and planning to leave tomorrow for [facility]. Daughter wedding.  Pt has hx of sarcoid and history of asthma.  She is on symbicort and albuterol. Added atrovent on Friday and prednisone. Breathing better since Friday. Also reporting less sinus pressure since starting the doxycycline.    Review of Systems  Constitutional: Negative for chills, fatigue and fever.  HENT: Negative for congestion, ear pain, facial swelling, postnasal drip, rhinorrhea, sinus pressure and sneezing.   Respiratory: Positive for cough, shortness of breath and wheezing.        Less shortness of breath and less wheezing.  Cardiovascular: Negative for chest pain and palpitations.  Gastrointestinal: Negative for abdominal pain and blood in stool.  Genitourinary: Negative for difficulty urinating, flank pain, frequency, pelvic pain and urgency.  Musculoskeletal: Negative for back pain.  Skin: Negative for rash.  Hematological: Negative for adenopathy. Does not bruise/bleed easily.  Psychiatric/Behavioral: Negative for behavioral problems and confusion.    Past Medical History:  Diagnosis Date  . Anxiety   . Asthma   . Barrett esophagus   . GERD (gastroesophageal reflux disease)   . Hiatal hernia   . IBS (irritable bowel syndrome)   . Migraines   . Pulmonary sarcoidosis [facility])      Social History   Social History  . Marital status: Married    Spouse name: N/A  . Number of children: N/A  . Years of education: N/A   Occupational History  . Semi RetiredWater engineer at [employer] History Main Topics  . Smoking status: Former Smoker   Packs/day: 1.00    Years: 20.00    Types: Cigarettes    Quit date: 09/08/1978  . Smokeless tobacco: Never Used  . Alcohol use No  . Drug use: No  . Sexual activity: Not on file   Other Topics Concern  . Not on file   Social History Narrative  . No narrative on file    Past Surgical History:  Procedure Laterality Date  . BRAVO PH STUDY N/A 10/10/2013   Procedure: BRAVO PH STUDY;  Surgeon: Hart Carwin, MD;  Location: [facility];  Service: Endoscopy;  Laterality: N/A;  placed 29  . BRONCHIAL BIOPSY  1996  . CATARACT EXTRACTION, BILATERAL    . CHOLECYSTECTOMY    . ESOPHAGOGASTRODUODENOSCOPY N/A 10/10/2013   Procedure: ESOPHAGOGASTRODUODENOSCOPY (EGD);  Surgeon: Hart Carwin, MD;  Location: [facility];  Service: Endoscopy;  Laterality: N/A;  pt asthmatic, RA 92% at rest.  Coughing frequently. Wheezes noted in upper lung fields at auscultation. Pt instructed to use home inhaler prior to procedure and placed on supportive O2 @ 2 lpm in pre-procedural. Teaching done ie. use of inhaler. Pt states she ju  . NISSEN FUNDOPLICATION    . ROTATOR CUFF REPAIR     x3(2 on L, 1 on R)    Family History  Problem Relation Age of Onset  . Adopted: Yes    Allergies  Allergen Reactions  . Gabapentin     Edema on 2 separate trials  . Lactose Intolerance (Gi)   .  Penicillins Other (See Comments)    Unknown - childhood allergy  . Pineapple Hives and Itching  . Aspirin Other (See Comments)    gastritis    Current Outpatient Prescriptions on File Prior to Visit  Medication Sig Dispense Refill  . ALOE VERA JUICE PO Take 4 oz by mouth daily.    Marland Kitchen. amitriptyline (ELAVIL) 10 MG tablet Take 1 tablet (10 mg total) by mouth at bedtime. (Patient taking differently: Take 10 mg by mouth at bedtime as needed. ) 90 tablet 3  . Ascorbic Acid (VITAMIN C) 1000 MG tablet Take 1,000 mg by mouth every morning.     . ASTRAGALUS PO Take 1 tablet by mouth every morning.     . benzonatate (TESSALON) 100 MG capsule  Take 1 capsule (100 mg total) by mouth 3 (three) times daily as needed for cough. 21 capsule 0  . budesonide-formoterol (SYMBICORT) 160-4.5 MCG/ACT inhaler Inhale 2 puffs into the lungs 2 (two) times daily as needed. 1 Inhaler 3  . Calcium-Magnesium-Vitamin D (CALCIUM 1200+D3 PO) Take by mouth.    . Collagen Hydrolysate POWD 1 scoop by Does not apply route daily.    . Cyanocobalamin (VITAMIN B-12) 5000 MCG TBDP Take 5,000 mcg by mouth daily.    . cycloSPORINE (RESTASIS) 0.05 % ophthalmic emulsion Place 1 drop into both eyes 2 (two) times daily.     Marland Kitchen. doxycycline (VIBRA-TABS) 100 MG tablet Take 1 tablet (100 mg total) by mouth 2 (two) times daily. Can give caps or generic. 20 tablet 0  . fluticasone (FLONASE) 50 MCG/ACT nasal spray Place 1 spray into both nostrils 3 (three) times daily.    . furosemide (LASIX) 20 MG tablet Take 1 tablet (20 mg total) by mouth daily. Use as needed for swelling 90 tablet 1  . ipratropium (ATROVENT HFA) 17 MCG/ACT inhaler Inhale 2 puffs into the lungs every 6 (six) hours as needed for wheezing. 1 Inhaler 1  . loratadine (CLARITIN) 10 MG tablet Take 10 mg by mouth daily.    Marland Kitchen. LORazepam (ATIVAN) 0.5 MG tablet Take 1/2- 1 tablet by mouth every 8-12 hours prn only 30 tablet 0  . Multiple Vitamin (MULTIVITAMIN) capsule Take 1 capsule by mouth every morning.     . Nasal Moisturizer Combination (RHINASE) SOLN Place 2 sprays into the nose 2 (two) times daily.    . pantoprazole (PROTONIX) 40 MG tablet TAKE 1 TABLET (40 MG TOTAL) BY MOUTH DAILY. TAKE 30-60 MIN BEFORE FIRST MEAL OF THE DAY 30 tablet 2  . PREBIOTIC PRODUCT PO Take 1 scoop by mouth daily.    . predniSONE (DELTASONE) 10 MG tablet Take 4 tabs for 3 days, then 3 tabs for 3 days, then 2 tabs for 3 days, then 1 tab for 3 days, then 1/2 tab for 4 days. 32 tablet 0  . ranitidine (ZANTAC) 150 MG tablet Take 1 tablet (150 mg total) by mouth 2 (two) times daily. 120 tablet 3   No current facility-administered medications on  file prior to visit.     BP 120/78 (BP Location: Left Arm)   Pulse 68   Temp 97.8 F (36.6 C) (Oral)   Ht 5\' 5"  (1.651 m)   Wt 214 lb (97.1 kg)   SpO2 93% Comment: When she was at home ealier today was 99%.  BMI 35.61 kg/m       Objective:   Physical Exam  General  Mental Status - Alert. General Appearance - Well groomed. Not in acute distress.  Skin Rashes- No Rashes.  HEENT Head- Normal. Ear Auditory Canal - Left- Normal. Right - Normal.Tympanic Membrane- Left- Normal. Right- Normal. Eye Sclera/Conjunctiva- Left- Normal. Right- Normal. Nose & Sinuses Nasal Mucosa- Left-  Boggy and Congested. Right-  Boggy and  Congested.Faint rt side maxillary tender but less than on friday but no  frontal sinus pressure. Mouth & Throat Lips: Upper Lip- Normal: no dryness, cracking, pallor, cyanosis, or vesicular eruption. Lower Lip-Normal: no dryness, cracking, pallor, cyanosis or vesicular eruption. Buccal Mucosa- Bilateral- No Aphthous ulcers. Oropharynx- No Discharge or Erythema. Tonsils: Characteristics- Bilateral- No Erythema or Congestion. Size/Enlargement- Bilateral- No enlargement. Discharge- bilateral-None.  Neck Neck- Supple. No Masses.   Chest and Lung Exam Auscultation: Breath Sounds:-Clear even and unlabored.  Cardiovascular Auscultation:Rythm- Regular, rate and rhythm. Murmurs & Other Heart Sounds:Ausculatation of the heart reveal- No Murmurs.  Lymphatic Head & Neck General Head & Neck Lymphatics: Bilateral: Description- No Localized lymphadenopathy.   Lower ext- no pedal edema. Negative homans signs.      Assessment & Plan:  For your wheezing, asthma flare and hx of sarcoidosis continue you symbicort, albuterol, atrovent and tapered prednisone(which just restarted on Friday.) Continue oxygen continuously.  Your sinus infection infection continue doxycycline antibiotic.  Both sinus infection and asthma improved. While on your trip if your symptoms  worsen or change be seen by provider in [place].  Follow up 10-14 days or as needed  Maylee Bare, Ramon Dredge, [employer]

## 2016-06-30 NOTE — Patient Instructions (Addendum)
For your wheezing, asthma flare and hx of sarcoidosis continue you symbicort, albuterol, atrovent and tapered prednisone(which just restarted on Friday.) Continue oxygen.  Your sinus infection infection continue doxycycline antibiotic.  Both sinus infection and asthma  Symptoms improved. While on your trip if your symptoms worsen or change [place] seen by [place].  Follow up 10-14 days or as needed

## 2016-07-11 ENCOUNTER — Inpatient Hospital Stay: Payer: Medicare Other | Admitting: Internal Medicine

## 2016-07-24 ENCOUNTER — Encounter: Payer: Self-pay | Admitting: Pulmonary Disease

## 2016-07-24 ENCOUNTER — Ambulatory Visit (INDEPENDENT_AMBULATORY_CARE_PROVIDER_SITE_OTHER): Payer: Medicare Other | Admitting: Pulmonary Disease

## 2016-07-24 VITALS — BP 118/66 | HR 125 | Ht 65.0 in | Wt 218.0 lb

## 2016-07-24 DIAGNOSIS — D869 Sarcoidosis, unspecified: Secondary | ICD-10-CM | POA: Diagnosis not present

## 2016-07-24 MED ORDER — CEFUROXIME AXETIL 500 MG PO TABS: 500.0000 mg | ORAL_TABLET | Freq: Two times a day (BID) | ORAL | 0 refills | Status: AC

## 2016-07-24 MED ORDER — PREDNISONE 10 MG PO TABS: ORAL_TABLET | ORAL | 1 refills | Status: DC

## 2016-07-24 NOTE — Assessment & Plan Note (Signed)
It seems that pulse O2 is not enough I doubt that 4 L pulse would be enough for her, she seems to require 2 L continuous oxygen at the very least We will try to obtain a portable concentrator with that setting

## 2016-07-24 NOTE — Assessment & Plan Note (Signed)
Unclear to me if her sarcoidosis is still active or is burned out sarcoidosis with diffuse lung disease and what we are seeing is development of new pulmonary hypertension or other pathology superimposed on sarcoidosis such as acute infection  We'll treat her with low-dose prednisone for about 4 weeks and then based on symptoms gradually tapered to off.   Prednisone 5 mg tabs  Take 2 tabs daily with food x 2 weeks , then 1 tab daily with food  until next visit  Ceftin 500 mg twice daily for 10 days  Check echo for pulmonary hypertension Schedule PFTs

## 2016-07-24 NOTE — Patient Instructions (Signed)
Prednisone 5 mg tabs  Take 2 tabs daily with food x 2 weeks , then 1 tab daily with food  until next visit  Ceftin 500 mg twice daily for 10 days  Check echo Schedule PFTs  Based on walk test, we'll decide the right kind of oxygen for you

## 2016-07-24 NOTE — Progress Notes (Signed)
Subjective:    Patient ID: [name], female    DOB: 01-27-1951, 65 y.o.   MRN: [id number]  HPI   [id number] yobf quit smoking in 1980 and dx with sarcoidosis in 1996 She has been maintained on oxygen since 2015  Chief Complaint  Patient presents with  . Advice Only    [facility] pt transfering care to [facility] office- treated for asthma, sarcoidosis.     Last seen 03/2015 Felt to have mild persistent asthma and sarcoidosis Now [facility] [place] [facility] [place] She is an maintained on a portable concentrator since 2015 on 3 L pulse-not sure if this was adequate but lately she has noted that her oxygen saturation drops very low on exertion. Today for instance it dropped down to the 60s, she checks with a pulse oximeter and it took her several minutes to recover she also notes that her heart rate gets very high during these episodes  She had an ER visit and obtain CT angiogram 05/2016-which shows diffuse honeycombing and fibrosis involving multiple areas of both lungs and diffuse mediastinal and bilateral hilar lymphadenopathy  She then saw her PCP in 06/2016 and was given prednisone and doxycycline, Atrovent MDI was added to albuterol-she felt slightly better and [place]   On walking she seems to desaturate on pulse O2, but was able to maintain her saturations on 3 L continuous oxygen and then also on 2 L continuous oxygen   Significant tests/ events  Pfts 08/22/2013 nl lung vols,  dlco 53 corrects to 73% - PFTs 05/16/2014  Nl lung vols,  DLCO 42% corrects to 67%  CT angio 05/2016 >> Diffuse mediastinal and bilateral hilar lymphadenopathy, Diffuse honeycombing and scarring    Past Medical History:  Diagnosis Date  . Anxiety   . Asthma   . Barrett esophagus   . GERD (gastroesophageal reflux disease)   . Hiatal hernia   . IBS (irritable bowel syndrome)   . Migraines   . Pulmonary sarcoidosis [facility])       Past Surgical History:  Procedure Laterality Date  . BRAVO PH STUDY N/A 10/10/2013   Procedure: BRAVO PH STUDY;  Surgeon: Hart Carwinora M Brodie, MD;  Location: [facility];  Service: Endoscopy;  Laterality: N/A;  placed 29  . BRONCHIAL BIOPSY  1996  . CATARACT EXTRACTION, BILATERAL    . CHOLECYSTECTOMY    . ESOPHAGOGASTRODUODENOSCOPY N/A 10/10/2013   Procedure: ESOPHAGOGASTRODUODENOSCOPY (EGD);  Surgeon: Hart Carwinora M Brodie, MD;  [facility];  Service: Endoscopy;  Laterality: N/A;  pt asthmatic, RA 92% at rest.  Coughing frequently. Wheezes noted in upper lung fields at auscultation. Pt instructed to use home inhaler prior to procedure and placed on supportive O2 @ 2 lpm in pre-procedural. Teaching done ie. use of inhaler. Pt states she ju  . NISSEN FUNDOPLICATION    . ROTATOR CUFF REPAIR     x3(2 on L, 1 on R)     Review of Systems  Constitutional: Negative for fever and unexpected weight change.  HENT: Negative for congestion, dental problem, ear pain, nosebleeds, postnasal drip, rhinorrhea, sinus pressure, sneezing, sore throat and trouble swallowing.   Eyes: Negative for redness and itching.  Respiratory: Positive for chest tightness, shortness of breath and wheezing. Negative for cough.   Cardiovascular: Negative for palpitations and leg swelling.  Gastrointestinal: Negative for nausea and vomiting.  Genitourinary: Negative for dysuria.  Musculoskeletal:  Negative for joint swelling.  Skin: Negative for rash.  Neurological: Negative for headaches.  Hematological: Does not bruise/bleed easily.  Psychiatric/Behavioral: Negative for dysphoric mood. The patient is not nervous/anxious.        Objective:   Physical Exam        Assessment & Plan:

## 2016-07-25 DIAGNOSIS — J45909 Unspecified asthma, uncomplicated: Secondary | ICD-10-CM | POA: Diagnosis not present

## 2016-07-25 DIAGNOSIS — D869 Sarcoidosis, unspecified: Secondary | ICD-10-CM | POA: Diagnosis not present

## 2016-08-14 ENCOUNTER — Ambulatory Visit (HOSPITAL_COMMUNITY)
Admission: RE | Admit: 2016-08-14 | Discharge: 2016-08-14 | Disposition: A | Discharge status: Home / Self Care | Payer: Medicare Other | Source: Ambulatory Visit | Attending: Pulmonary Disease | Admitting: Pulmonary Disease

## 2016-08-14 ENCOUNTER — Ambulatory Visit (HOSPITAL_BASED_OUTPATIENT_CLINIC_OR_DEPARTMENT_OTHER)
Admission: RE | Admit: 2016-08-14 | Discharge: 2016-08-14 | Disposition: A | Discharge status: Home / Self Care | Payer: Medicare Other | Source: Ambulatory Visit | Attending: Pulmonary Disease | Admitting: Pulmonary Disease

## 2016-08-14 DIAGNOSIS — D869 Sarcoidosis, unspecified: Secondary | ICD-10-CM

## 2016-08-14 DIAGNOSIS — I517 Cardiomegaly: Secondary | ICD-10-CM | POA: Diagnosis not present

## 2016-08-14 DIAGNOSIS — J988 Other specified respiratory disorders: Secondary | ICD-10-CM | POA: Insufficient documentation

## 2016-08-14 LAB — PULMONARY FUNCTION TEST
DL/VA % PRED: 57 %
DL/VA: 2.71 ml/min/mmHg/L
DLCO unc % pred: 35 %
DLCO unc: 8.09 ml/min/mmHg
FEF 25-75 POST: 2.95 L/s
FEF 25-75 Pre: 1.62 L/sec
FEF2575-%CHANGE-POST: 81 %
FEF2575-%PRED-POST: 173 %
FEF2575-%PRED-PRE: 95 %
FEV1-%Change-Post: 15 %
FEV1-%Pred-Post: 88 %
FEV1-%Pred-Pre: 76 %
FEV1-PRE: 1.39 L
FEV1-Post: 1.6 L
FEV1FVC-%CHANGE-POST: 4 %
FEV1FVC-%PRED-PRE: 107 %
FEV6-%CHANGE-POST: 10 %
FEV6-%Pred-Post: 81 %
FEV6-%Pred-Pre: 73 %
FEV6-Post: 1.83 L
FEV6-Pre: 1.65 L
FEV6FVC-%Pred-Post: 104 %
FEV6FVC-%Pred-Pre: 104 %
FVC-%Change-Post: 10 %
FVC-%PRED-POST: 78 %
FVC-%PRED-PRE: 71 %
FVC-POST: 1.83 L
FVC-PRE: 1.65 L
POST FEV1/FVC RATIO: 87 %
POST FEV6/FVC RATIO: 100 %
PRE FEV1/FVC RATIO: 84 %
Pre FEV6/FVC Ratio: 100 %
RV % pred: 35 %
RV: 0.74 L
TLC % PRED: 68 %
TLC: 3.34 L

## 2016-08-14 MED ORDER — ALBUTEROL SULFATE (2.5 MG/3ML) 0.083% IN NEBU
2.5000 mg | INHALATION_SOLUTION | Freq: Once | RESPIRATORY_TRACT | Status: AC
  Administered 2016-08-14: 2.5 mg via RESPIRATORY_TRACT

## 2016-08-14 NOTE — Progress Notes (Signed)
Echocardiogram 2D Echocardiogram has been performed.  [name] 08/14/2016, 2:46 PM

## 2016-08-24 DIAGNOSIS — D869 Sarcoidosis, unspecified: Secondary | ICD-10-CM | POA: Diagnosis not present

## 2016-08-24 DIAGNOSIS — J45909 Unspecified asthma, uncomplicated: Secondary | ICD-10-CM | POA: Diagnosis not present

## 2016-08-28 ENCOUNTER — Encounter: Payer: Self-pay | Admitting: Adult Health

## 2016-08-28 ENCOUNTER — Ambulatory Visit (INDEPENDENT_AMBULATORY_CARE_PROVIDER_SITE_OTHER): Payer: Medicare Other | Admitting: Adult Health

## 2016-08-28 DIAGNOSIS — D869 Sarcoidosis, unspecified: Secondary | ICD-10-CM | POA: Diagnosis not present

## 2016-08-28 DIAGNOSIS — J9611 Chronic respiratory failure with hypoxia: Secondary | ICD-10-CM

## 2016-08-28 DIAGNOSIS — J4530 Mild persistent asthma, uncomplicated: Secondary | ICD-10-CM | POA: Diagnosis not present

## 2016-08-28 MED ORDER — PREDNISONE 5 MG PO TABS: 5.0000 mg | ORAL_TABLET | Freq: Every day | ORAL | 3 refills | Status: DC

## 2016-08-28 NOTE — Progress Notes (Signed)
@Patient  ID: [name], female    DOB: 1951/01/07, 65 y.o.   MRN: [id number]  Chief Complaint  Patient presents with  . Follow-up    Asthma     Referring provider: Copland, Gwenlyn Found, MD  HPI: 65 year old female former smoker followed for asthma and sarcoid, O2 dependent RF   TEST   PFT's 08/22/2013  FEV1  1.96 (103%); ratio 83 and no change p B2 and nl FEF 25/75 p am symbicort, dLco 53 corrects to 72%> rec trial off symbicort > no change symptoms x months then flared 02/2014 and started back on pred daily    - 05/16/14 pFT's s airflow obst  CT angiogram 05/2016-which shows diffuse honeycombing and fibrosis involving multiple areas of both lungs and diffuse mediastinal and bilateral hilar lymphadenopathy   08/28/2016 Follow up : Sarcoid and Asthma  Patient returns for a one-month follow-up. Patient was treated for possible sarcoid flare with progressive sarcoid changes on CT chest . last visit. She was started on a prednisone taper., currently on 5mg  daily . She was treated with 10 days of Ceftin for acute bronchitis.Marland Kitchen She was set up for an echo to rule out pulmonary hypertension and pulmonary function test. 2-D echo on December 7 showed an EF of 60-65%, grade 1 diastolic dysfunction, pulmonary artery pressure 30 [email or web address] dilated aortic root PFT 12/7 shows FEV1 88%, ratio 87, FVC 78%, ++BD repsonse , DLCO 35% (down from 42%) On symbicort Twice daily , helps with wheezing . On O2 2l/m helps with dyspnea.  Gets winded with prolonged walking . Feels about the same without much change in breathing    Allergies  Allergen Reactions  . Gabapentin     Edema on 2 separate trials  . Lactose Intolerance (Gi)   . Penicillins Other (See Comments)    Unknown - childhood allergy  . Pineapple Hives and Itching  . Aspirin Other (See Comments)    gastritis    Immunization History  Administered Date(s) Administered  . Influenza Split 07/29/2012  . Influenza Whole 07/09/2002,  07/09/2009, 06/20/2010  . Influenza, High Dose Seasonal PF 06/03/2016  . Influenza,inj,Quad PF,36+ Mos 07/11/2013, 06/29/2014  . Pneumococcal Conjugate-13 06/29/2014  . Pneumococcal Polysaccharide-23 07/09/2002, 12/14/2009, 03/04/2016  . Td 09/05/2010    Past Medical History:  Diagnosis Date  . Anxiety   . Asthma   . Barrett esophagus   . GERD (gastroesophageal reflux disease)   . Hiatal hernia   . IBS (irritable bowel syndrome)   . Migraines   . Pulmonary sarcoidosis (HCC)     Tobacco History: History  Smoking Status  . Former Smoker  . Packs/day: 1.00  . Years: 20.00  . Types: Cigarettes  . Quit date: 09/08/1978  Smokeless Tobacco  . Never Used   Counseling given: Not Answered   Outpatient Encounter Prescriptions as of 08/28/2016  Medication Sig  . ALOE VERA JUICE PO Take 4 oz by mouth daily.  Marland Kitchen amitriptyline (ELAVIL) 10 MG tablet Take 1 tablet (10 mg total) by mouth at bedtime. (Patient taking differently: Take 10 mg by mouth at bedtime as needed. )  . Ascorbic Acid (VITAMIN C) 1000 MG tablet Take 1,000 mg by mouth every morning.   . ASTRAGALUS PO Take 1 tablet by mouth every morning.   . budesonide-formoterol (SYMBICORT) 160-4.5 MCG/ACT inhaler Inhale 2 puffs into the lungs 2 (two) times daily as needed.  . Calcium-Magnesium-Vitamin D (CALCIUM 1200+D3 PO) Take by mouth.  . Collagen Hydrolysate POWD 1 scoop by Does  not apply route daily.  . Cyanocobalamin (VITAMIN B-12) 5000 MCG TBDP Take 5,000 mcg by mouth daily.  . cycloSPORINE (RESTASIS) 0.05 % ophthalmic emulsion Place 1 drop into both eyes 2 (two) times daily.   . fluticasone (FLONASE) 50 MCG/ACT nasal spray Place 1 spray into both nostrils 3 (three) times daily.  . furosemide (LASIX) 20 MG tablet Take 1 tablet (20 mg total) by mouth daily. Use as needed for swelling  . ipratropium (ATROVENT HFA) 17 MCG/ACT inhaler Inhale 2 puffs into the lungs every 6 (six) hours as needed for wheezing.  Marland Kitchen. loratadine (CLARITIN)  10 MG tablet Take 10 mg by mouth daily.  Marland Kitchen. LORazepam (ATIVAN) 0.5 MG tablet Take 1/2- 1 tablet by mouth every 8-12 hours prn only  . Multiple Vitamin (MULTIVITAMIN) capsule Take 1 capsule by mouth every morning.   . Nasal Moisturizer Combination (RHINASE) SOLN Place 2 sprays into the nose 2 (two) times daily.  . pantoprazole (PROTONIX) 40 MG tablet TAKE 1 TABLET (40 MG TOTAL) BY MOUTH DAILY. TAKE 30-60 MIN BEFORE FIRST MEAL OF THE DAY  . PREBIOTIC PRODUCT PO Take 1 scoop by mouth daily.  . ranitidine (ZANTAC) 150 MG tablet Take 1 tablet (150 mg total) by mouth 2 (two) times daily.  . [DISCONTINUED] predniSONE (DELTASONE) 10 MG tablet 1 tab daily X2 weeks, then 0.5 tab daily  . predniSONE (DELTASONE) 5 MG tablet Take 1 tablet (5 mg total) by mouth daily with breakfast.   No facility-administered encounter medications on file as of 08/28/2016.      Review of Systems  Constitutional:   No  weight loss, night sweats,  Fevers, chills, + fatigue, or  lassitude.  HEENT:   No headaches,  Difficulty swallowing,  Tooth/dental problems, or  Sore throat,                No sneezing, itching, ear ache, nasal congestion, post nasal drip,   CV:  No chest pain,  Orthopnea, PND, swelling in lower extremities, anasarca, dizziness, palpitations, syncope.   GI  No heartburn, indigestion, abdominal pain, nausea, vomiting, diarrhea, change in bowel habits, loss of appetite, bloody stools.   Resp:    No chest wall deformity  Skin: no rash or lesions.  GU: no dysuria, change in color of urine, no urgency or frequency.  No flank pain, no hematuria   MS:  No joint pain or swelling.  No decreased range of motion.  No back pain.    Physical Exam  BP (!) 142/78 (BP Location: Left Arm, Cuff Size: Large)   Pulse (!) 112   Ht 5\' 5"  (1.651 m)   Wt 223 lb (101.2 kg)   SpO2 93%   BMI 37.11 kg/m   GEN: A/Ox3; pleasant , NAD, obese on O2    HEENT:  Nathalie/AT,  EACs-clear, TMs-wnl, NOSE-clear, THROAT-clear, no  lesions, no postnasal drip or exudate noted.   NECK:  Supple w/ fair ROM; no JVD; normal carotid impulses w/o bruits; no thyromegaly or nodules palpated; no lymphadenopathy.    RESP  Clear  P & A; w/o, wheezes/ rales/ or rhonchi. no accessory muscle use, no dullness to percussion  CARD:  RRR, no m/r/g, no peripheral edema, pulses intact, no cyanosis or clubbing.  GI:   Soft & nt; nml bowel sounds; no organomegaly or masses detected.   Musco: Warm bil, no deformities or joint swelling noted.   Neuro: alert, no focal deficits noted.    Skin: Warm, no lesions or rashes  Psych:  No change in mood or affect. No depression or anxiety.  No memory loss.  Lab Results:  CBC    Component Value Date/Time   WBC 5.4 06/27/2016 1544   RBC 3.91 06/27/2016 1544   HGB 11.7 06/27/2016 1544   HCT 35.6 06/27/2016 1544   PLT 239 06/27/2016 1544   MCV 91.0 06/27/2016 1544   MCH 29.9 06/27/2016 1544   MCHC 32.9 06/27/2016 1544   RDW 14.0 06/27/2016 1544   LYMPHSABS 2,268 06/27/2016 1544   MONOABS 216 06/27/2016 1544   EOSABS 378 06/27/2016 1544   BASOSABS 108 06/27/2016 1544    BMET    Component Value Date/Time   NA 141 06/27/2016 1544   K 4.0 06/27/2016 1544   CL 103 06/27/2016 1544   CO2 28 06/27/2016 1544   GLUCOSE 94 06/27/2016 1544   BUN 10 06/27/2016 1544   CREATININE 1.11 (H) 06/27/2016 1544   CALCIUM 9.5 06/27/2016 1544   GFRNONAA 49 (L) 06/06/2016 0443   GFRAA 56 (L) 06/06/2016 0443    BNP    Component Value Date/Time   BNP 95.0 06/05/2016 1520    ProBNP    Component Value Date/Time   PROBNP 274.4 (H) 03/08/2014 0147    Imaging: No results found.   Assessment & Plan:   Mild persistent asthma in adult without complication with component of vcd Controlled on current regimen   Plan  Patient Instructions  Remain on Symbicort 2 puffs Twice daily  , rinse after use Saline nasal rinses As needed   Mucinex DM Twice daily  As needed  Cough/congeston  Continue on  Oxygen 2l/m .  Remain on prednisone 5mg  daily  Low sweet diet.  [place] Vassie [place]  [place] [facility] [place] [facility] [place] and As needed        Chronic respiratory failure with hypoxia Cont on o2 -well controlled  Waiting on new POC , simply go   PULMONARY SARCOIDOSIS Progressive changes on CT chest and worsening PFT /DLCO  Will keep on low dose prednisone 5mg  for now  Steroid pt educaiton  No sign PAH on echo , will need repeat in 1 yr.   Plan  Patient Instructions  Remain on Symbicort 2 puffs Twice daily  , rinse after use Saline nasal rinses As needed   Mucinex DM Twice daily  As needed  Cough/congeston  Continue on Oxygen 2l/m .  Remain on prednisone 5mg  daily  Low sweet diet.  [facility] and As needed           Rubye Oaksammy Addelyn Alleman, NP 08/28/2016

## 2016-08-28 NOTE — Assessment & Plan Note (Signed)
Controlled on current regimen   Plan  Patient Instructions  Remain on Symbicort 2 puffs Twice daily  , rinse after use Saline nasal rinses As needed   Mucinex DM Twice daily  As needed  Cough/congeston  Continue on Oxygen 2l/m .  Remain on prednisone 5mg  daily  Low sweet diet.  [place] [place]  [place] [facility] and As needed

## 2016-08-28 NOTE — Assessment & Plan Note (Signed)
Progressive changes on CT chest and worsening PFT /DLCO  Will keep on low dose prednisone 5mg  for now  Steroid pt educaiton  No sign PAH on echo , will need repeat in 1 yr.   Plan  Patient Instructions  Remain on Symbicort 2 puffs Twice daily  , rinse after use Saline nasal rinses As needed   Mucinex DM Twice daily  As needed  Cough/congeston  Continue on Oxygen 2l/m .  Remain on prednisone 5mg  daily  Low sweet diet.  [place] [facility] [place] and As needed

## 2016-08-28 NOTE — Patient Instructions (Addendum)
Remain on Symbicort 2 puffs Twice daily  , rinse after use Saline nasal rinses As needed   Mucinex DM Twice daily  As needed  Cough/congeston  Continue on Oxygen 2l/m .  Remain on prednisone 5mg  daily  Low sweet diet.  [facility] [place] [facility]  [place] [facility] months and As needed

## 2016-08-28 NOTE — Assessment & Plan Note (Signed)
Cont on o2 -well controlled  Waiting on new POC , simply go

## 2016-09-02 NOTE — Progress Notes (Signed)
Reviewed & agree with plan  

## 2016-09-03 ENCOUNTER — Other Ambulatory Visit (HOSPITAL_COMMUNITY)
Admission: RE | Admit: 2016-09-03 | Discharge: 2016-09-03 | Disposition: A | Discharge status: Home / Self Care | Payer: Medicare Other | Source: Ambulatory Visit | Attending: Family Medicine | Admitting: Family Medicine

## 2016-09-03 ENCOUNTER — Ambulatory Visit (INDEPENDENT_AMBULATORY_CARE_PROVIDER_SITE_OTHER): Payer: Medicare Other | Admitting: Family Medicine

## 2016-09-03 ENCOUNTER — Encounter: Payer: Self-pay | Admitting: Family Medicine

## 2016-09-03 VITALS — BP 122/73 | HR 122 | Temp 99.2°F | Resp 16 | Ht 65.0 in | Wt 221.2 lb

## 2016-09-03 DIAGNOSIS — Z1211 Encounter for screening for malignant neoplasm of colon: Secondary | ICD-10-CM

## 2016-09-03 DIAGNOSIS — Z13220 Encounter for screening for lipoid disorders: Secondary | ICD-10-CM

## 2016-09-03 DIAGNOSIS — R635 Abnormal weight gain: Secondary | ICD-10-CM

## 2016-09-03 DIAGNOSIS — Z01419 Encounter for gynecological examination (general) (routine) without abnormal findings: Secondary | ICD-10-CM | POA: Diagnosis not present

## 2016-09-03 DIAGNOSIS — Z1151 Encounter for screening for human papillomavirus (HPV): Secondary | ICD-10-CM | POA: Diagnosis present

## 2016-09-03 DIAGNOSIS — Z119 Encounter for screening for infectious and parasitic diseases, unspecified: Secondary | ICD-10-CM | POA: Diagnosis not present

## 2016-09-03 DIAGNOSIS — Z0000 Encounter for general adult medical examination without abnormal findings: Secondary | ICD-10-CM | POA: Diagnosis not present

## 2016-09-03 LAB — HEPATITIS C ANTIBODY: HCV Ab: NEGATIVE

## 2016-09-03 NOTE — Progress Notes (Signed)
[facility] [address], [address] [place], [place] [address] [phone] [phone]  Date:  09/03/2016   Name:  Kiara SeedsBarbara V Biever   DOB:  July 12, 1951   MRN:  [id number]  PCP:  Abbe AmsterdamOPLAND,Vincie Linn, MD    Chief Complaint: Annual Exam (with pap) and Medication Refill (furosemide, lorazepam, and Protonix)   History of Present Illness:  Kiara [name] is a 65 y.o. very pleasant female patient who presents with the following:  Here today for a CPE She has a history of asthma, pulmonary sarcoidosis with chronic resp failure. She is on chronic O2.   Saw her pulmonologist on 12/21-  They are getting her a new home oxygen machine, she is also on symbicort, low dose oral prednisone.    She is well overdue for a cholesterol panel, and also needs a Hep C screening test today Mammo, pneumonia shot, flu shot, tdap, cologuard are all UTD  Last pap was in 2014- no history of abnl as far as she knows.    They are getting her a continuous- not pulsed- O2 machine that they hope will work better for her. Right now she is getting SOB even with just walking around a bit.  She has been going downhill in this regard for a good while- no acute change noted   She had wheezing a couple of days ago, and again today she had coughing and wheezing as her husband was frying bacon on the stove and the smoke irritated her.  She has atrovent inhaled to use as needed- she does not really use albuterol at this time  She ate oatmeal only this am  Wt Readings from Last 3 Encounters:  09/03/16 221 lb 3.2 oz (100.3 kg)  08/28/16 223 lb (101.2 kg)  07/24/16 218 lb (98.9 kg)   She has been on prednisone for about 3 months now.  She is frustrated by weight gain but understands it may be related to her medications    Patient Active Problem List   Diagnosis Date Noted  . SOB (shortness of breath) 06/05/2016  . Osteopenia 02/12/2016  . Lumbar radiculopathy 01/26/2015  . Chronic  respiratory failure with hypoxia (HCC) 04/12/2014  . Obesity (BMI 30-39.9) 02/08/2014  . Allergic rhinitis 09/05/2010  . IBS 09/05/2010  . GERD 02/18/2010  . Barrett's esophagus 02/12/2010  . Upper airway cough syndrome 11/02/2009  . Anxiety state 08/29/2008  . History of colonic polyps 08/29/2008  . PULMONARY SARCOIDOSIS 08/12/2007  . Mild persistent asthma in adult without complication with component of vcd 08/12/2007  . Diaphragmatic hernia 08/12/2007  . Migraine 08/12/2007    Past Medical History:  Diagnosis Date  . Anxiety   . Asthma   . Barrett esophagus   . GERD (gastroesophageal reflux disease)   . Hiatal hernia   . IBS (irritable bowel syndrome)   . Migraines   . Pulmonary sarcoidosis [facility](HCC)     Past Surgical History:  Procedure Laterality Date  . BRAVO PH STUDY N/A 10/10/2013   Procedure: BRAVO PH STUDY;  Surgeon: Hart Carwinora M Brodie, MD;  Location: [facility]COPY;  Service: Endoscopy;  Laterality: N/A;  placed 27  . BRONCHIAL BIOPSY  1996  . CATARACT EXTRACTION, BILATERAL    . CHOLECYSTECTOMY    . ESOPHAGOGASTRODUODENOSCOPY N/A 10/10/2013   Procedure: ESOPHAGOGASTRODUODENOSCOPY (EGD);  Surgeon: Hart Carwinora M Brodie, MD;  [facility];  Service: Endoscopy;  Laterality: N/A;  pt asthmatic, RA 92% at rest.  Coughing frequently.  Wheezes noted in upper lung fields at auscultation. Pt instructed to use home inhaler prior to procedure and placed on supportive O2 @ 2 lpm in pre-procedural. Teaching done ie. use of inhaler. Pt states she ju  . NISSEN FUNDOPLICATION    . ROTATOR CUFF REPAIR     x3(2 on L, 1 on R)    Social History  Substance Use Topics  . Smoking status: Former Smoker    Packs/day: 1.00    Years: 20.00    Types: Cigarettes    Quit date: 09/08/1978  . Smokeless tobacco: Never Used  . Alcohol use No    Family History  Problem Relation Age of Onset  . Adopted: Yes    Allergies  Allergen Reactions  . Gabapentin     Edema on 2 separate trials  . Lactose  Intolerance (Gi)   . Penicillins Other (See Comments)    Unknown - childhood allergy  . Pineapple Hives and Itching  . Aspirin Other (See Comments)    gastritis    Medication list has been reviewed and updated.  Current Outpatient Prescriptions on File Prior to Visit  Medication Sig Dispense Refill  . ALOE VERA JUICE PO Take 4 oz by mouth daily.    Marland Kitchen. amitriptyline (ELAVIL) 10 MG tablet Take 1 tablet (10 mg total) by mouth at bedtime. (Patient taking differently: Take 10 mg by mouth at bedtime as needed. ) 90 tablet 3  . Ascorbic Acid (VITAMIN C) 1000 MG tablet Take 1,000 mg by mouth every morning.     . ASTRAGALUS PO Take 1 tablet by mouth every morning.     . budesonide-formoterol (SYMBICORT) 160-4.5 MCG/ACT inhaler Inhale 2 puffs into the lungs 2 (two) times daily as needed. 1 Inhaler 3  . Calcium-Magnesium-Vitamin D (CALCIUM 1200+D3 PO) Take by mouth.    . Collagen Hydrolysate POWD 1 scoop by Does not apply route daily.    . Cyanocobalamin (VITAMIN B-12) 5000 MCG TBDP Take 5,000 mcg by mouth daily.    . cycloSPORINE (RESTASIS) 0.05 % ophthalmic emulsion Place 1 drop into both eyes 2 (two) times daily.     . fluticasone (FLONASE) 50 MCG/ACT nasal spray Place 1 spray into both nostrils 3 (three) times daily.    . furosemide (LASIX) 20 MG tablet Take 1 tablet (20 mg total) by mouth daily. Use as needed for swelling 90 tablet 1  . ipratropium (ATROVENT HFA) 17 MCG/ACT inhaler Inhale 2 puffs into the lungs every 6 (six) hours as needed for wheezing. 1 Inhaler 1  . loratadine (CLARITIN) 10 MG tablet Take 10 mg by mouth daily.    Marland Kitchen. LORazepam (ATIVAN) 0.5 MG tablet Take 1/2- 1 tablet by mouth every 8-12 hours prn only 30 tablet 0  . Multiple Vitamin (MULTIVITAMIN) capsule Take 1 capsule by mouth every morning.     . Nasal Moisturizer Combination (RHINASE) SOLN Place 2 sprays into the nose 2 (two) times daily.    . pantoprazole (PROTONIX) 40 MG tablet TAKE 1 TABLET (40 MG TOTAL) BY MOUTH DAILY.  TAKE 30-60 MIN BEFORE FIRST MEAL OF THE DAY 30 tablet 2  . PREBIOTIC PRODUCT PO Take 1 scoop by mouth daily.    . predniSONE (DELTASONE) 5 MG tablet Take 1 tablet (5 mg total) by mouth daily with breakfast. 30 tablet 3  . ranitidine (ZANTAC) 150 MG tablet Take 1 tablet (150 mg total) by mouth 2 (two) times daily. 120 tablet 3   No current facility-administered medications on file prior to  visit.     Review of Systems:  As per HPI- otherwise negative.  No fever or chills Tachycardia at baseline- per pt when she she is quiet at home her pulse may drop into the 80s but is higher when she exerts herself at all No CP however   Physical Examination: Blood pressure 122/73, pulse (!) 122, temperature 99.2 F (37.3 C), temperature source Oral, resp. rate 16, height 5\' 5"  (1.651 m), weight 221 lb 3.2 oz (100.3 kg), SpO2 97 %.  Vitals:   09/03/16 1324  Weight: 221 lb 3.2 oz (100.3 kg)  Height: 5\' 5"  (1.651 m)   Body mass index is 36.81 kg/m. Ideal Body Weight: Weight in (lb) to have BMI = 25: 149.9  GEN: WDWN, NAD, Non-toxic, A & O x 3, overweight, looks well, wearing nasal canula O2 HEENT: Atraumatic, Normocephalic. Neck supple. No masses, No LAD.  Bilateral TM wnl, oropharynx normal.  PEERL,EOMI.   Ears and Nose: No external deformity. CV: RRR, No M/G/R. No JVD. No thrill. No extra heart sounds. PULM: CTA B, no wheezes, crackles, rhonchi. No retractions. No resp. distress. No accessory muscle use. ABD: S, NT, ND. No rebound. No HSM. EXTR: No c/c/e NEURO Normal gait.  PSYCH: Normally interactive. Conversant. Not depressed or anxious appearing.  Calm demeanor.  Breast: normal exam, no masses/ dimpling/ discharge Pelvic: normal, no vaginal lesions or discharge. Uterus normal, no CMT, no adnexal tendereness or masses    Pulse Readings from Last 3 Encounters:  09/03/16 (!) 122  08/28/16 (!) 112  07/24/16 (!) 125    Assessment and Plan: Physical exam  Weight gain - Plan:  TSH  Screening examination for infectious disease - Plan: Hepatitis C antibody  Screening for hyperlipidemia - Plan: Lipid panel  Screening for colon cancer - Plan: Cytology - PAP  Here today for a CPE Pap pending. Also labs as above She has chronic resp failure on oxygen- this is steadily getting worse and she is getting a new 02 machine soon.   Defer zostavax as she is on prednisone currently  Will plan further follow- up pending labs.   Signed Abbe Amsterdam, MD

## 2016-09-03 NOTE — Progress Notes (Signed)
Pre visit review using our clinic review tool, if applicable. No additional management support is needed unless otherwise documented below in the visit note. 

## 2016-09-03 NOTE — Patient Instructions (Addendum)
I hope that you get your new oxygen machine soon Let me know if I can do anything to help you  You can certainly try using zyrtec or allegra instead of your claritin and see if it is more helpful I will be in touch with your labs/ pap asap I am glad to remove that mole on your back at your convenience- however it does not appear suspicious to me

## 2016-09-04 ENCOUNTER — Encounter: Payer: Self-pay | Admitting: Family Medicine

## 2016-09-04 LAB — LIPID PANEL
Cholesterol: 187 mg/dL (ref 0–200)
HDL: 75.5 mg/dL (ref >39.00)
LDL CALC: 91 mg/dL (ref 0–99)
NONHDL: 111.72
Total CHOL/HDL Ratio: 2
Triglycerides: 106 mg/dL (ref 0.0–149.0)
VLDL: 21.2 mg/dL (ref 0.0–40.0)

## 2016-09-04 LAB — CYTOLOGY - PAP
Diagnosis: NEGATIVE
HPV: NOT DETECTED

## 2016-09-04 LAB — TSH: TSH: 1.38 u[IU]/mL (ref 0.35–4.50)

## 2016-09-18 ENCOUNTER — Other Ambulatory Visit: Payer: Self-pay | Admitting: Family Medicine

## 2016-09-18 DIAGNOSIS — R600 Localized edema: Secondary | ICD-10-CM

## 2016-09-18 DIAGNOSIS — F411 Generalized anxiety disorder: Secondary | ICD-10-CM

## 2016-09-18 NOTE — Telephone Encounter (Signed)
There are no entries in [facility] for this pt

## 2016-09-18 NOTE — Telephone Encounter (Signed)
Received refill request for LORazepam (ATIVAN) 0.5 MG tablet. Last office visit 09/03/16 and last refill 02/07/16. Is it ok to refill? Please advise.

## 2016-09-24 DIAGNOSIS — J45909 Unspecified asthma, uncomplicated: Secondary | ICD-10-CM | POA: Diagnosis not present

## 2016-09-24 DIAGNOSIS — D869 Sarcoidosis, unspecified: Secondary | ICD-10-CM | POA: Diagnosis not present

## 2016-10-03 ENCOUNTER — Other Ambulatory Visit: Payer: Self-pay | Admitting: Gastroenterology

## 2016-10-25 DIAGNOSIS — D869 Sarcoidosis, unspecified: Secondary | ICD-10-CM | POA: Diagnosis not present

## 2016-10-25 DIAGNOSIS — J45909 Unspecified asthma, uncomplicated: Secondary | ICD-10-CM | POA: Diagnosis not present

## 2016-11-07 ENCOUNTER — Other Ambulatory Visit: Payer: Self-pay | Admitting: Internal Medicine

## 2016-11-12 ENCOUNTER — Other Ambulatory Visit: Payer: Self-pay | Admitting: *Deleted

## 2016-11-19 ENCOUNTER — Ambulatory Visit (INDEPENDENT_AMBULATORY_CARE_PROVIDER_SITE_OTHER): Payer: Medicare Other | Admitting: Family Medicine

## 2016-11-19 VITALS — BP 112/82 | HR 126 | Temp 98.2°F | Ht 65.0 in | Wt 233.8 lb

## 2016-11-19 DIAGNOSIS — K439 Ventral hernia without obstruction or gangrene: Secondary | ICD-10-CM | POA: Diagnosis not present

## 2016-11-19 DIAGNOSIS — F321 Major depressive disorder, single episode, moderate: Secondary | ICD-10-CM | POA: Diagnosis not present

## 2016-11-19 DIAGNOSIS — D860 Sarcoidosis of lung: Secondary | ICD-10-CM | POA: Diagnosis not present

## 2016-11-19 DIAGNOSIS — R0902 Hypoxemia: Secondary | ICD-10-CM

## 2016-11-19 MED ORDER — ALBUTEROL SULFATE HFA 108 (90 BASE) MCG/ACT IN AERS: 2.0000 | INHALATION_SPRAY | RESPIRATORY_TRACT | 3 refills | Status: DC | PRN

## 2016-11-19 MED ORDER — BUPROPION HCL ER (SR) 150 MG PO TB12: 150.0000 mg | ORAL_TABLET | Freq: Two times a day (BID) | ORAL | 6 refills | Status: DC

## 2016-11-19 NOTE — Patient Instructions (Signed)
It was very nice to see you today- take care and please let me know how you do with the Wellbutrin Take a 1/2 dose of your amitriptyline at bedtime for 2-3 days, then you can stop it entirely Then start on your wellbutrin once a day for 3 days, then increase to twice a day  Please let me know how this is working for you over mychart in about one month I am sorry that you are having such a hard time- let me know if I can otherwise be helpful to you!

## 2016-11-19 NOTE — Progress Notes (Signed)
Healthcare at [facility] [address] [place], [place] [address] [phone]  Date:  11/19/2016   Name:  [name]   DOB:  08-02-1951   MRN:  [id number]  PCP:  Abbe Amsterdam, MD    Chief Complaint: Follow-up (Pt here for f/u visit. Will need refill on inhaler)   History of Present Illness:  [name] is a 66 y.o. very pleasant female patient who presents with the following:  Here today for a follow-up visit Last seen by myself in December with following partial HPI:  She has a history of asthma, pulmonary sarcoidosis with chronic resp failure. She is on chronic O2.   Saw her pulmonologist on 12/21-  They are getting her a new home oxygen machine, she is also on symbicort, low dose oral prednisone.    She got her continuous oxygen machine which she likes ok- however the battery does not last that long so it needs to be plugged in most of the time  She notes that she cannot breahe well when she bends forward- has noted a bulge in her abdomen which she suspects is a hernia  She notes that her breathing is not as good as it was- she is seeing pulmonology later on this month, in about one week She is on Symbicort. Albuterol as needed, prednisone daily  She has been on oxygen for about 3 years now.  She is using 2L day and night. She may increase her level when she is physically active  She does sometimes does feel down and depressed- she does not like how her lungs hold her back from doing wheat she wants to do.  She finds that she does not feel like doing much because it is just so much effort to do anything.  Remarks that it can take her hours just to be able to leave the house- getting dressed and ready to go takes a tremendous amount of energy for her.  She has gained some weight and her clothes don't fit her well She denies any SI but would like to start on medication for depression She is currently taking 10 mg of  amitriptyline at bedtime some days, but would be ok with stopping this  Pulse Readings from Last 3 Encounters:  11/19/16 (!) 126  09/03/16 (!) 122  08/28/16 (!) 112    Patient Active Problem List   Diagnosis Date Noted  . SOB (shortness of breath) 06/05/2016  . Osteopenia 02/12/2016  . Lumbar radiculopathy 01/26/2015  . Chronic respiratory failure with hypoxia (HCC) 04/12/2014  . Obesity (BMI 30-39.9) 02/08/2014  . Allergic rhinitis 09/05/2010  . IBS 09/05/2010  . GERD 02/18/2010  . Barrett's esophagus 02/12/2010  . Upper airway cough syndrome 11/02/2009  . Anxiety state 08/29/2008  . History of colonic polyps 08/29/2008  . PULMONARY SARCOIDOSIS 08/12/2007  . Mild persistent asthma in adult without complication with component of vcd 08/12/2007  . Diaphragmatic hernia 08/12/2007  . Migraine 08/12/2007    Past Medical History:  Diagnosis Date  . Anxiety   . Asthma   . Barrett esophagus   . GERD (gastroesophageal reflux disease)   . Hiatal hernia   . IBS (irritable bowel syndrome)   . Migraines   . Pulmonary sarcoidosis [facility])     Past Surgical History:  Procedure Laterality Date  . BRAVO PH STUDY N/A 10/10/2013   Procedure: BRAVO PH STUDY;  Surgeon: Hart Carwin, MD;  Location:  [facility];  Service: Endoscopy;  Laterality: N/A;  placed 27  . BRONCHIAL BIOPSY  1996  . CATARACT EXTRACTION, BILATERAL    . CHOLECYSTECTOMY    . ESOPHAGOGASTRODUODENOSCOPY N/A 10/10/2013   Procedure: ESOPHAGOGASTRODUODENOSCOPY (EGD);  Surgeon: Hart Carwinora M Brodie, MD;  [facility];  Service: Endoscopy;  Laterality: N/A;  pt asthmatic, RA 92% at rest.  Coughing frequently. Wheezes noted in upper lung fields at auscultation. Pt instructed to use home inhaler prior to procedure and placed on supportive O2 @ 2 lpm in pre-procedural. Teaching done ie. use of inhaler. Pt states she ju  . NISSEN FUNDOPLICATION    . ROTATOR CUFF REPAIR     x3(2 on L, 1 on R)    Social History  Substance Use  Topics  . Smoking status: Former Smoker    Packs/day: 1.00    Years: 20.00    Types: Cigarettes    Quit date: 09/08/1978  . Smokeless tobacco: Never Used  . Alcohol use No    Family History  Problem Relation Age of Onset  . Adopted: Yes    Allergies  Allergen Reactions  . Gabapentin     Edema on 2 separate trials  . Lactose Intolerance (Gi)   . Penicillins Other (See Comments)    Unknown - childhood allergy  . Pineapple Hives and Itching  . Aspirin Other (See Comments)    gastritis    Medication list has been reviewed and updated.  Current Outpatient Prescriptions on File Prior to Visit  Medication Sig Dispense Refill  . ALOE VERA JUICE PO Take 4 oz by mouth daily.    Marland Kitchen. amitriptyline (ELAVIL) 10 MG tablet Take 1 tablet (10 mg total) by mouth at bedtime. (Patient taking differently: Take 10 mg by mouth at bedtime as needed. ) 90 tablet 3  . Ascorbic Acid (VITAMIN C) 1000 MG tablet Take 1,000 mg by mouth every morning.     . ASTRAGALUS PO Take 1 tablet by mouth every morning.     . Calcium-Magnesium-Vitamin D (CALCIUM 1200+D3 PO) Take by mouth.    . Collagen Hydrolysate POWD 1 scoop by Does not apply route daily.    . Cyanocobalamin (VITAMIN B-12) 5000 MCG TBDP Take 5,000 mcg by mouth daily.    . cycloSPORINE (RESTASIS) 0.05 % ophthalmic emulsion Place 1 drop into both eyes 2 (two) times daily.     . fluticasone (FLONASE) 50 MCG/ACT nasal spray Place 1 spray into both nostrils 3 (three) times daily.    . furosemide (LASIX) 20 MG tablet TAKE 1 TABLET (20 MG TOTAL) BY MOUTH DAILY. USE AS NEEDED FOR SWELLING 90 tablet 1  . ipratropium (ATROVENT HFA) 17 MCG/ACT inhaler Inhale 2 puffs into the lungs every 6 (six) hours as needed for wheezing. 1 Inhaler 1  . loratadine (CLARITIN) 10 MG tablet Take 10 mg by mouth daily.    Marland Kitchen. LORazepam (ATIVAN) 0.5 MG tablet TAKE 1/2 TO 1 TABLET BY MOUTH EVERY 8 TO 12 HRS ONLY AS NEEDED 30 tablet 0  . Multiple Vitamin (MULTIVITAMIN) capsule Take 1  capsule by mouth every morning.     . Nasal Moisturizer Combination (RHINASE) SOLN Place 2 sprays into the nose 2 (two) times daily.    . pantoprazole (PROTONIX) 40 MG tablet TAKE 1 TABLET (40 MG TOTAL) BY MOUTH DAILY. TAKE 30-60 MIN BEFORE FIRST MEAL OF THE DAY 30 tablet 2  . PREBIOTIC PRODUCT PO Take 1 scoop by mouth daily.    . predniSONE (DELTASONE) 5 MG  tablet Take 1 tablet (5 mg total) by mouth daily with breakfast. 30 tablet 3  . ranitidine (ZANTAC) 150 MG tablet TAKE 1 TABLET (150 MG TOTAL) BY MOUTH 2 (TWO) TIMES DAILY. 120 tablet 2  . SYMBICORT 160-4.5 MCG/ACT inhaler INHALE 2 PUFF INTO THE LUNGS TWICE A DAY AS NEEDED 10.2 Inhaler 2   No current facility-administered medications on file prior to visit.     Review of Systems:  As per HPI- otherwise negative. She still drive     Physical Examination: Vitals:   11/19/16 1037  BP: 112/82  Pulse: (!) 126  Temp: 98.2 F (36.8 C)   Vitals:   11/19/16 1037  Weight: 233 lb 12.8 oz (106.1 kg)  Height: 5\' 5"  (1.651 m)   Body mass index is 38.91 kg/m. Ideal Body Weight: Weight in (lb) to have BMI = 25: 149.9  GEN: WDWN, NAD, Non-toxic, A & O x 3, obese, using portable oxygen tank HEENT: Atraumatic, Normocephalic. Neck supple. No masses, No LAD. Ears and Nose: No external deformity. CV: RRR, No M/G/R. No JVD. No thrill. No extra heart sounds. PULM: mild expiratory wheeze bilaterally.. No retractions. No resp. distress. No accessory muscle use. ABD: S, NT, ND, +BS. No rebound. No HSM. She does have a ventral bulge superior to her umbilicus which is likely a ventral hernia. It is not acutely tender EXTR: No c/c/e NEURO Normal gait.  PSYCH: Normally interactive. Conversant. Not depressed or anxious appearing.  Calm demeanor.    Assessment and Plan: Pulmonary sarcoidosis (HCC) - Plan: albuterol (VENTOLIN HFA) 108 (90 Base) MCG/ACT inhaler  Oxygen desaturation  Depression, major, single episode, moderate (HCC) - Plan:  buPROPion (WELLBUTRIN SR) 150 MG 12 hr tablet  Ventral hernia without obstruction or gangrene  Here today to follow-up on a few concerns She is doing ok with her oxygen but is getting more and more frustrated about her overall health and mobility.  She is becoming depressed.  Will start her on Wellbutrin and she will update me in about one month discussed her hernia- given her respiratory problems I would not advise her to consider surgery at this time.  She understands and will seek care if this is getting worse or if acutely painful   Signed Abbe Amsterdam, MD

## 2016-11-19 NOTE — Progress Notes (Signed)
Pre visit review using our clinic review tool, if applicable. No additional management support is needed unless otherwise documented below in the visit note. 

## 2016-11-22 DIAGNOSIS — J45909 Unspecified asthma, uncomplicated: Secondary | ICD-10-CM | POA: Diagnosis not present

## 2016-11-22 DIAGNOSIS — D869 Sarcoidosis, unspecified: Secondary | ICD-10-CM | POA: Diagnosis not present

## 2016-11-26 ENCOUNTER — Ambulatory Visit: Payer: Medicare Other | Admitting: Pulmonary Disease

## 2016-12-04 ENCOUNTER — Encounter: Payer: Self-pay | Admitting: Family Medicine

## 2016-12-04 ENCOUNTER — Ambulatory Visit: Payer: Medicare Other | Admitting: Adult Health

## 2016-12-11 ENCOUNTER — Other Ambulatory Visit: Payer: Self-pay | Admitting: Emergency Medicine

## 2016-12-11 DIAGNOSIS — D860 Sarcoidosis of lung: Secondary | ICD-10-CM

## 2016-12-11 MED ORDER — ALBUTEROL SULFATE HFA 108 (90 BASE) MCG/ACT IN AERS: 2.0000 | INHALATION_SPRAY | RESPIRATORY_TRACT | 2 refills | Status: DC | PRN

## 2016-12-11 NOTE — Telephone Encounter (Signed)
-----   Message from Cammy Copa, [place] sent at 12/11/2016  4:03 PM EDT ----- Regarding: bupropion Spoke to pt who states that bupropion is causing diarrhea and abd cramping. Please advise.

## 2016-12-11 NOTE — Telephone Encounter (Signed)
Called her - did not reach but LMOM.  Will try her back

## 2016-12-12 MED ORDER — BUPROPION HCL ER (XL) 300 MG PO TB24: 300.0000 mg | ORAL_TABLET | Freq: Every day | ORAL | 5 refills | Status: DC

## 2016-12-12 NOTE — Telephone Encounter (Signed)
-----   Message from Tanesha N Chandler, CMA sent at 12/11/2016  4:03 PM EDT ----- Regarding: bupropion Spoke to pt who states that bupropion is causing diarrhea and abd cramping. Please advise.  

## 2016-12-12 NOTE — Telephone Encounter (Signed)
Called her back- except for the GI symptoms she likes the wellbutrin, feels like it has helped her mood.  We decided to try the XL (24 hour) formulation for her in hopes that it will cause fewer SE.  She will let me know how this works  Meds ordered this encounter  Medications  . albuterol (VENTOLIN HFA) 108 (90 Base) MCG/ACT inhaler    Sig: Inhale 2 puffs into the lungs as needed.    Dispense:  1 Inhaler    Refill:  2  . buPROPion (WELLBUTRIN XL) 300 MG 24 hr tablet    Sig: Take 1 tablet (300 mg total) by mouth daily.    Dispense:  30 tablet    Refill:  5

## 2016-12-18 ENCOUNTER — Other Ambulatory Visit: Payer: Self-pay | Admitting: Family Medicine

## 2016-12-18 NOTE — Telephone Encounter (Signed)
Caller name: Relationship to patient: Self Can be reached: [phone]  Pharmacy:  Reason for call: Refill albuterol (VENTOLIN HFA) 108 (90 Base) MCG/ACT inhaler [[id number] Patient states her insurance company needs a PA for this medication.

## 2016-12-19 ENCOUNTER — Other Ambulatory Visit: Payer: Self-pay | Admitting: Emergency Medicine

## 2016-12-19 ENCOUNTER — Other Ambulatory Visit: Payer: Self-pay | Admitting: Gastroenterology

## 2016-12-19 NOTE — Telephone Encounter (Signed)
PA has been sent .Awaiting approval 

## 2016-12-22 NOTE — Telephone Encounter (Signed)
Received call from Jamie at [employer]-needing to know if Pt has hx of asthma (which she does) or COPD (no she does not), if Pt has tried and failed Proair (did not see in chart), and if Pt would be unable to try Proair due to adverse reaction/less effective (informed less effective). Asher Muir will turn in these answers to PA team.

## 2016-12-23 DIAGNOSIS — D869 Sarcoidosis, unspecified: Secondary | ICD-10-CM | POA: Diagnosis not present

## 2016-12-23 DIAGNOSIS — J45909 Unspecified asthma, uncomplicated: Secondary | ICD-10-CM | POA: Diagnosis not present

## 2017-01-04 ENCOUNTER — Other Ambulatory Visit: Payer: Self-pay | Admitting: Adult Health

## 2017-01-15 ENCOUNTER — Ambulatory Visit (INDEPENDENT_AMBULATORY_CARE_PROVIDER_SITE_OTHER): Payer: Medicare Other | Admitting: Pulmonary Disease

## 2017-01-15 ENCOUNTER — Encounter: Payer: Self-pay | Admitting: Pulmonary Disease

## 2017-01-15 DIAGNOSIS — D869 Sarcoidosis, unspecified: Secondary | ICD-10-CM | POA: Diagnosis not present

## 2017-01-15 DIAGNOSIS — J4530 Mild persistent asthma, uncomplicated: Secondary | ICD-10-CM

## 2017-01-15 DIAGNOSIS — K449 Diaphragmatic hernia without obstruction or gangrene: Secondary | ICD-10-CM | POA: Diagnosis not present

## 2017-01-15 DIAGNOSIS — J9611 Chronic respiratory failure with hypoxia: Secondary | ICD-10-CM | POA: Diagnosis not present

## 2017-01-15 MED ORDER — PREDNISONE 1 MG PO TABS
1.0000 mg | ORAL_TABLET | Freq: Every day | ORAL | 0 refills | Status: DC
Start: 2017-01-15 — End: 2017-04-18

## 2017-01-15 NOTE — Addendum Note (Signed)
Addended by: Maurene CapesPOTTS, Jathen Sudano M on: 01/15/2017 02:33 PM   Modules accepted: Orders

## 2017-01-15 NOTE — Progress Notes (Signed)
   Subjective:    Patient ID: [name], female    DOB: 02-Dec-1950, 66 y.o.   MRN: [id number]  HPI  [age] yobf For FU of  mild persistent asthma and sarcoidosis -quit smoking in 1980 and dx with sarcoidosis in 1996 She has been maintained on oxygen since 2015  Chief Complaint  Patient presents with  . Follow-up    5 month follow up. Wants to discuss stopping prednisone   She was able to get a portable concentrator and [place] the trip [place] daughter[place]. She remains on 5 mg prednisone We reviewed her PFTs and CT scan today Dyspnea is at baseline and she does not desaturate on walking on 2 L continuous portable oxygen She does not have pedal edema, orthopnea or paroxysmal nocturnal dyspnea  She denies significant breakthrough reflux on Protonix but continues to have daily cough  Significant tests/ events  Pfts 08/22/2013 nl lung vols,  dlco 53 corrects to 73% - PFTs 05/16/2014  Nl lung vols,  DLCO 42% corrects to 67%   PFT 08/2017  shows FEV1 88%, ratio 87, FVC 78%, ++BD repsonse , DLCO 35% (down from 42%)  CT angio 05/2016 >> Diffuse mediastinal and bilateral hilar lymphadenopathy, Diffuse honeycombing and scarring   2-D echo on December 7 showed an EF of 60-65%, grade 1 diastolic dysfunction, pulmonary artery pressure 30 [email or web address] dilated aortic root  UGI 10/2013 > Trace reflux    Review of Systems neg for any significant sore throat, dysphagia, itching, sneezing, nasal congestion or excess/ purulent secretions, fever, chills, sweats, unintended wt loss, pleuritic or exertional cp, hempoptysis, orthopnea pnd or change in chronic leg swelling. Also denies presyncope, palpitations, heartburn, abdominal pain, nausea, vomiting, diarrhea or change in bowel or urinary habits, dysuria,hematuria, rash, arthralgias, visual complaints, headache, numbness weakness or ataxia.     Objective:   Physical Exam  Gen. Pleasant, obese, in no distress ENT - no lesions,  no post nasal drip Neck: No JVD, no thyromegaly, no carotid bruits Lungs: no use of accessory muscles, no dullness to percussion, decreased without rales or rhonchi  Cardiovascular: Rhythm regular, heart sounds  normal, no murmurs or gallops, no peripheral edema Musculoskeletal: No deformities, no cyanosis or clubbing , no tremors       Assessment & Plan:

## 2017-01-15 NOTE — Assessment & Plan Note (Signed)
Likely burned out sarcoidosis, prednisone can be tapered to off  Decrease prednisone to 5 mg on Monday/Wednesday/Friday/Sunday  On June 15, decreased to 1 mg prednisone daily  Stop taking prednisone on July 15

## 2017-01-15 NOTE — Assessment & Plan Note (Signed)
-   Continue Symbicort  

## 2017-01-15 NOTE — Assessment & Plan Note (Signed)
Upper GI series will be scheduled

## 2017-01-15 NOTE — Assessment & Plan Note (Signed)
Continue 2 L continuous on exertion

## 2017-01-15 NOTE — Patient Instructions (Addendum)
Decrease prednisone to 5 mg on Monday/Wednesday/Friday/Sunday  On June 15, decreased to 1 mg prednisone daily  Stop taking prednisone on July 15   Upper GI series will be scheduled

## 2017-01-22 DIAGNOSIS — J45909 Unspecified asthma, uncomplicated: Secondary | ICD-10-CM | POA: Diagnosis not present

## 2017-01-22 DIAGNOSIS — D869 Sarcoidosis, unspecified: Secondary | ICD-10-CM | POA: Diagnosis not present

## 2017-02-05 ENCOUNTER — Telehealth: Payer: Self-pay | Admitting: Pulmonary Disease

## 2017-02-05 NOTE — Telephone Encounter (Signed)
Spoke with pt who states her POC is only lasting 30 mins. She states she spoke with someone from [facility] who verified this. Pt is now requesting a lighter POC and she states she wants to switch to pulse instead of continuous. ATC [facility] to verify pt's information about the battery life and if they offer a smaller poc. [facility] was closed will need to call tomorrow

## 2017-02-06 NOTE — Telephone Encounter (Signed)
Will need to call [facility] on 02/09/2017 as they are now closed.

## 2017-02-09 NOTE — Telephone Encounter (Signed)
Called [facility] and spoke with Selena BattenKim, states that their POC dept is already working on switching machines for her, and at this time nothing is needed from our office regarding this.    Pt aware of status of order as well.  Nothing further needed.

## 2017-02-10 ENCOUNTER — Ambulatory Visit (HOSPITAL_COMMUNITY)
Admission: RE | Admit: 2017-02-10 | Discharge: 2017-02-10 | Disposition: A | Discharge status: Home / Self Care | Payer: Medicare Other | Source: Ambulatory Visit | Attending: Pulmonary Disease | Admitting: Pulmonary Disease

## 2017-02-10 DIAGNOSIS — K222 Esophageal obstruction: Secondary | ICD-10-CM | POA: Insufficient documentation

## 2017-02-10 DIAGNOSIS — K225 Diverticulum of esophagus, acquired: Secondary | ICD-10-CM | POA: Diagnosis not present

## 2017-02-10 DIAGNOSIS — D869 Sarcoidosis, unspecified: Secondary | ICD-10-CM | POA: Diagnosis not present

## 2017-02-10 DIAGNOSIS — K449 Diaphragmatic hernia without obstruction or gangrene: Secondary | ICD-10-CM | POA: Diagnosis present

## 2017-02-10 DIAGNOSIS — K219 Gastro-esophageal reflux disease without esophagitis: Secondary | ICD-10-CM | POA: Diagnosis not present

## 2017-02-16 ENCOUNTER — Telehealth: Payer: Self-pay | Admitting: Gastroenterology

## 2017-02-16 NOTE — Telephone Encounter (Signed)
Okay, may be good for me to see her in clinic and discuss further with her. She had no evidence of Barrett's on her last EGD and think this is very likely a benign stricture, although can't say for sure without looking with endoscopy. She can keep track of symptoms and maybe see me in a few months if she is willing. Thanks

## 2017-02-16 NOTE — Telephone Encounter (Signed)
Please see note from patient.

## 2017-02-16 NOTE — Telephone Encounter (Signed)
Barium swallow reviewed. She has a remote history of Barrett's but not noted on her last exam.  She has a mild stenosis noted at the GEJ, suspect a benign stricture, but tablet got caught up at the site. If she is having dysphagia I recommend she have an EGD with dilation. Given her supplemental oxygen use, this would need to be done at the hospital. Can you please book her for an EGD with dilation at next available time. (Of note please do not schedule this for tomorrow, I had a cancellation but don't want any patients added on as I was already overbooked). thanks

## 2017-02-16 NOTE — Telephone Encounter (Signed)
Patient very appreciative for looking at report. She states that she has intermittent problems with her larger tablets, but usually takes them with something soft (oatmeal). She said that she has not been bothered too badly, and would like to hold off right now in scheduling an EGD with dilation. She plans to keep a written record to help track of these events and will contact our office should she have increased difficulty.

## 2017-02-22 DIAGNOSIS — D869 Sarcoidosis, unspecified: Secondary | ICD-10-CM | POA: Diagnosis not present

## 2017-02-22 DIAGNOSIS — J45909 Unspecified asthma, uncomplicated: Secondary | ICD-10-CM | POA: Diagnosis not present

## 2017-03-14 ENCOUNTER — Other Ambulatory Visit: Payer: Self-pay | Admitting: Gastroenterology

## 2017-03-24 DIAGNOSIS — D869 Sarcoidosis, unspecified: Secondary | ICD-10-CM | POA: Diagnosis not present

## 2017-03-24 DIAGNOSIS — J45909 Unspecified asthma, uncomplicated: Secondary | ICD-10-CM | POA: Diagnosis not present

## 2017-03-28 ENCOUNTER — Other Ambulatory Visit: Payer: Self-pay | Admitting: Internal Medicine

## 2017-04-16 ENCOUNTER — Ambulatory Visit: Payer: Medicare Other | Admitting: Adult Health

## 2017-04-18 ENCOUNTER — Emergency Department (HOSPITAL_COMMUNITY): Payer: Medicare Other

## 2017-04-18 ENCOUNTER — Inpatient Hospital Stay (HOSPITAL_COMMUNITY): Payer: Medicare Other

## 2017-04-18 ENCOUNTER — Encounter (HOSPITAL_COMMUNITY): Payer: Self-pay

## 2017-04-18 ENCOUNTER — Inpatient Hospital Stay (HOSPITAL_COMMUNITY)
Admission: EM | Admit: 2017-04-18 | Discharge: 2017-05-12 | DRG: 199 | Disposition: A | Discharge status: Home Health | Payer: Medicare Other | Attending: Internal Medicine | Admitting: Internal Medicine

## 2017-04-18 DIAGNOSIS — Z4682 Encounter for fitting and adjustment of non-vascular catheter: Secondary | ICD-10-CM | POA: Diagnosis not present

## 2017-04-18 DIAGNOSIS — Z7951 Long term (current) use of inhaled steroids: Secondary | ICD-10-CM | POA: Diagnosis not present

## 2017-04-18 DIAGNOSIS — K5903 Drug induced constipation: Secondary | ICD-10-CM | POA: Diagnosis not present

## 2017-04-18 DIAGNOSIS — F329 Major depressive disorder, single episode, unspecified: Secondary | ICD-10-CM | POA: Diagnosis present

## 2017-04-18 DIAGNOSIS — I440 Atrioventricular block, first degree: Secondary | ICD-10-CM | POA: Diagnosis present

## 2017-04-18 DIAGNOSIS — K219 Gastro-esophageal reflux disease without esophagitis: Secondary | ICD-10-CM | POA: Diagnosis present

## 2017-04-18 DIAGNOSIS — F411 Generalized anxiety disorder: Secondary | ICD-10-CM | POA: Diagnosis present

## 2017-04-18 DIAGNOSIS — J4530 Mild persistent asthma, uncomplicated: Secondary | ICD-10-CM | POA: Diagnosis not present

## 2017-04-18 DIAGNOSIS — J209 Acute bronchitis, unspecified: Secondary | ICD-10-CM | POA: Diagnosis not present

## 2017-04-18 DIAGNOSIS — J9382 Other air leak: Secondary | ICD-10-CM | POA: Diagnosis not present

## 2017-04-18 DIAGNOSIS — Z87891 Personal history of nicotine dependence: Secondary | ICD-10-CM

## 2017-04-18 DIAGNOSIS — J939 Pneumothorax, unspecified: Secondary | ICD-10-CM | POA: Diagnosis not present

## 2017-04-18 DIAGNOSIS — Z888 Allergy status to other drugs, medicaments and biological substances status: Secondary | ICD-10-CM

## 2017-04-18 DIAGNOSIS — R0789 Other chest pain: Secondary | ICD-10-CM

## 2017-04-18 DIAGNOSIS — J9383 Other pneumothorax: Secondary | ICD-10-CM | POA: Diagnosis not present

## 2017-04-18 DIAGNOSIS — D649 Anemia, unspecified: Secondary | ICD-10-CM | POA: Diagnosis present

## 2017-04-18 DIAGNOSIS — J9811 Atelectasis: Secondary | ICD-10-CM | POA: Diagnosis not present

## 2017-04-18 DIAGNOSIS — Z6837 Body mass index (BMI) 37.0-37.9, adult: Secondary | ICD-10-CM

## 2017-04-18 DIAGNOSIS — J309 Allergic rhinitis, unspecified: Secondary | ICD-10-CM | POA: Diagnosis not present

## 2017-04-18 DIAGNOSIS — Z9689 Presence of other specified functional implants: Secondary | ICD-10-CM

## 2017-04-18 DIAGNOSIS — B370 Candidal stomatitis: Secondary | ICD-10-CM | POA: Diagnosis not present

## 2017-04-18 DIAGNOSIS — Z91018 Allergy to other foods: Secondary | ICD-10-CM | POA: Diagnosis not present

## 2017-04-18 DIAGNOSIS — Z79899 Other long term (current) drug therapy: Secondary | ICD-10-CM | POA: Diagnosis not present

## 2017-04-18 DIAGNOSIS — J449 Chronic obstructive pulmonary disease, unspecified: Secondary | ICD-10-CM | POA: Diagnosis present

## 2017-04-18 DIAGNOSIS — Z9841 Cataract extraction status, right eye: Secondary | ICD-10-CM | POA: Diagnosis not present

## 2017-04-18 DIAGNOSIS — R9431 Abnormal electrocardiogram [ECG] [EKG]: Secondary | ICD-10-CM | POA: Diagnosis not present

## 2017-04-18 DIAGNOSIS — Z886 Allergy status to analgesic agent status: Secondary | ICD-10-CM

## 2017-04-18 DIAGNOSIS — Z09 Encounter for follow-up examination after completed treatment for conditions other than malignant neoplasm: Secondary | ICD-10-CM

## 2017-04-18 DIAGNOSIS — J9611 Chronic respiratory failure with hypoxia: Secondary | ICD-10-CM

## 2017-04-18 DIAGNOSIS — J90 Pleural effusion, not elsewhere classified: Secondary | ICD-10-CM | POA: Diagnosis not present

## 2017-04-18 DIAGNOSIS — Z515 Encounter for palliative care: Secondary | ICD-10-CM

## 2017-04-18 DIAGNOSIS — R05 Cough: Secondary | ICD-10-CM | POA: Diagnosis not present

## 2017-04-18 DIAGNOSIS — Z880 Allergy status to penicillin: Secondary | ICD-10-CM | POA: Diagnosis not present

## 2017-04-18 DIAGNOSIS — R4182 Altered mental status, unspecified: Secondary | ICD-10-CM | POA: Diagnosis not present

## 2017-04-18 DIAGNOSIS — R509 Fever, unspecified: Secondary | ICD-10-CM | POA: Diagnosis not present

## 2017-04-18 DIAGNOSIS — J9312 Secondary spontaneous pneumothorax: Secondary | ICD-10-CM | POA: Diagnosis not present

## 2017-04-18 DIAGNOSIS — Z9842 Cataract extraction status, left eye: Secondary | ICD-10-CM

## 2017-04-18 DIAGNOSIS — Z7189 Other specified counseling: Secondary | ICD-10-CM

## 2017-04-18 DIAGNOSIS — M8580 Other specified disorders of bone density and structure, unspecified site: Secondary | ICD-10-CM | POA: Diagnosis not present

## 2017-04-18 DIAGNOSIS — Z7952 Long term (current) use of systemic steroids: Secondary | ICD-10-CM

## 2017-04-18 DIAGNOSIS — I5032 Chronic diastolic (congestive) heart failure: Secondary | ICD-10-CM | POA: Diagnosis not present

## 2017-04-18 DIAGNOSIS — N179 Acute kidney failure, unspecified: Secondary | ICD-10-CM | POA: Diagnosis present

## 2017-04-18 DIAGNOSIS — J9621 Acute and chronic respiratory failure with hypoxia: Secondary | ICD-10-CM | POA: Diagnosis not present

## 2017-04-18 DIAGNOSIS — Z66 Do not resuscitate: Secondary | ICD-10-CM | POA: Diagnosis present

## 2017-04-18 DIAGNOSIS — D860 Sarcoidosis of lung: Secondary | ICD-10-CM | POA: Diagnosis not present

## 2017-04-18 DIAGNOSIS — D869 Sarcoidosis, unspecified: Secondary | ICD-10-CM | POA: Diagnosis not present

## 2017-04-18 DIAGNOSIS — J9311 Primary spontaneous pneumothorax: Secondary | ICD-10-CM | POA: Diagnosis not present

## 2017-04-18 DIAGNOSIS — T3995XA Adverse effect of unspecified nonopioid analgesic, antipyretic and antirheumatic, initial encounter: Secondary | ICD-10-CM | POA: Diagnosis not present

## 2017-04-18 DIAGNOSIS — J45909 Unspecified asthma, uncomplicated: Secondary | ICD-10-CM | POA: Diagnosis not present

## 2017-04-18 DIAGNOSIS — R0602 Shortness of breath: Secondary | ICD-10-CM | POA: Diagnosis not present

## 2017-04-18 DIAGNOSIS — I130 Hypertensive heart and chronic kidney disease with heart failure and stage 1 through stage 4 chronic kidney disease, or unspecified chronic kidney disease: Secondary | ICD-10-CM | POA: Diagnosis present

## 2017-04-18 DIAGNOSIS — Z91011 Allergy to milk products: Secondary | ICD-10-CM

## 2017-04-18 DIAGNOSIS — E669 Obesity, unspecified: Secondary | ICD-10-CM | POA: Diagnosis present

## 2017-04-18 DIAGNOSIS — D72820 Lymphocytosis (symptomatic): Secondary | ICD-10-CM | POA: Diagnosis not present

## 2017-04-18 DIAGNOSIS — R079 Chest pain, unspecified: Secondary | ICD-10-CM | POA: Diagnosis not present

## 2017-04-18 DIAGNOSIS — N181 Chronic kidney disease, stage 1: Secondary | ICD-10-CM | POA: Diagnosis present

## 2017-04-18 DIAGNOSIS — R069 Unspecified abnormalities of breathing: Secondary | ICD-10-CM | POA: Diagnosis not present

## 2017-04-18 DIAGNOSIS — F331 Major depressive disorder, recurrent, moderate: Secondary | ICD-10-CM | POA: Diagnosis not present

## 2017-04-18 DIAGNOSIS — K589 Irritable bowel syndrome without diarrhea: Secondary | ICD-10-CM | POA: Diagnosis present

## 2017-04-18 DIAGNOSIS — K2270 Barrett's esophagus without dysplasia: Secondary | ICD-10-CM | POA: Diagnosis present

## 2017-04-18 DIAGNOSIS — Z9981 Dependence on supplemental oxygen: Secondary | ICD-10-CM | POA: Diagnosis not present

## 2017-04-18 DIAGNOSIS — J9610 Chronic respiratory failure, unspecified whether with hypoxia or hypercapnia: Secondary | ICD-10-CM | POA: Diagnosis not present

## 2017-04-18 DIAGNOSIS — R0989 Other specified symptoms and signs involving the circulatory and respiratory systems: Secondary | ICD-10-CM | POA: Diagnosis not present

## 2017-04-18 LAB — COMPREHENSIVE METABOLIC PANEL
ALBUMIN: 3.8 g/dL (ref 3.5–5.0)
ALK PHOS: 80 U/L (ref 38–126)
ALT: 27 U/L (ref 14–54)
ANION GAP: 11 (ref 5–15)
AST: 39 U/L (ref 15–41)
BUN: 13 mg/dL (ref 6–20)
CALCIUM: 9.2 mg/dL (ref 8.9–10.3)
CHLORIDE: 103 mmol/L (ref 101–111)
CO2: 23 mmol/L (ref 22–32)
CREATININE: 1.31 mg/dL — AB (ref 0.44–1.00)
GFR calc Af Amer: 47 mL/min — ABNORMAL LOW (ref >60)
GFR calc non Af Amer: 41 mL/min — ABNORMAL LOW (ref >60)
GLUCOSE: 219 mg/dL — AB (ref 65–99)
Potassium: 3.9 mmol/L (ref 3.5–5.1)
SODIUM: 137 mmol/L (ref 135–145)
Total Bilirubin: 0.4 mg/dL (ref 0.3–1.2)
Total Protein: 8.3 g/dL — ABNORMAL HIGH (ref 6.5–8.1)

## 2017-04-18 LAB — CBC WITH DIFFERENTIAL/PLATELET
BASOS PCT: 9 %
Basophils Absolute: 0.8 10*3/uL — ABNORMAL HIGH (ref 0.0–0.1)
EOS ABS: 0.5 10*3/uL (ref 0.0–0.7)
EOS PCT: 5 %
HEMATOCRIT: 39 % (ref 36.0–46.0)
HEMOGLOBIN: 12.7 g/dL (ref 12.0–15.0)
LYMPHS PCT: 58 %
Lymphs Abs: 5.5 10*3/uL — ABNORMAL HIGH (ref 0.7–4.0)
MCH: 30.2 pg (ref 26.0–34.0)
MCHC: 32.6 g/dL (ref 30.0–36.0)
MCV: 92.9 fL (ref 78.0–100.0)
Monocytes Absolute: 1.5 10*3/uL — ABNORMAL HIGH (ref 0.1–1.0)
Monocytes Relative: 16 %
NEUTROS ABS: 1.1 10*3/uL — AB (ref 1.7–7.7)
Neutrophils Relative %: 12 %
Platelets: 258 10*3/uL (ref 150–400)
RBC: 4.2 MIL/uL (ref 3.87–5.11)
RDW: 12.9 % (ref 11.5–15.5)
WBC: 9.4 10*3/uL (ref 4.0–10.5)

## 2017-04-18 LAB — I-STAT VENOUS BLOOD GAS, ED
BICARBONATE: 26.6 mmol/L (ref 20.0–28.0)
O2 SAT: 92 %
PH VEN: 7.338 (ref 7.250–7.430)
TCO2: 28 mmol/L (ref 0–100)
pCO2, Ven: 49.4 mmHg (ref 44.0–60.0)
pO2, Ven: 68 mmHg — ABNORMAL HIGH (ref 32.0–45.0)

## 2017-04-18 MED ORDER — ALBUTEROL (5 MG/ML) CONTINUOUS INHALATION SOLN
10.0000 mg/h | INHALATION_SOLUTION | RESPIRATORY_TRACT | Status: DC
  Administered 2017-04-18: 10 mg/h via RESPIRATORY_TRACT
  Filled 2017-04-18: qty 20

## 2017-04-18 MED ORDER — BUDESONIDE 0.5 MG/2ML IN SUSP
0.5000 mg | Freq: Two times a day (BID) | RESPIRATORY_TRACT | Status: DC
  Administered 2017-04-19 – 2017-05-12 (×44): 0.5 mg via RESPIRATORY_TRACT
  Filled 2017-04-18 (×47): qty 2

## 2017-04-18 MED ORDER — BUPROPION HCL ER (XL) 150 MG PO TB24: 300.0000 mg | ORAL_TABLET | Freq: Every day | ORAL | Status: DC

## 2017-04-18 MED ORDER — LORAZEPAM 0.5 MG PO TABS
0.2500 mg | ORAL_TABLET | Freq: Three times a day (TID) | ORAL | Status: DC | PRN
  Administered 2017-04-19 – 2017-04-22 (×2): 0.5 mg via ORAL
  Filled 2017-04-18 (×2): qty 1

## 2017-04-18 MED ORDER — AMITRIPTYLINE HCL 10 MG PO TABS: 10.0000 mg | ORAL_TABLET | Freq: Every evening | ORAL | Status: DC | PRN

## 2017-04-18 MED ORDER — ARFORMOTEROL TARTRATE 15 MCG/2ML IN NEBU
15.0000 ug | INHALATION_SOLUTION | Freq: Two times a day (BID) | RESPIRATORY_TRACT | Status: DC
  Administered 2017-04-19 – 2017-05-12 (×44): 15 ug via RESPIRATORY_TRACT
  Filled 2017-04-18 (×45): qty 2

## 2017-04-18 MED ORDER — SODIUM CHLORIDE 0.9 % IV SOLN
INTRAVENOUS | Status: DC
  Administered 2017-04-19 (×2): via INTRAVENOUS

## 2017-04-18 MED ORDER — OXYCODONE-ACETAMINOPHEN 5-325 MG PO TABS
1.0000 | ORAL_TABLET | ORAL | Status: DC | PRN
  Administered 2017-04-19 – 2017-04-29 (×16): 1 via ORAL
  Filled 2017-04-18 (×18): qty 1

## 2017-04-18 MED ORDER — LORATADINE 10 MG PO TABS
10.0000 mg | ORAL_TABLET | Freq: Every day | ORAL | Status: DC
  Administered 2017-04-19 – 2017-05-12 (×24): 10 mg via ORAL
  Filled 2017-04-18 (×24): qty 1

## 2017-04-18 MED ORDER — CYCLOSPORINE 0.05 % OP EMUL
1.0000 [drp] | Freq: Two times a day (BID) | OPHTHALMIC | Status: DC
  Administered 2017-04-18 – 2017-05-12 (×47): 1 [drp] via OPHTHALMIC
  Filled 2017-04-18 (×49): qty 1

## 2017-04-18 MED ORDER — FLUTICASONE FUROATE-VILANTEROL 200-25 MCG/INH IN AEPB: 1.0000 | INHALATION_SPRAY | Freq: Every day | RESPIRATORY_TRACT | Status: DC

## 2017-04-18 MED ORDER — LIDOCAINE 5 % EX PTCH
1.0000 | MEDICATED_PATCH | CUTANEOUS | Status: DC
  Administered 2017-04-18 – 2017-05-11 (×21): 1 via TRANSDERMAL
  Filled 2017-04-18 (×22): qty 1

## 2017-04-18 MED ORDER — PANTOPRAZOLE SODIUM 40 MG PO TBEC: 40.0000 mg | DELAYED_RELEASE_TABLET | Freq: Every day | ORAL | Status: DC

## 2017-04-18 MED ORDER — IPRATROPIUM-ALBUTEROL 0.5-2.5 (3) MG/3ML IN SOLN: 3.0000 mL | Freq: Four times a day (QID) | RESPIRATORY_TRACT | Status: DC

## 2017-04-18 MED ORDER — LIDOCAINE HCL (PF) 1 % IJ SOLN
INTRAMUSCULAR | Status: AC
  Filled 2017-04-18: qty 30

## 2017-04-18 MED ORDER — FAMOTIDINE 20 MG PO TABS
20.0000 mg | ORAL_TABLET | Freq: Two times a day (BID) | ORAL | Status: DC
  Administered 2017-04-18 – 2017-05-12 (×48): 20 mg via ORAL
  Filled 2017-04-18 (×48): qty 1

## 2017-04-18 MED ORDER — METHYLPREDNISOLONE SODIUM SUCC 125 MG IJ SOLR
125.0000 mg | Freq: Once | INTRAMUSCULAR | Status: AC
  Administered 2017-04-18: 125 mg via INTRAVENOUS
  Filled 2017-04-18: qty 2

## 2017-04-18 MED ORDER — ONDANSETRON HCL 4 MG/2ML IJ SOLN
4.0000 mg | Freq: Four times a day (QID) | INTRAMUSCULAR | Status: DC | PRN
Start: 2017-04-18 — End: 2017-05-12

## 2017-04-18 MED ORDER — IPRATROPIUM BROMIDE HFA 17 MCG/ACT IN AERS: 2.0000 | INHALATION_SPRAY | Freq: Four times a day (QID) | RESPIRATORY_TRACT | Status: DC | PRN

## 2017-04-18 MED ORDER — ENOXAPARIN SODIUM 40 MG/0.4ML ~~LOC~~ SOLN
40.0000 mg | SUBCUTANEOUS | Status: DC
  Administered 2017-04-18 – 2017-05-11 (×24): 40 mg via SUBCUTANEOUS
  Filled 2017-04-18 (×24): qty 0.4

## 2017-04-18 MED ORDER — ALBUTEROL SULFATE (2.5 MG/3ML) 0.083% IN NEBU: 2.5000 mg | INHALATION_SOLUTION | RESPIRATORY_TRACT | Status: DC | PRN

## 2017-04-18 MED ORDER — MORPHINE SULFATE (PF) 4 MG/ML IV SOLN
4.0000 mg | Freq: Once | INTRAVENOUS | Status: AC
  Administered 2017-04-18: 4 mg via INTRAVENOUS
  Filled 2017-04-18: qty 1

## 2017-04-18 MED ORDER — FLUTICASONE PROPIONATE 50 MCG/ACT NA SUSP
1.0000 | Freq: Three times a day (TID) | NASAL | Status: DC
  Administered 2017-04-18 – 2017-05-12 (×69): 1 via NASAL
  Filled 2017-04-18 (×2): qty 16

## 2017-04-18 MED ORDER — ACETAMINOPHEN 325 MG PO TABS
650.0000 mg | ORAL_TABLET | Freq: Four times a day (QID) | ORAL | Status: DC | PRN
  Administered 2017-04-25 – 2017-04-27 (×3): 650 mg via ORAL
  Filled 2017-04-18 (×4): qty 2

## 2017-04-18 MED ORDER — ONDANSETRON HCL 4 MG PO TABS: 4.0000 mg | ORAL_TABLET | Freq: Four times a day (QID) | ORAL | Status: DC | PRN

## 2017-04-18 MED ORDER — MORPHINE SULFATE (PF) 4 MG/ML IV SOLN
2.0000 mg | INTRAVENOUS | Status: DC | PRN
  Administered 2017-04-19: 2 mg via INTRAVENOUS
  Administered 2017-04-19 – 2017-04-23 (×3): 4 mg via INTRAVENOUS
  Administered 2017-04-24: 3 mg via INTRAVENOUS
  Administered 2017-04-24: 2 mg via INTRAVENOUS
  Administered 2017-04-24: 4 mg via INTRAVENOUS
  Administered 2017-04-25 – 2017-04-29 (×8): 2 mg via INTRAVENOUS
  Administered 2017-04-30 (×3): 4 mg via INTRAVENOUS
  Administered 2017-05-02 – 2017-05-03 (×2): 2 mg via INTRAVENOUS
  Administered 2017-05-04: 4 mg via INTRAVENOUS
  Filled 2017-04-18 (×21): qty 1

## 2017-04-18 MED ORDER — FENTANYL CITRATE (PF) 100 MCG/2ML IJ SOLN
50.0000 ug | Freq: Once | INTRAMUSCULAR | Status: AC
  Administered 2017-04-18: 50 ug via INTRAVENOUS
  Filled 2017-04-18: qty 2

## 2017-04-18 MED ORDER — FLUTICASONE FUROATE-VILANTEROL 200-25 MCG/INH IN AEPB
1.0000 | INHALATION_SPRAY | Freq: Every day | RESPIRATORY_TRACT | Status: DC
  Filled 2017-04-18: qty 28

## 2017-04-18 MED ORDER — ACETAMINOPHEN 650 MG RE SUPP: 650.0000 mg | Freq: Four times a day (QID) | RECTAL | Status: DC | PRN

## 2017-04-18 MED ORDER — PANTOPRAZOLE SODIUM 20 MG PO TBEC
20.0000 mg | DELAYED_RELEASE_TABLET | Freq: Every day | ORAL | Status: DC
  Administered 2017-04-19 – 2017-04-22 (×4): 20 mg via ORAL
  Filled 2017-04-18 (×4): qty 1

## 2017-04-18 MED ORDER — ADULT MULTIVITAMIN W/MINERALS CH
1.0000 | ORAL_TABLET | Freq: Every day | ORAL | Status: DC
  Administered 2017-04-19 – 2017-05-12 (×24): 1 via ORAL
  Filled 2017-04-18 (×24): qty 1

## 2017-04-18 MED ORDER — MULTIVITAMINS PO CAPS: 1.0000 | ORAL_CAPSULE | Freq: Every morning | ORAL | Status: DC

## 2017-04-18 MED ORDER — PANTOPRAZOLE SODIUM 20 MG PO TBEC: 20.0000 mg | DELAYED_RELEASE_TABLET | Freq: Every day | ORAL | Status: DC

## 2017-04-18 MED ORDER — BUPROPION HCL ER (XL) 150 MG PO TB24
300.0000 mg | ORAL_TABLET | Freq: Every day | ORAL | Status: DC
  Administered 2017-04-19 – 2017-05-12 (×24): 300 mg via ORAL
  Filled 2017-04-18 (×24): qty 2

## 2017-04-18 NOTE — ED Provider Notes (Signed)
[facility]-EMERGENCY DEPT Provider Note   CSN: [id number] Arrival date & time: 04/18/17  1729     History   Chief Complaint Chief Complaint  Patient presents with  . Shortness of Breath    HPI [name] is a 66 y.o. female.  Level V caveat: Respiratory distress.   [name] is a 66 y.o. Female with a history of COPD, asthma and pulmonary sarcoidosis who presents to the emergency department via EMS complaining of sudden onset shortness of breath. Patient put she had sudden onset of pain in the right side of her chest as well as shortness of breath. EMS placed her on CPAP and provided her with 10 mg of albuterol and 0.5 mg of Atrovent and round. They report her oxygen saturation was 70% on room air on arrival on a breathing treatment by fire department on EMS arrival. They report the highest oxygen saturations they've seen in route were in the 80s. Patient speaking in one to two word sentences. She denies extremity swelling, fevers, or hemoptysis.       Past Medical History:  Diagnosis Date  . Anxiety   . Asthma   . Barrett esophagus   . GERD (gastroesophageal reflux disease)   . Hiatal hernia   . IBS (irritable bowel syndrome)   . Migraines   . Pulmonary sarcoidosis [facility])     Patient Active Problem List   Diagnosis Date Noted  . Pneumothorax on right 04/18/2017  . Spontaneous pneumothorax 04/18/2017  . SOB (shortness of breath) 06/05/2016  . Osteopenia 02/12/2016  . Lumbar radiculopathy 01/26/2015  . Chronic respiratory failure with hypoxia (HCC) 04/12/2014  . Obesity (BMI 30-39.9) 02/08/2014  . Allergic rhinitis 09/05/2010  . IBS 09/05/2010  . GERD 02/18/2010  . Barrett's esophagus 02/12/2010  . Upper airway cough syndrome 11/02/2009  . Anxiety state 08/29/2008  . History of colonic polyps 08/29/2008  . PULMONARY SARCOIDOSIS 08/12/2007  . Mild persistent asthma in adult without complication with component of vcd 08/12/2007  . Diaphragmatic hernia  08/12/2007  . Migraine 08/12/2007    Past Surgical History:  Procedure Laterality Date  . BRAVO PH STUDY N/A 10/10/2013   Procedure: BRAVO PH STUDY;  Surgeon: Hart Carwin, MD;  Location: [facility] ENDOSCOPY;  Service: Endoscopy;  Laterality: N/A;  placed 27  . BRONCHIAL BIOPSY  1996  . CATARACT EXTRACTION, BILATERAL    . CHOLECYSTECTOMY    . ESOPHAGOGASTRODUODENOSCOPY N/A 10/10/2013   Procedure: ESOPHAGOGASTRODUODENOSCOPY (EGD);  Surgeon: Hart Carwin, MD;  Location: [facility];  Service: Endoscopy;  Laterality: N/A;  pt asthmatic, RA 92% at rest.  Coughing frequently. Wheezes noted in upper lung fields at auscultation. Pt instructed to use home inhaler prior to procedure and placed on supportive O2 @ 2 lpm in pre-procedural. Teaching done ie. use of inhaler. Pt states she ju  . NISSEN FUNDOPLICATION    . ROTATOR CUFF REPAIR     x3(2 on L, 1 on R)    OB History    No data available       Home Medications    Prior to Admission medications   Medication Sig Start Date End Date Taking? Authorizing Provider  albuterol (VENTOLIN HFA) 108 (90 Base) MCG/ACT inhaler Inhale 2 puffs into the lungs as needed. Patient taking differently: Inhale 2 puffs into the lungs every 4 (four) hours as needed for wheezing or shortness of breath.  12/11/16  Yes Copland, Gwenlyn Found, MD  ALOE VERA JUICE PO Take 120 mLs by mouth  daily.    Yes [provider]  Ascorbic Acid (VITAMIN C PO) Take 1 tablet by mouth daily.   Yes [provider]  Ascorbic Acid (VITAMIN C) 1000 MG tablet Take 1,000 mg by mouth daily.    Yes [provider]  ASTRAGALUS PO Take 1 tablet by mouth daily.    Yes [provider]  buPROPion (WELLBUTRIN XL) 300 MG 24 hr tablet Take 1 tablet (300 mg total) by mouth daily. Patient taking differently: Take 300 mg by mouth daily before breakfast.  12/12/16  Yes Copland, Gwenlyn FoundJessica C, MD  Calcium Carbonate-Vitamin D (CALCIUM-D PO) Take 1 tablet by mouth daily.   Yes  [provider]  Collagen Hydrolysate POWD Take 1 scoop by mouth daily with supper.    Yes [provider]  Cyanocobalamin (VITAMIN B-12) 5000 MCG TBDP Take 5,000 mcg by mouth daily.   Yes [provider]  cycloSPORINE (RESTASIS) 0.05 % ophthalmic emulsion Place 1 drop into both eyes 2 (two) times daily.    Yes [provider]  fluticasone (FLONASE) 50 MCG/ACT nasal spray Place 1 spray into both nostrils 3 (three) times daily.   Yes [provider]  furosemide (LASIX) 20 MG tablet TAKE 1 TABLET (20 MG TOTAL) BY MOUTH DAILY. USE AS NEEDED FOR SWELLING Patient taking differently: Take 20 mg by mouth daily as needed (swelling).  09/18/16  Yes Copland, Gwenlyn FoundJessica C, MD  ipratropium (ATROVENT HFA) 17 MCG/ACT inhaler Inhale 2 puffs into the lungs every 6 (six) hours as needed for wheezing. 06/27/16  Yes Saguier, Ramon DredgeEdward, PA-C  loratadine (CLARITIN) 10 MG tablet Take 10 mg by mouth daily.   Yes [provider]  LORazepam (ATIVAN) 0.5 MG tablet TAKE 1/2 TO 1 TABLET BY MOUTH EVERY 8 TO 12 HRS ONLY AS NEEDED Patient taking differently: TAKE 1/2 TO 1 TABLET (0.25 - 0.5 MG) BY MOUTH EVERY 8 TO 12 HRS ONLY AS NEEDED FOR PANIC ATTACKS 09/18/16  Yes Copland, Gwenlyn FoundJessica C, MD  Multiple Vitamin (MULTIVITAMIN WITH MINERALS) TABS tablet Take 1 tablet by mouth daily.   Yes [provider]  Nasal Moisturizer Combination (RHINASE) SOLN Place 2 sprays into the nose 2 (two) times daily as needed (dry, bloody nostrils).    Yes [provider]  OXYGEN Inhale 2 L into the lungs continuous.   Yes [provider]  pantoprazole (PROTONIX) 20 MG tablet TAKE 1 TABLET (20 MG TOTAL) BY MOUTH DAILY. 03/16/17  Yes Armbruster, Reeves ForthSteven Paul, MD  PREBIOTIC PRODUCT PO Take 1 scoop by mouth daily before breakfast.    Yes [provider]  ranitidine (ZANTAC) 150 MG tablet TAKE 1 TABLET (150 MG TOTAL) BY MOUTH 2 (TWO) TIMES DAILY. 10/03/16  Yes Armbruster, Reeves ForthSteven  Paul, MD  SYMBICORT 160-4.5 MCG/ACT inhaler INHALE 2 PUFF INTO THE LUNGS TWICE A DAY AS NEEDED Patient taking differently: INHALE 2 PUFFS INTO THE LUNGS EVERY MORNING, MAY ALSO USE IN THE EVENING AS NEEDED FOR SHORTNESS OF BREATH 03/30/17  Yes Oretha MilchAlva, Rakesh V, MD  amitriptyline (ELAVIL) 10 MG tablet Take 1 tablet (10 mg total) by mouth at bedtime. Patient not taking: Reported on 04/18/2017 02/07/16   Copland, Gwenlyn FoundJessica C, MD  pantoprazole (PROTONIX) 40 MG tablet TAKE 1 TABLET (40 MG TOTAL) BY MOUTH DAILY. TAKE 30-60 MIN BEFORE FIRST MEAL OF THE DAY Patient not taking: Reported on 04/18/2017 08/27/15   Nyoka CowdenWert, Michael B, MD    Family History Family History  Problem Relation Age of Onset  . Adopted:  Yes    Social History Social History  Substance Use Topics  . Smoking status: Former Smoker    Packs/day: 1.00    Years: 20.00    Types: Cigarettes    Quit date: 09/08/1978  . Smokeless tobacco: Never Used  . Alcohol use No     Allergies   Gabapentin; Lactose intolerance (gi); Penicillins; Pineapple; and Aspirin   Review of Systems Review of Systems  Unable to perform ROS: Severe respiratory distress     Physical Exam Updated Vital Signs BP 116/83 (BP Location: Left Arm)   Pulse (!) 102   Temp 98.9 F (37.2 C) (Axillary)   Resp (!) 24   Wt 100.4 kg (221 lb 6.4 oz)   SpO2 94%   BMI 36.84 kg/m   Physical Exam  Constitutional: She appears well-developed and well-nourished. She appears distressed.  Acute respiratory distress. Obese female.  HENT:  Head: Normocephalic and atraumatic.  Mouth/Throat: Oropharynx is clear and moist.  Eyes: Pupils are equal, round, and reactive to light. Conjunctivae are normal. Right eye exhibits no discharge. Left eye exhibits no discharge.  Neck: Neck supple. No tracheal deviation present.  No tracheal deviation.  Cardiovascular: Regular rhythm, normal heart sounds and intact distal pulses.  Exam reveals no gallop and no friction rub.   No murmur  heard. HR 130.   Pulmonary/Chest: She is in respiratory distress. She has wheezes. She has no rales.  Respiratory distress on Bipap. Increased work of breathing. Symmetric chest expansion bilaterally. Diminished lung sounds and wheezing to left and almost absent lung sound to right chest. No stridor.   Abdominal: Soft. There is no tenderness.  Musculoskeletal: She exhibits no edema or tenderness.  No LE edema   Lymphadenopathy:    She has no cervical adenopathy.  Neurological: She is alert. Coordination normal.  Skin: Skin is warm and dry. Capillary refill takes less than 2 seconds. No rash noted. She is not diaphoretic. No erythema. No pallor.  Psychiatric: She has a normal mood and affect. Her behavior is normal.  Nursing note and vitals reviewed.    ED Treatments / Results  Labs (all labs ordered are listed, but only abnormal results are displayed) Labs Reviewed  COMPREHENSIVE METABOLIC PANEL - Abnormal; Notable for the following:       Result Value   Glucose, Bld 219 (*)    Creatinine, Ser 1.31 (*)    Total Protein 8.3 (*)    GFR calc non Af Amer 41 (*)    GFR calc Af Amer 47 (*)    All other components within normal limits  CBC WITH DIFFERENTIAL/PLATELET - Abnormal; Notable for the following:    Neutro Abs 1.1 (*)    Lymphs Abs 5.5 (*)    Monocytes Absolute 1.5 (*)    Basophils Absolute 0.8 (*)    All other components within normal limits  I-STAT VENOUS BLOOD GAS, ED - Abnormal; Notable for the following:    pO2, Ven 68.0 (*)    All other components within normal limits  CBC  BASIC METABOLIC PANEL  CBG MONITORING, ED  I-STAT TROPONIN, ED    EKG  EKG Interpretation None       Radiology Dg Chest Port 1 View  Result Date: 04/19/2017 CLINICAL DATA:  Right-sided chest tube repositioning. Initial encounter. EXAM: PORTABLE CHEST 1 VIEW COMPARISON:  Chest radiograph performed earlier today at 10:22 p.m. FINDINGS: There is a persistent small right apical pneumothorax,  stable from the prior study. The right-sided chest tube  has been advanced. Patchy bibasilar airspace opacities raise concern for pulmonary edema. Mild vascular congestion is noted. No definite pleural effusion is seen. The cardiomediastinal silhouette is borderline normal in size. No acute osseous abnormalities are identified. Soft tissue air is noted along the right chest wall. IMPRESSION: 1. Persistent small right apical pneumothorax is stable from the recent prior study. Right-sided chest tube has been advanced. 2. Patchy bilateral airspace opacities raise concern for pulmonary edema. Mild vascular congestion noted. Electronically Signed   By: Roanna Raider M.D.   On: 04/19/2017 00:24   Dg Chest Port 1 View  Result Date: 04/18/2017 CLINICAL DATA:  66 y/o  F; pneumothorax.  History of sarcoidosis. EXAM: PORTABLE CHEST 1 VIEW COMPARISON:  04/18/2017 chest radiograph FINDINGS: Stable right-sided pneumothorax. No leftward mediastinal shift. Stable position of right-sided pigtail catheter partially in right chest wall and partially pleural space. Interval increase in pneumatosis within the right chest wall. Stable diffuse coarse reticular opacities of the lungs. Stable cardiac silhouette. IMPRESSION: 1. Stable right pneumothorax. Stable position of right pigtail catheter partially in the right chest wall and partially in pleural space. Interval increase in right chest wall pneumatosis. 2. Stable coarse reticular opacities compatible with chronic interstitial lung disease. Electronically Signed   By: Mitzi Hansen M.D.   On: 04/18/2017 22:41   Dg Chest Port 1 View  Result Date: 04/18/2017 CLINICAL DATA:  Status post chest tube placement. History of sarcoidosis. EXAM: PORTABLE CHEST 1 VIEW COMPARISON:  Earlier today. FINDINGS: Interval right lateral pigtail chest catheter with some side holes projected within the lateral right hemithorax and some projected outside of the hemithorax. Significant  decrease in size of the previously demonstrated large right pneumothorax. The residual pneumothorax occupies approximately 10% of the volume of the right hemithorax. No significant change in diffuse prominence of the interstitial markings. Normal sized heart. Unremarkable bones. IMPRESSION: 1. Interval right lateral pigtail chest catheter with some side holes in the hemithorax and some side holes outside of the hemithorax. 2. Significant decrease in size of the right pneumothorax with a residual approximately 10% pneumothorax. 3. Stable chronic interstitial lung disease compatible with the history of sarcoidosis. Electronically Signed   By: Beckie Salts M.D.   On: 04/18/2017 19:01   Dg Chest Port 1 View  Result Date: 04/18/2017 CLINICAL DATA:  Acute shortness of breath and diminished breath sounds on RIGHT, history sarcoidosis, GERD, asthma EXAM: PORTABLE CHEST 1 VIEW COMPARISON:  Portable exam 1735 hours compared to 06/27/2016 FINDINGS: Normal heart size and mediastinal contours. Large RIGHT pneumothorax with complete collapse of the RIGHT lung. Underlying chronic interstitial lung disease/pulmonary fibrosis. No mediastinal shift. Bones demineralized. IMPRESSION: Large RIGHT pneumothorax with complete collapse of the RIGHT lung without evidence of mediastinal shift/tension. Underlying chronic interstitial lung disease/ pulmonary fibrosis. Critical Value/emergent results were called by telephone at the time of interpretation on 04/18/2017 at 5:52 pm to [facility] PA, who verbally acknowledged these results. Electronically Signed   By: Ulyses Southward M.D.   On: 04/18/2017 17:53    Procedures CHEST TUBE INSERTION Date/Time: 04/18/2017 6:02 PM Performed by: Everlene Farrier Authorized by: Everlene Farrier   Consent:    Consent obtained:  Verbal, written and emergent situation   Consent given by:  Patient   Risks discussed:  Bleeding, damage to surrounding structures, infection, incomplete drainage and  pain Universal protocol:    Procedure explained and questions answered to patient or proxy's satisfaction: yes     Relevant documents present and verified: yes  Test results available and properly labeled: yes     Imaging studies available: yes     Required blood products, implants, devices, and special equipment available: yes     Site/side marked: yes     Immediately prior to procedure a time out was called: yes     Patient identity confirmed:  Verbally with patient and arm band Pre-procedure details:    Skin preparation:  ChloraPrep   Preparation: Patient was prepped and draped in the usual sterile fashion   Anesthesia (see MAR for exact dosages):    Anesthesia method:  Local infiltration   Local anesthetic:  Lidocaine 1% w/o epi Procedure details:    Placement location:  R lateral   Scalpel size:  11   Tube size (French): 14.   Dissection instrument: 18 ga needle    Ultrasound guidance: no     Tension pneumothorax: no     Tube connected to:  Suction, water seal and Heimlich valve   Drainage characteristics:  Air only   Suture material:  0 silk   Dressing:  4x4 sterile gauze and Xeroform gauze Post-procedure details:    Post-insertion x-ray findings: tube in good position     Patient tolerance of procedure:  Tolerated well, no immediate complications   (including critical care time)  CRITICAL CARE Performed by: Lawana Chambers   Total critical care time: 60 minutes  Critical care time was exclusive of separately billable procedures and treating other patients.  Critical care was necessary to treat or prevent imminent or life-threatening deterioration.  Critical care was time spent personally by me on the following activities: development of treatment plan with patient and/or surrogate as well as nursing, discussions with consultants, evaluation of patient's response to treatment, examination of patient, obtaining history from patient or surrogate, ordering and  performing treatments and interventions, ordering and review of laboratory studies, ordering and review of radiographic studies, pulse oximetry and re-evaluation of patient's condition.   Medications Ordered in ED Medications  famotidine (PEPCID) tablet 20 mg (20 mg Oral Given 04/18/17 2231)  albuterol (PROVENTIL) (2.5 MG/3ML) 0.083% nebulizer solution 2.5 mg (not administered)  cycloSPORINE (RESTASIS) 0.05 % ophthalmic emulsion 1 drop (1 drop Both Eyes Given 04/18/17 2304)  fluticasone (FLONASE) 50 MCG/ACT nasal spray 1 spray (1 spray Each Nare Given 04/18/17 2304)  LORazepam (ATIVAN) tablet 0.25-0.5 mg (0.5 mg Oral Given 04/19/17 0009)  morphine 4 MG/ML injection 2-4 mg (not administered)  loratadine (CLARITIN) tablet 10 mg (not administered)  buPROPion (WELLBUTRIN XL) 24 hr tablet 300 mg (not administered)  acetaminophen (TYLENOL) tablet 650 mg (not administered)    Or  acetaminophen (TYLENOL) suppository 650 mg (not administered)  ondansetron (ZOFRAN) tablet 4 mg (not administered)    Or  ondansetron (ZOFRAN) injection 4 mg (not administered)  enoxaparin (LOVENOX) injection 40 mg (40 mg Subcutaneous Given 04/18/17 2231)  multivitamin with minerals tablet 1 tablet (not administered)  lidocaine (LIDODERM) 5 % 1 patch (1 patch Transdermal Patch Applied 04/18/17 2228)  oxyCODONE-acetaminophen (PERCOCET/ROXICET) 5-325 MG per tablet 1 tablet (1 tablet Oral Given 04/19/17 0009)  budesonide (PULMICORT) nebulizer solution 0.5 mg (0.5 mg Nebulization Given 04/19/17 0012)  arformoterol (BROVANA) nebulizer solution 15 mcg (15 mcg Nebulization Given 04/19/17 0012)  pantoprazole (PROTONIX) EC tablet 20 mg (not administered)  0.9 %  sodium chloride infusion ( Intravenous New Bag/Given 04/19/17 0003)  ipratropium-albuterol (DUONEB) 0.5-2.5 (3) MG/3ML nebulizer solution 3 mL (not administered)  methylPREDNISolone sodium succinate (SOLU-MEDROL) 125 mg/2 mL injection 125 mg (125 mg  Intravenous Given 04/18/17 1744)    fentaNYL (SUBLIMAZE) injection 50 mcg (50 mcg Intravenous Given 04/18/17 1913)  morphine 4 MG/ML injection 4 mg (4 mg Intravenous Given 04/18/17 2229)     Initial Impression / Assessment and Plan / ED Course  I have reviewed the triage vital signs and the nursing notes.  Pertinent labs & imaging results that were available during my care of the patient were reviewed by me and considered in my medical decision making (see chart for details).    This is a 66 y.o. Female with a history of COPD, asthma and pulmonary sarcoidosis who presents to the emergency department via EMS complaining of sudden onset shortness of breath. Patient put she had sudden onset of pain in the right side of her chest as well as shortness of breath. EMS placed her on CPAP and provided her with 10 mg of albuterol and 0.5 mg of Atrovent and round. They report her oxygen saturation was 70% on room air on arrival on a breathing treatment by fire department on EMS arrival. They report the highest oxygen saturations they've seen in route were in the 80s. Patient speaking in one to two word sentences. On arrival to the emergency department the patient is in respiratory distress. She is on CPAP and was transitioned to BiPAP on arrival. She is speaking in 1-2 word sentences. She has wheezing and diminished lung sounds bilaterally with nearly absent lung sounds on the right. Oxygen saturation improved to the 90s on our BiPAP. Portable chest x-rays obtained which showed right-sided pneumothorax. On the x-ray and clinically patient has no evidence of a tension pneumothorax at this time. Plan a pigtail chest tube. Patient agrees with plan and consents. Pigtail chest tube was inserted by myself and tolerated well by the patient. Repeat x-ray shows remaining 10% pneumothorax. Much improved. Patient clinically improved. Her oxygen saturation improved and she is feeling much better. She is able to come off BiPAP and transitions to her nasal  cannula which she wears chronically. Oxygen saturations 100% on 2 L via nasal cannula. Pulmonary and critical care medicine advises they will see the patient in consult. Plan for admission by medicine. Patient agrees with plan for admission.  I consulted with Dr. Julian Reil who accepted the patient for admission.   This patient was discussed with and evaluated by Dr. Ranae Palms who agrees with assessment and plan.   Final Clinical Impressions(s) / ED Diagnoses   Final diagnoses:  Spontaneous pneumothorax    New Prescriptions Current Discharge Medication List       Lorene Dy 04/19/17 0111    Loren Racer, MD 04/19/17 1941

## 2017-04-18 NOTE — ED Notes (Signed)
Patient is stable and ready to be transport to the floor at this time.  Report was called to 3W RN.  Belongings taken with the patient to the floor.   

## 2017-04-18 NOTE — Consult Note (Signed)
PULMONARY / CRITICAL CARE MEDICINE   Name: Margaretha SeedsBarbara V Hanover MRN: [id number] DOB: 03/23/1951    ADMISSION DATE:  04/18/2017 CONSULTATION DATE: 04/18/17  REFERRING MD: Dr Julian ReilGardner  CHIEF COMPLAINT: Pneumothorax  HISTORY OF PRESENT ILLNESS:   66yoF with Pulmonary sarcoidosis, Asthma, Chronic Hypoxia (on 2L O2), GERD, Anxiety, Migraines, Grade 1 Diastolic CHF, Allergic rhinitis, and Obesity who presents to the ER today with acute onset severe SOB and worsening hypoxia. She reports she turned her O2 from 2L to 4L and was still hypoxic. She says she thought she was having a heart attack as she also had chest pain in her right breast. She reports chronic forceful cough and said this SOB started after a coughing episode. Denies sputum. Reportedly was wheezing on arrival to the ER but denies any now. Denies falls or trauma.   PAST MEDICAL HISTORY :  She  has a past medical history of Anxiety; Asthma; Barrett esophagus; GERD (gastroesophageal reflux disease); Hiatal hernia; IBS (irritable bowel syndrome); Migraines; and Pulmonary sarcoidosis (HCC).  PAST SURGICAL HISTORY: She  has a past surgical history that includes Cholecystectomy; Rotator cuff repair; Cataract extraction, bilateral; Bronchial biopsy (1996); Nissen fundoplication; BRAVO ph study (N/A, 10/10/2013); and Esophagogastroduodenoscopy (N/A, 10/10/2013).  Allergies  Allergen Reactions  . Gabapentin Other (See Comments)    Edema on 2 separate trials  . Lactose Intolerance (Gi) Diarrhea and Other (See Comments)    Stomach cramps  . Penicillins Other (See Comments)    Unknown - childhood allergy Has patient had a PCN reaction causing immediate rash, facial/tongue/throat swelling, SOB or lightheadedness with hypotension: Unknown Has patient had a PCN reaction causing severe rash involving mucus membranes or skin necrosis: Unknown Has patient had a PCN reaction that required hospitalization: Unknown Has patient had a PCN reaction occurring  within the last 10 years: No If all of the above answers are "NO", then may proceed with Cephalosporin use.  Marland Kitchen. Pineapple Hives and Itching  . Aspirin Other (See Comments)    gastritis    No current facility-administered medications on file prior to encounter.    Current Outpatient Prescriptions on File Prior to Encounter  Medication Sig  . albuterol (VENTOLIN HFA) 108 (90 Base) MCG/ACT inhaler Inhale 2 puffs into the lungs as needed. (Patient taking differently: Inhale 2 puffs into the lungs every 4 (four) hours as needed for wheezing or shortness of breath. )  . ALOE VERA JUICE PO Take 120 mLs by mouth daily.   . Ascorbic Acid (VITAMIN C) 1000 MG tablet Take 1,000 mg by mouth daily.   . ASTRAGALUS PO Take 1 tablet by mouth daily.   Marland Kitchen. buPROPion (WELLBUTRIN XL) 300 MG 24 hr tablet Take 1 tablet (300 mg total) by mouth daily. (Patient taking differently: Take 300 mg by mouth daily before breakfast. )  . Collagen Hydrolysate POWD Take 1 scoop by mouth daily with supper.   . Cyanocobalamin (VITAMIN B-12) 5000 MCG TBDP Take 5,000 mcg by mouth daily.  . cycloSPORINE (RESTASIS) 0.05 % ophthalmic emulsion Place 1 drop into both eyes 2 (two) times daily.   . fluticasone (FLONASE) 50 MCG/ACT nasal spray Place 1 spray into both nostrils 3 (three) times daily.  . furosemide (LASIX) 20 MG tablet TAKE 1 TABLET (20 MG TOTAL) BY MOUTH DAILY. USE AS NEEDED FOR SWELLING (Patient taking differently: Take 20 mg by mouth daily as needed (swelling). )  . ipratropium (ATROVENT HFA) 17 MCG/ACT inhaler Inhale 2 puffs into the lungs every 6 (six) hours as needed  for wheezing.  Marland Kitchen loratadine (CLARITIN) 10 MG tablet Take 10 mg by mouth daily.  Marland Kitchen LORazepam (ATIVAN) 0.5 MG tablet TAKE 1/2 TO 1 TABLET BY MOUTH EVERY 8 TO 12 HRS ONLY AS NEEDED (Patient taking differently: TAKE 1/2 TO 1 TABLET (0.25 - 0.5 MG) BY MOUTH EVERY 8 TO 12 HRS ONLY AS NEEDED FOR PANIC ATTACKS)  . Nasal Moisturizer Combination (RHINASE) SOLN Place 2  sprays into the nose 2 (two) times daily as needed (dry, bloody nostrils).   . pantoprazole (PROTONIX) 20 MG tablet TAKE 1 TABLET (20 MG TOTAL) BY MOUTH DAILY.  Marland Kitchen PREBIOTIC PRODUCT PO Take 1 scoop by mouth daily before breakfast.   . ranitidine (ZANTAC) 150 MG tablet TAKE 1 TABLET (150 MG TOTAL) BY MOUTH 2 (TWO) TIMES DAILY.  . SYMBICORT 160-4.5 MCG/ACT inhaler INHALE 2 PUFF INTO THE LUNGS TWICE A DAY AS NEEDED (Patient taking differently: INHALE 2 PUFFS INTO THE LUNGS EVERY MORNING, MAY ALSO USE IN THE EVENING AS NEEDED FOR SHORTNESS OF BREATH)  . amitriptyline (ELAVIL) 10 MG tablet Take 1 tablet (10 mg total) by mouth at bedtime. (Patient not taking: Reported on 04/18/2017)  . pantoprazole (PROTONIX) 40 MG tablet TAKE 1 TABLET (40 MG TOTAL) BY MOUTH DAILY. TAKE 30-60 MIN BEFORE FIRST MEAL OF THE DAY (Patient not taking: Reported on 04/18/2017)   FAMILY HISTORY:  Her is adopted.   SOCIAL HISTORY: She  reports that she quit smoking about 38 years ago. Her smoking use included Cigarettes. She has a 20.00 pack-year smoking history. She has never used smokeless tobacco. She reports that she does not drink alcohol or use drugs.  REVIEW OF SYSTEMS:   Review of Systems  Constitutional: Negative.   HENT: Negative.   Eyes: Negative.   Respiratory: Positive for cough, shortness of breath and wheezing.   Cardiovascular: Positive for chest pain.  Gastrointestinal: Negative.   Genitourinary: Negative.   Musculoskeletal: Negative.   Skin: Negative.   Neurological: Negative.   Endo/Heme/Allergies: Negative.   Psychiatric/Behavioral: Negative.    VITAL SIGNS: BP 116/87   Pulse (!) 102   Temp 98.2 F (36.8 C) (Axillary)   Resp 19   SpO2 96%   VENTILATOR SETTINGS: Vent Mode: BIPAP FiO2 (%):  [40 %] 40 % Set Rate:  [8 bmp] 8 bmp PEEP:  [8 cmH20] 8 cmH20  INTAKE / OUTPUT: I/O last 3 completed shifts: In: -  Out: 20 [Chest Tube:20]  PHYSICAL EXAMINATION: General: WDWN Elderly female lying  on ER stretcher in NAD Neuro: AAOx3, moving all extremities HEENT: OP clear, MM moist, PERRL Cardiovascular: RRR no m/r/g Lungs: CTA b/l, No respiratory distress or accessory muscle use. Right-sided pigtail secured and attached to suction with good tidaling within the tube, but significant air leak.  Abdomen: Soft NTND, BS+ Musculoskeletal: no LE edema Skin: no rashes   LABS:  BMET  Recent Labs Lab 04/18/17 1730  NA 137  K 3.9  CL 103  CO2 23  BUN 13  CREATININE 1.31*  GLUCOSE 219*   Electrolytes  Recent Labs Lab 04/18/17 1730  CALCIUM 9.2   CBC  Recent Labs Lab 04/18/17 1730  WBC 9.4  HGB 12.7  HCT 39.0  PLT 258   Coag's No results for input(s): APTT, INR in the last 168 hours.  Sepsis Markers No results for input(s): LATICACIDVEN, PROCALCITON, O2SATVEN in the last 168 hours.  ABG No results for input(s): PHART, PCO2ART, PO2ART in the last 168 hours.  Liver Enzymes  Recent Labs Lab  04/18/17 1730  AST 39  ALT 27  ALKPHOS 80  BILITOT 0.4  ALBUMIN 3.8   Cardiac Enzymes No results for input(s): TROPONINI, PROBNP in the last 168 hours.  Glucose No results for input(s): GLUCAP in the last 168 hours.  Imaging Dg Chest Port 1 View  Result Date: 04/18/2017 CLINICAL DATA:  Status post chest tube placement. History of sarcoidosis. EXAM: PORTABLE CHEST 1 VIEW COMPARISON:  Earlier today. FINDINGS: Interval right lateral pigtail chest catheter with some side holes projected within the lateral right hemithorax and some projected outside of the hemithorax. Significant decrease in size of the previously demonstrated large right pneumothorax. The residual pneumothorax occupies approximately 10% of the volume of the right hemithorax. No significant change in diffuse prominence of the interstitial markings. Normal sized heart. Unremarkable bones. IMPRESSION: 1. Interval right lateral pigtail chest catheter with some side holes in the hemithorax and some side holes  outside of the hemithorax. 2. Significant decrease in size of the right pneumothorax with a residual approximately 10% pneumothorax. 3. Stable chronic interstitial lung disease compatible with the history of sarcoidosis. Electronically Signed   By: Beckie Salts M.D.   On: 04/18/2017 19:01   Dg Chest Port 1 View  Result Date: 04/18/2017 CLINICAL DATA:  Acute shortness of breath and diminished breath sounds on RIGHT, history sarcoidosis, GERD, asthma EXAM: PORTABLE CHEST 1 VIEW COMPARISON:  Portable exam 1735 hours compared to 06/27/2016 FINDINGS: Normal heart size and mediastinal contours. Large RIGHT pneumothorax with complete collapse of the RIGHT lung. Underlying chronic interstitial lung disease/pulmonary fibrosis. No mediastinal shift. Bones demineralized. IMPRESSION: Large RIGHT pneumothorax with complete collapse of the RIGHT lung without evidence of mediastinal shift/tension. Underlying chronic interstitial lung disease/ pulmonary fibrosis. Critical Value/emergent results were called by telephone at the time of interpretation on 04/18/2017 at 5:52 pm to [facility] PA, who verbally acknowledged these results. Electronically Signed   By: Ulyses Southward M.D.   On: 04/18/2017 17:53   LINES/TUBES: Right-sided pigtail chest tube (8/11) PIV  ASSESSMENT / PLAN: 66yoF with Pulmonary sarcoidosis, Asthma, Chronic Hypoxia (on 2L O2), GERD, Anxiety, Migraines, Grade 1 Diastolic CHF, Allergic rhinitis, and Obesity who presents to the ER today with acute onset SOB, CP, and hypoxia, and was found to have a spontaneous right-sided pneumothorax. She is now s/p pigtail chest tube to suction.   PULMONARY 1. Spontaneous Pneumothorax; Acute-on-Chronic Hypoxic Respiratory failure - CXR on my review shows pigtail chest tube is only partially within her thoracic cavity, appears on the verge of falling out.  - Patient initially insisted someone had already advanced her chest tube although ER staff denied. Repeat CXR  showed it had not been advanced.  - Clinically she has a significant air leak within the chest tube likely due to all the side ports of the chest tube not being within the chest.  - Attempted to advance chest tube but was only minimally successful due to amount of subcutaneous tissue and the tube coiling within that tissue instead of advancing. Repeat CXR after chest tube manipulation shows it is now slightly further advanced within the chest cavity. Still has an air leak. Clinically doing well though. Secured chest tube, placed lidocaine patch, followed by occlusive dressing.  - repeat CXR tomorrow.    2. Pulmonary Sarcoidosis; Asthma: - continue symbicort (160/4.5) 2 puffs BID, duonebs QID  CARDIOVASCULAR 1. Grade 1 Diastolic CHF: - does not appear clinically volume overloaded on my exam; will give gentle IVF's for her AKI but monitor volume  status closely  RENAL 1. AKI: - creatinine 1.31 up from baseline of 1.0-1.11; continue IVF gtt and monitor UOP closely  GASTROINTESTINAL 1. GERD - continue ranitidine 150mg  PO BID and pantoprazole 20mg  daily (both home meds)  HEMATOLOGIC No active issues   INFECTIOUS No active issues   ENDOCRINE No active issues  NEUROLOGIC 1. Hx Migraines   I spent 60 minutes examining this patient, reviewing CXR's, and adjusting chest tube.  Milana Obey, MD  Pulmonary and Critical Care Medicine [facility] Pager: [phone]  04/18/2017, 8:26 PM

## 2017-04-18 NOTE — ED Triage Notes (Signed)
[facility] with acute shortness of breath onset 30 minutes prior to arrival. Patient arrived on cpap. Denies CP and alert on arrival. Patient denies any illness prior to onset of SOB. Has asthma and some wheezing noted. Speaking short sentences and RT at bedside. Patient received albuterol 10 and atrovent .5 pta

## 2017-04-18 NOTE — ED Notes (Signed)
Bipap removed per Will PA-C.  Patient placed on 2L O2.  Patient sitting upright in bed and tolerating nasal cannula well.  Patient called son and gave update to son, Maurine MinisterDennis.  Consult sent awaiting admission to hospital.

## 2017-04-18 NOTE — ED Notes (Signed)
[name] Sharma [phone]

## 2017-04-18 NOTE — ED Notes (Signed)
Gardner MD at bedside  

## 2017-04-18 NOTE — H&P (Signed)
History and Physical    [name] [name] DOB: 1951-07-16 DOA: 04/18/2017  PCP: Pearline Cables, MD  Patient coming from: Home  I have personally briefly reviewed patient's old medical records in [facility]  Chief Complaint: Respiratory distress  HPI: [name] is a 67 y.o. female with medical history significant of pulmonary sarcoid.  Just tapered off of prednisone completely on July 15th.  Followed by Dr. Vassie Loll.  Patient presents to the ED with sudden onset of R sided CP and SOB.  EMS put her on CPAP, 10mg  albuterol and 0.5 Atrovent, best O2 sats they could get was in 80s.   ED Course: single dose of solumedrol in ED.  CXR shows Large R PTX with complete collapse of lung.   Review of Systems: As per HPI otherwise 10 point review of systems negative.   Past Medical History:  Diagnosis Date  . Anxiety   . Asthma   . Barrett esophagus   . GERD (gastroesophageal reflux disease)   . Hiatal hernia   . IBS (irritable bowel syndrome)   . Migraines   . Pulmonary sarcoidosis [facility])     Past Surgical History:  Procedure Laterality Date  . BRAVO PH STUDY N/A 10/10/2013   Procedure: BRAVO PH STUDY;  Surgeon: Hart Carwin, MD;  Location: [facility] ENDOSCOPY;  Service: Endoscopy;  Laterality: N/A;  placed 27  . BRONCHIAL BIOPSY  1996  . CATARACT EXTRACTION, BILATERAL    . CHOLECYSTECTOMY    . ESOPHAGOGASTRODUODENOSCOPY N/A 10/10/2013   Procedure: ESOPHAGOGASTRODUODENOSCOPY (EGD);  Surgeon: Hart Carwin, MD;  Location: [facility];  Service: Endoscopy;  Laterality: N/A;  pt asthmatic, RA 92% at rest.  Coughing frequently. Wheezes noted in upper lung fields at auscultation. Pt instructed to use home inhaler prior to procedure and placed on supportive O2 @ 2 lpm in pre-procedural. Teaching done ie. use of inhaler. Pt states she ju  . NISSEN FUNDOPLICATION    . ROTATOR CUFF REPAIR     x3(2 on L, 1 on R)     reports that she quit smoking about 38 years ago. Her  smoking use included Cigarettes. She has a 20.00 pack-year smoking history. She has never used smokeless tobacco. She reports that she does not drink alcohol or use drugs.  Allergies  Allergen Reactions  . Gabapentin Other (See Comments)    Edema on 2 separate trials  . Lactose Intolerance (Gi) Diarrhea and Other (See Comments)    Stomach cramps  . Penicillins Other (See Comments)    Unknown - childhood allergy Has patient had a PCN reaction causing immediate rash, facial/tongue/throat swelling, SOB or lightheadedness with hypotension: Unknown Has patient had a PCN reaction causing severe rash involving mucus membranes or skin necrosis: Unknown Has patient had a PCN reaction that required hospitalization: Unknown Has patient had a PCN reaction occurring within the last 10 years: No If all of the above answers are "NO", then may proceed with Cephalosporin use.  Marland Kitchen Pineapple Hives and Itching  . Aspirin Other (See Comments)    gastritis    Family History  Problem Relation Age of Onset  . Adopted: Yes     Prior to Admission medications   Medication Sig Start Date End Date Taking? Authorizing Provider  albuterol (VENTOLIN HFA) 108 (90 Base) MCG/ACT inhaler Inhale 2 puffs into the lungs as needed. 12/11/16   Copland, Gwenlyn Found, MD  ALOE VERA JUICE PO Take 4 oz by mouth daily.    [provider]  amitriptyline (ELAVIL) 10 MG tablet Take 1 tablet (10 mg total) by mouth at bedtime. Patient taking differently: Take 10 mg by mouth at bedtime as needed.  02/07/16   Copland, Gwenlyn Found, MD  Ascorbic Acid (VITAMIN C) 1000 MG tablet Take 1,000 mg by mouth every morning.     [provider]  ASTRAGALUS PO Take 1 tablet by mouth every morning.     [provider]  buPROPion (WELLBUTRIN XL) 300 MG 24 hr tablet Take 1 tablet (300 mg total) by mouth daily. 12/12/16   Copland, Gwenlyn Found, MD  Calcium-Magnesium-Vitamin D (CALCIUM 1200+D3 PO) Take by mouth.    [provider]    Collagen Hydrolysate POWD 1 scoop by Does not apply route daily.    [provider]  Cyanocobalamin (VITAMIN B-12) 5000 MCG TBDP Take 5,000 mcg by mouth daily.    [provider]  cycloSPORINE (RESTASIS) 0.05 % ophthalmic emulsion Place 1 drop into both eyes 2 (two) times daily.     [provider]  fluticasone (FLONASE) 50 MCG/ACT nasal spray Place 1 spray into both nostrils 3 (three) times daily.    [provider]  furosemide (LASIX) 20 MG tablet TAKE 1 TABLET (20 MG TOTAL) BY MOUTH DAILY. USE AS NEEDED FOR SWELLING 09/18/16   Copland, Gwenlyn Found, MD  ipratropium (ATROVENT HFA) 17 MCG/ACT inhaler Inhale 2 puffs into the lungs every 6 (six) hours as needed for wheezing. 06/27/16   Saguier, Ramon Dredge, PA-C  loratadine (CLARITIN) 10 MG tablet Take 10 mg by mouth daily.    [provider]  LORazepam (ATIVAN) 0.5 MG tablet TAKE 1/2 TO 1 TABLET BY MOUTH EVERY 8 TO 12 HRS ONLY AS NEEDED 09/18/16   Copland, Gwenlyn Found, MD  Multiple Vitamin (MULTIVITAMIN) capsule Take 1 capsule by mouth every morning.     [provider]  Nasal Moisturizer Combination (RHINASE) SOLN Place 2 sprays into the nose 2 (two) times daily.    [provider]  pantoprazole (PROTONIX) 20 MG tablet TAKE 1 TABLET (20 MG TOTAL) BY MOUTH DAILY. 03/16/17   Armbruster, Reeves Forth, MD  pantoprazole (PROTONIX) 40 MG tablet TAKE 1 TABLET (40 MG TOTAL) BY MOUTH DAILY. TAKE 30-60 MIN BEFORE FIRST MEAL OF THE DAY 08/27/15   Nyoka Cowden, MD  PREBIOTIC PRODUCT PO Take 1 scoop by mouth daily.    [provider]  ranitidine (ZANTAC) 150 MG tablet TAKE 1 TABLET (150 MG TOTAL) BY MOUTH 2 (TWO) TIMES DAILY. 10/03/16   Armbruster, Reeves Forth, MD  SYMBICORT 160-4.5 MCG/ACT inhaler INHALE 2 PUFF INTO THE LUNGS TWICE A DAY AS NEEDED 03/30/17   Oretha Milch, MD    Physical Exam: Vitals:   04/18/17 1746 04/18/17 1752 04/18/17 1815 04/18/17 1830  BP:   (!) 144/84 114/69  Pulse:   (!)  125 (!) 109  Resp:   (!) 28 (!) 27  Temp:      TempSrc:      SpO2: 96% 95% 99% 100%    Constitutional: NAD, calm, comfortable Eyes: PERRL, lids and conjunctivae normal ENMT: Mucous membranes are moist. Posterior pharynx clear of any exudate or lesions.Normal dentition.  Neck: normal, supple, no masses, no thyromegaly Respiratory: Wet sounding cough Cardiovascular: Regular rate and rhythm, no murmurs / rubs / gallops. No extremity edema. 2+ pedal pulses. No carotid bruits.  Abdomen: no tenderness, no masses palpated. No hepatosplenomegaly. Bowel sounds positive.  Musculoskeletal: no clubbing / cyanosis. No joint deformity upper  and lower extremities. Good ROM, no contractures. Normal muscle tone.  Skin: no rashes, lesions, ulcers. No induration Neurologic: CN 2-12 grossly intact. Sensation intact, DTR normal. Strength 5/5 in all 4.  Psychiatric: Normal judgment and insight. Alert and oriented x 3. Normal mood.    Labs on Admission: I have personally reviewed following labs and imaging studies  CBC:  Recent Labs Lab 04/18/17 1730  WBC 9.4  NEUTROABS 1.1*  HGB 12.7  HCT 39.0  MCV 92.9  PLT 258   Basic Metabolic Panel:  Recent Labs Lab 04/18/17 1730  NA 137  K 3.9  CL 103  CO2 23  GLUCOSE 219*  BUN 13  CREATININE 1.31*  CALCIUM 9.2   GFR: CrCl cannot be calculated (Unknown ideal weight.). Liver Function Tests:  Recent Labs Lab 04/18/17 1730  AST 39  ALT 27  ALKPHOS 80  BILITOT 0.4  PROT 8.3*  ALBUMIN 3.8   No results for input(s): LIPASE, AMYLASE in the last 168 hours. No results for input(s): AMMONIA in the last 168 hours. Coagulation Profile: No results for input(s): INR, PROTIME in the last 168 hours. Cardiac Enzymes: No results for input(s): CKTOTAL, CKMB, CKMBINDEX, TROPONINI in the last 168 hours. BNP (last 3 results) No results for input(s): PROBNP in the last 8760 hours. HbA1C: No results for input(s): HGBA1C in the last 72 hours. CBG: No  results for input(s): GLUCAP in the last 168 hours. Lipid Profile: No results for input(s): CHOL, HDL, LDLCALC, TRIG, CHOLHDL, LDLDIRECT in the last 72 hours. Thyroid Function Tests: No results for input(s): TSH, T4TOTAL, FREET4, T3FREE, THYROIDAB in the last 72 hours. Anemia Panel: No results for input(s): VITAMINB12, FOLATE, FERRITIN, TIBC, IRON, RETICCTPCT in the last 72 hours. Urine analysis:    Component Value Date/Time   BILIRUBINUR Negaitve 03/20/2016 1158   PROTEINUR negative 03/20/2016 1158   UROBILINOGEN negative 03/20/2016 1158   NITRITE negative 03/20/2016 1158   LEUKOCYTESUR Negative 03/20/2016 1158    Radiological Exams on Admission: Dg Chest Port 1 View  Result Date: 04/18/2017 CLINICAL DATA:  Status post chest tube placement. History of sarcoidosis. EXAM: PORTABLE CHEST 1 VIEW COMPARISON:  Earlier today. FINDINGS: Interval right lateral pigtail chest catheter with some side holes projected within the lateral right hemithorax and some projected outside of the hemithorax. Significant decrease in size of the previously demonstrated large right pneumothorax. The residual pneumothorax occupies approximately 10% of the volume of the right hemithorax. No significant change in diffuse prominence of the interstitial markings. Normal sized heart. Unremarkable bones. IMPRESSION: 1. Interval right lateral pigtail chest catheter with some side holes in the hemithorax and some side holes outside of the hemithorax. 2. Significant decrease in size of the right pneumothorax with a residual approximately 10% pneumothorax. 3. Stable chronic interstitial lung disease compatible with the history of sarcoidosis. Electronically Signed   By: Beckie SaltsSteven  Reid M.D.   On: 04/18/2017 19:01   Dg Chest Port 1 View  Result Date: 04/18/2017 CLINICAL DATA:  Acute shortness of breath and diminished breath sounds on RIGHT, history sarcoidosis, GERD, asthma EXAM: PORTABLE CHEST 1 VIEW COMPARISON:  Portable exam 1735  hours compared to 06/27/2016 FINDINGS: Normal heart size and mediastinal contours. Large RIGHT pneumothorax with complete collapse of the RIGHT lung. Underlying chronic interstitial lung disease/pulmonary fibrosis. No mediastinal shift. Bones demineralized. IMPRESSION: Large RIGHT pneumothorax with complete collapse of the RIGHT lung without evidence of mediastinal shift/tension. Underlying chronic interstitial lung disease/ pulmonary fibrosis. Critical Value/emergent results were called by telephone at  the time of interpretation on 04/18/2017 at 5:52 pm to [facility] PA, who verbally acknowledged these results. Electronically Signed   By: Ulyses Southward M.D.   On: 04/18/2017 17:53    EKG: Independently reviewed.  Assessment/Plan Principal Problem:   Pneumothorax on right Active Problems:   PULMONARY SARCOIDOSIS   Mild persistent asthma in adult without complication with component of vcd   Chronic respiratory failure with hypoxia (HCC)   Spontaneous pneumothorax    1. PTX on R - spontaneous 1. Chest tube management per PCCM 2. Morphine PRN pain 3. Tele monitor 4. Cont pulse ox 2. Pulm sarcoidosis - asthma 1. Consult to PCCM 2. Continue home nebs 3. Adult wheeze protocol 4. Will defer to PCCM wether she needs further steroids or not given that obvious finding today was a PTX 3. Chronic resp failure with hypoxia - continue O2 via [place]  DVT prophylaxis: Lovenox Code Status: Full Family Communication: No family in room Disposition Plan: Home after admit Consults called: PCCM consulted and will see patient Admission status: Admit to inpatient - inpatient management of chest tube   Hillary Bow. DO [facility] Pager [phone]  If 7AM-7PM, please contact day team taking care of patient [email or web address] Password [facility]  04/18/2017, 7:42 PM

## 2017-04-19 ENCOUNTER — Inpatient Hospital Stay (HOSPITAL_COMMUNITY): Payer: Medicare Other

## 2017-04-19 DIAGNOSIS — J939 Pneumothorax, unspecified: Secondary | ICD-10-CM

## 2017-04-19 LAB — CBC
HCT: 36.5 % (ref 36.0–46.0)
HEMOGLOBIN: 11.9 g/dL — AB (ref 12.0–15.0)
MCH: 29.8 pg (ref 26.0–34.0)
MCHC: 32.6 g/dL (ref 30.0–36.0)
MCV: 91.5 fL (ref 78.0–100.0)
PLATELETS: 358 10*3/uL (ref 150–400)
RBC: 3.99 MIL/uL (ref 3.87–5.11)
RDW: 12.9 % (ref 11.5–15.5)
WBC: 9.9 10*3/uL (ref 4.0–10.5)

## 2017-04-19 LAB — BASIC METABOLIC PANEL
Anion gap: 9 (ref 5–15)
BUN: 13 mg/dL (ref 6–20)
CALCIUM: 9.3 mg/dL (ref 8.9–10.3)
CO2: 25 mmol/L (ref 22–32)
CREATININE: 1.12 mg/dL — AB (ref 0.44–1.00)
Chloride: 103 mmol/L (ref 101–111)
GFR, EST AFRICAN AMERICAN: 57 mL/min — AB (ref >60)
GFR, EST NON AFRICAN AMERICAN: 49 mL/min — AB (ref >60)
Glucose, Bld: 157 mg/dL — ABNORMAL HIGH (ref 65–99)
Potassium: 4.7 mmol/L (ref 3.5–5.1)
SODIUM: 137 mmol/L (ref 135–145)

## 2017-04-19 MED ORDER — IPRATROPIUM-ALBUTEROL 0.5-2.5 (3) MG/3ML IN SOLN
3.0000 mL | Freq: Four times a day (QID) | RESPIRATORY_TRACT | Status: DC
  Administered 2017-04-19 – 2017-04-21 (×10): 3 mL via RESPIRATORY_TRACT
  Filled 2017-04-19 (×10): qty 3

## 2017-04-19 NOTE — Progress Notes (Signed)
PROGRESS NOTE    [name]   BJY:[id number]  DOB: 02-17-1951  DOA: 04/18/2017 PCP: Pearline Cables, MD   Brief Narrative:   [name] is a 66 y.o. female with medical history significant of pulmonary sarcoid.  Just tapered off of prednisone completely on July 15th.  Followed by Dr. Vassie Loll.  Patient presents to the ED with sudden onset of R sided CP and SOB.  EMS put her on CPAP, given Albuterol and Atrovent, hypoxic in 80s, CXR with large right sided pneumothorax.   Subjective: Feels much better today. Minimal dyspnea at rest, moderate with exertion. No chest pain.  ROS: no complaints of nausea, vomiting, constipation diarrhea, cough, dyspnea or dysuria. No other complaints.   Assessment & Plan:   Principal Problem:   Pneumothorax on right - cont Chest tube per pulmonary  Active Problems:   PULMONARY SARCOIDOSIS/  Chronic respiratory failure with hypoxia   - Nebs per pulmonary   Obesity Body mass index is 37.03 kg/m.   DVT prophylaxis: lovenox Code Status: full code Family Communication:  Disposition Plan: home when stable Consultants:   pulmonary Procedures:   Chest tube Antimicrobials:  Anti-infectives    None       Objective: Vitals:   04/19/17 0012 04/19/17 0533 04/19/17 0757 04/19/17 1117  BP:  117/78 119/85 126/78  Pulse:  91 99 90  Resp:  18 (!) 22 (!) 24  Temp:  97.9 F (36.6 C)    TempSrc:  Oral    SpO2: 94% 97% 96% 97%  Weight:  100.9 kg (222 lb 8 oz)      Intake/Output Summary (Last 24 hours) at 04/19/17 1438 Last data filed at 04/19/17 1000  Gross per 24 hour  Intake           521.25 ml  Output             1580 ml  Net         -1058.75 ml   Filed Weights   04/18/17 2102 04/19/17 0533  Weight: 100.4 kg (221 lb 6.4 oz) 100.9 kg (222 lb 8 oz)    Examination: General exam: Appears comfortable  HEENT: PERRLA, oral mucosa moist, no sclera icterus or thrush Respiratory system: mild basilar crackles. Respiratory effort  normal. Cardiovascular system: S1 & S2 heard, RRR.  No murmurs  Gastrointestinal system: Abdomen soft, non-tender, nondistended. Normal bowel sound. No organomegaly Central nervous system: Alert and oriented. No focal neurological deficits. Extremities: No cyanosis, clubbing or edema Skin: No rashes or ulcers Psychiatry:  Mood & affect appropriate.     Data Reviewed: I have personally reviewed following labs and imaging studies  CBC:  Recent Labs Lab 04/18/17 1730 04/19/17 0241  WBC 9.4 9.9  NEUTROABS 1.1*  --   HGB 12.7 11.9*  HCT 39.0 36.5  MCV 92.9 91.5  PLT 258 358   Basic Metabolic Panel:  Recent Labs Lab 04/18/17 1730 04/19/17 0241  NA 137 137  K 3.9 4.7  CL 103 103  CO2 23 25  GLUCOSE 219* 157*  BUN 13 13  CREATININE 1.31* 1.12*  CALCIUM 9.2 9.3   GFR: Estimated Creatinine Clearance: 56.6 mL/min (A) (by C-G formula based on SCr of 1.12 mg/dL (H)). Liver Function Tests:  Recent Labs Lab 04/18/17 1730  AST 39  ALT 27  ALKPHOS 80  BILITOT 0.4  PROT 8.3*  ALBUMIN 3.8   No results for input(s): LIPASE, AMYLASE in the last 168 hours. No results for input(s):  AMMONIA in the last 168 hours. Coagulation Profile: No results for input(s): INR, PROTIME in the last 168 hours. Cardiac Enzymes: No results for input(s): CKTOTAL, CKMB, CKMBINDEX, TROPONINI in the last 168 hours. BNP (last 3 results) No results for input(s): PROBNP in the last 8760 hours. HbA1C: No results for input(s): HGBA1C in the last 72 hours. CBG: No results for input(s): GLUCAP in the last 168 hours. Lipid Profile: No results for input(s): CHOL, HDL, LDLCALC, TRIG, CHOLHDL, LDLDIRECT in the last 72 hours. Thyroid Function Tests: No results for input(s): TSH, T4TOTAL, FREET4, T3FREE, THYROIDAB in the last 72 hours. Anemia Panel: No results for input(s): VITAMINB12, FOLATE, FERRITIN, TIBC, IRON, RETICCTPCT in the last 72 hours. Urine analysis:    Component Value Date/Time    BILIRUBINUR Negaitve 03/20/2016 1158   PROTEINUR negative 03/20/2016 1158   UROBILINOGEN negative 03/20/2016 1158   NITRITE negative 03/20/2016 1158   LEUKOCYTESUR Negative 03/20/2016 1158   Sepsis Labs: @LABRCNTIP (procalcitonin:4,lacticidven:4) )No results found for this or any previous visit (from the past 240 hour(s)).       Radiology Studies: Dg Chest Port 1 View  Result Date: 04/19/2017 CLINICAL DATA:  Right-sided chest tube repositioning. Initial encounter. EXAM: PORTABLE CHEST 1 VIEW COMPARISON:  Chest radiograph performed earlier today at 10:22 p.m. FINDINGS: There is a persistent small right apical pneumothorax, stable from the prior study. The right-sided chest tube has been advanced. Patchy bibasilar airspace opacities raise concern for pulmonary edema. Mild vascular congestion is noted. No definite pleural effusion is seen. The cardiomediastinal silhouette is borderline normal in size. No acute osseous abnormalities are identified. Soft tissue air is noted along the right chest wall. IMPRESSION: 1. Persistent small right apical pneumothorax is stable from the recent prior study. Right-sided chest tube has been advanced. 2. Patchy bilateral airspace opacities raise concern for pulmonary edema. Mild vascular congestion noted. Electronically Signed   By: Roanna RaiderJeffery  Chang M.D.   On: 04/19/2017 00:24   Dg Chest Port 1 View  Result Date: 04/18/2017 CLINICAL DATA:  66 y/o  F; pneumothorax.  History of sarcoidosis. EXAM: PORTABLE CHEST 1 VIEW COMPARISON:  04/18/2017 chest radiograph FINDINGS: Stable right-sided pneumothorax. No leftward mediastinal shift. Stable position of right-sided pigtail catheter partially in right chest wall and partially pleural space. Interval increase in pneumatosis within the right chest wall. Stable diffuse coarse reticular opacities of the lungs. Stable cardiac silhouette. IMPRESSION: 1. Stable right pneumothorax. Stable position of right pigtail catheter  partially in the right chest wall and partially in pleural space. Interval increase in right chest wall pneumatosis. 2. Stable coarse reticular opacities compatible with chronic interstitial lung disease. Electronically Signed   By: Mitzi HansenLance  Furusawa-Stratton M.D.   On: 04/18/2017 22:41   Dg Chest Port 1 View  Result Date: 04/18/2017 CLINICAL DATA:  Status post chest tube placement. History of sarcoidosis. EXAM: PORTABLE CHEST 1 VIEW COMPARISON:  Earlier today. FINDINGS: Interval right lateral pigtail chest catheter with some side holes projected within the lateral right hemithorax and some projected outside of the hemithorax. Significant decrease in size of the previously demonstrated large right pneumothorax. The residual pneumothorax occupies approximately 10% of the volume of the right hemithorax. No significant change in diffuse prominence of the interstitial markings. Normal sized heart. Unremarkable bones. IMPRESSION: 1. Interval right lateral pigtail chest catheter with some side holes in the hemithorax and some side holes outside of the hemithorax. 2. Significant decrease in size of the right pneumothorax with a residual approximately 10% pneumothorax. 3. Stable chronic interstitial  lung disease compatible with the history of sarcoidosis. Electronically Signed   By: Beckie Salts M.D.   On: 04/18/2017 19:01   Dg Chest Port 1 View  Result Date: 04/18/2017 CLINICAL DATA:  Acute shortness of breath and diminished breath sounds on RIGHT, history sarcoidosis, GERD, asthma EXAM: PORTABLE CHEST 1 VIEW COMPARISON:  Portable exam 1735 hours compared to 06/27/2016 FINDINGS: Normal heart size and mediastinal contours. Large RIGHT pneumothorax with complete collapse of the RIGHT lung. Underlying chronic interstitial lung disease/pulmonary fibrosis. No mediastinal shift. Bones demineralized. IMPRESSION: Large RIGHT pneumothorax with complete collapse of the RIGHT lung without evidence of mediastinal shift/tension.  Underlying chronic interstitial lung disease/ pulmonary fibrosis. Critical Value/emergent results were called by telephone at the time of interpretation on 04/18/2017 at 5:52 pm to [facility], who verbally acknowledged these results. Electronically Signed   By: Ulyses Southward M.D.   On: 04/18/2017 17:53      Scheduled Meds: . arformoterol  15 mcg Nebulization BID  . budesonide (PULMICORT) nebulizer solution  0.5 mg Nebulization BID  . buPROPion  300 mg Oral Daily  . cycloSPORINE  1 drop Both Eyes BID  . enoxaparin (LOVENOX) injection  40 mg Subcutaneous Q24H  . famotidine  20 mg Oral BID  . fluticasone  1 spray Each Nare TID  . ipratropium-albuterol  3 mL Nebulization QID  . lidocaine  1 patch Transdermal Q24H  . loratadine  10 mg Oral Daily  . multivitamin with minerals  1 tablet Oral Daily  . pantoprazole  20 mg Oral Daily   Continuous Infusions: . sodium chloride 75 mL/hr at 04/19/17 1213     LOS: 1 day    Time spent in minutes: 35    Calvert Cantor, MD [facility] Pager: [email or web address] Password [facility] 04/19/2017, 2:38 PM

## 2017-04-19 NOTE — Progress Notes (Signed)
Name: [name] MRN: [id number] DOB: 10-13-1950    ADMISSION DATE:  04/18/2017 CONSULTATION DATE: 04/18/17  REFERRING MD :  Dr. Julian Reil   CHIEF COMPLAINT:  Pneumothorax   BRIEF PATIENT DESCRIPTION: 66yoF with Pulmonary sarcoidosis, Asthma, Chronic Hypoxia (on 2L O2), GERD, Anxiety, Migraines, Grade 1 Diastolic CHF, Allergic rhinitis, and Obesity who presented to ER on 8/11 with acute onset SOB, CP, and hypoxia, and was found to have a spontaneous right-sided pneumothorax. She is now s/p pigtail chest tube to suction.   SIGNIFICANT EVENTS    STUDIES:     HISTORY OF PRESENT ILLNESS:  66yoF with Pulmonary sarcoidosis, Asthma, Chronic Hypoxia (on 2L O2), GERD, Anxiety, Migraines, Grade 1 Diastolic CHF, Allergic rhinitis, and Obesity who presents to the ER today with acute onset severe SOB and worsening hypoxia. She reports she turned her O2 from 2L to 4L and was still hypoxic. She says she thought she was having a heart attack as she also had chest pain in her right breast. She reports chronic forceful cough and said this SOB started after a coughing episode. Denies sputum. Reportedly was wheezing on arrival to the ER but denies any now. Denies falls or trauma.   SUBJECTIVE:  Feeling better . Alert and pleasant. O2 sats adequate on 3l/m .  Chest pain is better.  Right chest tube /pig tail in place + leak .   VITAL SIGNS: Temp:  [97.9 F (36.6 C)-98.9 F (37.2 C)] 97.9 F (36.6 C) (08/12 0533) Pulse Rate:  [90-126] 90 (08/12 1117) Resp:  [14-31] 24 (08/12 1117) BP: (114-155)/(69-110) 126/78 (08/12 1117) SpO2:  [94 %-100 %] 97 % (08/12 1117) FiO2 (%):  [40 %] 40 % (08/11 1748) Weight:  [221 lb 6.4 oz (100.4 kg)-222 lb 8 oz (100.9 kg)] 222 lb 8 oz (100.9 kg) (08/12 0533)  PHYSICAL EXAMINATION: General:  NAD , in bed  Neuro:  A/ox 3  , Mae x 4  HEENT:  Poor dentition ., moist mucosa  Cardiovascular:  RRR , No m/r/g.  Lungs:  CTA no wheezing  Abdomen:  Soft /NT BS +    Musculoskeletal:  Intact  Skin: no rash .    Recent Labs Lab 04/18/17 1730 04/19/17 0241  NA 137 137  K 3.9 4.7  CL 103 103  CO2 23 25  BUN 13 13  CREATININE 1.31* 1.12*  GLUCOSE 219* 157*    Recent Labs Lab 04/18/17 1730 04/19/17 0241  HGB 12.7 11.9*  HCT 39.0 36.5  WBC 9.4 9.9  PLT 258 358   Dg Chest Port 1 View  Result Date: 04/19/2017 CLINICAL DATA:  Right-sided chest tube repositioning. Initial encounter. EXAM: PORTABLE CHEST 1 VIEW COMPARISON:  Chest radiograph performed earlier today at 10:22 p.m. FINDINGS: There is a persistent small right apical pneumothorax, stable from the prior study. The right-sided chest tube has been advanced. Patchy bibasilar airspace opacities raise concern for pulmonary edema. Mild vascular congestion is noted. No definite pleural effusion is seen. The cardiomediastinal silhouette is borderline normal in size. No acute osseous abnormalities are identified. Soft tissue air is noted along the right chest wall. IMPRESSION: 1. Persistent small right apical pneumothorax is stable from the recent prior study. Right-sided chest tube has been advanced. 2. Patchy bilateral airspace opacities raise concern for pulmonary edema. Mild vascular congestion noted. Electronically Signed   By: Roanna Raider M.D.   On: 04/19/2017 00:24   Dg Chest Port 1 View  Result Date: 04/18/2017 CLINICAL DATA:  66  y/o  F; pneumothorax.  History of sarcoidosis. EXAM: PORTABLE CHEST 1 VIEW COMPARISON:  04/18/2017 chest radiograph FINDINGS: Stable right-sided pneumothorax. No leftward mediastinal shift. Stable position of right-sided pigtail catheter partially in right chest wall and partially pleural space. Interval increase in pneumatosis within the right chest wall. Stable diffuse coarse reticular opacities of the lungs. Stable cardiac silhouette. IMPRESSION: 1. Stable right pneumothorax. Stable position of right pigtail catheter partially in the right chest wall and partially  in pleural space. Interval increase in right chest wall pneumatosis. 2. Stable coarse reticular opacities compatible with chronic interstitial lung disease. Electronically Signed   By: Mitzi HansenLance  Furusawa-Stratton M.D.   On: 04/18/2017 22:41   Dg Chest Port 1 View  Result Date: 04/18/2017 CLINICAL DATA:  Status post chest tube placement. History of sarcoidosis. EXAM: PORTABLE CHEST 1 VIEW COMPARISON:  Earlier today. FINDINGS: Interval right lateral pigtail chest catheter with some side holes projected within the lateral right hemithorax and some projected outside of the hemithorax. Significant decrease in size of the previously demonstrated large right pneumothorax. The residual pneumothorax occupies approximately 10% of the volume of the right hemithorax. No significant change in diffuse prominence of the interstitial markings. Normal sized heart. Unremarkable bones. IMPRESSION: 1. Interval right lateral pigtail chest catheter with some side holes in the hemithorax and some side holes outside of the hemithorax. 2. Significant decrease in size of the right pneumothorax with a residual approximately 10% pneumothorax. 3. Stable chronic interstitial lung disease compatible with the history of sarcoidosis. Electronically Signed   By: Beckie SaltsSteven  Reid M.D.   On: 04/18/2017 19:01   Dg Chest Port 1 View  Result Date: 04/18/2017 CLINICAL DATA:  Acute shortness of breath and diminished breath sounds on RIGHT, history sarcoidosis, GERD, asthma EXAM: PORTABLE CHEST 1 VIEW COMPARISON:  Portable exam 1735 hours compared to 06/27/2016 FINDINGS: Normal heart size and mediastinal contours. Large RIGHT pneumothorax with complete collapse of the RIGHT lung. Underlying chronic interstitial lung disease/pulmonary fibrosis. No mediastinal shift. Bones demineralized. IMPRESSION: Large RIGHT pneumothorax with complete collapse of the RIGHT lung without evidence of mediastinal shift/tension. Underlying chronic interstitial lung disease/  pulmonary fibrosis. Critical Value/emergent results were called by telephone at the time of interpretation on 04/18/2017 at 5:52 pm to [facility], who verbally acknowledged these results. Electronically Signed   By: Ulyses SouthwardMark  Boles M.D.   On: 04/18/2017 17:53    ASSESSMENT / PLAN:  1. Spontaneous Right Pneumothorax with underlying pulmonary fibrosis from Sarcoid .  Plan  Cont right chest tube  Check cxr today  And in am .   2. Pulmonary Sarcoid /Asthma   Plan  Cont on Nebs .  3. Chronic Resp Failure on Home O2  Plan  Cont on O2 to keep sats >90%.   Rubye Oaksammy Azam Gervasi NP -C  Pulmonary and Critical Care Medicine [facility] Pager: [phone]  04/19/2017, 12:55 PM

## 2017-04-20 ENCOUNTER — Inpatient Hospital Stay (HOSPITAL_COMMUNITY): Payer: Medicare Other

## 2017-04-20 DIAGNOSIS — J9311 Primary spontaneous pneumothorax: Secondary | ICD-10-CM

## 2017-04-20 DIAGNOSIS — D869 Sarcoidosis, unspecified: Secondary | ICD-10-CM

## 2017-04-20 LAB — POCT I-STAT TROPONIN I: Troponin i, poc: 0.02 ng/mL (ref 0.00–0.08)

## 2017-04-20 MED ORDER — BENZONATATE 100 MG PO CAPS
100.0000 mg | ORAL_CAPSULE | Freq: Three times a day (TID) | ORAL | Status: DC | PRN
  Administered 2017-04-20 – 2017-04-21 (×2): 100 mg via ORAL
  Filled 2017-04-20 (×3): qty 1

## 2017-04-20 MED ORDER — HYDROCOD POLST-CPM POLST ER 10-8 MG/5ML PO SUER
5.0000 mL | Freq: Two times a day (BID) | ORAL | Status: DC
  Administered 2017-04-20 – 2017-05-12 (×45): 5 mL via ORAL
  Filled 2017-04-20 (×45): qty 5

## 2017-04-20 NOTE — Progress Notes (Signed)
Name: [name] MRN: [id number] DOB: [birth year]    ADMISSION DATE:  04/18/2017 CONSULTATION DATE: 04/18/17  REFERRING MD :  Dr. Julian Reil   CHIEF COMPLAINT:  Pneumothorax   BRIEF PATIENT DESCRIPTION: 66yoF with Pulmonary sarcoidosis, Asthma, Chronic Hypoxia (on 2L O2), GERD, Anxiety, Migraines, Grade 1 Diastolic CHF, Allergic rhinitis, and Obesity who presented to ER on 8/11 with acute onset SOB, CP, and hypoxia, and was found to have a spontaneous right-sided pneumothorax. She is now s/p pigtail chest tube to suction.   SUBJECTIVE:  Feeling better, no complaints of SOB or CP C/o of severe intermittent coughing spells which makes her wheeze, sputum production/color unchanged Right chest tube /pig tail in place + leak .   VITAL SIGNS: Temp:  [98.2 F (36.8 C)-98.7 F (37.1 C)] 98.2 F (36.8 C) (08/13 0747) Pulse Rate:  [90-110] 101 (08/13 0826) Resp:  [15-24] 18 (08/13 0826) BP: (97-128)/(56-86) 97/56 (08/13 0826) SpO2:  [94 %-99 %] 97 % (08/13 0826) Weight:  [223 lb 9.6 oz (101.4 kg)] 223 lb 9.6 oz (101.4 kg) (08/13 0453)  PHYSICAL EXAMINATION: General:  Adult female sitting in bed in NAD Neuro:  AOx3, MAE, non focal HEENT:  Poor dentition, moist mucosa  Cardiovascular:  RRR , No murmur Lungs:  Even, non-labored, exp wheeze on right, bibasilar dry crackles posteriorly, right pigtail CT to 20 cm suction with 1-2/7 airleak Abdomen:  Soft /NT, BS +  Musculoskeletal:  Intact  Skin: no rash, w/d   Recent Labs Lab 04/18/17 1730 04/19/17 0241  NA 137 137  K 3.9 4.7  CL 103 103  CO2 23 25  BUN 13 13  CREATININE 1.31* 1.12*  GLUCOSE 219* 157*    Recent Labs Lab 04/18/17 1730 04/19/17 0241  HGB 12.7 11.9*  HCT 39.0 36.5  WBC 9.4 9.9  PLT 258 358   Ct Chest Wo Contrast  Result Date: 04/19/2017 CLINICAL DATA:  66 year old female with history of right-sided pneumothorax. EXAM: CT CHEST WITHOUT CONTRAST TECHNIQUE: Multidetector CT imaging of the chest was  performed following the standard protocol without IV contrast. COMPARISON:  Chest CTA 05/28/2016. FINDINGS: Cardiovascular: Heart size is normal. There is no significant pericardial fluid, thickening or pericardial calcification. Aortic atherosclerosis. No definite coronary artery calcifications. Dilatation of the pulmonic trunk (3.7 cm in diameter). Mediastinum/Nodes: Multiple enlarged mediastinal and hilar lymph nodes are again noted, measuring up to 1.4 cm in short axis in the prevascular nodal station and 1.9 cm in short axis in the low right paratracheal nodal station. Esophagus is unremarkable in appearance. No axillary lymphadenopathy. Lungs/Pleura: Widespread areas of traction bronchiectasis and honeycombing are noted, most evident throughout the mid to lower lungs bilaterally, but also present in the posterior aspect of the upper lobes of the lungs bilaterally. These findings have clearly progressed compared to prior study 06/06/2016. No acute consolidative airspace disease. No pleural effusions. Small right-sided pneumothorax, predominantly in the apex of the right hemithorax. Right-sided chest tube in position with pigtail reformed inside the right hemithorax up against the chest wall. Some apical bullous changes are noted bilaterally (right greater than left). Mild centrilobular and paraseptal emphysema is noted. Upper Abdomen: Suture line near the gastroesophageal junction. Status post cholecystectomy. Musculoskeletal: Extensive subcutaneous emphysema throughout the right chest wall tracking caudally into the right flank subcutaneous fat and musculature, and tracking cephalad into the lower right cervical region. There are no aggressive appearing lytic or blastic lesions noted in the visualized portions of the skeleton. IMPRESSION: 1. Right-sided chest tube  appears appropriately positioned. There is a very small residual right-sided pneumothorax. 2. The appearance of lungs is unusual. Overall, the  spectrum of fibrotic changes is favored to reflect progressive usual interstitial pneumonia (UIP). The unusual distribution of additional fibrotic areas in the mid to upper lungs is presumably reflective of underlying sarcoidosis as well. 3. Mild diffuse bronchial wall thickening with mild centrilobular and paraseptal emphysema. 4. Aortic atherosclerosis. 5. Dilatation of the pulmonic trunk (3.7 cm in diameter), concerning for pulmonary arterial hypertension. Aortic Atherosclerosis (ICD10-I70.0) and Emphysema (ICD10-J43.9). Electronically Signed   By: Trudie Reedaniel  Entrikin M.D.   On: 04/19/2017 21:27   Dg Chest Port 1 View  Result Date: 04/20/2017 CLINICAL DATA:  Sarcoidosis, asthma, chronic respiratory failure with hypoxia, right-sided pneumothorax with chest tube treatment. EXAM: PORTABLE CHEST 1 VIEW COMPARISON:  Chest x-ray as well as chest CT scan of April 19, 2017 FINDINGS: There remains an approximately 20% right-sided pneumothorax. The small caliber chest tube is in stable position projecting over the lateral aspect of the mid ribcage. There is considerable subcutaneous emphysema on the right which is stable. There is no significant mediastinal shift. The interstitial markings of both lungs remain increased. The heart is top-normal in size. There is a hiatal hernia. IMPRESSION: Fairly stable appearance of the approximately 20% right-sided pneumothorax. The small caliber right-sided chest tube is in stable position but lies inferior to the pneumothorax. Stable subcutaneous emphysema on the right. Stable chronic interstitial changes bilaterally most compatible with the history of reactive airway disease and/or sarcoidosis. Electronically Signed   By: David  SwazilandJordan M.D.   On: 04/20/2017 07:39   Dg Chest Port 1 View  Result Date: 04/19/2017 CLINICAL DATA:  RIGHT-sided pneumothorax, chest tube, history sarcoidosis EXAM: PORTABLE CHEST 1 VIEW COMPARISON:  Portable exam 1403 hours compared to 04/18/2017  FINDINGS: Pigtail RIGHT thoracostomy tube again noted. Slight increase in size of RIGHT pneumothorax. No mediastinal shift. Diffuse pulmonary infiltrates with a predominance in the mid to lower lungs question pulmonary edema superimposed upon a background of pulmonary fibrosis, increased versus 06/27/2016. No gross pleural effusion. Bones demineralized. IMPRESSION: Slight increase in size of RIGHT pneumothorax despite thoracostomy tube. Pulmonary fibrosis with suspect mild superimposed pulmonary edema. Electronically Signed   By: Ulyses SouthwardMark  Boles M.D.   On: 04/19/2017 15:24   Dg Chest Port 1 View  Result Date: 04/19/2017 CLINICAL DATA:  Right-sided chest tube repositioning. Initial encounter. EXAM: PORTABLE CHEST 1 VIEW COMPARISON:  Chest radiograph performed earlier today at 10:22 p.m. FINDINGS: There is a persistent small right apical pneumothorax, stable from the prior study. The right-sided chest tube has been advanced. Patchy bibasilar airspace opacities raise concern for pulmonary edema. Mild vascular congestion is noted. No definite pleural effusion is seen. The cardiomediastinal silhouette is borderline normal in size. No acute osseous abnormalities are identified. Soft tissue air is noted along the right chest wall. IMPRESSION: 1. Persistent small right apical pneumothorax is stable from the recent prior study. Right-sided chest tube has been advanced. 2. Patchy bilateral airspace opacities raise concern for pulmonary edema. Mild vascular congestion noted. Electronically Signed   By: Roanna RaiderJeffery  Chang M.D.   On: 04/19/2017 00:24   Dg Chest Port 1 View  Result Date: 04/18/2017 CLINICAL DATA:  66 y/o  F; pneumothorax.  History of sarcoidosis. EXAM: PORTABLE CHEST 1 VIEW COMPARISON:  04/18/2017 chest radiograph FINDINGS: Stable right-sided pneumothorax. No leftward mediastinal shift. Stable position of right-sided pigtail catheter partially in right chest wall and partially pleural space. Interval increase in  pneumatosis  within the right chest wall. Stable diffuse coarse reticular opacities of the lungs. Stable cardiac silhouette. IMPRESSION: 1. Stable right pneumothorax. Stable position of right pigtail catheter partially in the right chest wall and partially in pleural space. Interval increase in right chest wall pneumatosis. 2. Stable coarse reticular opacities compatible with chronic interstitial lung disease. Electronically Signed   By: Mitzi Hansen M.D.   On: 04/18/2017 22:41   Dg Chest Port 1 View  Result Date: 04/18/2017 CLINICAL DATA:  Status post chest tube placement. History of sarcoidosis. EXAM: PORTABLE CHEST 1 VIEW COMPARISON:  Earlier today. FINDINGS: Interval right lateral pigtail chest catheter with some side holes projected within the lateral right hemithorax and some projected outside of the hemithorax. Significant decrease in size of the previously demonstrated large right pneumothorax. The residual pneumothorax occupies approximately 10% of the volume of the right hemithorax. No significant change in diffuse prominence of the interstitial markings. Normal sized heart. Unremarkable bones. IMPRESSION: 1. Interval right lateral pigtail chest catheter with some side holes in the hemithorax and some side holes outside of the hemithorax. 2. Significant decrease in size of the right pneumothorax with a residual approximately 10% pneumothorax. 3. Stable chronic interstitial lung disease compatible with the history of sarcoidosis. Electronically Signed   By: Beckie Salts M.D.   On: 04/18/2017 19:01   Dg Chest Port 1 View  Result Date: 04/18/2017 CLINICAL DATA:  Acute shortness of breath and diminished breath sounds on RIGHT, history sarcoidosis, GERD, asthma EXAM: PORTABLE CHEST 1 VIEW COMPARISON:  Portable exam 1735 hours compared to 06/27/2016 FINDINGS: Normal heart size and mediastinal contours. Large RIGHT pneumothorax with complete collapse of the RIGHT lung. Underlying chronic  interstitial lung disease/pulmonary fibrosis. No mediastinal shift. Bones demineralized. IMPRESSION: Large RIGHT pneumothorax with complete collapse of the RIGHT lung without evidence of mediastinal shift/tension. Underlying chronic interstitial lung disease/ pulmonary fibrosis. Critical Value/emergent results were called by telephone at the time of interpretation on 04/18/2017 at 5:52 pm to [facility], who verbally acknowledged these results. Electronically Signed   By: Ulyses Southward M.D.   On: 04/18/2017 17:53    STUDIES:  PORT CXR 8/12:  Questionable lateral placement of percutaneous chest tube. Increased size of apical right pneumothorax. CT chest 8/12:  Right-sided chest tube appears appropriately positioned w/very small residual right-sided pneumothorax; fibrotic changes is favored to reflect progressive usual interstitial pneumonia (UIP). The unusual distribution of additional fibrotic areas in the mid to upper lungs is presumably reflective of underlying sarcoidosis as well;  Mild diffuse bronchial wall thickening with mild centrilobular and paraseptal emphysema;  Aortic atherosclerosis; Dilatation of the pulmonic trunk (3.7 cm in diameter), concerning for pulmonary arterial hypertension. PORT CXR 8/13:  Stable appearance of approx 20% right sided pneumothorax; right CT om stable position but lies inferior to the pneumothorax; stable subcutaneous emphysema on the right.     ASSESSMENT/PLAN: 66 y.o. female with underlying sarcoidosis and spontaneous right-sided pneumothorax. This is her first pneumothorax. Reaccumulation of air on the right likely secondary to the fact that her chest tube was to intermittent suction. Respiratory status at this time is stable. Questionable tube placement on x-ray imaging.  1. Spontaneous right pneumothorax:  Continue CT to 20cm suction, air leak continues.  CT chest shows appropriately positioned pigtail.  CXR shows stable 20% right pnx.  Will continue  monitor/ Trend CXR.  If air leak does not resolve and/or lung reexpand, will need CVTS consult. 2. Pulmonary sarcoidosis: No symptoms to suggest sarcoid flare.  CT chest show progression of disease since 2017. Patient continuing on nebulized twice daily Pulmicort and Brovana as well as 4 times a day DuoNeb. Tessalon added to assist in cough suppression. 3. Chronic hypoxic respiratory failure: Continuing oxygen therapy for goal saturation greater than 92%. 4. GERD: continue protonix and pepcid 5. Environmental allergies: Flonase and Claritin continued.     Posey Boyer, AGACNP-BC [facility] Pgr: [phone] or if no answer [phone] 04/20/2017, 9:29 AM   Attending Note:  I have examined patient, reviewed labs, studies and notes. I have discussed the case with B Simpson, and I agree with the data and plans as amended above. 66 year old woman with a history of sarcoidosis and associated interstitial lung disease admitted with a right spontaneous pneumothorax. A pigtail chest tube was placed with only partial resolution of the pneumothorax. On my evaluation today she is up to chair, comfortable. She has had some cough which may be contributing to her persistent air leak. On my exam she has a 2/6 air leak on -20 cm water, bilateral crackles on inspiration. Her interstitial disease may be contribute in some to the failure of her upper lobe reexpand. More concerning is the fact she probably still has parenchymal defect as evidence by continued air leak. I will increase her suction to -40 today, follow her chest x-ray. If she continues to have an air leak or if her upper lobe does not reexpand then we may need to involve thoracic surgery.   Levy Pupa, MD, PhD 04/20/2017, 2:24 PM [facility] [phone] or if no answer [phone]

## 2017-04-20 NOTE — Progress Notes (Signed)
PROGRESS NOTE    Kiara SeedsBarbara V Mkrtchyan   UEA:[id number]  DOB: 1951/05/05  DOA: 04/18/2017 PCP: Pearline Cablesopland, Jessica C, MD   Brief Narrative:   [name] is a 66 y.o. female with medical history significant of pulmonary sarcoid.  Just tapered off of prednisone completely on July 15th.  Followed by Dr. Vassie LollAlva.  Patient presents to the ED with sudden onset of R sided CP and SOB.  EMS put her on CPAP, given Albuterol and Atrovent, hypoxic in 80s, CXR with large right sided pneumothorax.   Subjective: No complaints today.  ROS: no complaints of nausea, vomiting, constipation diarrhea, cough, dyspnea or dysuria. No other complaints.   Assessment & Plan:   Principal Problem:   Pneumothorax on right - cont Chest tube per pulmonary  Active Problems:   PULMONARY SARCOIDOSIS/  Chronic respiratory failure with hypoxia   - Nebs per pulmonary   Obesity Body mass index is 37.21 kg/m.   DVT prophylaxis: lovenox Code Status: full code Family Communication:  Disposition Plan: home when stable Consultants:   pulmonary Procedures:   Chest tube Antimicrobials:  Anti-infectives    None       Objective: Vitals:   04/19/17 2335 04/20/17 0453 04/20/17 0747 04/20/17 0826  BP: 103/64 113/75  (!) 97/56  Pulse: 95 94 92 (!) 101  Resp: 16 16 15 18   Temp: 98.6 F (37 C) 98.3 F (36.8 C) 98.2 F (36.8 C)   TempSrc: Oral Oral Oral   SpO2: 96% 94% 97% 97%  Weight:  101.4 kg (223 lb 9.6 oz)      Intake/Output Summary (Last 24 hours) at 04/20/17 1117 Last data filed at 04/20/17 0900  Gross per 24 hour  Intake              240 ml  Output              240 ml  Net                0 ml   Filed Weights   04/18/17 2102 04/19/17 0533 04/20/17 0453  Weight: 100.4 kg (221 lb 6.4 oz) 100.9 kg (222 lb 8 oz) 101.4 kg (223 lb 9.6 oz)    Examination: General exam: Appears comfortable  HEENT: PERRLA, oral mucosa moist, no sclera icterus or thrush Respiratory system: Clear to auscultation.  Respiratory effort normal. Cardiovascular system: S1 & S2 heard,  No murmurs  Gastrointestinal system: Abdomen soft, non-tender, nondistended. Normal bowel sound. No organomegaly Central nervous system: Alert and oriented. No focal neurological deficits. Extremities: No cyanosis, clubbing or edema Skin: No rashes or ulcers Psychiatry:  Mood & affect appropriate.   Data Reviewed: I have personally reviewed following labs and imaging studies  CBC:  Recent Labs Lab 04/18/17 1730 04/19/17 0241  WBC 9.4 9.9  NEUTROABS 1.1*  --   HGB 12.7 11.9*  HCT 39.0 36.5  MCV 92.9 91.5  PLT 258 358   Basic Metabolic Panel:  Recent Labs Lab 04/18/17 1730 04/19/17 0241  NA 137 137  K 3.9 4.7  CL 103 103  CO2 23 25  GLUCOSE 219* 157*  BUN 13 13  CREATININE 1.31* 1.12*  CALCIUM 9.2 9.3   GFR: Estimated Creatinine Clearance: 56.8 mL/min (A) (by C-G formula based on SCr of 1.12 mg/dL (H)). Liver Function Tests:  Recent Labs Lab 04/18/17 1730  AST 39  ALT 27  ALKPHOS 80  BILITOT 0.4  PROT 8.3*  ALBUMIN 3.8   No results for input(s):  LIPASE, AMYLASE in the last 168 hours. No results for input(s): AMMONIA in the last 168 hours. Coagulation Profile: No results for input(s): INR, PROTIME in the last 168 hours. Cardiac Enzymes: No results for input(s): CKTOTAL, CKMB, CKMBINDEX, TROPONINI in the last 168 hours. BNP (last 3 results) No results for input(s): PROBNP in the last 8760 hours. HbA1C: No results for input(s): HGBA1C in the last 72 hours. CBG: No results for input(s): GLUCAP in the last 168 hours. Lipid Profile: No results for input(s): CHOL, HDL, LDLCALC, TRIG, CHOLHDL, LDLDIRECT in the last 72 hours. Thyroid Function Tests: No results for input(s): TSH, T4TOTAL, FREET4, T3FREE, THYROIDAB in the last 72 hours. Anemia Panel: No results for input(s): VITAMINB12, FOLATE, FERRITIN, TIBC, IRON, RETICCTPCT in the last 72 hours. Urine analysis:    Component Value Date/Time     BILIRUBINUR Negaitve 03/20/2016 1158   PROTEINUR negative 03/20/2016 1158   UROBILINOGEN negative 03/20/2016 1158   NITRITE negative 03/20/2016 1158   LEUKOCYTESUR Negative 03/20/2016 1158   Sepsis Labs: @LABRCNTIP (procalcitonin:4,lacticidven:4) )No results found for this or any previous visit (from the past 240 hour(s)).       Radiology Studies: Ct Chest Wo Contrast  Result Date: 04/19/2017 CLINICAL DATA:  66 year old female with history of right-sided pneumothorax. EXAM: CT CHEST WITHOUT CONTRAST TECHNIQUE: Multidetector CT imaging of the chest was performed following the standard protocol without IV contrast. COMPARISON:  Chest CTA 05/28/2016. FINDINGS: Cardiovascular: Heart size is normal. There is no significant pericardial fluid, thickening or pericardial calcification. Aortic atherosclerosis. No definite coronary artery calcifications. Dilatation of the pulmonic trunk (3.7 cm in diameter). Mediastinum/Nodes: Multiple enlarged mediastinal and hilar lymph nodes are again noted, measuring up to 1.4 cm in short axis in the prevascular nodal station and 1.9 cm in short axis in the low right paratracheal nodal station. Esophagus is unremarkable in appearance. No axillary lymphadenopathy. Lungs/Pleura: Widespread areas of traction bronchiectasis and honeycombing are noted, most evident throughout the mid to lower lungs bilaterally, but also present in the posterior aspect of the upper lobes of the lungs bilaterally. These findings have clearly progressed compared to prior study 06/06/2016. No acute consolidative airspace disease. No pleural effusions. Small right-sided pneumothorax, predominantly in the apex of the right hemithorax. Right-sided chest tube in position with pigtail reformed inside the right hemithorax up against the chest wall. Some apical bullous changes are noted bilaterally (right greater than left). Mild centrilobular and paraseptal emphysema is noted. Upper Abdomen: Suture  line near the gastroesophageal junction. Status post cholecystectomy. Musculoskeletal: Extensive subcutaneous emphysema throughout the right chest wall tracking caudally into the right flank subcutaneous fat and musculature, and tracking cephalad into the lower right cervical region. There are no aggressive appearing lytic or blastic lesions noted in the visualized portions of the skeleton. IMPRESSION: 1. Right-sided chest tube appears appropriately positioned. There is a very small residual right-sided pneumothorax. 2. The appearance of lungs is unusual. Overall, the spectrum of fibrotic changes is favored to reflect progressive usual interstitial pneumonia (UIP). The unusual distribution of additional fibrotic areas in the mid to upper lungs is presumably reflective of underlying sarcoidosis as well. 3. Mild diffuse bronchial wall thickening with mild centrilobular and paraseptal emphysema. 4. Aortic atherosclerosis. 5. Dilatation of the pulmonic trunk (3.7 cm in diameter), concerning for pulmonary arterial hypertension. Aortic Atherosclerosis (ICD10-I70.0) and Emphysema (ICD10-J43.9). Electronically Signed   By: Trudie Reed M.D.   On: 04/19/2017 21:27   Dg Chest Port 1 View  Result Date: 04/20/2017 CLINICAL DATA:  Sarcoidosis, asthma,  chronic respiratory failure with hypoxia, right-sided pneumothorax with chest tube treatment. EXAM: PORTABLE CHEST 1 VIEW COMPARISON:  Chest x-ray as well as chest CT scan of April 19, 2017 FINDINGS: There remains an approximately 20% right-sided pneumothorax. The small caliber chest tube is in stable position projecting over the lateral aspect of the mid ribcage. There is considerable subcutaneous emphysema on the right which is stable. There is no significant mediastinal shift. The interstitial markings of both lungs remain increased. The heart is top-normal in size. There is a hiatal hernia. IMPRESSION: Fairly stable appearance of the approximately 20% right-sided  pneumothorax. The small caliber right-sided chest tube is in stable position but lies inferior to the pneumothorax. Stable subcutaneous emphysema on the right. Stable chronic interstitial changes bilaterally most compatible with the history of reactive airway disease and/or sarcoidosis. Electronically Signed   By: David  [place] M.D.   On: 04/20/2017 07:39   Dg Chest Port 1 View  Result Date: 04/19/2017 CLINICAL DATA:  RIGHT-sided pneumothorax, chest tube, history sarcoidosis EXAM: PORTABLE CHEST 1 VIEW COMPARISON:  Portable exam 1403 hours compared to 04/18/2017 FINDINGS: Pigtail RIGHT thoracostomy tube again noted. Slight increase in size of RIGHT pneumothorax. No mediastinal shift. Diffuse pulmonary infiltrates with a predominance in the mid to lower lungs question pulmonary edema superimposed upon a background of pulmonary fibrosis, increased versus 06/27/2016. No gross pleural effusion. Bones demineralized. IMPRESSION: Slight increase in size of RIGHT pneumothorax despite thoracostomy tube. Pulmonary fibrosis with suspect mild superimposed pulmonary edema. Electronically Signed   By: Ulyses Southward M.D.   On: 04/19/2017 15:24   Dg Chest Port 1 View  Result Date: 04/19/2017 CLINICAL DATA:  Right-sided chest tube repositioning. Initial encounter. EXAM: PORTABLE CHEST 1 VIEW COMPARISON:  Chest radiograph performed earlier today at 10:22 p.m. FINDINGS: There is a persistent small right apical pneumothorax, stable from the prior study. The right-sided chest tube has been advanced. Patchy bibasilar airspace opacities raise concern for pulmonary edema. Mild vascular congestion is noted. No definite pleural effusion is seen. The cardiomediastinal silhouette is borderline normal in size. No acute osseous abnormalities are identified. Soft tissue air is noted along the right chest wall. IMPRESSION: 1. Persistent small right apical pneumothorax is stable from the recent prior study. Right-sided chest tube has been  advanced. 2. Patchy bilateral airspace opacities raise concern for pulmonary edema. Mild vascular congestion noted. Electronically Signed   By: Roanna Raider M.D.   On: 04/19/2017 00:24   Dg Chest Port 1 View  Result Date: 04/18/2017 CLINICAL DATA:  66 y/o  F; pneumothorax.  History of sarcoidosis. EXAM: PORTABLE CHEST 1 VIEW COMPARISON:  04/18/2017 chest radiograph FINDINGS: Stable right-sided pneumothorax. No leftward mediastinal shift. Stable position of right-sided pigtail catheter partially in right chest wall and partially pleural space. Interval increase in pneumatosis within the right chest wall. Stable diffuse coarse reticular opacities of the lungs. Stable cardiac silhouette. IMPRESSION: 1. Stable right pneumothorax. Stable position of right pigtail catheter partially in the right chest wall and partially in pleural space. Interval increase in right chest wall pneumatosis. 2. Stable coarse reticular opacities compatible with chronic interstitial lung disease. Electronically Signed   By: Mitzi Hansen M.D.   On: 04/18/2017 22:41   Dg Chest Port 1 View  Result Date: 04/18/2017 CLINICAL DATA:  Status post chest tube placement. History of sarcoidosis. EXAM: PORTABLE CHEST 1 VIEW COMPARISON:  Earlier today. FINDINGS: Interval right lateral pigtail chest catheter with some side holes projected within the lateral right hemithorax and some projected  outside of the hemithorax. Significant decrease in size of the previously demonstrated large right pneumothorax. The residual pneumothorax occupies approximately 10% of the volume of the right hemithorax. No significant change in diffuse prominence of the interstitial markings. Normal sized heart. Unremarkable bones. IMPRESSION: 1. Interval right lateral pigtail chest catheter with some side holes in the hemithorax and some side holes outside of the hemithorax. 2. Significant decrease in size of the right pneumothorax with a residual approximately  10% pneumothorax. 3. Stable chronic interstitial lung disease compatible with the history of sarcoidosis. Electronically Signed   By: Beckie Salts M.D.   On: 04/18/2017 19:01   Dg Chest Port 1 View  Result Date: 04/18/2017 CLINICAL DATA:  Acute shortness of breath and diminished breath sounds on RIGHT, history sarcoidosis, GERD, asthma EXAM: PORTABLE CHEST 1 VIEW COMPARISON:  Portable exam 1735 hours compared to 06/27/2016 FINDINGS: Normal heart size and mediastinal contours. Large RIGHT pneumothorax with complete collapse of the RIGHT lung. Underlying chronic interstitial lung disease/pulmonary fibrosis. No mediastinal shift. Bones demineralized. IMPRESSION: Large RIGHT pneumothorax with complete collapse of the RIGHT lung without evidence of mediastinal shift/tension. Underlying chronic interstitial lung disease/ pulmonary fibrosis. Critical Value/emergent results were called by telephone at the time of interpretation on 04/18/2017 at 5:52 pm to [facility], who verbally acknowledged these results. Electronically Signed   By: Ulyses Southward M.D.   On: 04/18/2017 17:53      Scheduled Meds: . arformoterol  15 mcg Nebulization BID  . budesonide (PULMICORT) nebulizer solution  0.5 mg Nebulization BID  . buPROPion  300 mg Oral Daily  . chlorpheniramine-HYDROcodone  5 mL Oral Q12H  . cycloSPORINE  1 drop Both Eyes BID  . enoxaparin (LOVENOX) injection  40 mg Subcutaneous Q24H  . famotidine  20 mg Oral BID  . fluticasone  1 spray Each Nare TID  . ipratropium-albuterol  3 mL Nebulization QID  . lidocaine  1 patch Transdermal Q24H  . loratadine  10 mg Oral Daily  . multivitamin with minerals  1 tablet Oral Daily  . pantoprazole  20 mg Oral Daily   Continuous Infusions:    LOS: 2 days    Time spent in minutes: 35    Calvert Cantor, MD [facility] Pager: [email or web address] Password [facility] 04/20/2017, 11:17 AM

## 2017-04-20 NOTE — Progress Notes (Signed)
R Chest Tube flushed (without difficulty) w/30cc NS per MD orders.

## 2017-04-20 NOTE — Consult Note (Signed)
[facility] Primary Care Navigator  04/20/2017  DENEISHA DADE [birth year] [id number]    Met with patient at the bedside to identify possible discharge needs. Patient reports having right upper chest pain, being out of breath and "low oxygen reading" that had led to this admission.  Patient endorses Dr. Janett Billow Copland with [employer] at [facility] as the primary care provider.   Patient shared using [facility] on [address] to obtain medications without any problem.   Patient reports managing own medications at home using her own "organizer system" and alarm set-up to remind her when to take medications.   Patient was driving prior to admission. Their family friend Eduard Clos) can provide transportation to her doctors' appointments if needed after discharge (since daughter and son are living out of state and husband travels).  Patient reports that husband will take days off from work to provide her assistance after discharge. She can also call other family members to come over and assist if needed per patient.  Discharge plan is home according to patient.  Patient voiced understanding to call primary care provider's office for a post discharge follow-up appointment within a week or sooner if needs arise. Patient letter (with PCP's contact number) was provided as a reminder.            Patient denies any current health management needs or concerns at this time.  She declined EMMI calls offeredforfollow-up when she gets home.   [facility] care management information provided for future needs that may arise.  For questions, please contact:  Dannielle Huh, BSN, RN- [facility] Primary Care Navigator  Telephone: [phone] [place]

## 2017-04-21 ENCOUNTER — Inpatient Hospital Stay (HOSPITAL_COMMUNITY): Payer: Medicare Other

## 2017-04-21 LAB — PATHOLOGIST SMEAR REVIEW

## 2017-04-21 MED ORDER — LEVALBUTEROL HCL 0.63 MG/3ML IN NEBU
0.6300 mg | INHALATION_SOLUTION | Freq: Three times a day (TID) | RESPIRATORY_TRACT | Status: DC
  Administered 2017-04-21 – 2017-04-24 (×6): 0.63 mg via RESPIRATORY_TRACT
  Filled 2017-04-21 (×7): qty 3

## 2017-04-21 MED ORDER — LEVALBUTEROL HCL 0.63 MG/3ML IN NEBU: 0.6300 mg | INHALATION_SOLUTION | Freq: Four times a day (QID) | RESPIRATORY_TRACT | Status: DC | PRN

## 2017-04-21 MED ORDER — IPRATROPIUM BROMIDE 0.02 % IN SOLN
0.5000 mg | Freq: Three times a day (TID) | RESPIRATORY_TRACT | Status: DC
  Administered 2017-04-21 – 2017-04-24 (×6): 0.5 mg via RESPIRATORY_TRACT
  Filled 2017-04-21 (×7): qty 2.5

## 2017-04-21 NOTE — Progress Notes (Signed)
Received report with pt subjective symptoms of feeling chills, low grade fever, warmth, swelling, and pain at the chest tube site. Upon personal assessment and talking to the patient, she verbalized that her normal temp is "always" around 97 and that she was told this evening her T. 99. Also reportsr pain at  R groin associated with coughing, not something acute. Repeated T. 98.4, orally, BP 119/81, P 99 R 19 O2 sat 97 on 3L, [place]. No obvious swelling nor unusual warmth felt at the chest tube site. Involved Charge Nurse to assist with assessment and findings. No acute issues found to call in for pulmonologist evaluation Given analgesic to relief pain and to continue monitor.

## 2017-04-21 NOTE — Progress Notes (Signed)
PROGRESS NOTE    [name]   ZOX:[id number]  DOB: Jan 17, 1951  DOA: 04/18/2017 PCP: Pearline Cablesopland, Jessica C, MD   Brief Narrative:   [name] is a 66 y.o. female with medical history significant of pulmonary sarcoid.  Just tapered off of prednisone completely on July 15th.  Followed by Dr. Vassie LollAlva.  Patient presents to the ED with sudden onset of R sided CP and SOB.  EMS put her on CPAP, given Albuterol and Atrovent, hypoxic in 80s, CXR with large right sided pneumothorax.   Subjective: No complaints today ROS: no complaints of nausea, vomiting, constipation diarrhea, cough, dyspnea or dysuria. No other complaints.   Assessment & Plan:   Principal Problem:   Pneumothorax on right - cont Chest tube per pulmonary- they will consult CVTS today due to air leak  Active Problems:   PULMONARY SARCOIDOSIS/  Chronic respiratory failure with hypoxia   - Nebs per pulmonary   Obesity Body mass index is 37.02 kg/m.   DVT prophylaxis: lovenox Code Status: full code Family Communication:  Disposition Plan: home when stable Consultants:   pulmonary Procedures:   Chest tube Antimicrobials:  Anti-infectives    None       Objective: Vitals:   04/20/17 2353 04/21/17 0409 04/21/17 0926 04/21/17 1339  BP: 108/77 100/68 104/84 107/74  Pulse: (!) 114 94  (!) 102  Resp: (!) 26 18  18   Temp: 97.9 F (36.6 C) 98.3 F (36.8 C) 98.8 F (37.1 C) 98.4 F (36.9 C)  TempSrc: Oral Oral Oral Oral  SpO2: 94% 100% 95% 96%  Weight:  100.9 kg (222 lb 7.1 oz)    Height:        Intake/Output Summary (Last 24 hours) at 04/21/17 1542 Last data filed at 04/21/17 1500  Gross per 24 hour  Intake              240 ml  Output              110 ml  Net              130 ml   Filed Weights   04/19/17 0533 04/20/17 0453 04/21/17 0409  Weight: 100.9 kg (222 lb 8 oz) 101.4 kg (223 lb 9.6 oz) 100.9 kg (222 lb 7.1 oz)    Examination: General exam: Appears comfortable  HEENT: PERRLA, oral  mucosa moist, no sclera icterus or thrush Respiratory system: Clear to auscultation. Respiratory effort normal. Cardiovascular system: S1 & S2 heard,  No murmurs  Gastrointestinal system: Abdomen soft, non-tender, nondistended. Normal bowel sound. No organomegaly Central nervous system: Alert and oriented. No focal neurological deficits. Extremities: No cyanosis, clubbing or edema Skin: No rashes or ulcers Psychiatry:  Mood & affect appropriate.      Data Reviewed: I have personally reviewed following labs and imaging studies  CBC:  Recent Labs Lab 04/18/17 1730 04/19/17 0241  WBC 9.4 9.9  NEUTROABS 1.1*  --   HGB 12.7 11.9*  HCT 39.0 36.5  MCV 92.9 91.5  PLT 258 358   Basic Metabolic Panel:  Recent Labs Lab 04/18/17 1730 04/19/17 0241  NA 137 137  K 3.9 4.7  CL 103 103  CO2 23 25  GLUCOSE 219* 157*  BUN 13 13  CREATININE 1.31* 1.12*  CALCIUM 9.2 9.3   GFR: Estimated Creatinine Clearance: 56.6 mL/min (A) (by C-G formula based on SCr of 1.12 mg/dL (H)). Liver Function Tests:  Recent Labs Lab 04/18/17 1730  AST 39  ALT  27  ALKPHOS 80  BILITOT 0.4  PROT 8.3*  ALBUMIN 3.8   No results for input(s): LIPASE, AMYLASE in the last 168 hours. No results for input(s): AMMONIA in the last 168 hours. Coagulation Profile: No results for input(s): INR, PROTIME in the last 168 hours. Cardiac Enzymes: No results for input(s): CKTOTAL, CKMB, CKMBINDEX, TROPONINI in the last 168 hours. BNP (last 3 results) No results for input(s): PROBNP in the last 8760 hours. HbA1C: No results for input(s): HGBA1C in the last 72 hours. CBG: No results for input(s): GLUCAP in the last 168 hours. Lipid Profile: No results for input(s): CHOL, HDL, LDLCALC, TRIG, CHOLHDL, LDLDIRECT in the last 72 hours. Thyroid Function Tests: No results for input(s): TSH, T4TOTAL, FREET4, T3FREE, THYROIDAB in the last 72 hours. Anemia Panel: No results for input(s): VITAMINB12, FOLATE, FERRITIN,  TIBC, IRON, RETICCTPCT in the last 72 hours. Urine analysis:    Component Value Date/Time   BILIRUBINUR Negaitve 03/20/2016 1158   PROTEINUR negative 03/20/2016 1158   UROBILINOGEN negative 03/20/2016 1158   NITRITE negative 03/20/2016 1158   LEUKOCYTESUR Negative 03/20/2016 1158   Sepsis Labs: @LABRCNTIP (procalcitonin:4,lacticidven:4) )No results found for this or any previous visit (from the past 240 hour(s)).       Radiology Studies: Ct Chest Wo Contrast  Result Date: 04/19/2017 CLINICAL DATA:  65 year old female with history of right-sided pneumothorax. EXAM: CT CHEST WITHOUT CONTRAST TECHNIQUE: Multidetector CT imaging of the chest was performed following the standard protocol without IV contrast. COMPARISON:  Chest CTA 05/28/2016. FINDINGS: Cardiovascular: Heart size is normal. There is no significant pericardial fluid, thickening or pericardial calcification. Aortic atherosclerosis. No definite coronary artery calcifications. Dilatation of the pulmonic trunk (3.7 cm in diameter). Mediastinum/Nodes: Multiple enlarged mediastinal and hilar lymph nodes are again noted, measuring up to 1.4 cm in short axis in the prevascular nodal station and 1.9 cm in short axis in the low right paratracheal nodal station. Esophagus is unremarkable in appearance. No axillary lymphadenopathy. Lungs/Pleura: Widespread areas of traction bronchiectasis and honeycombing are noted, most evident throughout the mid to lower lungs bilaterally, but also present in the posterior aspect of the upper lobes of the lungs bilaterally. These findings have clearly progressed compared to prior study 06/06/2016. No acute consolidative airspace disease. No pleural effusions. Small right-sided pneumothorax, predominantly in the apex of the right hemithorax. Right-sided chest tube in position with pigtail reformed inside the right hemithorax up against the chest wall. Some apical bullous changes are noted bilaterally (right  greater than left). Mild centrilobular and paraseptal emphysema is noted. Upper Abdomen: Suture line near the gastroesophageal junction. Status post cholecystectomy. Musculoskeletal: Extensive subcutaneous emphysema throughout the right chest wall tracking caudally into the right flank subcutaneous fat and musculature, and tracking cephalad into the lower right cervical region. There are no aggressive appearing lytic or blastic lesions noted in the visualized portions of the skeleton. IMPRESSION: 1. Right-sided chest tube appears appropriately positioned. There is a very small residual right-sided pneumothorax. 2. The appearance of lungs is unusual. Overall, the spectrum of fibrotic changes is favored to reflect progressive usual interstitial pneumonia (UIP). The unusual distribution of additional fibrotic areas in the mid to upper lungs is presumably reflective of underlying sarcoidosis as well. 3. Mild diffuse bronchial wall thickening with mild centrilobular and paraseptal emphysema. 4. Aortic atherosclerosis. 5. Dilatation of the pulmonic trunk (3.7 cm in diameter), concerning for pulmonary arterial hypertension. Aortic Atherosclerosis (ICD10-I70.0) and Emphysema (ICD10-J43.9). Electronically Signed   By: Trudie Reed M.D.   On:  04/19/2017 21:27   Dg Chest Port 1 View  Result Date: 04/21/2017 CLINICAL DATA:  Right pneumothorax. EXAM: PORTABLE CHEST 1 VIEW COMPARISON:  Radiographs dated 04/20/2017 and 04/19/2017 and CT scan dated 04/19/2017 FINDINGS: Right chest tube remains unchanged in position. Right subcutaneous emphysema has slightly increased since the prior study. Right apical pneumothorax has diminished slightly in size. Blebs are seen at the right lung apex. The patient has severe chronic interstitial lung disease with bronchiectasis, as demonstrated on the prior CT scan. No acute abnormality in the left lung. Improved aeration at the the lung bases. Overall heart size and vascularity are normal.  IMPRESSION: 1. Slight decrease in the right apical pneumothorax. 2. Increased subcutaneous emphysema on the right. 3. No change in the position of the right-sided pigtail chest tube. 4. Improved aeration of both lung bases. Electronically Signed   By: Francene Boyers M.D.   On: 04/21/2017 08:50   Dg Chest Port 1 View  Result Date: 04/20/2017 CLINICAL DATA:  Sarcoidosis, asthma, chronic respiratory failure with hypoxia, right-sided pneumothorax with chest tube treatment. EXAM: PORTABLE CHEST 1 VIEW COMPARISON:  Chest x-ray as well as chest CT scan of April 19, 2017 FINDINGS: There remains an approximately 20% right-sided pneumothorax. The small caliber chest tube is in stable position projecting over the lateral aspect of the mid ribcage. There is considerable subcutaneous emphysema on the right which is stable. There is no significant mediastinal shift. The interstitial markings of both lungs remain increased. The heart is top-normal in size. There is a hiatal hernia. IMPRESSION: Fairly stable appearance of the approximately 20% right-sided pneumothorax. The small caliber right-sided chest tube is in stable position but lies inferior to the pneumothorax. Stable subcutaneous emphysema on the right. Stable chronic interstitial changes bilaterally most compatible with the history of reactive airway disease and/or sarcoidosis. Electronically Signed   By: David  [place] M.D.   On: 04/20/2017 07:39      Scheduled Meds: . arformoterol  15 mcg Nebulization BID  . budesonide (PULMICORT) nebulizer solution  0.5 mg Nebulization BID  . buPROPion  300 mg Oral Daily  . chlorpheniramine-HYDROcodone  5 mL Oral Q12H  . cycloSPORINE  1 drop Both Eyes BID  . enoxaparin (LOVENOX) injection  40 mg Subcutaneous Q24H  . famotidine  20 mg Oral BID  . fluticasone  1 spray Each Nare TID  . ipratropium  0.5 mg Nebulization TID  . levalbuterol  0.63 mg Nebulization TID  . lidocaine  1 patch Transdermal Q24H  . loratadine   10 mg Oral Daily  . multivitamin with minerals  1 tablet Oral Daily  . pantoprazole  20 mg Oral Daily   Continuous Infusions:    LOS: 3 days    Time spent in minutes: 35    Calvert Cantor, MD [facility] Pager: [email or web address] Password [facility] 04/21/2017, 3:42 PM

## 2017-04-21 NOTE — Progress Notes (Signed)
Patient's HR was over 100 before albuterol. Was getting duonebl QID. Changed to Xopenex and Atrovent TID and xopenex q6 PRN to see if that helps

## 2017-04-21 NOTE — Progress Notes (Addendum)
Patient verbalizes pain coming from chest tube site/side, upon assessment warmth and swelling in that area, with chills. Temp was 99. Paged Rizwan. Bettey Costaachel Alois Mincer, RN    Addendum:   Sherron MondaySpoke with MD Shorr, order placed for WBC in the morning. Reported to oncoming nurse to contact pulmonology. Bettey Costaachel Karesa Maultsby, RN

## 2017-04-21 NOTE — Progress Notes (Signed)
Name: [name] MRN: [id number] DOB: 17-Apr-1951    ADMISSION DATE:  04/18/2017 CONSULTATION DATE: 04/18/17  REFERRING MD :  Dr. Julian Reil   CHIEF COMPLAINT:  Pneumothorax   BRIEF PATIENT DESCRIPTION: 66yoF with Pulmonary sarcoidosis, Asthma, Chronic Hypoxia (on 2L O2), GERD, Anxiety, Migraines, Grade 1 Diastolic CHF, Allergic rhinitis, and Obesity who presented to ER on 8/11 with acute onset SOB, CP, and hypoxia, and was found to have a spontaneous right-sided pneumothorax. She is now s/p pigtail chest tube to suction.   SUBJECTIVE:  Patient feels much improved since Saturday.   Cough better, minimal soreness around CT site  VITAL SIGNS: Temp:  [97.9 F (36.6 C)-98.8 F (37.1 C)] 98.3 F (36.8 C) (08/14 0409) Pulse Rate:  [94-114] 94 (08/14 0409) Resp:  [18-26] 18 (08/14 0409) BP: (100-117)/(68-91) 104/84 (08/14 0926) SpO2:  [94 %-100 %] 95 % (08/14 0926) Weight:  [222 lb 7.1 oz (100.9 kg)] 222 lb 7.1 oz (100.9 kg) (08/14 0409)  PHYSICAL EXAMINATION: General:  Pleasant adult female sitting in bed in NAD Neuro:  AOx3, MAE, non focal HEENT:  Poor dentition, moist mucosa  Cardiovascular:  RRR , No murmur Lungs:  Even, non-labored, insp crackles anterior, right pigtail CT to 40 cm suction with 2/7 airleak, 99% on 3L [place] Abdomen:  Soft /NT, BS +  Musculoskeletal:  Intact  Skin: no rash, w/d   Recent Labs Lab 04/18/17 1730 04/19/17 0241  NA 137 137  K 3.9 4.7  CL 103 103  CO2 23 25  BUN 13 13  CREATININE 1.31* 1.12*  GLUCOSE 219* 157*    Recent Labs Lab 04/18/17 1730 04/19/17 0241  HGB 12.7 11.9*  HCT 39.0 36.5  WBC 9.4 9.9  PLT 258 358   Ct Chest Wo Contrast  Result Date: 04/19/2017 CLINICAL DATA:  66 year old female with history of right-sided pneumothorax. EXAM: CT CHEST WITHOUT CONTRAST TECHNIQUE: Multidetector CT imaging of the chest was performed following the standard protocol without IV contrast. COMPARISON:  Chest CTA 05/28/2016. FINDINGS:  Cardiovascular: Heart size is normal. There is no significant pericardial fluid, thickening or pericardial calcification. Aortic atherosclerosis. No definite coronary artery calcifications. Dilatation of the pulmonic trunk (3.7 cm in diameter). Mediastinum/Nodes: Multiple enlarged mediastinal and hilar lymph nodes are again noted, measuring up to 1.4 cm in short axis in the prevascular nodal station and 1.9 cm in short axis in the low right paratracheal nodal station. Esophagus is unremarkable in appearance. No axillary lymphadenopathy. Lungs/Pleura: Widespread areas of traction bronchiectasis and honeycombing are noted, most evident throughout the mid to lower lungs bilaterally, but also present in the posterior aspect of the upper lobes of the lungs bilaterally. These findings have clearly progressed compared to prior study 06/06/2016. No acute consolidative airspace disease. No pleural effusions. Small right-sided pneumothorax, predominantly in the apex of the right hemithorax. Right-sided chest tube in position with pigtail reformed inside the right hemithorax up against the chest wall. Some apical bullous changes are noted bilaterally (right greater than left). Mild centrilobular and paraseptal emphysema is noted. Upper Abdomen: Suture line near the gastroesophageal junction. Status post cholecystectomy. Musculoskeletal: Extensive subcutaneous emphysema throughout the right chest wall tracking caudally into the right flank subcutaneous fat and musculature, and tracking cephalad into the lower right cervical region. There are no aggressive appearing lytic or blastic lesions noted in the visualized portions of the skeleton. IMPRESSION: 1. Right-sided chest tube appears appropriately positioned. There is a very small residual right-sided pneumothorax. 2. The appearance of lungs is  unusual. Overall, the spectrum of fibrotic changes is favored to reflect progressive usual interstitial pneumonia (UIP). The unusual  distribution of additional fibrotic areas in the mid to upper lungs is presumably reflective of underlying sarcoidosis as well. 3. Mild diffuse bronchial wall thickening with mild centrilobular and paraseptal emphysema. 4. Aortic atherosclerosis. 5. Dilatation of the pulmonic trunk (3.7 cm in diameter), concerning for pulmonary arterial hypertension. Aortic Atherosclerosis (ICD10-I70.0) and Emphysema (ICD10-J43.9). Electronically Signed   By: Trudie Reed M.D.   On: 04/19/2017 21:27   Dg Chest Port 1 View  Result Date: 04/21/2017 CLINICAL DATA:  Right pneumothorax. EXAM: PORTABLE CHEST 1 VIEW COMPARISON:  Radiographs dated 04/20/2017 and 04/19/2017 and CT scan dated 04/19/2017 FINDINGS: Right chest tube remains unchanged in position. Right subcutaneous emphysema has slightly increased since the prior study. Right apical pneumothorax has diminished slightly in size. Blebs are seen at the right lung apex. The patient has severe chronic interstitial lung disease with bronchiectasis, as demonstrated on the prior CT scan. No acute abnormality in the left lung. Improved aeration at the the lung bases. Overall heart size and vascularity are normal. IMPRESSION: 1. Slight decrease in the right apical pneumothorax. 2. Increased subcutaneous emphysema on the right. 3. No change in the position of the right-sided pigtail chest tube. 4. Improved aeration of both lung bases. Electronically Signed   By: Francene Boyers M.D.   On: 04/21/2017 08:50   Dg Chest Port 1 View  Result Date: 04/20/2017 CLINICAL DATA:  Sarcoidosis, asthma, chronic respiratory failure with hypoxia, right-sided pneumothorax with chest tube treatment. EXAM: PORTABLE CHEST 1 VIEW COMPARISON:  Chest x-ray as well as chest CT scan of April 19, 2017 FINDINGS: There remains an approximately 20% right-sided pneumothorax. The small caliber chest tube is in stable position projecting over the lateral aspect of the mid ribcage. There is considerable  subcutaneous emphysema on the right which is stable. There is no significant mediastinal shift. The interstitial markings of both lungs remain increased. The heart is top-normal in size. There is a hiatal hernia. IMPRESSION: Fairly stable appearance of the approximately 20% right-sided pneumothorax. The small caliber right-sided chest tube is in stable position but lies inferior to the pneumothorax. Stable subcutaneous emphysema on the right. Stable chronic interstitial changes bilaterally most compatible with the history of reactive airway disease and/or sarcoidosis. Electronically Signed   By: David  [place] M.D.   On: 04/20/2017 07:39   Dg Chest Port 1 View  Result Date: 04/19/2017 CLINICAL DATA:  RIGHT-sided pneumothorax, chest tube, history sarcoidosis EXAM: PORTABLE CHEST 1 VIEW COMPARISON:  Portable exam 1403 hours compared to 04/18/2017 FINDINGS: Pigtail RIGHT thoracostomy tube again noted. Slight increase in size of RIGHT pneumothorax. No mediastinal shift. Diffuse pulmonary infiltrates with a predominance in the mid to lower lungs question pulmonary edema superimposed upon a background of pulmonary fibrosis, increased versus 06/27/2016. No gross pleural effusion. Bones demineralized. IMPRESSION: Slight increase in size of RIGHT pneumothorax despite thoracostomy tube. Pulmonary fibrosis with suspect mild superimposed pulmonary edema. Electronically Signed   By: Ulyses Southward M.D.   On: 04/19/2017 15:24    STUDIES:  PORT CXR 8/12:  Questionable lateral placement of percutaneous chest tube. Increased size of apical right pneumothorax. CT chest 8/12:  Right-sided chest tube appears appropriately positioned w/very small residual right-sided pneumothorax; fibrotic changes is favored to reflect progressive usual interstitial pneumonia (UIP). The unusual distribution of additional fibrotic areas in the mid to upper lungs is presumably reflective of underlying sarcoidosis as well;  Mild diffuse  bronchial wall  thickening with mild centrilobular and paraseptal emphysema;  Aortic atherosclerosis; Dilatation of the pulmonic trunk (3.7 cm in diameter), concerning for pulmonary arterial hypertension. PORT CXR 8/13:  Stable appearance of approx 20% right sided pneumothorax; right CT om stable position but lies inferior to the pneumothorax; stable subcutaneous emphysema on the right.   PORT CXR 8/14: slight decrease in right apical pnx, increases SQ emphysema on right, no change in CT position, blebs seen at right lung apex  ASSESSMENT/PLAN: 66 y.o. female with underlying sarcoidosis and spontaneous right-sided pneumothorax. This is her first pneumothorax. Reaccumulation of air on the right likely secondary to the fact that her chest tube was to intermittent suction. Respiratory status at this time is stable. Questionable tube placement on x-ray imaging.  1. Spontaneous right pneumothorax:  ?likely due to bleb rupture.  Continue CT to 40cm suction, air leak continues, better lung re-expansion today.  Cough improved with tessalon.  Will consult Thoracic surgery.   2. Pulmonary sarcoidosis: No symptoms to suggest sarcoid flare.  CT chest show progression of disease since 2017. Patient continuing on nebulized twice daily Pulmicort and Brovana as well as 4 times a day DuoNeb.  3. Chronic hypoxic respiratory failure: Continuing oxygen therapy for goal saturation greater than 92%. 4. GERD: continue protonix and pepcid 5. Environmental allergies: Flonase and Claritin continued.     Posey BoyerBrooke Simpson, AGACNP-BC [facility] Pgr: [phone] or if no answer [phone] 04/21/2017, 10:24 AM  Attending Note:  I have examined patient, reviewed labs, studies and notes. I have discussed the case with B Simpson, and I agree with the data and plans as amended above. 66 yo woman w sarcoidosis and associated ILD, now with persistent R PTX. She has a pigtail in place, changed to -40 cm H2O on 8/13. Her CXR today  shows some improvement but no resolution of her apical PTX. She does feel a bit better, less SOB. The pigtail appears to be working, has resp variation, has a 2/7 air leak. At this point would involve TCTS to assess for possible larger bore chest tube vs surgical intervention to address suspected BPF.    Levy Pupaobert Nicholus Chandran, MD, PhD 04/21/2017, 2:03 PM [facility] [phone] or if no answer [phone]

## 2017-04-21 NOTE — Plan of Care (Signed)
Problem: Activity: Goal: Risk for activity intolerance will decrease Outcome: Progressing Pt becomes short of breath with activity but transfers with 1 assist to chair and back.

## 2017-04-22 ENCOUNTER — Inpatient Hospital Stay (HOSPITAL_COMMUNITY): Payer: Medicare Other

## 2017-04-22 ENCOUNTER — Ambulatory Visit: Payer: Medicare Other | Admitting: Gastroenterology

## 2017-04-22 DIAGNOSIS — R0602 Shortness of breath: Secondary | ICD-10-CM

## 2017-04-22 DIAGNOSIS — J9311 Primary spontaneous pneumothorax: Secondary | ICD-10-CM

## 2017-04-22 DIAGNOSIS — F411 Generalized anxiety disorder: Secondary | ICD-10-CM

## 2017-04-22 DIAGNOSIS — F331 Major depressive disorder, recurrent, moderate: Secondary | ICD-10-CM

## 2017-04-22 DIAGNOSIS — R4182 Altered mental status, unspecified: Secondary | ICD-10-CM

## 2017-04-22 DIAGNOSIS — Z87891 Personal history of nicotine dependence: Secondary | ICD-10-CM

## 2017-04-22 MED ORDER — VANCOMYCIN HCL IN DEXTROSE 1-5 GM/200ML-% IV SOLN
1000.0000 mg | Freq: Once | INTRAVENOUS | Status: AC
  Administered 2017-04-23: 1000 mg via INTRAVENOUS
  Filled 2017-04-22 (×2): qty 200

## 2017-04-22 MED ORDER — LORAZEPAM 0.5 MG PO TABS
0.2500 mg | ORAL_TABLET | Freq: Four times a day (QID) | ORAL | Status: DC | PRN
  Administered 2017-04-29: 0.25 mg via ORAL
  Administered 2017-04-30 – 2017-05-01 (×2): 0.5 mg via ORAL
  Filled 2017-04-22 (×3): qty 1

## 2017-04-22 MED ORDER — PANTOPRAZOLE SODIUM 40 MG PO TBEC
40.0000 mg | DELAYED_RELEASE_TABLET | Freq: Every day | ORAL | Status: DC
  Administered 2017-04-22 – 2017-05-12 (×21): 40 mg via ORAL
  Filled 2017-04-22 (×21): qty 1

## 2017-04-22 NOTE — Progress Notes (Signed)
Name: Kiara [name] MRN: [id number] DOB: 12-Dec-1950    ADMISSION DATE:  04/18/2017 CONSULTATION DATE: 04/18/17  REFERRING MD :  Dr. Julian ReilGardner   CHIEF COMPLAINT:  Pneumothorax   BRIEF PATIENT DESCRIPTION: 66yoF with Pulmonary sarcoidosis, Asthma, Chronic Hypoxia (on 2L O2), GERD, Anxiety, Migraines, Grade 1 Diastolic CHF, Allergic rhinitis, and Obesity who presented to ER on 8/11 with acute onset SOB, CP, and hypoxia, and was found to have a spontaneous right-sided pneumothorax. She is now s/p pigtail chest tube to suction.   SUBJECTIVE:  Feels better.  Large leak remains on chest tube to -40cmH20 suction.   VITAL SIGNS: Temp:  [98.3 F (36.8 C)-99 F (37.2 C)] 98.4 F (36.9 C) (08/15 0752) Pulse Rate:  [91-113] 98 (08/15 0752) Resp:  [18-26] 19 (08/15 0752) BP: (102-119)/(65-81) 103/72 (08/15 0752) SpO2:  [93 %-99 %] 94 % (08/15 0752) FiO2 (%):  [3 %] 3 % (08/15 0752)  PHYSICAL EXAMINATION: General:  Pleasant adult female sitting in bed in NAD Neuro:  AOx3, non-focal HEENT:  [employer]/AT, PERRL, no JVD Cardiovascular:  RRR , No murmur Lungs: Even, unlabored. Rales.  Abdomen:  Soft /NT, BS +  Musculoskeletal:  Intact  Skin: no rash, w/d   Recent Labs Lab 04/18/17 1730 04/19/17 0241  NA 137 137  K 3.9 4.7  CL 103 103  CO2 23 25  BUN 13 13  CREATININE 1.31* 1.12*  GLUCOSE 219* 157*    Recent Labs Lab 04/18/17 1730 04/19/17 0241  HGB 12.7 11.9*  HCT 39.0 36.5  WBC 9.4 9.9  PLT 258 358   Dg Chest Port 1 View  Result Date: 04/21/2017 CLINICAL DATA:  Right pneumothorax. EXAM: PORTABLE CHEST 1 VIEW COMPARISON:  Radiographs dated 04/20/2017 and 04/19/2017 and CT scan dated 04/19/2017 FINDINGS: Right chest tube remains unchanged in position. Right subcutaneous emphysema has slightly increased since the prior study. Right apical pneumothorax has diminished slightly in size. Blebs are seen at the right lung apex. The patient has severe chronic interstitial lung disease  with bronchiectasis, as demonstrated on the prior CT scan. No acute abnormality in the left lung. Improved aeration at the the lung bases. Overall heart size and vascularity are normal. IMPRESSION: 1. Slight decrease in the right apical pneumothorax. 2. Increased subcutaneous emphysema on the right. 3. No change in the position of the right-sided pigtail chest tube. 4. Improved aeration of both lung bases. Electronically Signed   By: Francene BoyersJames  Maxwell M.D.   On: 04/21/2017 08:50    STUDIES:  PORT CXR 8/12:  Questionable lateral placement of percutaneous chest tube. Increased size of apical right pneumothorax. CT chest 8/12:  Right-sided chest tube appears appropriately positioned w/very small residual right-sided pneumothorax; fibrotic changes is favored to reflect progressive usual interstitial pneumonia (UIP). The unusual distribution of additional fibrotic areas in the mid to upper lungs is presumably reflective of underlying sarcoidosis as well;  Mild diffuse bronchial wall thickening with mild centrilobular and paraseptal emphysema;  Aortic atherosclerosis; Dilatation of the pulmonic trunk (3.7 cm in diameter), concerning for pulmonary arterial hypertension. PORT CXR 8/13:  Stable appearance of approx 20% right sided pneumothorax; right CT om stable position but lies inferior to the pneumothorax; stable subcutaneous emphysema on the right.   PORT CXR 8/14: slight decrease in right apical pnx, increases SQ emphysema on right, no change in CT position, blebs seen at right lung apex  ASSESSMENT/PLAN: 66 y.o. female with underlying sarcoidosis and spontaneous right-sided pneumothorax. This is her first pneumothorax. Reaccumulation of  air on the right likely secondary to the fact that her chest tube was to intermittent suction. Respiratory status at this time is stable. Questionable tube placement on x-ray imaging.  Spontaneous right pneumothorax:  likely due to bleb rupture.  - Continue CT to 40cm  suction - AM CXR pending - CVTS to see  Pulmonary sarcoidosis with chronic hypoxemic respiratory failure: No symptoms to suggest sarcoid flare.  CT chest show progression of disease since 2017.  - Continue nebulized Pulmicort, Brovana, and DuoNeb.  - Supplemental O2 as needed to keep O2 sat > 92%  Cough: improved with tessalon.      GERD:  - protonix and pepcid  Environmental allergies:  - Flonase and Claritin continued.    Awaiting am CXR and CVTS eval.   Joneen Roach, AGACNP-BC [facility] Pulmonology/Critical Care Pager [phone] or [phone] 04/22/2017 10:58 AM    Attending Note:  I have examined patient, reviewed labs, studies and notes. I have discussed the case with Henreitta Leber, and I agree with the data and plans as amended above. 66 year old woman with history of sarcoidosis and associated interstitial lung disease, admitted with a right pneumothorax. She has a pigtail catheter in place that is still showing a PTX after 2 days on -40 cm H2O. Amount of a relationship lying comfortably in the bed. She has bilateral scattered crackles. Chest tube appears to be in good position and has a continued air leak. We have been working on cough suppression. She has had some upper airway irritation, notes that this is sensitive to GERD. I will add back her pantoprazole to her existing Pepcid. Planning for TCTS evaluation today.   Levy Pupa, MD, PhD 04/22/2017, 2:19 PM  Pulmonary and Critical Care [phone] or if no answer [phone]
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
## 2017-04-22 NOTE — Progress Notes (Signed)
Pt states her breathing is feeling better, placed on 4 lpm [place]- sat 100% currently.  NO distress currently noted.

## 2017-04-22 NOTE — Progress Notes (Signed)
RN called d/t pt desat 84-85% on 6 lpm [place].  HHN tx given- sat slowly improved to 95-96% on HHN.  Pt states she feels she breaths better if she sits up.  After HHN, pt placed on VM 55%, RN aware.  Sat 93-97% currently.  Paged CCM- waiting for return call.  RN aware.

## 2017-04-22 NOTE — Progress Notes (Signed)
PROGRESS NOTE    [name]   JYN:[id number]  DOB: 1951-03-04  DOA: 04/18/2017 PCP: Pearline Cables, MD   Brief Narrative:   [name] is a 66 y.o. female with medical history significant of pulmonary sarcoid.  Just tapered off of prednisone completely on July 15th.  Followed by Dr. Vassie Loll.  Patient presents to the ED with sudden onset of R sided CP and SOB.  EMS put her on CPAP, given Albuterol and Atrovent, hypoxic in 80s, CXR with large right sided pneumothorax.   Subjective:  Very anxious on 2 separate episodes today and could not get sats up Now doing better No chest pain No wheeze Verbalizing and full set  Assessment & Plan:   Principal Problem:   Pneumothorax on right - cont Chest tube per pulmonary CVTS input is pending regarding air leak We will place on cough medication and probably might need something for anxiety-has started 0.25-0.5 Ativan every 6 when necessary  Active Problems:   PULMONARY SARCOIDOSIS/  Chronic respiratory failure with hypoxia   - Nebs per pulmonary -   Obesity Body mass index is 37.02 kg/m.   DVT prophylaxis: lovenox Code Status: full code Family Communication:  Disposition Plan: home when stable Consultants:   pulmonary Procedures:   Chest tube Antimicrobials:  Anti-infectives    None       Objective: Vitals:   04/22/17 1336 04/22/17 1351 04/22/17 1633 04/22/17 1657  BP:    129/79  Pulse:   94 100  Resp:   16 20  Temp:      TempSrc:      SpO2: (S) (!) 86% 95% 99% 99%  Weight:      Height:        Intake/Output Summary (Last 24 hours) at 04/22/17 1827 Last data filed at 04/22/17 1741  Gross per 24 hour  Intake              720 ml  Output             1250 ml  Net             -530 ml   Filed Weights   04/19/17 0533 04/20/17 0453 04/21/17 0409  Weight: 100.9 kg (222 lb 8 oz) 101.4 kg (223 lb 9.6 oz) 100.9 kg (222 lb 7.1 oz)    Examination: General exam: Appears comfortable  HEENT: PERRLA,  oral mucosa moist, no sclera icterus or thrush Respiratory system: Clear to auscultation. Respiratory effort normal. Cardiovascular system: S1 & S2 heard,  No murmurs  Gastrointestinal system: Abdomen soft, non-tender, nondistended. Normal bowel sound. No organomegaly Central nervous system: Alert and oriented. No focal neurological deficits. Extremities: No cyanosis, clubbing or edema Skin: No rashes or ulcers Psychiatry:  Mood & affect appropriate.      Data Reviewed: I have personally reviewed following labs and imaging studies  CBC:  Recent Labs Lab 04/18/17 1730 04/19/17 0241  WBC 9.4 9.9  NEUTROABS 1.1*  --   HGB 12.7 11.9*  HCT 39.0 36.5  MCV 92.9 91.5  PLT 258 358   Basic Metabolic Panel:  Recent Labs Lab 04/18/17 1730 04/19/17 0241  NA 137 137  K 3.9 4.7  CL 103 103  CO2 23 25  GLUCOSE 219* 157*  BUN 13 13  CREATININE 1.31* 1.12*  CALCIUM 9.2 9.3   GFR: Estimated Creatinine Clearance: 56.6 mL/min (A) (by C-G formula based on SCr of 1.12 mg/dL (H)). Liver Function Tests:  Recent Labs Lab 04/18/17  1730  AST 39  ALT 27  ALKPHOS 80  BILITOT 0.4  PROT 8.3*  ALBUMIN 3.8   No results for input(s): LIPASE, AMYLASE in the last 168 hours. No results for input(s): AMMONIA in the last 168 hours. Coagulation Profile: No results for input(s): INR, PROTIME in the last 168 hours. Cardiac Enzymes: No results for input(s): CKTOTAL, CKMB, CKMBINDEX, TROPONINI in the last 168 hours. BNP (last 3 results) No results for input(s): PROBNP in the last 8760 hours. HbA1C: No results for input(s): HGBA1C in the last 72 hours. CBG: No results for input(s): GLUCAP in the last 168 hours. Lipid Profile: No results for input(s): CHOL, HDL, LDLCALC, TRIG, CHOLHDL, LDLDIRECT in the last 72 hours. Thyroid Function Tests: No results for input(s): TSH, T4TOTAL, FREET4, T3FREE, THYROIDAB in the last 72 hours. Anemia Panel: No results for input(s): VITAMINB12, FOLATE,  FERRITIN, TIBC, IRON, RETICCTPCT in the last 72 hours. Urine analysis:    Component Value Date/Time   BILIRUBINUR Negaitve 03/20/2016 1158   PROTEINUR negative 03/20/2016 1158   UROBILINOGEN negative 03/20/2016 1158   NITRITE negative 03/20/2016 1158   LEUKOCYTESUR Negative 03/20/2016 1158   Sepsis Labs: @LABRCNTIP (procalcitonin:4,lacticidven:4) )No results found for this or any previous visit (from the past 240 hour(s)).       Radiology Studies: Dg Chest Port 1 View  Result Date: 04/22/2017 CLINICAL DATA:  Chest tube.  Pneumothorax. EXAM: PORTABLE CHEST 1 VIEW COMPARISON:  Radiograph of April 21, 2017. FINDINGS: Stable cardiomediastinal silhouette. Stable mild right apical pneumothorax is noted. Stable large amount of subcutaneous emphysema is seen over right lateral chest wall. Stable position of right-sided pigtail drainage catheter. Stable interstitial densities are noted throughout both lungs, most prominently seen in the lung bases, consistent chronic interstitial lung disease. IMPRESSION: Stable position of right-sided pigtail drainage catheter. Stable mild right apical pneumothorax is noted. Stable subcutaneous emphysema is seen overlying right lateral chest wall. Electronically Signed   By: Lupita Raider, M.D.   On: 04/22/2017 12:05   Dg Chest Port 1 View  Result Date: 04/21/2017 CLINICAL DATA:  Right pneumothorax. EXAM: PORTABLE CHEST 1 VIEW COMPARISON:  Radiographs dated 04/20/2017 and 04/19/2017 and CT scan dated 04/19/2017 FINDINGS: Right chest tube remains unchanged in position. Right subcutaneous emphysema has slightly increased since the prior study. Right apical pneumothorax has diminished slightly in size. Blebs are seen at the right lung apex. The patient has severe chronic interstitial lung disease with bronchiectasis, as demonstrated on the prior CT scan. No acute abnormality in the left lung. Improved aeration at the the lung bases. Overall heart size and vascularity  are normal. IMPRESSION: 1. Slight decrease in the right apical pneumothorax. 2. Increased subcutaneous emphysema on the right. 3. No change in the position of the right-sided pigtail chest tube. 4. Improved aeration of both lung bases. Electronically Signed   By: Francene Boyers M.D.   On: 04/21/2017 08:50      Scheduled Meds: . arformoterol  15 mcg Nebulization BID  . budesonide (PULMICORT) nebulizer solution  0.5 mg Nebulization BID  . buPROPion  300 mg Oral Daily  . chlorpheniramine-HYDROcodone  5 mL Oral Q12H  . cycloSPORINE  1 drop Both Eyes BID  . enoxaparin (LOVENOX) injection  40 mg Subcutaneous Q24H  . famotidine  20 mg Oral BID  . fluticasone  1 spray Each Nare TID  . ipratropium  0.5 mg Nebulization TID  . levalbuterol  0.63 mg Nebulization TID  . lidocaine  1 patch Transdermal Q24H  .  loratadine  10 mg Oral Daily  . multivitamin with minerals  1 tablet Oral Daily  . pantoprazole  40 mg Oral Daily   Continuous Infusions:    LOS: 4 days    Time spent in minutes: 35    Rhetta MuraSAMTANI, JAI-GURMUKH, MD [facility] Pager: [email or web address] Password [email or web address] 04/22/2017, 6:27 PM

## 2017-04-22 NOTE — Progress Notes (Signed)
  Subjective: Patient examined, chest x-ray and CT scans of chest images personally reviewed and counseled with patient  Very nice 66 year old AA female with severe sarcoidosis on home oxygen. On August 11 she experienced her first spontaneous pneumothorax[right side] with associated pleuritic chest pain and significant shortness of breath and drop in her oxygen saturation. She presented to the emergency department and had a pigtail catheter placed by the ED physician. This immediately improved her symptoms. However since then the lung has only partially reexpanded. She has subcutaneous air. She has a persistent air leak.  Patient has severe parenchymal disease of her sarcoidosis. The lung in this situation is triply very stiff and difficult to fully reexpand. I would recommend placing a larger chest tube to help reexpand the right lung and to help seal the air leak. I discussed the procedure right chest tube placement with the patient we plan on doing this in operating room on August 16 midday. She should be able to return to work current room  Objective: Vital signs in last 24 hours: Temp:  [98.1 F (36.7 C)-98.9 F (37.2 C)] 98.1 F (36.7 C) (08/15 1125) Pulse Rate:  [92-113] 100 (08/15 1657) Cardiac Rhythm: Sinus tachycardia (08/15 1320) Resp:  [14-29] 20 (08/15 1657) BP: (102-129)/(65-88) 129/79 (08/15 1657) SpO2:  [81 %-99 %] 99 % (08/15 1657) FiO2 (%):  [55 %] 55 % (08/15 1351)  Hemodynamic parameters for last 24 hours:    Intake/Output from previous day: 08/14 0701 - 08/15 0700 In: 240 [P.O.:240] Out: 980 [Urine:950; Chest Tube:30] Intake/Output this shift: No intake/output data recorded.       Exam    General- alert and comfortable   Lungs- diminished breath sounds on the right base, scattered rhonchi                 Pigtail catheter placed in right chest anterior axillary line secured to the skin and connected to pleural VAC drainage system   Cor- regular rate and  rhythm, no murmur , gallop   Abdomen- soft, non-tender   Extremities - warm, non-tender, minimal edema   Neuro- oriented, appropriate, no focal weakness   Lab Results: No results for input(s): WBC, HGB, HCT, PLT in the last 72 hours. BMET: No results for input(s): NA, K, CL, CO2, GLUCOSE, BUN, CREATININE, CALCIUM in the last 72 hours.  PT/INR: No results for input(s): LABPROT, INR in the last 72 hours. ABG    Component Value Date/Time   HCO3 26.6 04/18/2017 1746   TCO2 28 04/18/2017 1746   O2SAT 92.0 04/18/2017 1746   CBG (last 3)  No results for input(s): GLUCAP in the last 72 hours.  Assessment/Plan: S/P  Plan chest tube placement in OR tomorrow under IV sedation and local anesthesia. We'll remove pigtail catheter at that time. I discussed the expected benefits of chest tube placement and the expected recovery with the patient. She understands the risks of persistent air leak bleeding infection.   LOS: 4 days    Kathlee Nationseter Van Trigt III 04/22/2017

## 2017-04-22 NOTE — Progress Notes (Addendum)
RT paged to assist with patient complaining of SOB, stats in the ~80's nasal cannula in place with 4L. Patient found to to be having the following: Labored breathing, dyspnea, pursed lips, shallow, and tachypnea. RN coached breathing and increase to 6L until RT came to assist. I will continue to monitor he patient closely.   Sheppard Evensina Evonna Stoltz RN

## 2017-04-22 NOTE — Care Management Important Message (Signed)
Important Message  Patient Details  Name: [name] MRN: [id number] Date of Birth: 1950-10-17   Medicare Important Message Given:  Yes    [name] 04/22/2017, 9:30 AM

## 2017-04-23 ENCOUNTER — Ambulatory Visit: Payer: Medicare Other | Admitting: Adult Health

## 2017-04-23 ENCOUNTER — Inpatient Hospital Stay (HOSPITAL_COMMUNITY): Payer: Medicare Other | Admitting: Certified Registered Nurse Anesthetist

## 2017-04-23 ENCOUNTER — Encounter (HOSPITAL_COMMUNITY)
Admission: EM | Disposition: A | Discharge status: Home Health | Payer: Self-pay | Source: Home / Self Care | Attending: Internal Medicine

## 2017-04-23 ENCOUNTER — Encounter (HOSPITAL_COMMUNITY): Payer: Self-pay | Admitting: Certified Registered Nurse Anesthetist

## 2017-04-23 ENCOUNTER — Inpatient Hospital Stay (HOSPITAL_COMMUNITY): Payer: Medicare Other

## 2017-04-23 HISTORY — PX: CHEST TUBE INSERTION: SHX231

## 2017-04-23 LAB — SURGICAL PCR SCREEN
MRSA, PCR: NEGATIVE
Staphylococcus aureus: POSITIVE — AB

## 2017-04-23 SURGERY — CHEST TUBE INSERTION
Anesthesia: Monitor Anesthesia Care | Laterality: Right

## 2017-04-23 MED ORDER — FENTANYL CITRATE (PF) 100 MCG/2ML IJ SOLN: 25.0000 ug | INTRAMUSCULAR | Status: DC | PRN

## 2017-04-23 MED ORDER — LACTATED RINGERS IV SOLN: INTRAVENOUS | Status: DC

## 2017-04-23 MED ORDER — FENTANYL CITRATE (PF) 100 MCG/2ML IJ SOLN
25.0000 ug | INTRAMUSCULAR | Status: DC | PRN
  Administered 2017-04-23: 50 ug via INTRAVENOUS

## 2017-04-23 MED ORDER — MIDAZOLAM HCL 2 MG/2ML IJ SOLN
INTRAMUSCULAR | Status: AC
  Filled 2017-04-23: qty 2

## 2017-04-23 MED ORDER — ALBUTEROL SULFATE (2.5 MG/3ML) 0.083% IN NEBU
INHALATION_SOLUTION | RESPIRATORY_TRACT | Status: AC
  Administered 2017-04-23: 2.5 mg via RESPIRATORY_TRACT
  Filled 2017-04-23: qty 3

## 2017-04-23 MED ORDER — LIDOCAINE HCL 1 % IJ SOLN
INTRAMUSCULAR | Status: DC | PRN
  Administered 2017-04-23: 8 mL

## 2017-04-23 MED ORDER — LIDOCAINE HCL (PF) 1 % IJ SOLN
INTRAMUSCULAR | Status: AC
  Filled 2017-04-23: qty 30

## 2017-04-23 MED ORDER — LACTATED RINGERS IV SOLN
INTRAVENOUS | Status: DC
  Administered 2017-04-23: 10:00:00 via INTRAVENOUS

## 2017-04-23 MED ORDER — PROMETHAZINE HCL 25 MG/ML IJ SOLN: 6.2500 mg | INTRAMUSCULAR | Status: DC | PRN

## 2017-04-23 MED ORDER — LIDOCAINE 2% (20 MG/ML) 5 ML SYRINGE
INTRAMUSCULAR | Status: DC | PRN
  Administered 2017-04-23: 40 mg via INTRAVENOUS
  Administered 2017-04-23: 30 mg via INTRAVENOUS

## 2017-04-23 MED ORDER — LACTATED RINGERS IV SOLN
INTRAVENOUS | Status: DC | PRN
  Administered 2017-04-23: 13:00:00 via INTRAVENOUS

## 2017-04-23 MED ORDER — MEPERIDINE HCL 25 MG/ML IJ SOLN: 6.2500 mg | INTRAMUSCULAR | Status: DC | PRN

## 2017-04-23 MED ORDER — ALBUTEROL SULFATE (2.5 MG/3ML) 0.083% IN NEBU
2.5000 mg | INHALATION_SOLUTION | Freq: Once | RESPIRATORY_TRACT | Status: AC
  Administered 2017-04-23: 2.5 mg via RESPIRATORY_TRACT

## 2017-04-23 MED ORDER — MIDAZOLAM HCL 5 MG/5ML IJ SOLN
INTRAMUSCULAR | Status: DC | PRN
  Administered 2017-04-23: 2 mg via INTRAVENOUS

## 2017-04-23 MED ORDER — PROPOFOL 500 MG/50ML IV EMUL
INTRAVENOUS | Status: DC | PRN
  Administered 2017-04-23: 100 ug/kg/min via INTRAVENOUS

## 2017-04-23 MED ORDER — FENTANYL CITRATE (PF) 100 MCG/2ML IJ SOLN
INTRAMUSCULAR | Status: DC | PRN
Start: 2017-04-23 — End: 2017-04-23
  Administered 2017-04-23: 50 ug via INTRAVENOUS

## 2017-04-23 MED ORDER — PHENYLEPHRINE HCL 10 MG/ML IJ SOLN
INTRAMUSCULAR | Status: DC | PRN
  Administered 2017-04-23: 80 ug via INTRAVENOUS

## 2017-04-23 MED ORDER — FENTANYL CITRATE (PF) 250 MCG/5ML IJ SOLN
INTRAMUSCULAR | Status: AC
  Filled 2017-04-23: qty 5

## 2017-04-23 MED ORDER — FENTANYL CITRATE (PF) 100 MCG/2ML IJ SOLN
INTRAMUSCULAR | Status: AC
  Filled 2017-04-23: qty 2

## 2017-04-23 SURGICAL SUPPLY — 26 items
CANISTER SUCT 3000ML PPV (MISCELLANEOUS) ×3 IMPLANT
CATH THORACIC 28FR (CATHETERS) IMPLANT
CATH THORACIC 36FR (CATHETERS) IMPLANT
COVER BACK TABLE 60X90IN (DRAPES) ×3 IMPLANT
COVER SURGICAL LIGHT HANDLE (MISCELLANEOUS) ×3 IMPLANT
DRAPE CHEST BREAST 15X10 FENES (DRAPES) ×3 IMPLANT
GAUZE SPONGE 4X4 12PLY STRL (GAUZE/BANDAGES/DRESSINGS) ×3 IMPLANT
GAUZE SPONGE 4X4 12PLY STRL LF (GAUZE/BANDAGES/DRESSINGS) ×2 IMPLANT
GAUZE SPONGE 4X4 16PLY XRAY LF (GAUZE/BANDAGES/DRESSINGS) ×3 IMPLANT
GLOVE BIO SURGEON STRL SZ7.5 (GLOVE) ×3 IMPLANT
GOWN STRL REUS W/ TWL LRG LVL3 (GOWN DISPOSABLE) ×2 IMPLANT
GOWN STRL REUS W/TWL LRG LVL3 (GOWN DISPOSABLE) ×6
KIT BASIN OR (CUSTOM PROCEDURE TRAY) ×3 IMPLANT
KIT ROOM TURNOVER OR (KITS) ×3 IMPLANT
PAD ARMBOARD 7.5X6 YLW CONV (MISCELLANEOUS) ×6 IMPLANT
SUT ETHILON 3 0 FSL (SUTURE) ×2 IMPLANT
SUT SILK  1 MH (SUTURE)
SUT SILK 1 MH (SUTURE) IMPLANT
SYR CONTROL 10ML LL (SYRINGE) ×3 IMPLANT
SYSTEM SAHARA CHEST DRAIN RE-I (WOUND CARE) ×3 IMPLANT
TAPE CLOTH SURG 6X10 WHT LF (GAUZE/BANDAGES/DRESSINGS) ×2 IMPLANT
TOWEL OR 17X26 10 PK STRL BLUE (TOWEL DISPOSABLE) ×3 IMPLANT
TRAP SPECIMEN MUCOUS 40CC (MISCELLANEOUS) ×3 IMPLANT
TRAY CHEST TUBE INSERTION (SET/KITS/TRAYS/PACK) ×3 IMPLANT
TUBE CONNECTING 12'X1/4 (SUCTIONS) ×1
TUBE CONNECTING 12X1/4 (SUCTIONS) ×2 IMPLANT

## 2017-04-23 NOTE — Anesthesia Postprocedure Evaluation (Signed)
Anesthesia Post Note  Patient: [name]  Procedure(s) Performed: Procedure(s) (LRB): CHEST TUBE INSERTION (Right)     Patient location during evaluation: PACU Anesthesia Type: MAC Level of consciousness: awake and alert Pain management: pain level controlled Vital Signs Assessment: post-procedure vital signs reviewed and stable Respiratory status: spontaneous breathing, nonlabored ventilation, respiratory function stable and patient connected to nasal cannula oxygen Cardiovascular status: stable and blood pressure returned to baseline Anesthetic complications: no    Last Vitals:  Vitals:   04/23/17 1430 04/23/17 1445  BP: 124/81 118/76  Pulse: 94 93  Resp: (!) 22 17  Temp:    SpO2: 94% 92%    Last Pain:  Vitals:   04/23/17 1445  TempSrc:   PainSc: [place]

## 2017-04-23 NOTE — Transfer of Care (Cosign Needed)
Immediate Anesthesia Transfer of Care Note  Patient: [name]  Procedure(s) Performed: Procedure(s): CHEST TUBE INSERTION (Right)  Patient Location: PACU  Anesthesia Type:MAC  Level of Consciousness: awake, oriented, drowsy and patient cooperative  Airway & Oxygen Therapy: Patient Spontanous Breathing and Patient connected to face mask oxygen  Post-op Assessment: Report given to RN, Post -op Vital signs reviewed and stable and Post -op Vital signs reviewed and unstable, Anesthesiologist notified  Post vital signs: Reviewed and stable  Last Vitals:  Vitals:   04/22/17 2158 04/23/17 0556  BP:  110/76  Pulse:  88  Resp:  15  Temp:  36.8 C  SpO2: 99%     Last Pain:  Vitals:   04/23/17 0556  TempSrc: Oral  PainSc:       Patients Stated Pain Goal: 0 (22/24/11 4643)  Complications: No apparent anesthesia complications

## 2017-04-23 NOTE — Brief Op Note (Signed)
04/18/2017 - 04/23/2017  1:51 PM  PATIENT:  Margaretha SeedsBarbara V Coello  66 y.o. female  PRE-OPERATIVE DIAGNOSIS:  RIGHT AIRLEAK PTX, sarcoidosis  POST-OPERATIVE DIAGNOSIS:  right airleak, pneumothorax, sarcoidosis  PROCEDURE:  Procedure(s): CHEST TUBE INSERTION (Right)    26 F  SURGEON:  Surgeon(s) and Role:    [name]* Van Trigt, Alonza Knisley, MD - Primary  PHYSICIAN ASSISTANT:   ASSISTANTS: none   ANESTHESIA:   local and IV sedation  EBL:  Total I/O In: 400 [I.V.:400] Out: -   BLOOD ADMINISTERED:none  DRAINS: 26 F Chest Tube(s) in the Right pleural space   LOCAL MEDICATIONS USED:  LIDOCAINE  and Amount: 8 ml  SPECIMEN:  No Specimen  DISPOSITION OF SPECIMEN:  N/A  COUNTS:  YES  TOURNIQUET:  * No tourniquets in log *  DICTATION: .Dragon Dictation  PATIENT DISPOSITION:  return to room 3 E   Delay start of Pharmacological VTE agent (>24hrs) due to surgical blood loss or risk of bleeding: yes

## 2017-04-23 NOTE — Anesthesia Preprocedure Evaluation (Signed)
Anesthesia Evaluation    Reviewed: Allergy & Precautions, Patient's Chart, lab work & pertinent test results  Airway Mallampati: II  TM Distance: >3 FB Neck ROM: Full    Dental  (+) Poor Dentition, Missing,    Pulmonary asthma , former smoker,     + decreased breath sounds      Cardiovascular negative cardio ROS   Rhythm:Regular Rate:Normal     Neuro/Psych  Headaches, Anxiety  Neuromuscular disease    GI/Hepatic Neg liver ROS, hiatal hernia, GERD  Medicated,  Endo/Other  negative endocrine ROS  Renal/GU negative Renal ROS     Musculoskeletal negative musculoskeletal ROS (+)   Abdominal   Peds  Hematology negative hematology ROS (+)   Anesthesia Other Findings Day of surgery medications reviewed with the patient.  Reproductive/Obstetrics                            Echo: - Left ventricle: The cavity size was normal. Wall thickness was   increased in a pattern of mild LVH. Systolic function was normal.   The estimated ejection fraction was in the range of 60% to 65%.   Wall motion was normal; there were no regional wall motion   abnormalities. Doppler parameters are consistent with abnormal   left ventricular relaxation (grade 1 diastolic dysfunction). - Aortic valve: There was no stenosis. - Aorta: Mildly dilated ascending aorta. Aortic root dimension: 36   mm (ED). Ascending aortic diameter: 38 mm (S). - Mitral valve: There was no significant regurgitation. - Right ventricle: Poorly visualized. - Tricuspid valve: Peak RV-RA gradient (S): 27 mm Hg. - Pulmonary arteries: PA peak pressure: 30 mm Hg (S). - Inferior vena cava: The vessel was normal in size. The   respirophasic diameter changes were in the normal range (>= 50%),   consistent with normal central venous pressure.  Anesthesia Physical Anesthesia Plan  ASA: III  Anesthesia Plan: MAC   Post-op Pain Management:     Induction: Intravenous  PONV Risk Score and Plan: 3 and Ondansetron, Dexamethasone, Midazolam and Propofol infusion  Airway Management Planned: Mask  Additional Equipment:   Intra-op Plan:   Post-operative Plan:   Informed Consent: I have reviewed the patients History and Physical, chart, labs and discussed the procedure including the risks, benefits and alternatives for the proposed anesthesia with the patient or authorized representative who has indicated his/her understanding and acceptance.   Dental advisory given  Plan Discussed with: CRNA  Anesthesia Plan Comments:         Anesthesia Quick Evaluation

## 2017-04-23 NOTE — Progress Notes (Signed)
Pre Procedure note for inpatients:   [name] has been scheduled for Procedure(s): CHEST TUBE INSERTION (Right) today. The various methods of treatment have been discussed with the patient. After consideration of the risks, benefits and treatment options the patient has consented to the planned procedure.   The patient has been seen and labs reviewed. There are no changes in the patient's condition to prevent proceeding with the planned procedure today.  Recent labs:  Lab Results  Component Value Date   WBC 9.9 04/19/2017   HGB 11.9 (L) 04/19/2017   HCT 36.5 04/19/2017   PLT 358 04/19/2017   GLUCOSE 157 (H) 04/19/2017   CHOL 187 09/03/2016   TRIG 106.0 09/03/2016   HDL 75.50 09/03/2016   LDLCALC 91 09/03/2016   ALT 27 04/18/2017   AST 39 04/18/2017   NA 137 04/19/2017   K 4.7 04/19/2017   CL 103 04/19/2017   CREATININE 1.12 (H) 04/19/2017   BUN 13 04/19/2017   CO2 25 04/19/2017   TSH 1.38 09/03/2016   INR 0.97 03/07/2014    Mikey BussingPeter Van Trigt III, MD 04/23/2017 12:21 PM

## 2017-04-23 NOTE — Progress Notes (Signed)
PROGRESS NOTE    [name]   RUE:[id number]  DOB: [birth year]  DOA: 04/18/2017 PCP: Pearline Cables, MD   Brief Narrative:  66 y.o. female   pulmonary sarcoid diagnosed 1996  On chronic steroids Quit smoking 1980 Chronic respiratory failure on oxygen since 2015 Grade 1 diastolic heart failure Chronic kidney disease stage I Reflux    Just tapered off of prednisone completely on July 15th.  Followed by Dr. Vassie Loll.  . Patient admitted via emergency room 8/11   EMS put her on CPAP, given Albuterol and Atrovent, hypoxic in 80s, CXR with large right sided pneumothorax and collapse of the lung.   Pulmonology consulted in the emergency room Chest tube but secondary to persistent air leak, cardiovascular surgery consulted regarding placing a larger tube  Subjective:  Sitting up in bed, pleasant Much less anxious--about to go downstairs for chest tube replacement by CVTS Not much chest pain at present  Assessment & Plan:  Pneumothorax on right Significant air leak necessitating placement of a larger bore chest tube We will place on cough medication and probably might need something for anxiety-has started 0.25-0.5 Ativan every 6 when necessary Much appreciated input from pulmonology and CVTS Pain control with 2-4 mg morphine every 4 when necessary for severe pain, Percocet 1 tab every 4 when necessary moderate pain  Active Problems:   PULMONARY SARCOIDOSIS/   Chronic respiratory failure with hypoxia   - Nebs per pulmonary -Continue Pulmicort 0.5 twice a day, Atrovent 0.5 3 times a day, Xopenex every 6 when necessary as well as scheduled, continue Brovana 15 g twice a day For cough continue Tessalon 100 3 times a day when necessary,x 5 mL every 12   Obesity Body mass index is 37.63 kg/m.  Grade 1 diastolic heart failure, compensated  Holding prior to admission Lasix 20 when necessary swelling  Resume on discharge  Bipolar  Resume Elavil this evening 10  mg,  Continue bupropion XL 300 daily, Ativan as above  Reflux   Continue Pepcid 20 twice a day, Protonix 40 daily   DVT prophylaxis: lovenox Code Status: full code Family Communication:  Disposition Plan: home when stable Consultants:   Pulmonary  CVT's Procedures:   Chest tube Antimicrobials:  Anti-infectives    Start     Dose/Rate Route Frequency Ordered Stop   04/23/17 1230  vancomycin (VANCOCIN) IVPB 1000 mg/200 mL premix     1,000 mg 200 mL/hr over 60 Minutes Intravenous  Once 04/22/17 2043         Objective: Vitals:   04/22/17 2157 04/22/17 2158 04/23/17 0556 04/23/17 0647  BP:   110/76   Pulse:   88   Resp:   15   Temp:   98.2 F (36.8 C)   TempSrc:   Oral   SpO2: 99% 99%    Weight:    102.6 kg (226 lb 1.6 oz)  Height:        Intake/Output Summary (Last 24 hours) at 04/23/17 0922 Last data filed at 04/23/17 0700  Gross per 24 hour  Intake              480 ml  Output              250 ml  Net              230 ml   Filed Weights   04/20/17 0453 04/21/17 0409 04/23/17 0647  Weight: 101.4 kg (223 lb 9.6 oz) 100.9 kg (222 lb 7.1  oz) 102.6 kg (226 lb 1.6 oz)    Examination: Alert pleasant oriented no distress Less anxious Abdomen soft nontender nondistended no rebound no guarding Neck is soft supple Air entry is diminished on the left posterior compared to the right posterior-right posterior basal lung fields are tympanitic Lower extremities are soft nontender with no rebound and no guarding I cannot appreciate any JVD There is no rash     Data Reviewed: I have personally reviewed following labs and imaging studies  CBC:  Recent Labs Lab 04/18/17 1730 04/19/17 0241  WBC 9.4 9.9  NEUTROABS 1.1*  --   HGB 12.7 11.9*  HCT 39.0 36.5  MCV 92.9 91.5  PLT 258 358   Basic Metabolic Panel:  Recent Labs Lab 04/18/17 1730 04/19/17 0241  NA 137 137  K 3.9 4.7  CL 103 103  CO2 23 25  GLUCOSE 219* 157*  BUN 13 13  CREATININE 1.31* 1.12*   CALCIUM 9.2 9.3   GFR: Estimated Creatinine Clearance: 57.1 mL/min (A) (by C-G formula based on SCr of 1.12 mg/dL (H)). Liver Function Tests:  Recent Labs Lab 04/18/17 1730  AST 39  ALT 27  ALKPHOS 80  BILITOT 0.4  PROT 8.3*  ALBUMIN 3.8   No results for input(s): LIPASE, AMYLASE in the last 168 hours. No results for input(s): AMMONIA in the last 168 hours. Coagulation Profile: No results for input(s): INR, PROTIME in the last 168 hours. Cardiac Enzymes: No results for input(s): CKTOTAL, CKMB, CKMBINDEX, TROPONINI in the last 168 hours. BNP (last 3 results) No results for input(s): PROBNP in the last 8760 hours. HbA1C: No results for input(s): HGBA1C in the last 72 hours. CBG: No results for input(s): GLUCAP in the last 168 hours. Lipid Profile: No results for input(s): CHOL, HDL, LDLCALC, TRIG, CHOLHDL, LDLDIRECT in the last 72 hours. Thyroid Function Tests: No results for input(s): TSH, T4TOTAL, FREET4, T3FREE, THYROIDAB in the last 72 hours. Anemia Panel: No results for input(s): VITAMINB12, FOLATE, FERRITIN, TIBC, IRON, RETICCTPCT in the last 72 hours. Urine analysis:    Component Value Date/Time   BILIRUBINUR Negaitve 03/20/2016 1158   PROTEINUR negative 03/20/2016 1158   UROBILINOGEN negative 03/20/2016 1158   NITRITE negative 03/20/2016 1158   LEUKOCYTESUR Negative 03/20/2016 1158   Sepsis Labs: @LABRCNTIP (procalcitonin:4,lacticidven:4) ) Recent Results (from the past 240 hour(s))  Surgical PCR screen     Status: Abnormal   Collection Time: 04/23/17  4:20 AM  Result Value Ref Range Status   MRSA, PCR NEGATIVE NEGATIVE Final   Staphylococcus aureus POSITIVE (A) NEGATIVE Final    Comment:        The Xpert SA Assay (FDA approved for NASAL specimens in patients over 39 years of age), is one component of a comprehensive surveillance program.  Test performance has been validated by [facility] for patients greater than or equal to 71 year old. It is not  intended to diagnose infection nor to guide or monitor treatment.          Radiology Studies: Dg Chest Port 1 View  Result Date: 04/23/2017 CLINICAL DATA:  Right-sided chest tube.  Shortness of breath. EXAM: PORTABLE CHEST 1 VIEW COMPARISON:  04/22/2017.  06/27/2016.  04/04/2015.  CT 04/19/2017. FINDINGS: Right chest tube noted in stable position. Small right apical pneumothorax again noted without interim change. Stable cardiomegaly and prominent central pulmonary artery. Diffuse bilateral pulmonary interstitial prominence again noted consistent with chronic interstitial lung disease without interim change. Bronchiectasis again noted without interim change. Right  chest wall subcutaneous emphysema again noted. Surgical clips right shoulder. IMPRESSION: 1. Right chest tube in stable position. Stable small right apical pneumothorax again noted. Stable diffuse right chest wall subcutaneous emphysema. 2. Chronic interstitial lung disease and bronchiectasis again noted without interim change. 3. Stable cardiomegaly and prominent central pulmonary arteries suggesting pulmonary hypertension. Electronically Signed   By: Maisie Fushomas  Register   On: 04/23/2017 06:53   Dg Chest Port 1 View  Result Date: 04/22/2017 CLINICAL DATA:  Chest tube.  Pneumothorax. EXAM: PORTABLE CHEST 1 VIEW COMPARISON:  Radiograph of April 21, 2017. FINDINGS: Stable cardiomediastinal silhouette. Stable mild right apical pneumothorax is noted. Stable large amount of subcutaneous emphysema is seen over right lateral chest wall. Stable position of right-sided pigtail drainage catheter. Stable interstitial densities are noted throughout both lungs, most prominently seen in the lung bases, consistent chronic interstitial lung disease. IMPRESSION: Stable position of right-sided pigtail drainage catheter. Stable mild right apical pneumothorax is noted. Stable subcutaneous emphysema is seen overlying right lateral chest wall. Electronically  Signed   By: Lupita RaiderJames  Green Jr, M.D.   On: 04/22/2017 12:05      Scheduled Meds: . arformoterol  15 mcg Nebulization BID  . budesonide (PULMICORT) nebulizer solution  0.5 mg Nebulization BID  . buPROPion  300 mg Oral Daily  . chlorpheniramine-HYDROcodone  5 mL Oral Q12H  . cycloSPORINE  1 drop Both Eyes BID  . enoxaparin (LOVENOX) injection  40 mg Subcutaneous Q24H  . famotidine  20 mg Oral BID  . fluticasone  1 spray Each Nare TID  . ipratropium  0.5 mg Nebulization TID  . levalbuterol  0.63 mg Nebulization TID  . lidocaine  1 patch Transdermal Q24H  . loratadine  10 mg Oral Daily  . multivitamin with minerals  1 tablet Oral Daily  . pantoprazole  40 mg Oral Daily   Continuous Infusions: . lactated ringers    . vancomycin       LOS: 5 days    Time spent in minutes: 35    Rhetta MuraSAMTANI, JAI-GURMUKH, MD [facility] Pager: [email or web address] Password [email or web address] 04/23/2017, 9:22 AM

## 2017-04-23 NOTE — Plan of Care (Signed)
Problem: Safety: Goal: Ability to remain free from injury will improve Outcome: Completed/Met Date Met: 04/23/17 Compliant with use of call light/phone to request assistance  Problem: Pain Managment: Goal: General experience of comfort will improve Outcome: Progressing Pt able to rest with use of PRN pain medications

## 2017-04-23 NOTE — Progress Notes (Signed)
MRSA swab, CHG bath, Pre-procedure, and consent all completed.

## 2017-04-24 ENCOUNTER — Inpatient Hospital Stay (HOSPITAL_COMMUNITY): Payer: Medicare Other

## 2017-04-24 ENCOUNTER — Encounter (HOSPITAL_COMMUNITY): Payer: Self-pay | Admitting: Cardiothoracic Surgery

## 2017-04-24 MED ORDER — LEVALBUTEROL HCL 0.63 MG/3ML IN NEBU: 0.6300 mg | INHALATION_SOLUTION | RESPIRATORY_TRACT | Status: DC | PRN

## 2017-04-24 NOTE — Progress Notes (Signed)
PROGRESS NOTE    [name]   YQM:[id number]  DOB: [birth year]  DOA: 04/18/2017 PCP: Pearline Cables, MD   Brief Narrative:  66 y.o. female   pulmonary sarcoid diagnosed 1996  On chronic steroids Quit smoking 1980 Chronic respiratory failure on oxygen since 2015 Grade 1 diastolic heart failure Chronic kidney disease stage I Reflux    Just tapered off of prednisone completely on July 15th.  Followed by Dr. Vassie Loll.  . Patient admitted via emergency room 8/11   EMS put her on CPAP, given Albuterol and Atrovent, hypoxic in 80s, CXR with large right sided pneumothorax and collapse of the lung.   Pulmonology consulted in the emergency room Chest tube but secondary to persistent air leak, cardiovascular surgery consulted regarding placing a larger tube  Subjective:  Sitting up in bed, pleasant  chest pain from tube ~7-8 but controllablet coughing some No n/v  Assessment & Plan:  Pneumothorax on right Significant air leak necessitating placement of a larger bore chest tube We will place on cough medication and probably might need something for anxiety-has started 0.25-0.5 Ativan every 6 when necessary Much appreciated input from pulmonology and CVTS Pain control with 2-4 mg morphine every 4 when necessary for severe pain, Percocet 1 tab every 4 when necessary moderate pain  Active Problems:   PULMONARY SARCOIDOSIS/   Chronic respiratory failure with hypoxia   - Nebs per pulmonary -Continue Pulmicort 0.5 twice a day, Atrovent 0.5 3 times a day, Xopenex every 6 when necessary as well as scheduled, continue Brovana 15 g twice a day For cough continue Tessalon 100 3 times a day when necessary,x 5 mL every 12   Obesity Body mass index is 36.58 kg/m.  Grade 1 diastolic heart failure, compensated  Holding prior to admission Lasix 20 when necessary swelling  Resume on discharge  Bipolar  Resume Elavil this evening 10 mg,  Continue bupropion XL 300 daily, Ativan as  above  Reflux   Continue Pepcid 20 twice a day, Protonix 40 daily   DVT prophylaxis: lovenox Code Status: full code Family Communication:  Disposition Plan: home when stable  Consultants:   Pulmonary  CVT's  Procedures:   Chest tube Antimicrobials:  Anti-infectives    Start     Dose/Rate Route Frequency Ordered Stop   04/23/17 1230  vancomycin (VANCOCIN) IVPB 1000 mg/200 mL premix     1,000 mg 200 mL/hr over 60 Minutes Intravenous  Once 04/22/17 2043 04/23/17 1714       Objective: Vitals:   04/24/17 0329 04/24/17 0745 04/24/17 1100 04/24/17 1305  BP: 122/90     Pulse: 95  87 91  Resp: 19  16 16   Temp: 99 F (37.2 C)     TempSrc: Oral     SpO2: 95% 94% 97% 100%  Weight: 99.7 kg (219 lb 12.8 oz)     Height:        Intake/Output Summary (Last 24 hours) at 04/24/17 1319 Last data filed at 04/24/17 0900  Gross per 24 hour  Intake           900.17 ml  Output              945 ml  Net           -44.83 ml   Filed Weights   04/21/17 0409 04/23/17 0647 04/24/17 0329  Weight: 100.9 kg (222 lb 7.1 oz) 102.6 kg (226 lb 1.6 oz) 99.7 kg (219 lb 12.8 oz)  Examination:  Alert pleasant oriented no distress Less anxious Abdomen soft nontender nondistended no rebound no guarding Neck is soft supple Air entry is diminished on the left posterior compared to the right posterior-right posterior basal lung fields are tympanitic  Chest tube in place Lower extremities are soft nontender with no rebound and no guarding I cannot appreciate any JVD There is no rash     Data Reviewed: I have personally reviewed following labs and imaging studies  CBC:  Recent Labs Lab 04/18/17 1730 04/19/17 0241  WBC 9.4 9.9  NEUTROABS 1.1*  --   HGB 12.7 11.9*  HCT 39.0 36.5  MCV 92.9 91.5  PLT 258 358   Basic Metabolic Panel:  Recent Labs Lab 04/18/17 1730 04/19/17 0241  NA 137 137  K 3.9 4.7  CL 103 103  CO2 23 25  GLUCOSE 219* 157*  BUN 13 13  CREATININE 1.31*  1.12*  CALCIUM 9.2 9.3   GFR: Estimated Creatinine Clearance: 56.2 mL/min (A) (by C-G formula based on SCr of 1.12 mg/dL (H)). Liver Function Tests:  Recent Labs Lab 04/18/17 1730  AST 39  ALT 27  ALKPHOS 80  BILITOT 0.4  PROT 8.3*  ALBUMIN 3.8   No results for input(s): LIPASE, AMYLASE in the last 168 hours. No results for input(s): AMMONIA in the last 168 hours. Coagulation Profile: No results for input(s): INR, PROTIME in the last 168 hours. Cardiac Enzymes: No results for input(s): CKTOTAL, CKMB, CKMBINDEX, TROPONINI in the last 168 hours. BNP (last 3 results) No results for input(s): PROBNP in the last 8760 hours. HbA1C: No results for input(s): HGBA1C in the last 72 hours. CBG: No results for input(s): GLUCAP in the last 168 hours. Lipid Profile: No results for input(s): CHOL, HDL, LDLCALC, TRIG, CHOLHDL, LDLDIRECT in the last 72 hours. Thyroid Function Tests: No results for input(s): TSH, T4TOTAL, FREET4, T3FREE, THYROIDAB in the last 72 hours. Anemia Panel: No results for input(s): VITAMINB12, FOLATE, FERRITIN, TIBC, IRON, RETICCTPCT in the last 72 hours. Urine analysis:    Component Value Date/Time   BILIRUBINUR Negaitve 03/20/2016 1158   PROTEINUR negative 03/20/2016 1158   UROBILINOGEN negative 03/20/2016 1158   NITRITE negative 03/20/2016 1158   LEUKOCYTESUR Negative 03/20/2016 1158   Sepsis Labs: @LABRCNTIP (procalcitonin:4,lacticidven:4) ) Recent Results (from the past 240 hour(s))  Surgical PCR screen     Status: Abnormal   Collection Time: 04/23/17  4:20 AM  Result Value Ref Range Status   MRSA, PCR NEGATIVE NEGATIVE Final   Staphylococcus aureus POSITIVE (A) NEGATIVE Final    Comment:        The Xpert SA Assay (FDA approved for NASAL specimens in patients over 106 years of age), is one component of a comprehensive surveillance program.  Test performance has been validated by [facility] for patients greater than or equal to 34 year old. It  is not intended to diagnose infection nor to guide or monitor treatment.          Radiology Studies: Dg Chest Port 1 View  Result Date: 04/24/2017 CLINICAL DATA:  Follow-up chest tube EXAM: PORTABLE CHEST 1 VIEW COMPARISON:  04/23/2017 FINDINGS: Right thoracostomy catheter is again identified and stable. The tiny apical pneumothorax is again noted. Diffuse fibrotic changes are noted bilaterally. No new focal infiltrate is seen. Significant subcutaneous emphysema is noted. No bony abnormality is noted. IMPRESSION: Stable right apical pneumothorax. Chronic fibrotic changes bilaterally. Electronically Signed   By: Alcide Clever M.D.   On: 04/24/2017 07:16  Dg Chest Portable 1 View  Result Date: 04/23/2017 CLINICAL DATA:  Chest tube insertion EXAM: PORTABLE CHEST 1 VIEW COMPARISON:  Study obtained earlier in the day FINDINGS: Right-sided pigtail catheter has been removed with a new chest tube present on the right, directed medially. Pneumothorax on the right is smaller with a small apical pneumothorax on the right remaining. There is extensive soft tissue air on the right. There is underlying interstitial fibrosis with superimposed interstitial edema and small pleural effusions. Heart is prominent in size with mild pulmonary venous hypertension. No adenopathy is evident. No bone lesions. IMPRESSION: Pneumothorax smaller after chest tube replacement. No tension component. Extensive subcutaneous air remains the right. Findings are present felt to be indicative of a degree of congestive heart failure superimposed on underlying pulmonary fibrosis. Stable cardiac prominence. Electronically Signed   By: Bretta Bang III M.D.   On: 04/23/2017 13:46   Dg Chest Port 1 View  Result Date: 04/23/2017 CLINICAL DATA:  Right-sided chest tube.  Shortness of breath. EXAM: PORTABLE CHEST 1 VIEW COMPARISON:  04/22/2017.  06/27/2016.  04/04/2015.  CT 04/19/2017. FINDINGS: Right chest tube noted in stable position.  Small right apical pneumothorax again noted without interim change. Stable cardiomegaly and prominent central pulmonary artery. Diffuse bilateral pulmonary interstitial prominence again noted consistent with chronic interstitial lung disease without interim change. Bronchiectasis again noted without interim change. Right chest wall subcutaneous emphysema again noted. Surgical clips right shoulder. IMPRESSION: 1. Right chest tube in stable position. Stable small right apical pneumothorax again noted. Stable diffuse right chest wall subcutaneous emphysema. 2. Chronic interstitial lung disease and bronchiectasis again noted without interim change. 3. Stable cardiomegaly and prominent central pulmonary arteries suggesting pulmonary hypertension. Electronically Signed   By: Maisie Fus  Register   On: 04/23/2017 06:53      Scheduled Meds: . arformoterol  15 mcg Nebulization BID  . budesonide (PULMICORT) nebulizer solution  0.5 mg Nebulization BID  . buPROPion  300 mg Oral Daily  . chlorpheniramine-HYDROcodone  5 mL Oral Q12H  . cycloSPORINE  1 drop Both Eyes BID  . enoxaparin (LOVENOX) injection  40 mg Subcutaneous Q24H  . famotidine  20 mg Oral BID  . fluticasone  1 spray Each Nare TID  . lidocaine  1 patch Transdermal Q24H  . loratadine  10 mg Oral Daily  . multivitamin with minerals  1 tablet Oral Daily  . pantoprazole  40 mg Oral Daily   Continuous Infusions: . lactated ringers 10 mL/hr at 04/23/17 0958     LOS: 6 days    Time spent in minutes: 25    Rhetta Mura, MD [facility] Pager: [email or web address] Password [email or web address] 04/24/2017, 1:19 PM

## 2017-04-24 NOTE — Plan of Care (Signed)
Problem: Pain Managment: Goal: General experience of comfort will improve Outcome: Progressing PRN pain medication given for right flank pain

## 2017-04-24 NOTE — Plan of Care (Signed)
Problem: Education: Goal: Knowledge of  General Education information/materials will improve Outcome: Progressing Pt verbalizes understanding of plan of care and is oriented to hospital and unit routines.  Problem: Pain Managment: Goal: General experience of comfort will improve Outcome: Progressing IV and oral pain medications are effective in controlling pt's pain.  Problem: Physical Regulation: Goal: Ability to maintain clinical measurements within normal limits will improve Outcome: Progressing VSS. Goal: Will remain free from infection Outcome: Progressing No s/s of infection.

## 2017-04-24 NOTE — Progress Notes (Signed)
PCCM Interval Note  Appreciate Dr VanTrigt's assistance. Hopefully larger chest tube will give more effective drainage. Still waiting for air leak to resolve - may ultimately require surgical intervention to close BPF.   I will sign off and follow peripherally.    Levy Pupa, MD, PhD 04/24/2017, 1:46 PM [facility] [phone] or if no answer [phone]

## 2017-04-24 NOTE — Op Note (Signed)
NAME:  CYBELE, FOLLMAN                   ACCOUNT NO.:  MEDICAL RECORD NO.:  [id number]  LOCATION:                                 FACILITY:  PHYSICIAN:  [name], M.D.       DATE OF BIRTH:  DATE OF PROCEDURE:  04/23/2017 DATE OF DISCHARGE:                              OPERATIVE REPORT   OPERATION:  Placement of right chest tube.  PREOPERATIVE DIAGNOSES:  Spontaneous right pneumothorax, sarcoidosis of the lung.  POSTOPERATIVE DIAGNOSES:  Spontaneous right pneumothorax, sarcoidosis of the lung.  SURGEON:  [name], M.D.  ANESTHESIA:  IV conscious sedation with local 1% lidocaine.  DESCRIPTION OF PROCEDURE:  The patient was brought from preop holding to the operating room after informed consent had been obtained and the procedure discussed in detail including the benefits, risks, and alternatives.  The patient was placed supine on the operating room table with a bump-soft blanket under the right shoulder.  The previously placed pigtail catheter was removed.  The right chest was prepped and draped as a sterile field.  A proper time-out was performed.  Lidocaine was infiltrated at the right breast line at the anterior axillary line.  A small incision was made.  Further lidocaine was infiltrated to the chest wall down to the intercostal muscle.  Palpation with a single finger identified the fifth interspace and a 28-French chest tube was inserted into the pleural space and directed to the apex of the right chest.  This was connected to a Pleur-Evac drainage system and chest tube was secured with 2 silk sutures and a sterile dressing was applied.  The patient was then placed supine and the chest x-ray in the operating room showed good position of the chest tube with improvement of the pneumothorax.     [name], M.D.     PV/MEDQ  D:  04/23/2017  T:  04/23/2017  Job:  [id number]

## 2017-04-24 NOTE — Progress Notes (Addendum)
      [address]       [name] [id number]             [phone]      1 Day Post-Op Procedure(s) (LRB): CHEST TUBE INSERTION (Right) Subjective: Shares that she is having some pain at the chest tube site. Has not had pain medication this morning.   Objective: Vital signs in last 24 hours: Temp:  [97.5 F (36.4 C)-99 F (37.2 C)] 99 F (37.2 C) (08/17 0329) Pulse Rate:  [93-100] 95 (08/17 0329) Cardiac Rhythm: Normal sinus rhythm (08/16 2030) Resp:  [15-23] 19 (08/17 0329) BP: (118-128)/(76-90) 122/90 (08/17 0329) SpO2:  [92 %-100 %] 94 % (08/17 0745) Weight:  [99.7 kg (219 lb 12.8 oz)] 99.7 kg (219 lb 12.8 oz) (08/17 0329)     Intake/Output from previous day: 08/16 0701 - 08/17 0700 In: 660.2 [I.V.:660.2] Out: 945 [Urine:900; Chest Tube:45] Intake/Output this shift: No intake/output data recorded.  General appearance: alert, cooperative and no distress Heart: regular rate and rhythm, S1, S2 normal, no murmur, click, rub or gallop Lungs: clear to auscultation bilaterally Abdomen: soft, non-tender; bowel sounds normal; no masses,  no organomegaly Extremities: extremities normal, atraumatic, no cyanosis or edema Wound: clean and dry  Lab Results: No results for input(s): WBC, HGB, HCT, PLT in the last 72 hours. BMET: No results for input(s): NA, K, CL, CO2, GLUCOSE, BUN, CREATININE, CALCIUM in the last 72 hours.  PT/INR: No results for input(s): LABPROT, INR in the last 72 hours. ABG    Component Value Date/Time   HCO3 26.6 04/18/2017 1746   TCO2 28 04/18/2017 1746   O2SAT 92.0 04/18/2017 1746   CBG (last 3)  No results for input(s): GLUCAP in the last 72 hours.  Assessment/Plan: S/P Procedure(s) (LRB): CHEST TUBE INSERTION (Right)  1. S/p Chest tube for pneumothorax. CXR this morning shows right apical pneumo which is stable. Only 88ml/24 hours. ++airleak and fluid wave. 2. BP well controlled, NSR 90s 3. Renal-creatinine 1.12 on  8/12  Plan: Continue chest tube. Stable pneumo on CXR this morning. Continue medical care per primary.    LOS: 6 days    [name] 04/24/2017 Chest x-ray today shows resolution of the right pneumothorax Continue suction at 30 cm then slowly reduced suction daily to water seal The patient has very stiff lungs from end-stage sarcoidosis and spontaneous pneumothorax is difficult to treat. She will need daily chest x-ray patient examined and medical record reviewed,agree with above note. [name] 04/24/2017

## 2017-04-25 ENCOUNTER — Inpatient Hospital Stay (HOSPITAL_COMMUNITY): Payer: Medicare Other

## 2017-04-25 MED ORDER — BISACODYL 5 MG PO TBEC
5.0000 mg | DELAYED_RELEASE_TABLET | Freq: Every day | ORAL | Status: DC | PRN
  Administered 2017-04-26 – 2017-04-27 (×2): 5 mg via ORAL
  Filled 2017-04-25 (×2): qty 1

## 2017-04-25 NOTE — Progress Notes (Signed)
PROGRESS NOTE    [name]   WYS:[id number]  DOB: August 20, 1951  DOA: 04/18/2017 PCP: Pearline Cables, MD   Brief Narrative:   66 y.o. female   pulmonary sarcoid diagnosed 1996  On chronic steroids Quit smoking 1980 Chronic respiratory failure on oxygen since 2015 Grade 1 diastolic heart failure Chronic kidney disease stage I Reflux    Just tapered off of prednisone completely on July 15th.  Followed by Dr. Vassie Loll.  . Patient admitted via emergency room 8/11   EMS put her on CPAP, given Albuterol and Atrovent, hypoxic in 80s, CXR with large right sided pneumothorax and collapse of the lung.   Pulmonology consulted in the emergency room Chest tube but secondary to persistent air leak, cardiovascular surgery consulted regarding placing a larger tube  Doing fair  Subjective:  Sitting up in bed, pleasant  chest pain from tube ~7-8 but controllable coughing some No n/v  Assessment & Plan:  Pneumothorax on right Significant air leak necessitating placement of a larger bore chest tube We will place on cough medication and probably might need something for anxiety-has started 0.25-0.5 Ativan every 6 when necessary Much appreciated input from pulmonology and CVTS Pain control with 2-4 mg morphine every 4 when necessary for severe pain, Percocet 1 tab every 4 when necessary moderate pain    PULMONARY SARCOIDOSIS   Chronic respiratory failure with hypoxia   Nebs per pulmonary Continue Pulmicort 0.5 twice a day, Atrovent 0.5 3 times a day, Xopenex every 6 when necessary as well as scheduled, continue Brovana 15 g twice a day For cough continue Tessalon 100 3 times a day when necessary,x 5 mL every 12  Obesity Body mass index is 36.39 kg/m.  Grade 1 diastolic heart failure, compensated  Holding prior to admission Lasix 20 when necessary swelling  Resume on discharge  Bipolar  Resume Elavil this evening 10 mg,  Continue bupropion XL 300 daily, Ativan as  above  Reflux   Continue Pepcid 20 twice a day, Protonix 40 daily   DVT prophylaxis: lovenox Code Status: full code Family Communication:  Disposition Plan: home when stable  Consultants:   Pulmonary  CVT's  Procedures:   Chest tube  Antimicrobials:  Anti-infectives    Start     Dose/Rate Route Frequency Ordered Stop   04/23/17 1230  vancomycin (VANCOCIN) IVPB 1000 mg/200 mL premix     1,000 mg 200 mL/hr over 60 Minutes Intravenous  Once 04/22/17 2043 04/23/17 1714       Objective: Vitals:   04/25/17 0738 04/25/17 1208 04/25/17 1500 04/25/17 1505  BP:  108/79 115/75 115/75  Pulse:  94 100 100  Resp:  17  (!) 23  Temp:   98.1 F (36.7 C) 98.1 F (36.7 C)  TempSrc:   Oral Oral  SpO2: 97% 97%  95%  Weight:      Height:        Intake/Output Summary (Last 24 hours) at 04/25/17 1632 Last data filed at 04/25/17 1500  Gross per 24 hour  Intake           885.17 ml  Output             1220 ml  Net          -334.83 ml   Filed Weights   04/23/17 0647 04/24/17 0329 04/25/17 0450  Weight: 102.6 kg (226 lb 1.6 oz) 99.7 kg (219 lb 12.8 oz) 99.2 kg (218 lb 11.2 oz)    Examination:  Pleasant oriented no feevr no chills Chest tube in situ Smiling abd soft nt nd no rebound No le edema No rash  Data Reviewed: I have personally reviewed following labs and imaging studies  CBC:  Recent Labs Lab 04/18/17 1730 04/19/17 0241  WBC 9.4 9.9  NEUTROABS 1.1*  --   HGB 12.7 11.9*  HCT 39.0 36.5  MCV 92.9 91.5  PLT 258 358   Basic Metabolic Panel:  Recent Labs Lab 04/18/17 1730 04/19/17 0241  NA 137 137  K 3.9 4.7  CL 103 103  CO2 23 25  GLUCOSE 219* 157*  BUN 13 13  CREATININE 1.31* 1.12*  CALCIUM 9.2 9.3   GFR: Estimated Creatinine Clearance: 56.1 mL/min (A) (by C-G formula based on SCr of 1.12 mg/dL (H)). Liver Function Tests:  Recent Labs Lab 04/18/17 1730  AST 39  ALT 27  ALKPHOS 80  BILITOT 0.4  PROT 8.3*  ALBUMIN 3.8   No results for  input(s): LIPASE, AMYLASE in the last 168 hours. No results for input(s): AMMONIA in the last 168 hours. Coagulation Profile: No results for input(s): INR, PROTIME in the last 168 hours. Cardiac Enzymes: No results for input(s): CKTOTAL, CKMB, CKMBINDEX, TROPONINI in the last 168 hours. BNP (last 3 results) No results for input(s): PROBNP in the last 8760 hours. HbA1C: No results for input(s): HGBA1C in the last 72 hours. CBG: No results for input(s): GLUCAP in the last 168 hours. Lipid Profile: No results for input(s): CHOL, HDL, LDLCALC, TRIG, CHOLHDL, LDLDIRECT in the last 72 hours. Thyroid Function Tests: No results for input(s): TSH, T4TOTAL, FREET4, T3FREE, THYROIDAB in the last 72 hours. Anemia Panel: No results for input(s): VITAMINB12, FOLATE, FERRITIN, TIBC, IRON, RETICCTPCT in the last 72 hours. Urine analysis:    Component Value Date/Time   BILIRUBINUR Negaitve 03/20/2016 1158   PROTEINUR negative 03/20/2016 1158   UROBILINOGEN negative 03/20/2016 1158   NITRITE negative 03/20/2016 1158   LEUKOCYTESUR Negative 03/20/2016 1158    Radiology Studies: Dg Chest Port 1 View  Result Date: 04/25/2017 CLINICAL DATA:  Right-sided chest tube EXAM: PORTABLE CHEST 1 VIEW COMPARISON:  04/24/2017 FINDINGS: Bilateral diffuse interstitial thickening. Right-sided chest tube directed towards the apex. Right apical pneumothorax measuring 2 cm at the apex similar in appearance to the prior exam. No left pneumothorax. No pleural effusion. Stable cardiomediastinal silhouette. Extensive soft tissue emphysema along the right lateral chest wall and the right and left side of the neck. IMPRESSION: 1. Right-sided chest tube directed towards the apex. Right apical pneumothorax measuring 2 cm at the apex similar in appearance to the prior exam. 2. Chronic interstitial lung disease. Electronically Signed   By: Elige Ko   On: 04/25/2017 08:14   Dg Chest Port 1 View  Result Date:  04/24/2017 CLINICAL DATA:  Follow-up chest tube EXAM: PORTABLE CHEST 1 VIEW COMPARISON:  04/23/2017 FINDINGS: Right thoracostomy catheter is again identified and stable. The tiny apical pneumothorax is again noted. Diffuse fibrotic changes are noted bilaterally. No new focal infiltrate is seen. Significant subcutaneous emphysema is noted. No bony abnormality is noted. IMPRESSION: Stable right apical pneumothorax. Chronic fibrotic changes bilaterally. Electronically Signed   By: Alcide Clever M.D.   On: 04/24/2017 07:16   Scheduled Meds: . arformoterol  15 mcg Nebulization BID  . budesonide (PULMICORT) nebulizer solution  0.5 mg Nebulization BID  . buPROPion  300 mg Oral Daily  . chlorpheniramine-HYDROcodone  5 mL Oral Q12H  . cycloSPORINE  1 drop Both Eyes BID  .  enoxaparin (LOVENOX) injection  40 mg Subcutaneous Q24H  . famotidine  20 mg Oral BID  . fluticasone  1 spray Each Nare TID  . lidocaine  1 patch Transdermal Q24H  . loratadine  10 mg Oral Daily  . multivitamin with minerals  1 tablet Oral Daily  . pantoprazole  40 mg Oral Daily   Continuous Infusions: . lactated ringers 10 mL/hr at 04/25/17 1500     LOS: 7 days    Time spent in minutes: 25  Pleas Koch, MD [facility] [phone]

## 2017-04-25 NOTE — Progress Notes (Signed)
      [address]       [place], [address]             [phone]      2 Days Post-Op Procedure(s) (LRB): CHEST TUBE INSERTION (Right) Subjective: Feels good this morning. Has some incisional pain at the chest tube site but the medication is working.   Objective: Vital signs in last 24 hours: Temp:  [98 F (36.7 C)-98.4 F (36.9 C)] 98.4 F (36.9 C) (08/18 0450) Pulse Rate:  [87-91] 87 (08/18 0450) Cardiac Rhythm: Normal sinus rhythm (08/18 0930) Resp:  [11-20] 20 (08/18 0450) BP: (100-118)/(62-78) 100/74 (08/18 0450) SpO2:  [94 %-100 %] 97 % (08/18 0738) Weight:  [99.2 kg (218 lb 11.2 oz)] 99.2 kg (218 lb 11.2 oz) (08/18 0450)    Intake/Output from previous day: 08/17 0701 - 08/18 0700 In: 1085.2 [P.O.:840; I.V.:245.2] Out: 920 [Urine:800; Chest Tube:120] Intake/Output this shift: Total I/O In: -  Out: 300 [Urine:300]  General appearance: alert, cooperative and no distress Heart: regular rate and rhythm, S1, S2 normal, no murmur, click, rub or gallop Lungs: clear to auscultation bilaterally Abdomen: soft, non-tender; bowel sounds normal; no masses,  no organomegaly Extremities: extremities normal, atraumatic, no cyanosis or edema Wound: clean and dry  Lab Results: No results for input(s): WBC, HGB, HCT, PLT in the last 72 hours. BMET: No results for input(s): NA, K, CL, CO2, GLUCOSE, BUN, CREATININE, CALCIUM in the last 72 hours.  PT/INR: No results for input(s): LABPROT, INR in the last 72 hours. ABG    Component Value Date/Time   HCO3 26.6 04/18/2017 1746   TCO2 28 04/18/2017 1746   O2SAT 92.0 04/18/2017 1746   CBG (last 3)  No results for input(s): GLUCAP in the last 72 hours.  Assessment/Plan: S/P Procedure(s) (LRB): CHEST TUBE INSERTION (Right)  1. S/p Chest tube for pneumothorax. CXR this morning shows 1. Right-sided chest tube directed towards the apex. Right apical pneumothorax measuring 2 cm at the apex similar in appearance  to the prior exam. Chronic interstitial lung disease. 175ml/24 hours. ++airleak and fluid wave. 2. BP well controlled, NSR 80s 3. Renal-creatinine 1.12 on 8/12  Plan: Continue chest tube to suction. Stable pneumo on CXR this morning. Continue medical care per primary.    LOS: 7 days    [name] 04/25/2017

## 2017-04-26 ENCOUNTER — Inpatient Hospital Stay (HOSPITAL_COMMUNITY): Payer: Medicare Other

## 2017-04-26 MED ORDER — POLYETHYLENE GLYCOL 3350 17 G PO PACK
17.0000 g | PACK | Freq: Once | ORAL | Status: AC
  Administered 2017-04-26: 17 g via ORAL
  Filled 2017-04-26: qty 1

## 2017-04-26 NOTE — Progress Notes (Addendum)
      [address]       [place] [address]             [phone]      3 Days Post-Op Procedure(s) (LRB): CHEST TUBE INSERTION (Right) Subjective: Feels okay this morning, Not as short of breath when ambulating to the bathroom. Down to 1.5L [place].   Objective: Vital signs in last 24 hours: Temp:  [98.1 F (36.7 C)-98.9 F (37.2 C)] 98.3 F (36.8 C) (08/19 0440) Pulse Rate:  [85-100] 85 (08/19 0440) Cardiac Rhythm: Normal sinus rhythm (08/19 0838) Resp:  [14-23] 14 (08/19 0440) BP: (98-115)/(65-79) 98/65 (08/19 0440) SpO2:  [94 %-99 %] 99 % (08/19 0726) Weight:  [98.8 kg (217 lb 12.8 oz)] 98.8 kg (217 lb 12.8 oz) (08/19 0440)     Intake/Output from previous day: 08/18 0701 - 08/19 0700 In: 640 [P.O.:560; I.V.:80] Out: 980 [Urine:900; Chest Tube:80] Intake/Output this shift: No intake/output data recorded.  General appearance: alert, cooperative and no distress Heart: regular rate and rhythm, S1, S2 normal, no murmur, click, rub or gallop Lungs: clear to auscultation bilaterally Abdomen: soft, non-tender; bowel sounds normal; no masses,  no organomegaly Extremities: extremities normal, atraumatic, no cyanosis or edema Wound: clean and dry  Lab Results: No results for input(s): WBC, HGB, HCT, PLT in the last 72 hours. BMET: No results for input(s): NA, K, CL, CO2, GLUCOSE, BUN, CREATININE, CALCIUM in the last 72 hours.  PT/INR: No results for input(s): LABPROT, INR in the last 72 hours. ABG    Component Value Date/Time   HCO3 26.6 04/18/2017 1746   TCO2 28 04/18/2017 1746   O2SAT 92.0 04/18/2017 1746   CBG (last 3)  No results for input(s): GLUCAP in the last 72 hours.  Assessment/Plan: S/P Procedure(s) (LRB): CHEST TUBE INSERTION (Right)  1. S/p Chest tube for pneumothorax. CXR pending this AM. 36ml/24 hours. ++airleak and fluid wave. Tolerating 1.5L [place]. Wean to 20cm suction.  2. BP well controlled, NSR 80s 3. Renal-creatinine 1.12 on  8/12  Plan: Continue chest tube to suction, change to 20cm. Continue weaning oxygen as tolerated.  Continue medical care per primary.    LOS: 8 days    [name] 04/26/2017  Suction to -20 cm  Still with air leak , some increase of right ptx but xray done in xray dept off suction  I have seen and examined [name] and agree with the above assessment  and plan.  Delight Ovens MD [facility] [phone] Office [phone] 04/26/2017 11:27 AM

## 2017-04-26 NOTE — Progress Notes (Signed)
PROGRESS NOTE    [name]   [name]  DOB: 03-30-1951  DOA: 04/18/2017 PCP: Pearline Cables, MD   Brief Narrative:   66 y.o. female   pulmonary sarcoid diagnosed 1996  On chronic steroids Quit smoking 1980 Chronic respiratory failure on oxygen since 2015 Grade 1 diastolic heart failure Chronic kidney disease stage I Reflux    Just tapered off of prednisone completely on July 15th.  Followed by Dr. Vassie Loll.  Patient admitted via emergency room 8/11  EMS put her on CPAP, given Albuterol and Atrovent, hypoxic in 80s, CXR with large right sided pneumothorax and collapse of the lung.   Pulmonology consulted in the emergency room Chest tube but secondary to persistent air leak, cardiovascular surgery consulted regarding placing a larger tube  Doing fair  Subjective:  Sitting up in bed, pleasant  chest pain from tube ~7-8 but controllable coughing some No n/v  Assessment & Plan:  Pneumothorax on right Significant air leak necessitating placement of a larger bore chest tube by CVTA 8.16 We will place on cough medication and probably might need something for anxiety-has started 0.25-0.5 Ativan every 6 when necessary Much appreciated input from pulmonology and CVTS Pain control with 2-4 mg morphine every 4 when necessary for severe pain, Percocet 1 tab every 4 when necessary moderate pain  Per CVTS-no current need for B-P repair?--decreasing suction today    PULMONARY SARCOIDOSIS   Chronic respiratory failure with hypoxia   Nebs per pulmonary Continue Pulmicort 0.5 twice a day, Atrovent 0.5 3 times a day, Xopenex every 6 when necessary as well as scheduled, continue Brovana 15 g twice a day For cough continue Tessalon 100 3 times a day when necessary,x 5 mL every 12  Obesity Body mass index is 36.24 kg/m.  Grade 1 diastolic heart failure, compensated  Holding prior to admission Lasix 20 when necessary swelling  Resume on discharge  Bipolar  Resume  Elavil this evening 10 mg,  Continue bupropion XL 300 daily, Ativan as above  Reflux   Continue Pepcid 20 twice a day, Protonix 40 daily   DVT prophylaxis: lovenox Code Status: full code Family Communication:  Disposition Plan: home when able to remove chest tube--suspect will be 3-5 more days  Consultants:   Pulmonary  CVT's  Procedures:   Chest tube  Antimicrobials:  Anti-infectives    Start     Dose/Rate Route Frequency Ordered Stop   04/23/17 1230  vancomycin (VANCOCIN) IVPB 1000 mg/200 mL premix     1,000 mg 200 mL/hr over 60 Minutes Intravenous  Once 04/22/17 2043 04/23/17 1714       Objective: Vitals:   04/25/17 2046 04/26/17 0440 04/26/17 0725 04/26/17 0726  BP:  98/65    Pulse:  85    Resp:  14    Temp:  98.3 F (36.8 C)    TempSrc:      SpO2: 94% 96% 96% 99%  Weight:  98.8 kg (217 lb 12.8 oz)    Height:        Intake/Output Summary (Last 24 hours) at 04/26/17 1234 Last data filed at 04/26/17 1008  Gross per 24 hour  Intake           856.33 ml  Output             1180 ml  Net          -323.67 ml   Filed Weights   04/24/17 0329 04/25/17 0450 04/26/17 0440  Weight: 99.7 kg (  219 lb 12.8 oz) 99.2 kg (218 lb 11.2 oz) 98.8 kg (217 lb 12.8 oz)    Examination:  eomi nca Tin nad  Pain at site of tubing mainly Chest shows hyperresonnant R post percussion note abd soft nt nd no rebound Neuro intact  Data Reviewed: I have personally reviewed following labs and imaging studies  CBC: No results for input(s): WBC, NEUTROABS, HGB, HCT, MCV, PLT in the last 168 hours. Basic Metabolic Panel: No results for input(s): NA, K, CL, CO2, GLUCOSE, BUN, CREATININE, CALCIUM, MG, PHOS in the last 168 hours. GFR: Estimated Creatinine Clearance: 55.9 mL/min (A) (by C-G formula based on SCr of 1.12 mg/dL (H)). Liver Function Tests: No results for input(s): AST, ALT, ALKPHOS, BILITOT, PROT, ALBUMIN in the last 168 hours. No results for input(s): LIPASE, AMYLASE in  the last 168 hours. No results for input(s): AMMONIA in the last 168 hours. Coagulation Profile: No results for input(s): INR, PROTIME in the last 168 hours. Cardiac Enzymes: No results for input(s): CKTOTAL, CKMB, CKMBINDEX, TROPONINI in the last 168 hours. BNP (last 3 results) No results for input(s): PROBNP in the last 8760 hours. HbA1C: No results for input(s): HGBA1C in the last 72 hours. CBG: No results for input(s): GLUCAP in the last 168 hours. Lipid Profile: No results for input(s): CHOL, HDL, LDLCALC, TRIG, CHOLHDL, LDLDIRECT in the last 72 hours. Thyroid Function Tests: No results for input(s): TSH, T4TOTAL, FREET4, T3FREE, THYROIDAB in the last 72 hours. Anemia Panel: No results for input(s): VITAMINB12, FOLATE, FERRITIN, TIBC, IRON, RETICCTPCT in the last 72 hours. Urine analysis:    Component Value Date/Time   BILIRUBINUR Negaitve 03/20/2016 1158   PROTEINUR negative 03/20/2016 1158   UROBILINOGEN negative 03/20/2016 1158   NITRITE negative 03/20/2016 1158   LEUKOCYTESUR Negative 03/20/2016 1158    Radiology Studies: Dg Chest 2 View  Result Date: 04/26/2017 CLINICAL DATA:  Pt admitted for pneumothorax. Pt has chest tube in right lung. Hx of asthma, barrett esophagus and pulmonary sarcoidosis. EXAM: CHEST  2 VIEW COMPARISON:  04/25/2017 FINDINGS: Moderate right pneumothorax, which appears mildly increased in size from the previous day's study. There is extensive subcutaneous emphysema mostly on the right, without change. Right chest tube tip projects at the right apex, also stable. Lungs show irregular interstitial thickening bilaterally consistent with fibrosis, stable. No left pneumothorax. IMPRESSION: 1. Mild increase in the size of the right pneumothorax since the previous day's study. 2. No other change. Electronically Signed   By: Amie Portland M.D.   On: 04/26/2017 10:45   Dg Chest Port 1 View  Result Date: 04/25/2017 CLINICAL DATA:  Right-sided chest tube EXAM:  PORTABLE CHEST 1 VIEW COMPARISON:  04/24/2017 FINDINGS: Bilateral diffuse interstitial thickening. Right-sided chest tube directed towards the apex. Right apical pneumothorax measuring 2 cm at the apex similar in appearance to the prior exam. No left pneumothorax. No pleural effusion. Stable cardiomediastinal silhouette. Extensive soft tissue emphysema along the right lateral chest wall and the right and left side of the neck. IMPRESSION: 1. Right-sided chest tube directed towards the apex. Right apical pneumothorax measuring 2 cm at the apex similar in appearance to the prior exam. 2. Chronic interstitial lung disease. Electronically Signed   By: Elige Ko   On: 04/25/2017 08:14   Scheduled Meds: . arformoterol  15 mcg Nebulization BID  . budesonide (PULMICORT) nebulizer solution  0.5 mg Nebulization BID  . buPROPion  300 mg Oral Daily  . chlorpheniramine-HYDROcodone  5 mL Oral Q12H  .  cycloSPORINE  1 drop Both Eyes BID  . enoxaparin (LOVENOX) injection  40 mg Subcutaneous Q24H  . famotidine  20 mg Oral BID  . fluticasone  1 spray Each Nare TID  . lidocaine  1 patch Transdermal Q24H  . loratadine  10 mg Oral Daily  . multivitamin with minerals  1 tablet Oral Daily  . pantoprazole  40 mg Oral Daily   Continuous Infusions: . lactated ringers 10 mL/hr at 04/25/17 1500     LOS: 8 days    Time spent in minutes: 25  Pleas Koch, MD [facility] [phone]

## 2017-04-26 NOTE — Plan of Care (Signed)
Problem: Bowel/Gastric: Goal: Will not experience complications related to bowel motility Outcome: Not Progressing Last BM on 8/13

## 2017-04-27 ENCOUNTER — Inpatient Hospital Stay (HOSPITAL_COMMUNITY): Payer: Medicare Other

## 2017-04-27 MED ORDER — MUPIROCIN 2 % EX OINT
1.0000 "application " | TOPICAL_OINTMENT | Freq: Two times a day (BID) | CUTANEOUS | Status: AC
  Administered 2017-04-27 – 2017-05-01 (×10): 1 via NASAL
  Filled 2017-04-27 (×3): qty 22

## 2017-04-27 MED ORDER — CHLORHEXIDINE GLUCONATE CLOTH 2 % EX PADS
6.0000 | MEDICATED_PAD | Freq: Every day | CUTANEOUS | Status: AC
  Administered 2017-04-27 – 2017-05-01 (×5): 6 via TOPICAL

## 2017-04-27 MED ORDER — POLYETHYLENE GLYCOL 3350 17 G PO PACK
17.0000 g | PACK | Freq: Every day | ORAL | Status: DC
  Administered 2017-04-27 – 2017-05-09 (×13): 17 g via ORAL
  Filled 2017-04-27 (×16): qty 1

## 2017-04-27 NOTE — Progress Notes (Signed)
PROGRESS NOTE    [name]   [name]  DOB: 03/11/1951  DOA: 04/18/2017 PCP: Pearline Cables, MD   Brief Narrative:   66 y.o. female   pulmonary sarcoid diagnosed 1996  On chronic steroids Quit smoking 1980 Chronic respiratory failure on oxygen since 2015 Grade 1 diastolic heart failure Chronic kidney disease stage I Reflux    Just tapered off of prednisone completely on July 15th.  Followed by Dr. Vassie Loll.  Patient admitted via emergency room 8/11  EMS put her on CPAP, given Albuterol and Atrovent, hypoxic in 80s, CXR with large right sided pneumothorax and collapse of the lung.   Pulmonology consulted in the emergency room Chest tube but secondary to persistent air leak, cardiovascular surgery consulted regarding placing a larger tube  Doing fair  Subjective:  Pain on moving No stool recently--needs enema she thinks No cough No fever  Assessment & Plan:  Pneumothorax on right Significant air leak necessitating placement of a larger bore chest tube by CVTA 8.16  for anxiety-has started 0.25-0.5 Ativan every 6 when necessary Much appreciated input from pulmonology and CVTS Pain control with 2-4 mg morphine every 4 when necessary for severe pain, Percocet 1 tab every 4 when necessary moderate pain  Per CVTS further management    PULMONARY SARCOIDOSIS   Chronic respiratory failure with hypoxia   Nebs per pulmonary Continue Pulmicort 0.5 twice a day, Atrovent 0.5 3 times a day, Xopenex every 6 when necessary as well as scheduled, continue Brovana 15 g twice a day For cough continue Tessalon 100 3 times a day   Obesity Body mass index is 36.34 kg/m.  Grade 1 diastolic heart failure, compensated  Holding prior to admission Lasix 20 when necessary swelling  Resume on discharge  Bipolar  Elavil qpm 10 mg,  Continue bupropion XL 300 daily, Ativan as above  Reflux   Continue Pepcid 20 twice a day, Protonix 40 daily   DVT prophylaxis:  lovenox Code Status: full code Family Communication:  Disposition Plan: home when able to remove chest tube--suspect will be 3-5 more days  Consultants:   Pulmonary  CVT's  Procedures:   Chest tube  Antimicrobials:  Anti-infectives    Start     Dose/Rate Route Frequency Ordered Stop   04/23/17 1230  vancomycin (VANCOCIN) IVPB 1000 mg/200 mL premix     1,000 mg 200 mL/hr over 60 Minutes Intravenous  Once 04/22/17 2043 04/23/17 1714       Objective: Vitals:   04/26/17 2041 04/27/17 0509 04/27/17 0853 04/27/17 1500  BP:  111/61 111/61 129/77  Pulse:  83 92   Resp:  18 (!) 22 20  Temp:  98 F (36.7 C)  98 F (36.7 C)  TempSrc:  Oral  Axillary  SpO2: 97% 95% 100%   Weight:  99.1 kg (218 lb 6.4 oz)    Height:        Intake/Output Summary (Last 24 hours) at 04/27/17 1510 Last data filed at 04/27/17 1500  Gross per 24 hour  Intake           470.67 ml  Output             1580 ml  Net         -1109.33 ml   Filed Weights   04/25/17 0450 04/26/17 0440 04/27/17 0509  Weight: 99.2 kg (218 lb 11.2 oz) 98.8 kg (217 lb 12.8 oz) 99.1 kg (218 lb 6.4 oz)    Examination:  Alert pleasant  in nad Pain still + No wheeze no rales, AE decreased R side post abd soft Neuro intact  Data Reviewed: I have personally reviewed following labs and imaging studies  CBC: No results for input(s): WBC, NEUTROABS, HGB, HCT, MCV, PLT in the last 168 hours. Basic Metabolic Panel: No results for input(s): NA, K, CL, CO2, GLUCOSE, BUN, CREATININE, CALCIUM, MG, PHOS in the last 168 hours. GFR: Estimated Creatinine Clearance: 56 mL/min (A) (by C-G formula based on SCr of 1.12 mg/dL (H)). Liver Function Tests: No results for input(s): AST, ALT, ALKPHOS, BILITOT, PROT, ALBUMIN in the last 168 hours. No results for input(s): LIPASE, AMYLASE in the last 168 hours. No results for input(s): AMMONIA in the last 168 hours. Coagulation Profile: No results for input(s): INR, PROTIME in the last 168  hours. Cardiac Enzymes: No results for input(s): CKTOTAL, CKMB, CKMBINDEX, TROPONINI in the last 168 hours. BNP (last 3 results) No results for input(s): PROBNP in the last 8760 hours. HbA1C: No results for input(s): HGBA1C in the last 72 hours. CBG: No results for input(s): GLUCAP in the last 168 hours. Lipid Profile: No results for input(s): CHOL, HDL, LDLCALC, TRIG, CHOLHDL, LDLDIRECT in the last 72 hours. Thyroid Function Tests: No results for input(s): TSH, T4TOTAL, FREET4, T3FREE, THYROIDAB in the last 72 hours. Anemia Panel: No results for input(s): VITAMINB12, FOLATE, FERRITIN, TIBC, IRON, RETICCTPCT in the last 72 hours. Urine analysis:    Component Value Date/Time   BILIRUBINUR Negaitve 03/20/2016 1158   PROTEINUR negative 03/20/2016 1158   UROBILINOGEN negative 03/20/2016 1158   NITRITE negative 03/20/2016 1158   LEUKOCYTESUR Negative 03/20/2016 1158    Radiology Studies: Dg Chest 2 View  Result Date: 04/26/2017 CLINICAL DATA:  Pt admitted for pneumothorax. Pt has chest tube in right lung. Hx of asthma, barrett esophagus and pulmonary sarcoidosis. EXAM: CHEST  2 VIEW COMPARISON:  04/25/2017 FINDINGS: Moderate right pneumothorax, which appears mildly increased in size from the previous day's study. There is extensive subcutaneous emphysema mostly on the right, without change. Right chest tube tip projects at the right apex, also stable. Lungs show irregular interstitial thickening bilaterally consistent with fibrosis, stable. No left pneumothorax. IMPRESSION: 1. Mild increase in the size of the right pneumothorax since the previous day's study. 2. No other change. Electronically Signed   By: Amie Portland M.D.   On: 04/26/2017 10:45   Dg Chest Port 1 View  Result Date: 04/27/2017 CLINICAL DATA:  Chest pain and shortness of breath. Chest tube treatment for right-sided pneumothorax. EXAM: PORTABLE CHEST 1 VIEW COMPARISON:  Portable chest x-ray of April 23, 2017 and [place] and  lateral chest x-ray of April 26, 2017 FINDINGS: The lungs are well-expanded. The interstitial markings remain increased. There is a persistent right-sided pneumo pneumothorax with the pleural line noted along a portion of the course of the chest tube it does not appear to of increased in size since yesterday's study. There is no mediastinal shift. There is no significant pleural effusion. The heart is normal in size. There is right hilar prominence that is unchanged. There is extensive subcutaneous emphysema greatest on the right but also of the base of the left neck. IMPRESSION: Persistent right-sided pneumothorax amounting to approximately 30% of the lung volume. No mediastinal shift. The right chest tube tip remains in the pulmonary apex. Persistently increased interstitial markings likely reflecting known sarcoidosis. Electronically Signed   By: David  [place] M.D.   On: 04/27/2017 07:39   Scheduled Meds: . arformoterol  15 mcg  Nebulization BID  . budesonide (PULMICORT) nebulizer solution  0.5 mg Nebulization BID  . buPROPion  300 mg Oral Daily  . Chlorhexidine Gluconate Cloth  6 each Topical Daily  . chlorpheniramine-HYDROcodone  5 mL Oral Q12H  . cycloSPORINE  1 drop Both Eyes BID  . enoxaparin (LOVENOX) injection  40 mg Subcutaneous Q24H  . famotidine  20 mg Oral BID  . fluticasone  1 spray Each Nare TID  . lidocaine  1 patch Transdermal Q24H  . loratadine  10 mg Oral Daily  . multivitamin with minerals  1 tablet Oral Daily  . mupirocin ointment  1 application Nasal BID  . pantoprazole  40 mg Oral Daily  . polyethylene glycol  17 g Oral Daily   Continuous Infusions: . lactated ringers 10 mL/hr at 04/26/17 2000     LOS: 9 days    Time spent in minutes: 25  Pleas Koch, MD [facility] [phone]

## 2017-04-27 NOTE — Care Management Important Message (Signed)
Important Message  Patient Details  Name: MARIAGUADALUPE MILLES MRN: [id number] Date of Birth: 1951-08-11   Medicare Important Message Given:  Yes    Kyla Balzarine 04/27/2017, 11:24 AM

## 2017-04-27 NOTE — Progress Notes (Addendum)
      [address]       [place] [address]             [phone]       4 Days Post-Op Procedure(s) (LRB): CHEST TUBE INSERTION (Right)  Subjective: Patient without specific complaints this am.  Objective: Vital signs in last 24 hours: Temp:  [98 F (36.7 C)-98.9 F (37.2 C)] 98 F (36.7 C) (08/20 0509) Pulse Rate:  [83-104] 92 (08/20 0853) Cardiac Rhythm: Normal sinus rhythm (08/20 1001) Resp:  [18-26] 22 (08/20 0853) BP: (110-125)/(61-76) 111/61 (08/20 0853) SpO2:  [95 %-100 %] 100 % (08/20 0853) Weight:  [99.1 kg (218 lb 6.4 oz)] 99.1 kg (218 lb 6.4 oz) (08/20 0509)     Intake/Output from previous day: 08/19 0701 - 08/20 0700 In: 395 [P.O.:25; I.V.:370] Out: 1150 [Urine:1150]   Physical Exam:  Cardiovascular: RRR Pulmonary: Clear to auscultation  Extremities: Mild bilateral lower extremity edema. Wounds: Dressing is clean and dry.   Chest Tube: to water seal, air leak that worsens with cough  Lab Results: CBC:No results for input(s): WBC, HGB, HCT, PLT in the last 72 hours. BMET: No results for input(s): NA, K, CL, CO2, GLUCOSE, BUN, CREATININE, CALCIUM in the last 72 hours.  PT/INR: No results for input(s): LABPROT, INR in the last 72 hours. ABG:  INR: Will add last result for INR, ABG once components are confirmed Will add last 4 CBG results once components are confirmed  Assessment/Plan:  1. CV - SR, first degree heart block in the 90's this am. 2.  Pulmonary - Chest tube with 30 cc last 24 hours. Chest tube is to and there is an air leak that worsens with cough. Of note, chest tube is to suction but not properly as orange cylinder weas not floating. Nurses instructed how to do proper suction and fixed accordingly. On 1.5 liters of oxygen via [place]. Patient states she was on 2 liters via [place] at home prior to admission. CXR this am shows right pneumothorax, subcutaneous emphysema right lateral chest wall, and persistently increased  interstitial markings likely reflecting known sarcoidosis. Will discuss with Dr. Donata Clay regarding possible TALC placement.   ZIMMERMAN,DONIELLE MPA-C 04/27/2017,10:21 AM  Suction reduced to 20 cm H20 Air leak will need to improve before talc will benefit the patient Sarcoid lung disease makes treatment of spontaneous pntx very difficult, she would not tolerate VATS  patient examined and medical record reviewed,agree with above note. [name] 04/27/2017

## 2017-04-27 NOTE — Care Management Note (Addendum)
Case Management Note  Patient Details  Name: [name] MRN: [id number] Date of Birth: 13-Dec-1950  Subjective/Objective:    Pt admitted on 04/18/17 with pulmonary sarcoidosis and spontaneous PTX.  PTA, pt independent, resides at home with spouse.  She is on home oxygen at 2L/[place], provided by [employer].                 Action/Plan: Will follow for discharge planning as pt progresses.  Recommend PT/OT consults when able to tolerate therapies.    Expected Discharge Date:                 Expected Discharge Plan:  Home w Home Health Services  In-House Referral:     Discharge planning Services  CM Consult  Post Acute Care Choice:    Choice offered to:     DME Arranged:    DME Agency:     HH Arranged:    HH Agency:     Status of Service:  In process, will continue to follow  If discussed at Long Length of Stay Meetings, dates discussed:    Additional Comments:  Glennon Mac, RN 04/27/2017, 3:04 PM

## 2017-04-27 NOTE — Progress Notes (Signed)
RN noted change in suction. RN paged Dr. Donata Clay and he stated that he changed suction to 10. Will continue to monitor.

## 2017-04-28 ENCOUNTER — Inpatient Hospital Stay (HOSPITAL_COMMUNITY): Payer: Medicare Other

## 2017-04-28 DIAGNOSIS — J9311 Primary spontaneous pneumothorax: Secondary | ICD-10-CM

## 2017-04-28 MED ORDER — SENNOSIDES-DOCUSATE SODIUM 8.6-50 MG PO TABS
1.0000 | ORAL_TABLET | Freq: Two times a day (BID) | ORAL | Status: DC
  Administered 2017-04-28 – 2017-05-11 (×26): 1 via ORAL
  Filled 2017-04-28 (×28): qty 1

## 2017-04-28 NOTE — Plan of Care (Signed)
Problem: Bowel/Gastric: Goal: Will not experience complications related to bowel motility Outcome: Not Progressing Pt has not had BM since 8/13

## 2017-04-28 NOTE — Progress Notes (Signed)
PROGRESS NOTE    SANIKA BROSIOUS   [name]  DOB: 1951-06-13  DOA: 04/18/2017 PCP: Pearline Cables, MD   Brief Narrative:   66 y.o. female   pulmonary sarcoid diagnosed 1996  On chronic steroids Quit smoking 1980 Chronic respiratory failure on oxygen since 2015 Grade 1 diastolic heart failure Chronic kidney disease stage I Reflux    Just tapered off of prednisone completely on July 15th.  Followed by Dr. Vassie Loll.  Patient admitted via emergency room 8/11  EMS put her on CPAP, given Albuterol and Atrovent, hypoxic in 80s, CXR with large right sided pneumothorax and collapse of the lung.   Pulmonology consulted in the emergency room Chest tube but secondary to persistent air leak, cardiovascular surgery consulted regarding placing a larger tube awaitng ? Pleurodesis  Subjective:  Pain with mving consitpated passin gas--no stool yet No fever no chills  Assessment & Plan:  Pneumothorax on right Significant air leak necessitating placement of a larger bore chest tube by CVTA 8.16  for anxiety-has started 0.25-0.5 Ativan every 6 when necessary Pain control with 2-4 mg morphine every 4 when necessary for severe pain, Percocet 1 tab every 4 when necessary moderate pain CXR showing some decrease in PTX and decrease in air leak 8/21  Per CVTS further management--sounds like Talc pleurodesis at some point this week    PULMONARY SARCOIDOSIS   Chronic respiratory failure with hypoxia   Nebs per pulmonary Continue Pulmicort 0.5 twice a day, Atrovent 0.5 3 times a day, Xopenex every 6 when necessary as well as scheduled, continue Brovana 15 g twice a day For cough continue Tessalon 100 3 times a day   Obesity Body mass index is 36.06 kg/m.  Grade 1 diastolic heart failure, compensated  Holding prior to admission Lasix 20 when necessary swelling  Resume on discharge  Bipolar  Elavil qpm 10 mg,  Continue bupropion XL 300 daily, Ativan as above  Reflux   Continue  Pepcid 20 twice a day, Protonix 40 daily   DVT prophylaxis: lovenox Code Status: full code Family Communication:  Disposition Plan: home when able to remove chest tube--suspect will be 3-5 more days  Consultants:   Pulmonary  CVT's  Procedures:   Chest tube  Antimicrobials:  Anti-infectives    Start     Dose/Rate Route Frequency Ordered Stop   04/23/17 1230  vancomycin (VANCOCIN) IVPB 1000 mg/200 mL premix     1,000 mg 200 mL/hr over 60 Minutes Intravenous  Once 04/22/17 2043 04/23/17 1714       Objective: Vitals:   04/27/17 1500 04/27/17 1943 04/28/17 0324 04/28/17 0901  BP: 129/77 132/84 121/77   Pulse:   (!) 108 98  Resp: 20 (!) 23 (!) 27 (!) 24  Temp: 98 F (36.7 C) 98.5 F (36.9 C) (!) 97.3 F (36.3 C)   TempSrc: Axillary     SpO2:  100% 91% 96%  Weight:   98.3 kg (216 lb 11.2 oz)   Height:        Intake/Output Summary (Last 24 hours) at 04/28/17 1432 Last data filed at 04/28/17 0900  Gross per 24 hour  Intake              390 ml  Output             1320 ml  Net             -930 ml   Filed Weights   04/26/17 0440 04/27/17 0509 04/28/17 0324  Weight: 98.8 kg (217 lb 12.8 oz) 99.1 kg (218 lb 6.4 oz) 98.3 kg (216 lb 11.2 oz)    Examination:  Alert pleasant in nad Pain still + No wheeze no rales, AE decreased R side post abd soft Neuro intact  Data Reviewed: I have personally reviewed following labs and imaging studies  CBC: No results for input(s): WBC, NEUTROABS, HGB, HCT, MCV, PLT in the last 168 hours. Basic Metabolic Panel: No results for input(s): NA, K, CL, CO2, GLUCOSE, BUN, CREATININE, CALCIUM, MG, PHOS in the last 168 hours. GFR: Estimated Creatinine Clearance: 55.8 mL/min (A) (by C-G formula based on SCr of 1.12 mg/dL (H)). Liver Function Tests: No results for input(s): AST, ALT, ALKPHOS, BILITOT, PROT, ALBUMIN in the last 168 hours. No results for input(s): LIPASE, AMYLASE in the last 168 hours. No results for input(s): AMMONIA in  the last 168 hours. Coagulation Profile: No results for input(s): INR, PROTIME in the last 168 hours. Cardiac Enzymes: No results for input(s): CKTOTAL, CKMB, CKMBINDEX, TROPONINI in the last 168 hours. BNP (last 3 results) No results for input(s): PROBNP in the last 8760 hours. HbA1C: No results for input(s): HGBA1C in the last 72 hours. CBG: No results for input(s): GLUCAP in the last 168 hours. Lipid Profile: No results for input(s): CHOL, HDL, LDLCALC, TRIG, CHOLHDL, LDLDIRECT in the last 72 hours. Thyroid Function Tests: No results for input(s): TSH, T4TOTAL, FREET4, T3FREE, THYROIDAB in the last 72 hours. Anemia Panel: No results for input(s): VITAMINB12, FOLATE, FERRITIN, TIBC, IRON, RETICCTPCT in the last 72 hours. Urine analysis:    Component Value Date/Time   BILIRUBINUR Negaitve 03/20/2016 1158   PROTEINUR negative 03/20/2016 1158   UROBILINOGEN negative 03/20/2016 1158   NITRITE negative 03/20/2016 1158   LEUKOCYTESUR Negative 03/20/2016 1158    Radiology Studies: Dg Chest Port 1 View  Result Date: 04/28/2017 CLINICAL DATA:  Right-sided chest tube EXAM: PORTABLE CHEST 1 VIEW COMPARISON:  04/27/2017 FINDINGS: Cardiac shadow is stable. Persistent right apical pneumothorax is noted but slightly decreased when compared with the prior exam. Right-sided chest tube remains in place. Increased interstitial markings are again noted and stable. No new focal abnormality is seen. IMPRESSION: Improving right pneumothorax.  The remainder of the exam is stable. Electronically Signed   By: Alcide Clever M.D.   On: 04/28/2017 07:45   Dg Chest Port 1 View  Result Date: 04/27/2017 CLINICAL DATA:  Chest pain and shortness of breath. Chest tube treatment for right-sided pneumothorax. EXAM: PORTABLE CHEST 1 VIEW COMPARISON:  Portable chest x-ray of April 23, 2017 and [place] and lateral chest x-ray of April 26, 2017 FINDINGS: The lungs are well-expanded. The interstitial markings remain  increased. There is a persistent right-sided pneumo pneumothorax with the pleural line noted along a portion of the course of the chest tube it does not appear to of increased in size since yesterday's study. There is no mediastinal shift. There is no significant pleural effusion. The heart is normal in size. There is right hilar prominence that is unchanged. There is extensive subcutaneous emphysema greatest on the right but also of the base of the left neck. IMPRESSION: Persistent right-sided pneumothorax amounting to approximately 30% of the lung volume. No mediastinal shift. The right chest tube tip remains in the pulmonary apex. Persistently increased interstitial markings likely reflecting known sarcoidosis. Electronically Signed   By: David  [place] M.D.   On: 04/27/2017 07:39   Scheduled Meds: . arformoterol  15 mcg Nebulization BID  . budesonide (PULMICORT) nebulizer  solution  0.5 mg Nebulization BID  . buPROPion  300 mg Oral Daily  . Chlorhexidine Gluconate Cloth  6 each Topical Daily  . chlorpheniramine-HYDROcodone  5 mL Oral Q12H  . cycloSPORINE  1 drop Both Eyes BID  . enoxaparin (LOVENOX) injection  40 mg Subcutaneous Q24H  . famotidine  20 mg Oral BID  . fluticasone  1 spray Each Nare TID  . lidocaine  1 patch Transdermal Q24H  . loratadine  10 mg Oral Daily  . multivitamin with minerals  1 tablet Oral Daily  . mupirocin ointment  1 application Nasal BID  . pantoprazole  40 mg Oral Daily  . polyethylene glycol  17 g Oral Daily  . senna-docusate  1 tablet Oral BID   Continuous Infusions:    LOS: 10 days    Time spent in minutes: 25  Pleas Koch, MD [facility] [phone]

## 2017-04-28 NOTE — Plan of Care (Signed)
Problem: Pain Managment: Goal: General experience of comfort will improve Outcome: Not Progressing Pt receiving PO pain meds for pain

## 2017-04-28 NOTE — Progress Notes (Addendum)
[address]       [name] [address]             [phone]      5 Days Post-Op Procedure(s) (LRB): CHEST TUBE INSERTION (Right) Subjective: Feels ok, not SOB, some constipation, not SOB  Objective: Vital signs in last 24 hours: Temp:  [97.3 F (36.3 C)-98.5 F (36.9 C)] 97.3 F (36.3 C) (08/21 0324) Pulse Rate:  [92-108] 108 (08/21 0324) Cardiac Rhythm: Normal sinus rhythm (08/20 2200) Resp:  [20-27] 27 (08/21 0324) BP: (111-132)/(61-84) 121/77 (08/21 0324) SpO2:  [91 %-100 %] 91 % (08/21 0324) Weight:  [216 lb 11.2 oz (98.3 kg)] 216 lb 11.2 oz (98.3 kg) (08/21 0324)  Hemodynamic parameters for last 24 hours:    Intake/Output from previous day: 08/20 0701 - 08/21 0700 In: 442 [P.O.:372; I.V.:70] Out: 1730 [Urine:1600; Chest Tube:130] Intake/Output this shift: Total I/O In: -  Out: 70 [Chest Tube:70]  General appearance: alert, cooperative and no distress Heart: regular rate and rhythm Lungs: coarse BS on right Abdomen: benign Extremities: no edema or calf tenderness  Lab Results: No results for input(s): WBC, HGB, HCT, PLT in the last 72 hours. BMET: No results for input(s): NA, K, CL, CO2, GLUCOSE, BUN, CREATININE, CALCIUM in the last 72 hours.  PT/INR: No results for input(s): LABPROT, INR in the last 72 hours. ABG    Component Value Date/Time   HCO3 26.6 04/18/2017 1746   TCO2 28 04/18/2017 1746   O2SAT 92.0 04/18/2017 1746   CBG (last 3)  No results for input(s): GLUCAP in the last 72 hours.  Meds Scheduled Meds: . arformoterol  15 mcg Nebulization BID  . budesonide (PULMICORT) nebulizer solution  0.5 mg Nebulization BID  . buPROPion  300 mg Oral Daily  . Chlorhexidine Gluconate Cloth  6 each Topical Daily  . chlorpheniramine-HYDROcodone  5 mL Oral Q12H  . cycloSPORINE  1 drop Both Eyes BID  . enoxaparin (LOVENOX) injection  40 mg Subcutaneous Q24H  . famotidine  20 mg Oral BID  . fluticasone  1 spray Each Nare TID  .  lidocaine  1 patch Transdermal Q24H  . loratadine  10 mg Oral Daily  . multivitamin with minerals  1 tablet Oral Daily  . mupirocin ointment  1 application Nasal BID  . pantoprazole  40 mg Oral Daily  . polyethylene glycol  17 g Oral Daily   Continuous Infusions: PRN Meds:.acetaminophen **OR** acetaminophen, benzonatate, fentaNYL (SUBLIMAZE) injection, levalbuterol, LORazepam, morphine injection, ondansetron **OR** ondansetron (ZOFRAN) IV, oxyCODONE-acetaminophen  Xrays Dg Chest 2 View  Result Date: 04/26/2017 CLINICAL DATA:  Pt admitted for pneumothorax. Pt has chest tube in right lung. Hx of asthma, barrett esophagus and pulmonary sarcoidosis. EXAM: CHEST  2 VIEW COMPARISON:  04/25/2017 FINDINGS: Moderate right pneumothorax, which appears mildly increased in size from the previous day's study. There is extensive subcutaneous emphysema mostly on the right, without change. Right chest tube tip projects at the right apex, also stable. Lungs show irregular interstitial thickening bilaterally consistent with fibrosis, stable. No left pneumothorax. IMPRESSION: 1. Mild increase in the size of the right pneumothorax since the previous day's study. 2. No other change. Electronically Signed   By: Amie Portland M.D.   On: 04/26/2017 10:45   Dg Chest Port 1 View  Result Date: 04/28/2017 CLINICAL DATA:  Right-sided chest tube EXAM: PORTABLE CHEST 1 VIEW COMPARISON:  04/27/2017 FINDINGS: Cardiac shadow is stable. Persistent right apical pneumothorax is noted but  slightly decreased when compared with the prior exam. Right-sided chest tube remains in place. Increased interstitial markings are again noted and stable. No new focal abnormality is seen. IMPRESSION: Improving right pneumothorax.  The remainder of the exam is stable. Electronically Signed   By: Alcide Clever M.D.   On: 04/28/2017 07:45   Dg Chest Port 1 View  Result Date: 04/27/2017 CLINICAL DATA:  Chest pain and shortness of breath. Chest tube  treatment for right-sided pneumothorax. EXAM: PORTABLE CHEST 1 VIEW COMPARISON:  Portable chest x-ray of April 23, 2017 and [place] and lateral chest x-ray of April 26, 2017 FINDINGS: The lungs are well-expanded. The interstitial markings remain increased. There is a persistent right-sided pneumo pneumothorax with the pleural line noted along a portion of the course of the chest tube it does not appear to of increased in size since yesterday's study. There is no mediastinal shift. There is no significant pleural effusion. The heart is normal in size. There is right hilar prominence that is unchanged. There is extensive subcutaneous emphysema greatest on the right but also of the base of the left neck. IMPRESSION: Persistent right-sided pneumothorax amounting to approximately 30% of the lung volume. No mediastinal shift. The right chest tube tip remains in the pulmonary apex. Persistently increased interstitial markings likely reflecting known sarcoidosis. Electronically Signed   By: David  [place] M.D.   On: 04/27/2017 07:39   Chest tube: + air leak, 130 cc frainage yesterday   Assessment/Plan: S/P Procedure(s) (LRB): CHEST TUBE INSERTION (Right)  1 doing well, keep chest tube in place with air leak, CXR improved, may need talc eventually 2 no new labs 3 hemodyn stable   LOS: 10 days    GOLD,[name] 04/28/2017 Keep chest tube to water seal Follow daily chest x-ray after airleak shows improvement we'll plan talc pleurodesis through tube      Exam    General- alert and comfortable   Lungs- clear without rales, wheezes   Cor- regular rate and rhythm, no murmur , gallop   Abdomen- soft, non-tender   Extremities - warm, non-tender, minimal edema   Neuro- oriented, appropriate, no focal weakness

## 2017-04-29 ENCOUNTER — Inpatient Hospital Stay (HOSPITAL_COMMUNITY): Payer: Medicare Other

## 2017-04-29 LAB — BASIC METABOLIC PANEL
Anion gap: 9 (ref 5–15)
BUN: 7 mg/dL (ref 6–20)
CO2: 29 mmol/L (ref 22–32)
CREATININE: 1.03 mg/dL — AB (ref 0.44–1.00)
Calcium: 9.1 mg/dL (ref 8.9–10.3)
Chloride: 100 mmol/L — ABNORMAL LOW (ref 101–111)
GFR calc Af Amer: >60 mL/min (ref >60)
GFR, EST NON AFRICAN AMERICAN: 55 mL/min — AB (ref >60)
Glucose, Bld: 105 mg/dL — ABNORMAL HIGH (ref 65–99)
Potassium: 3.4 mmol/L — ABNORMAL LOW (ref 3.5–5.1)
SODIUM: 138 mmol/L (ref 135–145)

## 2017-04-29 LAB — CBC WITH DIFFERENTIAL/PLATELET
BASOS ABS: 0.2 10*3/uL — AB (ref 0.0–0.1)
Basophils Relative: 3 %
Eosinophils Absolute: 0.6 10*3/uL (ref 0.0–0.7)
Eosinophils Relative: 8 %
HCT: 37.3 % (ref 36.0–46.0)
Hemoglobin: 11.9 g/dL — ABNORMAL LOW (ref 12.0–15.0)
LYMPHS ABS: 1.9 10*3/uL (ref 0.7–4.0)
Lymphocytes Relative: 27 %
MCH: 29.2 pg (ref 26.0–34.0)
MCHC: 31.9 g/dL (ref 30.0–36.0)
MCV: 91.4 fL (ref 78.0–100.0)
MONO ABS: 0.8 10*3/uL (ref 0.1–1.0)
Monocytes Relative: 12 %
Neutro Abs: 3.4 10*3/uL (ref 1.7–7.7)
Neutrophils Relative %: 50 %
PLATELETS: 284 10*3/uL (ref 150–400)
RBC: 4.08 MIL/uL (ref 3.87–5.11)
RDW: 12.9 % (ref 11.5–15.5)
WBC: 6.9 10*3/uL (ref 4.0–10.5)

## 2017-04-29 MED ORDER — POTASSIUM CHLORIDE CRYS ER 20 MEQ PO TBCR
40.0000 meq | EXTENDED_RELEASE_TABLET | Freq: Once | ORAL | Status: AC
  Administered 2017-04-29: 40 meq via ORAL
  Filled 2017-04-29: qty 2

## 2017-04-29 MED ORDER — OXYCODONE-ACETAMINOPHEN 5-325 MG PO TABS: 1.0000 | ORAL_TABLET | ORAL | Status: DC

## 2017-04-29 MED ORDER — OXYCODONE-ACETAMINOPHEN 5-325 MG PO TABS
1.0000 | ORAL_TABLET | ORAL | Status: DC
  Administered 2017-04-29 – 2017-05-04 (×25): 1 via ORAL
  Filled 2017-04-29 (×25): qty 1

## 2017-04-29 MED ORDER — BISACODYL 10 MG RE SUPP
10.0000 mg | Freq: Every day | RECTAL | Status: DC
  Administered 2017-04-29 – 2017-05-07 (×6): 10 mg via RECTAL
  Filled 2017-04-29 (×10): qty 1

## 2017-04-29 NOTE — Progress Notes (Signed)
PROGRESS NOTE    WALTER MIN   WUJ:[id number]  DOB: Oct 03, 1950  DOA: 04/18/2017 PCP: Pearline Cables, MD   Brief Narrative:   Kiara Ketter Hyltonis a 66 y.o.femalewith medical history significant of pulmonary sarcoid. Just tapered off of prednisone completely on July 15th. Followed by Dr. Vassie Loll.  Patient presents to the ED with sudden onset of R sided CP and SOB. EMS put her on CPAP, given Albuterol and Atrovent, hypoxic in 80s, CXR with large right sided pneumothorax.    Subjective: Having pain a chest tube site which is new over 24 hrs. No other complaints. + constipation for about 9 days.  ROS: no complaints of nausea, vomiting, diarrhea, cough, dyspnea or dysuria. No other complaints.   Assessment & Plan:   Principal Problem:   Pneumothorax on right - with air leak- CT surgery and pulmonary managing-  Plans for talc via chest tube once air leak resolved - will change percocet to Q 4 routine rather than PRN for increasing pain- also has PRN Fentanyl -control cough with antitussives  Active Problems: Constipation - Dulcolax suppository, Enema if it is ineffective  PULMONARY SARCOIDOSIS/  Chronic respiratory failure with hypoxia   - Nebs per pulmonary   Obesity Body mass index is 37.02 kg/m.   DVT prophylaxis: Lovenox Code Status: Full code Family Communication:  Disposition Plan: home when stable Consultants:   Pulm  CT surgery Procedures:   Chest tube Antimicrobials:  Anti-infectives    Start     Dose/Rate Route Frequency Ordered Stop   04/23/17 1230  vancomycin (VANCOCIN) IVPB 1000 mg/200 mL premix     1,000 mg 200 mL/hr over 60 Minutes Intravenous  Once 04/22/17 2043 04/23/17 1714       Objective: Vitals:   04/29/17 0446 04/29/17 0831 04/29/17 0835 04/29/17 1300  BP: 99/82 116/80  128/80  Pulse: 94 91    Resp: 18 15    Temp: 98.1 F (36.7 C)  97.9 F (36.6 C) (!) 97.5 F (36.4 C)  TempSrc: Oral  Oral Oral  SpO2: 97% 97% 95%     Weight:      Height:        Intake/Output Summary (Last 24 hours) at 04/29/17 1501 Last data filed at 04/28/17 1553  Gross per 24 hour  Intake                0 ml  Output              660 ml  Net             -660 ml   Filed Weights   04/26/17 0440 04/27/17 0509 04/28/17 0324  Weight: 98.8 kg (217 lb 12.8 oz) 99.1 kg (218 lb 6.4 oz) 98.3 kg (216 lb 11.2 oz)    Examination: General exam: Appears comfortable  HEENT: PERRLA, oral mucosa moist, no sclera icterus or thrush Respiratory system: Clear to auscultation. Respiratory effort normal. Cardiovascular system: S1 & S2 heard, RRR.  No murmurs  Gastrointestinal system: Abdomen soft, non-tender, nondistended. Normal bowel sound. No organomegaly Central nervous system: Alert and oriented. No focal neurological deficits. Extremities: No cyanosis, clubbing or edema Skin: No rashes or ulcers Psychiatry:  Mood & affect appropriate.     Data Reviewed: I have personally reviewed following labs and imaging studies  CBC:  Recent Labs Lab 04/29/17 0359  WBC 6.9  NEUTROABS 3.4  HGB 11.9*  HCT 37.3  MCV 91.4  PLT 284   Basic Metabolic Panel:  Recent Labs Lab 04/29/17 0359  NA 138  K 3.4*  CL 100*  CO2 29  GLUCOSE 105*  BUN 7  CREATININE 1.03*  CALCIUM 9.1   GFR: Estimated Creatinine Clearance: 60.7 mL/min (A) (by C-G formula based on SCr of 1.03 mg/dL (H)). Liver Function Tests: No results for input(s): AST, ALT, ALKPHOS, BILITOT, PROT, ALBUMIN in the last 168 hours. No results for input(s): LIPASE, AMYLASE in the last 168 hours. No results for input(s): AMMONIA in the last 168 hours. Coagulation Profile: No results for input(s): INR, PROTIME in the last 168 hours. Cardiac Enzymes: No results for input(s): CKTOTAL, CKMB, CKMBINDEX, TROPONINI in the last 168 hours. BNP (last 3 results) No results for input(s): PROBNP in the last 8760 hours. HbA1C: No results for input(s): HGBA1C in the last 72 hours. CBG: No  results for input(s): GLUCAP in the last 168 hours. Lipid Profile: No results for input(s): CHOL, HDL, LDLCALC, TRIG, CHOLHDL, LDLDIRECT in the last 72 hours. Thyroid Function Tests: No results for input(s): TSH, T4TOTAL, FREET4, T3FREE, THYROIDAB in the last 72 hours. Anemia Panel: No results for input(s): VITAMINB12, FOLATE, FERRITIN, TIBC, IRON, RETICCTPCT in the last 72 hours. Urine analysis:    Component Value Date/Time   BILIRUBINUR Negaitve 03/20/2016 1158   PROTEINUR negative 03/20/2016 1158   UROBILINOGEN negative 03/20/2016 1158   NITRITE negative 03/20/2016 1158   LEUKOCYTESUR Negative 03/20/2016 1158   Sepsis Labs: @LABRCNTIP (procalcitonin:4,lacticidven:4) ) Recent Results (from the past 240 hour(s))  Surgical PCR screen     Status: Abnormal   Collection Time: 04/23/17  4:20 AM  Result Value Ref Range Status   MRSA, PCR NEGATIVE NEGATIVE Final   Staphylococcus aureus POSITIVE (A) NEGATIVE Final    Comment:        The Xpert SA Assay (FDA approved for NASAL specimens in patients over 32 years of age), is one component of a comprehensive surveillance program.  Test performance has been validated by [facility] for patients greater than or equal to 16 year old. It is not intended to diagnose infection nor to guide or monitor treatment.          Radiology Studies: Dg Chest Port 1 View  Result Date: 04/28/2017 CLINICAL DATA:  Right-sided chest tube EXAM: PORTABLE CHEST 1 VIEW COMPARISON:  04/27/2017 FINDINGS: Cardiac shadow is stable. Persistent right apical pneumothorax is noted but slightly decreased when compared with the prior exam. Right-sided chest tube remains in place. Increased interstitial markings are again noted and stable. No new focal abnormality is seen. IMPRESSION: Improving right pneumothorax.  The remainder of the exam is stable. Electronically Signed   By: Alcide Clever M.D.   On: 04/28/2017 07:45      Scheduled Meds: . arformoterol  15 mcg  Nebulization BID  . bisacodyl  10 mg Rectal Daily  . budesonide (PULMICORT) nebulizer solution  0.5 mg Nebulization BID  . buPROPion  300 mg Oral Daily  . Chlorhexidine Gluconate Cloth  6 each Topical Daily  . chlorpheniramine-HYDROcodone  5 mL Oral Q12H  . cycloSPORINE  1 drop Both Eyes BID  . enoxaparin (LOVENOX) injection  40 mg Subcutaneous Q24H  . famotidine  20 mg Oral BID  . fluticasone  1 spray Each Nare TID  . lidocaine  1 patch Transdermal Q24H  . loratadine  10 mg Oral Daily  . multivitamin with minerals  1 tablet Oral Daily  . mupirocin ointment  1 application Nasal BID  . oxyCODONE-acetaminophen  1 tablet Oral Q4H  .  pantoprazole  40 mg Oral Daily  . polyethylene glycol  17 g Oral Daily  . senna-docusate  1 tablet Oral BID   Continuous Infusions:   LOS: 11 days    Time spent in minutes: 35    Calvert Cantor, MD [facility] Pager: [email or web address] Password [email or web address] 04/29/2017, 3:01 PM

## 2017-04-29 NOTE — Progress Notes (Addendum)
[address]       [place] [address]             [phone]      6 Days Post-Op Procedure(s) (LRB): CHEST TUBE INSERTION (Right) Subjective: Didn't sleep well d/t significant pain from tube site  Objective: Vital signs in last 24 hours: Temp:  [97.9 F (36.6 C)-98.1 F (36.7 C)] 98.1 F (36.7 C) (08/22 0446) Pulse Rate:  [94-98] 94 (08/22 0446) Cardiac Rhythm: Sinus tachycardia (08/21 2041) Resp:  [18-24] 18 (08/22 0446) BP: (99-124)/(81-82) 99/82 (08/22 0446) SpO2:  [95 %-97 %] 97 % (08/22 0446)  Hemodynamic parameters for last 24 hours:    Intake/Output from previous day: 08/21 0701 - 08/22 0700 In: 240 [P.O.:240] Out: 730 [Urine:600; Chest Tube:130] Intake/Output this shift: No intake/output data recorded.  General appearance: alert, cooperative and fatigued Heart: regular rate and rhythm Lungs: dim in right base Abdomen: benign Extremities: no edema or calf tenderness Wound: dressing CDI  Lab Results:  Recent Labs  04/29/17 0359  WBC 6.9  HGB 11.9*  HCT 37.3  PLT 284   BMET:  Recent Labs  04/29/17 0359  NA 138  K 3.4*  CL 100*  CO2 29  GLUCOSE 105*  BUN 7  CREATININE 1.03*  CALCIUM 9.1    PT/INR: No results for input(s): LABPROT, INR in the last 72 hours. ABG    Component Value Date/Time   HCO3 26.6 04/18/2017 1746   TCO2 28 04/18/2017 1746   O2SAT 92.0 04/18/2017 1746   CBG (last 3)  No results for input(s): GLUCAP in the last 72 hours.  Meds Scheduled Meds: . arformoterol  15 mcg Nebulization BID  . budesonide (PULMICORT) nebulizer solution  0.5 mg Nebulization BID  . buPROPion  300 mg Oral Daily  . Chlorhexidine Gluconate Cloth  6 each Topical Daily  . chlorpheniramine-HYDROcodone  5 mL Oral Q12H  . cycloSPORINE  1 drop Both Eyes BID  . enoxaparin (LOVENOX) injection  40 mg Subcutaneous Q24H  . famotidine  20 mg Oral BID  . fluticasone  1 spray Each Nare TID  . lidocaine  1 patch Transdermal Q24H    . loratadine  10 mg Oral Daily  . multivitamin with minerals  1 tablet Oral Daily  . mupirocin ointment  1 application Nasal BID  . pantoprazole  40 mg Oral Daily  . polyethylene glycol  17 g Oral Daily  . senna-docusate  1 tablet Oral BID   Continuous Infusions: PRN Meds:.acetaminophen **OR** acetaminophen, benzonatate, fentaNYL (SUBLIMAZE) injection, levalbuterol, LORazepam, morphine injection, ondansetron **OR** ondansetron (ZOFRAN) IV, oxyCODONE-acetaminophen  Xrays Dg Chest Port 1 View  Result Date: 04/28/2017 CLINICAL DATA:  Right-sided chest tube EXAM: PORTABLE CHEST 1 VIEW COMPARISON:  04/27/2017 FINDINGS: Cardiac shadow is stable. Persistent right apical pneumothorax is noted but slightly decreased when compared with the prior exam. Right-sided chest tube remains in place. Increased interstitial markings are again noted and stable. No new focal abnormality is seen. IMPRESSION: Improving right pneumothorax.  The remainder of the exam is stable. Electronically Signed   By: Alcide Clever M.D.   On: 04/28/2017 07:45    Assessment/Plan: S/P Procedure(s) (LRB): CHEST TUBE INSERTION (Right)  1 large air leak persists, CXR is currently pending, cont CT to current management 2 replace K+ 3 routine pulm toilet/rehab 4 talc when air leak much smaller  LOS: 11 days    GOLD,[name] 04/29/2017 patient examined and medical record reviewed,agree with above note.  [name] 04/30/2017

## 2017-04-29 NOTE — Plan of Care (Signed)
Problem: Pain Managment: Goal: General experience of comfort will improve Outcome: Progressing Pt transitioning to PO pain medication with IV morphine to be used for severe pain only. Pt tolerating PO pain meds at this time.

## 2017-04-30 ENCOUNTER — Inpatient Hospital Stay (HOSPITAL_COMMUNITY): Payer: Medicare Other

## 2017-04-30 DIAGNOSIS — K5903 Drug induced constipation: Secondary | ICD-10-CM

## 2017-04-30 MED ORDER — BISACODYL 10 MG RE SUPP: 10.0000 mg | Freq: Every day | RECTAL | Status: DC

## 2017-04-30 NOTE — Progress Notes (Signed)
PROGRESS NOTE    [name]   UPJ:[id number]  DOB: [birth year]  DOA: 04/18/2017 PCP: Pearline Cables, MD   Brief Narrative:   Kiara Winchell Hyltonis a 66 y.o.femalewith medical history significant of pulmonary sarcoid. Just tapered off of prednisone completely on July 15th. Followed by Dr. Vassie Loll.  Patient presents to the ED with sudden onset of R sided CP and SOB. EMS put her on CPAP, given Albuterol and Atrovent, hypoxic in 80s, CXR with large right sided pneumothorax.    Subjective: Pain is better controlled with adjustments which were made yesterday. Had small BM with dulcolax suppository.  ROS: no complaints of nausea, vomiting, diarrhea, cough, dyspnea or dysuria. No other complaints.   Assessment & Plan:   Principal Problem:   Pneumothorax on right - with air leak- CT surgery and pulmonary managing-  Plans for talc via chest tube once air leak resolved -  changed percocet to Q 4 routine rather than PRN for increasing pain- also has PRN Fentanyl -control cough with antitussives  Active Problems: Constipation - Dulcolax suppository daily for 3 more day, Enema if it is ineffective  PULMONARY SARCOIDOSIS/  Chronic respiratory failure with hypoxia   - Nebs per pulmonary   Obesity Body mass index is 37.02 kg/m.   DVT prophylaxis: Lovenox Code Status: Full code Family Communication:  Disposition Plan: home when stable Consultants:   Pulm  CT surgery Procedures:   Chest tube Antimicrobials:  Anti-infectives    Start     Dose/Rate Route Frequency Ordered Stop   04/23/17 1230  vancomycin (VANCOCIN) IVPB 1000 mg/200 mL premix     1,000 mg 200 mL/hr over 60 Minutes Intravenous  Once 04/22/17 2043 04/23/17 1714       Objective: Vitals:   04/30/17 0730 04/30/17 0854 04/30/17 0900 04/30/17 1100  BP: 104/72   102/70  Pulse: 92 99  (!) 103  Resp: 18 18  16   Temp: 98.4 F (36.9 C)   98.1 F (36.7 C)  TempSrc: Oral   Oral  SpO2: 92% 94% 95% 94%    Weight:      Height:        Intake/Output Summary (Last 24 hours) at 04/30/17 1353 Last data filed at 04/30/17 0600  Gross per 24 hour  Intake              600 ml  Output             1400 ml  Net             -800 ml   Filed Weights   04/27/17 0509 04/28/17 0324 04/30/17 0500  Weight: 99.1 kg (218 lb 6.4 oz) 98.3 kg (216 lb 11.2 oz) 101.4 kg (223 lb 8 oz)    Examination: General exam: Appears comfortable  HEENT: PERRLA, oral mucosa moist, no sclera icterus or thrush Respiratory system: Clear to auscultation. Respiratory effort normal. Cardiovascular system: S1 & S2 heard,  No murmurs  Gastrointestinal system: Abdomen soft, non-tender, nondistended. Normal bowel sound. No organomegaly Central nervous system: Alert and oriented. No focal neurological deficits. Extremities: No cyanosis, clubbing or edema Skin: No rashes or ulcers Psychiatry:  Mood & affect appropriate.   Data Reviewed: I have personally reviewed following labs and imaging studies  CBC:  Recent Labs Lab 04/29/17 0359  WBC 6.9  NEUTROABS 3.4  HGB 11.9*  HCT 37.3  MCV 91.4  PLT 284   Basic Metabolic Panel:  Recent Labs Lab 04/29/17 0359  NA 138  K 3.4*  CL 100*  CO2 29  GLUCOSE 105*  BUN 7  CREATININE 1.03*  CALCIUM 9.1   GFR: Estimated Creatinine Clearance: 61.7 mL/min (A) (by C-G formula based on SCr of 1.03 mg/dL (H)). Liver Function Tests: No results for input(s): AST, ALT, ALKPHOS, BILITOT, PROT, ALBUMIN in the last 168 hours. No results for input(s): LIPASE, AMYLASE in the last 168 hours. No results for input(s): AMMONIA in the last 168 hours. Coagulation Profile: No results for input(s): INR, PROTIME in the last 168 hours. Cardiac Enzymes: No results for input(s): CKTOTAL, CKMB, CKMBINDEX, TROPONINI in the last 168 hours. BNP (last 3 results) No results for input(s): PROBNP in the last 8760 hours. HbA1C: No results for input(s): HGBA1C in the last 72 hours. CBG: No results for  input(s): GLUCAP in the last 168 hours. Lipid Profile: No results for input(s): CHOL, HDL, LDLCALC, TRIG, CHOLHDL, LDLDIRECT in the last 72 hours. Thyroid Function Tests: No results for input(s): TSH, T4TOTAL, FREET4, T3FREE, THYROIDAB in the last 72 hours. Anemia Panel: No results for input(s): VITAMINB12, FOLATE, FERRITIN, TIBC, IRON, RETICCTPCT in the last 72 hours. Urine analysis:    Component Value Date/Time   BILIRUBINUR Negaitve 03/20/2016 1158   PROTEINUR negative 03/20/2016 1158   UROBILINOGEN negative 03/20/2016 1158   NITRITE negative 03/20/2016 1158   LEUKOCYTESUR Negative 03/20/2016 1158   Sepsis Labs: @LABRCNTIP (procalcitonin:4,lacticidven:4) ) Recent Results (from the past 240 hour(s))  Surgical PCR screen     Status: Abnormal   Collection Time: 04/23/17  4:20 AM  Result Value Ref Range Status   MRSA, PCR NEGATIVE NEGATIVE Final   Staphylococcus aureus POSITIVE (A) NEGATIVE Final    Comment:        The Xpert SA Assay (FDA approved for NASAL specimens in patients over 75 years of age), is one component of a comprehensive surveillance program.  Test performance has been validated by [facility] for patients greater than or equal to 58 year old. It is not intended to diagnose infection nor to guide or monitor treatment.          Radiology Studies: Dg Chest Port 1 View  Result Date: 04/30/2017 CLINICAL DATA:  History of pneumothorax, right chest tube, followup EXAM: PORTABLE CHEST 1 VIEW COMPARISON:  Portable chest x-ray of 04/29/2017 FINDINGS: The volume of the right pneumothorax has increased slightly, with no change in position of the right chest tube. Bibasilar pulmonary fibrotic change is again noted. Subcutaneous emphysema has not changed significantly. Heart size is stable. IMPRESSION: 1. Slight increase in volume of the right pneumothorax with no change in right chest tube. 2. No change in subcutaneous emphysema. Electronically Signed   By: Dwyane Dee M.D.   On: 04/30/2017 10:20   Dg Chest Port 1 View  Result Date: 04/29/2017 CLINICAL DATA:  Chest tube in place. EXAM: PORTABLE CHEST 1 VIEW COMPARISON:  Radiograph of April 28, 2017. FINDINGS: Stable cardiomediastinal silhouette. Right-sided chest tube is unchanged position, with mild right apical pneumothorax noted. Stable subcutaneous emphysema is seen over both supraclavicular regions and right lateral chest wall. Stable coarse interstitial densities are noted throughout both lungs concerning for edema or chronic interstitial lung disease. IMPRESSION: Mildly decreased right apical pneumothorax is noted. Stable subcutaneous emphysema is noted. Right-sided chest tube is unchanged in position. Electronically Signed   By: Lupita Raider, M.D.   On: 04/29/2017 18:36      Scheduled Meds: . arformoterol  15 mcg Nebulization BID  . bisacodyl  10 mg  Rectal Daily  . budesonide (PULMICORT) nebulizer solution  0.5 mg Nebulization BID  . buPROPion  300 mg Oral Daily  . Chlorhexidine Gluconate Cloth  6 each Topical Daily  . chlorpheniramine-HYDROcodone  5 mL Oral Q12H  . cycloSPORINE  1 drop Both Eyes BID  . enoxaparin (LOVENOX) injection  40 mg Subcutaneous Q24H  . famotidine  20 mg Oral BID  . fluticasone  1 spray Each Nare TID  . lidocaine  1 patch Transdermal Q24H  . loratadine  10 mg Oral Daily  . multivitamin with minerals  1 tablet Oral Daily  . mupirocin ointment  1 application Nasal BID  . oxyCODONE-acetaminophen  1 tablet Oral Q4H  . pantoprazole  40 mg Oral Daily  . polyethylene glycol  17 g Oral Daily  . senna-docusate  1 tablet Oral BID   Continuous Infusions:   LOS: 12 days    Time spent in minutes: 35    Calvert Cantor, MD [facility] Pager: [email or web address] Password [email or web address] 04/30/2017, 1:53 PM

## 2017-04-30 NOTE — Progress Notes (Addendum)
      [address]       [place] [address]             [phone]       7 Days Post-Op Procedure(s) (LRB): CHEST TUBE INSERTION (Right)  Subjective: Patient with a fair amount of pain at chest tube site.  Objective: Vital signs in last 24 hours: Temp:  [97.5 F (36.4 C)-98.4 F (36.9 C)] 98.4 F (36.9 C) (08/23 0730) Pulse Rate:  [92-101] 99 (08/23 0854) Cardiac Rhythm: Normal sinus rhythm (08/23 0700) Resp:  [16-18] 18 (08/23 0854) BP: (104-128)/(54-83) 104/72 (08/23 0730) SpO2:  [92 %-98 %] 95 % (08/23 0900) FiO2 (%):  [28 %] 28 % (08/23 0900) Weight:  [101.4 kg (223 lb 8 oz)] 101.4 kg (223 lb 8 oz) (08/23 0500)     Intake/Output from previous day: 08/22 0701 - 08/23 0700 In: 960 [P.O.:960] Out: 2350 [Urine:2200; Chest Tube:150]   Physical Exam:  Cardiovascular: RRR Pulmonary: Clear to auscultation on left and coarse on right Wounds: Dressing is clean and dry.   Chest Tube: to water seal, air leak that worsens with cough  Lab Results: CBC:  Recent Labs  04/29/17 0359  WBC 6.9  HGB 11.9*  HCT 37.3  PLT 284   BMET:   Recent Labs  04/29/17 0359  NA 138  K 3.4*  CL 100*  CO2 29  GLUCOSE 105*  BUN 7  CREATININE 1.03*  CALCIUM 9.1    PT/INR: No results for input(s): LABPROT, INR in the last 72 hours. ABG:  INR: Will add last result for INR, ABG once components are confirmed Will add last 4 CBG results once components are confirmed  Assessment/Plan:  1. CV - SR, first degree heart block in the 90's this am. 2.  Pulmonary - Chest tube with 150 cc last 24 hours. Chest tube is to water seal and there is an air leak that worsens with cough. On 2 liters of oxygen via [place]. Patient states she was on 2 liters via [place] at home prior to admission. CXR ordered for am but not today so WILL ORDER. Will need TALC once air leak smaller as is not surgical candidate.   ZIMMERMAN,DONIELLE MPA-C 04/30/2017,9:15 AM   CXR today shows  10-15% R  PNTX on water seal. Will leave on water seal and perform talc pleurodesis when airleak improved She will probably be transitioned to [facility] and home care Appropriate to ask for Palliative care as the sarcoidosis is advanced and further problems with spontaneous pneumothorax are likely  patient examined and medical record reviewed,agree with above note. [name] 04/30/2017

## 2017-05-01 ENCOUNTER — Inpatient Hospital Stay (HOSPITAL_COMMUNITY): Payer: Medicare Other

## 2017-05-01 DIAGNOSIS — Z9689 Presence of other specified functional implants: Secondary | ICD-10-CM

## 2017-05-01 NOTE — Progress Notes (Addendum)
[address]       [place] [address]             [phone]      8 Days Post-Op Procedure(s) (LRB): CHEST TUBE INSERTION (Right) Subjective: Feels fairly well, not SOB  Objective: Vital signs in last 24 hours: Temp:  [98 F (36.7 C)-98.8 F (37.1 C)] 98.4 F (36.9 C) (08/24 0843) Pulse Rate:  [91-105] 105 (08/24 0853) Cardiac Rhythm: Normal sinus rhythm (08/24 0721) Resp:  [16-19] 19 (08/24 0853) BP: (91-113)/(65-77) 106/65 (08/24 0843) SpO2:  [90 %-95 %] 95 % (08/24 0854) FiO2 (%):  [28 %] 28 % (08/24 0854)  Hemodynamic parameters for last 24 hours:    Intake/Output from previous day: 08/23 0701 - 08/24 0700 In: 240 [P.O.:240] Out: 675 [Urine:600; Chest Tube:75] Intake/Output this shift: Total I/O In: -  Out: 400 [Urine:400]  General appearance: alert, cooperative and no distress Heart: regular rate and rhythm Lungs: some coarseness in upper airways, mild wheeze Abdomen: benign Extremities: no edema Wound: dressings CDI  Lab Results:  Recent Labs  04/29/17 0359  WBC 6.9  HGB 11.9*  HCT 37.3  PLT 284   BMET:  Recent Labs  04/29/17 0359  NA 138  K 3.4*  CL 100*  CO2 29  GLUCOSE 105*  BUN 7  CREATININE 1.03*  CALCIUM 9.1    PT/INR: No results for input(s): LABPROT, INR in the last 72 hours. ABG    Component Value Date/Time   HCO3 26.6 04/18/2017 1746   TCO2 28 04/18/2017 1746   O2SAT 92.0 04/18/2017 1746   CBG (last 3)  No results for input(s): GLUCAP in the last 72 hours.  Meds Scheduled Meds: . arformoterol  15 mcg Nebulization BID  . bisacodyl  10 mg Rectal Daily  . budesonide (PULMICORT) nebulizer solution  0.5 mg Nebulization BID  . buPROPion  300 mg Oral Daily  . Chlorhexidine Gluconate Cloth  6 each Topical Daily  . chlorpheniramine-HYDROcodone  5 mL Oral Q12H  . cycloSPORINE  1 drop Both Eyes BID  . enoxaparin (LOVENOX) injection  40 mg Subcutaneous Q24H  . famotidine  20 mg Oral BID  .  fluticasone  1 spray Each Nare TID  . lidocaine  1 patch Transdermal Q24H  . loratadine  10 mg Oral Daily  . multivitamin with minerals  1 tablet Oral Daily  . mupirocin ointment  1 application Nasal BID  . oxyCODONE-acetaminophen  1 tablet Oral Q4H  . pantoprazole  40 mg Oral Daily  . polyethylene glycol  17 g Oral Daily  . senna-docusate  1 tablet Oral BID   Continuous Infusions: PRN Meds:.acetaminophen **OR** acetaminophen, benzonatate, fentaNYL (SUBLIMAZE) injection, levalbuterol, LORazepam, morphine injection, ondansetron **OR** ondansetron (ZOFRAN) IV  Xrays Dg Chest Port 1 View  Result Date: 05/01/2017 CLINICAL DATA:  Pneumothorax with chest tube present. EXAM: PORTABLE CHEST 1 VIEW COMPARISON:  April 30, 2017 FINDINGS: Right-sided chest tube position is unchanged. Pneumothorax in the right apex and lateral regions is stable in size and contour without tension component. There is extensive subcutaneous air on the right. Fibrosis in the mid and lower lung zones remain stable. There is a small right pleural effusion. There is no appreciable airspace consolidation. However, there is increased atelectasis in the left base compared to 1 day prior. Heart is mildly enlarged with pulmonary vascularity within normal limits. No adenopathy. No bone lesions. IMPRESSION: 1. Chest tube position on the right unchanged. No change and  right apical and lateral pneumothorax. No tension component. Extensive subcutaneous air on the right is stable. 2. Increase in left base atelectasis. Persistent fibrosis in the mid and lower lung zones. Small right pleural effusion. 3.  Stable cardiac silhouette. Electronically Signed   By: Bretta Bang III M.D.   On: 05/01/2017 07:19   Dg Chest Port 1 View  Result Date: 04/30/2017 CLINICAL DATA:  History of pneumothorax, right chest tube, followup EXAM: PORTABLE CHEST 1 VIEW COMPARISON:  Portable chest x-ray of 04/29/2017 FINDINGS: The volume of the right pneumothorax  has increased slightly, with no change in position of the right chest tube. Bibasilar pulmonary fibrotic change is again noted. Subcutaneous emphysema has not changed significantly. Heart size is stable. IMPRESSION: 1. Slight increase in volume of the right pneumothorax with no change in right chest tube. 2. No change in subcutaneous emphysema. Electronically Signed   By: Dwyane Dee M.D.   On: 04/30/2017 10:20   Dg Chest Port 1 View  Result Date: 04/29/2017 CLINICAL DATA:  Chest tube in place. EXAM: PORTABLE CHEST 1 VIEW COMPARISON:  Radiograph of April 28, 2017. FINDINGS: Stable cardiomediastinal silhouette. Right-sided chest tube is unchanged position, with mild right apical pneumothorax noted. Stable subcutaneous emphysema is seen over both supraclavicular regions and right lateral chest wall. Stable coarse interstitial densities are noted throughout both lungs concerning for edema or chronic interstitial lung disease. IMPRESSION: Mildly decreased right apical pneumothorax is noted. Stable subcutaneous emphysema is noted. Right-sided chest tube is unchanged in position. Electronically Signed   By: Lupita Raider, M.D.   On: 04/29/2017 18:36    Assessment/Plan: S/P Procedure(s) (LRB): CHEST TUBE INSERTION (Right)  1 conts with large air leak, 7/7 , CXR is stable in appearance- cont current management- not a candidate for Talc currently   LOS: 13 days    GOLD,[name] 05/01/2017  This air leak will be chronic due to severe parenchymal lung dz Plan home with [facility]-express Monday Try to inject talc thru tube prior to DC Office appt in a week with cxr Will need HHN for chest tube care patient examined and medical record reviewed,agree with above note. [name] 05/01/2017

## 2017-05-01 NOTE — Care Management Important Message (Signed)
Important Message  Patient Details  Name: VAUNE MCFARLAN MRN: [id number] Date of Birth: 1951/05/08   Medicare Important Message Given:  Yes    Kyla Balzarine 05/01/2017, 10:54 AM

## 2017-05-01 NOTE — Progress Notes (Signed)
PROGRESS NOTE    [name]   WJX:[id number]  DOB: 26-Jul-1951  DOA: 04/18/2017 PCP: Kiara Cables, MD   Brief Narrative:   Kiara Hutmacher Hyltonis a 66 y.o.femalewith medical history significant of pulmonary sarcoid. Just tapered off of prednisone completely on July 15th. Followed by Dr. Vassie Loll.  Patient presents to the ED with sudden onset of R sided CP and SOB. EMS put her on CPAP, given Albuterol and Atrovent, hypoxic in 80s, CXR with large right sided pneumothorax.    Subjective: Continues to have daily BMs with dulcolax. No new complaints.   ROS: no complaints of nausea, vomiting, diarrhea, cough, dyspnea or dysuria. No other complaints.   Assessment & Plan:   Principal Problem:   Pneumothorax on right - with air leak- CT surgery and pulmonary managing-  Plans for talc via chest tube once air leak resolved -  changed percocet to Q 4 routine rather than PRN for increasing pain- also has PRN Fentanyl -control cough with antitussives - CT surgery plans on her going home with [facility] no Monday- talc to be injected through tube prior to d/c  Active Problems: Constipation - Dulcolax suppository daily for 3 more day, Enema if it is ineffective  PULMONARY SARCOIDOSIS/  Chronic respiratory failure with hypoxia   - Nebs per pulmonary   Obesity Body mass index is 37.02 kg/m.   DVT prophylaxis: Lovenox Code Status: Full code Family Communication:  Disposition Plan: home when stable Consultants:   Pulm  CT surgery Procedures:   Chest tube Antimicrobials:  Anti-infectives    Start     Dose/Rate Route Frequency Ordered Stop   04/23/17 1230  vancomycin (VANCOCIN) IVPB 1000 mg/200 mL premix     1,000 mg 200 mL/hr over 60 Minutes Intravenous  Once 04/22/17 2043 04/23/17 1714       Objective: Vitals:   05/01/17 0843 05/01/17 0853 05/01/17 0854 05/01/17 0859  BP: 106/65     Pulse: 93 (!) 105    Resp:  19    Temp: 98.4 F (36.9 C)     TempSrc:  Oral     SpO2: 94% 95% 95% 98%  Weight:      Height:        Intake/Output Summary (Last 24 hours) at 05/01/17 1256 Last data filed at 05/01/17 0846  Gross per 24 hour  Intake              240 ml  Output             1075 ml  Net             -835 ml   Filed Weights   04/27/17 0509 04/28/17 0324 04/30/17 0500  Weight: 99.1 kg (218 lb 6.4 oz) 98.3 kg (216 lb 11.2 oz) 101.4 kg (223 lb 8 oz)    Examination: General exam: Appears comfortable  HEENT: PERRLA, oral mucosa moist, no sclera icterus or thrush Respiratory system: Clear to auscultation. Respiratory effort normal. Cardiovascular system: S1 & S2 heard,  No murmurs  Gastrointestinal system: Abdomen soft, non-tender, nondistended. Normal bowel sound. No organomegaly Central nervous system: Alert and oriented. No focal neurological deficits. Extremities: No cyanosis, clubbing or edema Skin: No rashes or ulcers Psychiatry:  Mood & affect appropriate.    Data Reviewed: I have personally reviewed following labs and imaging studies  CBC:  Recent Labs Lab 04/29/17 0359  WBC 6.9  NEUTROABS 3.4  HGB 11.9*  HCT 37.3  MCV 91.4  PLT 284  Basic Metabolic Panel:  Recent Labs Lab 04/29/17 0359  NA 138  K 3.4*  CL 100*  CO2 29  GLUCOSE 105*  BUN 7  CREATININE 1.03*  CALCIUM 9.1   GFR: Estimated Creatinine Clearance: 61.7 mL/min (A) (by C-G formula based on SCr of 1.03 mg/dL (H)). Liver Function Tests: No results for input(s): AST, ALT, ALKPHOS, BILITOT, PROT, ALBUMIN in the last 168 hours. No results for input(s): LIPASE, AMYLASE in the last 168 hours. No results for input(s): AMMONIA in the last 168 hours. Coagulation Profile: No results for input(s): INR, PROTIME in the last 168 hours. Cardiac Enzymes: No results for input(s): CKTOTAL, CKMB, CKMBINDEX, TROPONINI in the last 168 hours. BNP (last 3 results) No results for input(s): PROBNP in the last 8760 hours. HbA1C: No results for input(s): HGBA1C in the last  72 hours. CBG: No results for input(s): GLUCAP in the last 168 hours. Lipid Profile: No results for input(s): CHOL, HDL, LDLCALC, TRIG, CHOLHDL, LDLDIRECT in the last 72 hours. Thyroid Function Tests: No results for input(s): TSH, T4TOTAL, FREET4, T3FREE, THYROIDAB in the last 72 hours. Anemia Panel: No results for input(s): VITAMINB12, FOLATE, FERRITIN, TIBC, IRON, RETICCTPCT in the last 72 hours. Urine analysis:    Component Value Date/Time   BILIRUBINUR Negaitve 03/20/2016 1158   PROTEINUR negative 03/20/2016 1158   UROBILINOGEN negative 03/20/2016 1158   NITRITE negative 03/20/2016 1158   LEUKOCYTESUR Negative 03/20/2016 1158   Sepsis Labs: @LABRCNTIP (procalcitonin:4,lacticidven:4) ) Recent Results (from the past 240 hour(s))  Surgical PCR screen     Status: Abnormal   Collection Time: 04/23/17  4:20 AM  Result Value Ref Range Status   MRSA, PCR NEGATIVE NEGATIVE Final   Staphylococcus aureus POSITIVE (A) NEGATIVE Final    Comment:        The Xpert SA Assay (FDA approved for NASAL specimens in patients over 55 years of age), is one component of a comprehensive surveillance program.  Test performance has been validated by [facility] for patients greater than or equal to 52 year old. It is not intended to diagnose infection nor to guide or monitor treatment.          Radiology Studies: Dg Chest Port 1 View  Result Date: 05/01/2017 CLINICAL DATA:  Pneumothorax with chest tube present. EXAM: PORTABLE CHEST 1 VIEW COMPARISON:  April 30, 2017 FINDINGS: Right-sided chest tube position is unchanged. Pneumothorax in the right apex and lateral regions is stable in size and contour without tension component. There is extensive subcutaneous air on the right. Fibrosis in the mid and lower lung zones remain stable. There is a small right pleural effusion. There is no appreciable airspace consolidation. However, there is increased atelectasis in the left base compared to 1 day  prior. Heart is mildly enlarged with pulmonary vascularity within normal limits. No adenopathy. No bone lesions. IMPRESSION: 1. Chest tube position on the right unchanged. No change and right apical and lateral pneumothorax. No tension component. Extensive subcutaneous air on the right is stable. 2. Increase in left base atelectasis. Persistent fibrosis in the mid and lower lung zones. Small right pleural effusion. 3.  Stable cardiac silhouette. Electronically Signed   By: Bretta Bang III M.D.   On: 05/01/2017 07:19   Dg Chest Port 1 View  Result Date: 04/30/2017 CLINICAL DATA:  History of pneumothorax, right chest tube, followup EXAM: PORTABLE CHEST 1 VIEW COMPARISON:  Portable chest x-ray of 04/29/2017 FINDINGS: The volume of the right pneumothorax has increased slightly, with no change  in position of the right chest tube. Bibasilar pulmonary fibrotic change is again noted. Subcutaneous emphysema has not changed significantly. Heart size is stable. IMPRESSION: 1. Slight increase in volume of the right pneumothorax with no change in right chest tube. 2. No change in subcutaneous emphysema. Electronically Signed   By: Dwyane Dee M.D.   On: 04/30/2017 10:20   Dg Chest Port 1 View  Result Date: 04/29/2017 CLINICAL DATA:  Chest tube in place. EXAM: PORTABLE CHEST 1 VIEW COMPARISON:  Radiograph of April 28, 2017. FINDINGS: Stable cardiomediastinal silhouette. Right-sided chest tube is unchanged position, with mild right apical pneumothorax noted. Stable subcutaneous emphysema is seen over both supraclavicular regions and right lateral chest wall. Stable coarse interstitial densities are noted throughout both lungs concerning for edema or chronic interstitial lung disease. IMPRESSION: Mildly decreased right apical pneumothorax is noted. Stable subcutaneous emphysema is noted. Right-sided chest tube is unchanged in position. Electronically Signed   By: Lupita Raider, M.D.   On: 04/29/2017 18:36       Scheduled Meds: . arformoterol  15 mcg Nebulization BID  . bisacodyl  10 mg Rectal Daily  . budesonide (PULMICORT) nebulizer solution  0.5 mg Nebulization BID  . buPROPion  300 mg Oral Daily  . chlorpheniramine-HYDROcodone  5 mL Oral Q12H  . cycloSPORINE  1 drop Both Eyes BID  . enoxaparin (LOVENOX) injection  40 mg Subcutaneous Q24H  . famotidine  20 mg Oral BID  . fluticasone  1 spray Each Nare TID  . lidocaine  1 patch Transdermal Q24H  . loratadine  10 mg Oral Daily  . multivitamin with minerals  1 tablet Oral Daily  . mupirocin ointment  1 application Nasal BID  . oxyCODONE-acetaminophen  1 tablet Oral Q4H  . pantoprazole  40 mg Oral Daily  . polyethylene glycol  17 g Oral Daily  . senna-docusate  1 tablet Oral BID   Continuous Infusions:   LOS: 13 days    Time spent in minutes: 35    Calvert Cantor, MD [facility] Pager: [email or web address] Password [facility] 05/01/2017, 12:56 PM

## 2017-05-02 NOTE — Progress Notes (Signed)
PROGRESS NOTE    [name]   [name]  DOB: 1951/01/18  DOA: 04/18/2017 PCP: Pearline Cables, MD   Brief Narrative:   Tyrena Gohr Hyltonis a 66 y.o.femalewith medical history significant of pulmonary sarcoid. Just tapered off of prednisone completely on July 15th. Followed by Dr. Vassie Loll.  Patient presents to the ED with sudden onset of R sided CP and SOB. EMS put her on CPAP, given Albuterol and Atrovent, hypoxic in 80s, CXR with large right sided pneumothorax.    Subjective: Pain in chest tube is controlled with narcotics. Continues to have daily BMs with Dulcolax suppository. No new complaints. Walked in the hall yesterday.  ROS: no complaints of nausea, vomiting, diarrhea, cough, dyspnea or dysuria. No other complaints.   Assessment & Plan:   Principal Problem:   Pneumothorax on right - with air leak- CT surgery and pulmonary managing-  Plans for talc via chest tube once air leak resolved -  changed percocet to Q 4 routine rather than PRN for increasing pain- also has PRN Fentanyl -control cough with antitussives - CT surgery plans on her going home with [employer] no Monday- talc to be injected through tube prior to d/c  Active Problems: Constipation - Dulcolax suppository daily for 3 more day, Enema if it is ineffective  PULMONARY SARCOIDOSIS/  Chronic respiratory failure with hypoxia   - Nebs per pulmonary   Obesity Body mass index is 37.02 kg/m.   DVT prophylaxis: Lovenox Code Status: Full code Family Communication:  Disposition Plan: home when stable Consultants:   Pulm  CT surgery Procedures:   Chest tube Antimicrobials:  Anti-infectives    Start     Dose/Rate Route Frequency Ordered Stop   04/23/17 1230  vancomycin (VANCOCIN) IVPB 1000 mg/200 mL premix     1,000 mg 200 mL/hr over 60 Minutes Intravenous  Once 04/22/17 2043 04/23/17 1714       Objective: Vitals:   05/01/17 2050 05/02/17 0520 05/02/17 0844 05/02/17 0934    BP: 102/70 102/76 116/74   Pulse: (!) 103 82 99   Resp: (!) 21 17    Temp: 98.7 F (37.1 C) 98.1 F (36.7 C) 98.2 F (36.8 C)   TempSrc: Oral Oral Oral   SpO2: 94% 98%  97%  Weight:  99.3 kg (218 lb 14.4 oz)    Height:        Intake/Output Summary (Last 24 hours) at 05/02/17 1257 Last data filed at 05/02/17 1100  Gross per 24 hour  Intake              480 ml  Output             1020 ml  Net             -540 ml   Filed Weights   04/28/17 0324 04/30/17 0500 05/02/17 0520  Weight: 98.3 kg (216 lb 11.2 oz) 101.4 kg (223 lb 8 oz) 99.3 kg (218 lb 14.4 oz)    Examination: General exam: Appears comfortable  HEENT: PERRLA, oral mucosa moist, no sclera icterus or thrush Respiratory system: Clear to auscultation. Respiratory effort normal. Cardiovascular system: S1 & S2 heard,  No murmurs  Gastrointestinal system: Abdomen soft, non-tender, nondistended. Normal bowel sound. No organomegaly Central nervous system: Alert and oriented. No focal neurological deficits. Extremities: No cyanosis, clubbing or edema Skin: No rashes or ulcers Psychiatry:  Mood & affect appropriate.     Data Reviewed: I have personally reviewed following labs and imaging studies  CBC:  Recent Labs Lab 04/29/17 0359  WBC 6.9  NEUTROABS 3.4  HGB 11.9*  HCT 37.3  MCV 91.4  PLT 284   Basic Metabolic Panel:  Recent Labs Lab 04/29/17 0359  NA 138  K 3.4*  CL 100*  CO2 29  GLUCOSE 105*  BUN 7  CREATININE 1.03*  CALCIUM 9.1   GFR: Estimated Creatinine Clearance: 61 mL/min (A) (by C-G formula based on SCr of 1.03 mg/dL (H)). Liver Function Tests: No results for input(s): AST, ALT, ALKPHOS, BILITOT, PROT, ALBUMIN in the last 168 hours. No results for input(s): LIPASE, AMYLASE in the last 168 hours. No results for input(s): AMMONIA in the last 168 hours. Coagulation Profile: No results for input(s): INR, PROTIME in the last 168 hours. Cardiac Enzymes: No results for input(s): CKTOTAL, CKMB,  CKMBINDEX, TROPONINI in the last 168 hours. BNP (last 3 results) No results for input(s): PROBNP in the last 8760 hours. HbA1C: No results for input(s): HGBA1C in the last 72 hours. CBG: No results for input(s): GLUCAP in the last 168 hours. Lipid Profile: No results for input(s): CHOL, HDL, LDLCALC, TRIG, CHOLHDL, LDLDIRECT in the last 72 hours. Thyroid Function Tests: No results for input(s): TSH, T4TOTAL, FREET4, T3FREE, THYROIDAB in the last 72 hours. Anemia Panel: No results for input(s): VITAMINB12, FOLATE, FERRITIN, TIBC, IRON, RETICCTPCT in the last 72 hours. Urine analysis:    Component Value Date/Time   BILIRUBINUR Negaitve 03/20/2016 1158   PROTEINUR negative 03/20/2016 1158   UROBILINOGEN negative 03/20/2016 1158   NITRITE negative 03/20/2016 1158   LEUKOCYTESUR Negative 03/20/2016 1158   Sepsis Labs: @LABRCNTIP (procalcitonin:4,lacticidven:4) ) Recent Results (from the past 240 hour(s))  Surgical PCR screen     Status: Abnormal   Collection Time: 04/23/17  4:20 AM  Result Value Ref Range Status   MRSA, PCR NEGATIVE NEGATIVE Final   Staphylococcus aureus POSITIVE (A) NEGATIVE Final    Comment:        The Xpert SA Assay (FDA approved for NASAL specimens in patients over 42 years of age), is one component of a comprehensive surveillance program.  Test performance has been validated by [facility] for patients greater than or equal to 59 year old. It is not intended to diagnose infection nor to guide or monitor treatment.          Radiology Studies: Dg Chest Port 1 View  Result Date: 05/01/2017 CLINICAL DATA:  Pneumothorax with chest tube present. EXAM: PORTABLE CHEST 1 VIEW COMPARISON:  April 30, 2017 FINDINGS: Right-sided chest tube position is unchanged. Pneumothorax in the right apex and lateral regions is stable in size and contour without tension component. There is extensive subcutaneous air on the right. Fibrosis in the mid and lower lung zones  remain stable. There is a small right pleural effusion. There is no appreciable airspace consolidation. However, there is increased atelectasis in the left base compared to 1 day prior. Heart is mildly enlarged with pulmonary vascularity within normal limits. No adenopathy. No bone lesions. IMPRESSION: 1. Chest tube position on the right unchanged. No change and right apical and lateral pneumothorax. No tension component. Extensive subcutaneous air on the right is stable. 2. Increase in left base atelectasis. Persistent fibrosis in the mid and lower lung zones. Small right pleural effusion. 3.  Stable cardiac silhouette. Electronically Signed   By: Bretta Bang III M.D.   On: 05/01/2017 07:19      Scheduled Meds: . arformoterol  15 mcg Nebulization BID  . bisacodyl  10  mg Rectal Daily  . budesonide (PULMICORT) nebulizer solution  0.5 mg Nebulization BID  . buPROPion  300 mg Oral Daily  . chlorpheniramine-HYDROcodone  5 mL Oral Q12H  . cycloSPORINE  1 drop Both Eyes BID  . enoxaparin (LOVENOX) injection  40 mg Subcutaneous Q24H  . famotidine  20 mg Oral BID  . fluticasone  1 spray Each Nare TID  . lidocaine  1 patch Transdermal Q24H  . loratadine  10 mg Oral Daily  . multivitamin with minerals  1 tablet Oral Daily  . oxyCODONE-acetaminophen  1 tablet Oral Q4H  . pantoprazole  40 mg Oral Daily  . polyethylene glycol  17 g Oral Daily  . senna-docusate  1 tablet Oral BID   Continuous Infusions:   LOS: 14 days    Time spent in minutes: 35    Calvert Cantor, MD [facility] Pager: [email or web address] Password [facility] 05/02/2017, 12:57 PM

## 2017-05-02 NOTE — Progress Notes (Addendum)
[address]       [place] [address]             [phone]      9 Days Post-Op Procedure(s) (LRB): CHEST TUBE INSERTION (Right) Subjective: Feels ok, no specific C/O  Objective: Vital signs in last 24 hours: Temp:  [98.1 F (36.7 C)-98.7 F (37.1 C)] 98.2 F (36.8 C) (08/25 0844) Pulse Rate:  [82-103] 99 (08/25 0844) Cardiac Rhythm: Normal sinus rhythm (08/25 0713) Resp:  [17-21] 17 (08/25 0520) BP: (94-116)/(66-76) 116/74 (08/25 0844) SpO2:  [94 %-100 %] 97 % (08/25 0934) FiO2 (%):  [2 %] 2 % (08/24 2035) Weight:  [218 lb 14.4 oz (99.3 kg)] 218 lb 14.4 oz (99.3 kg) (08/25 0520)  Hemodynamic parameters for last 24 hours:    Intake/Output from previous day: 08/24 0701 - 08/25 0700 In: 240 [P.O.:240] Out: 1170 [Urine:1100; Chest Tube:70] Intake/Output this shift: Total I/O In: 240 [P.O.:240] Out: 250 [Urine:250]  General appearance: alert, cooperative and no distress Heart: regular rate and rhythm Lungs: clear to auscultation bilaterally Abdomen: benign Extremities: no edema  Lab Results: No results for input(s): WBC, HGB, HCT, PLT in the last 72 hours. BMET: No results for input(s): NA, K, CL, CO2, GLUCOSE, BUN, CREATININE, CALCIUM in the last 72 hours.  PT/INR: No results for input(s): LABPROT, INR in the last 72 hours. ABG    Component Value Date/Time   HCO3 26.6 04/18/2017 1746   TCO2 28 04/18/2017 1746   O2SAT 92.0 04/18/2017 1746   CBG (last 3)  No results for input(s): GLUCAP in the last 72 hours.  Meds Scheduled Meds: . arformoterol  15 mcg Nebulization BID  . bisacodyl  10 mg Rectal Daily  . budesonide (PULMICORT) nebulizer solution  0.5 mg Nebulization BID  . buPROPion  300 mg Oral Daily  . chlorpheniramine-HYDROcodone  5 mL Oral Q12H  . cycloSPORINE  1 drop Both Eyes BID  . enoxaparin (LOVENOX) injection  40 mg Subcutaneous Q24H  . famotidine  20 mg Oral BID  . fluticasone  1 spray Each Nare TID  . lidocaine  1  patch Transdermal Q24H  . loratadine  10 mg Oral Daily  . multivitamin with minerals  1 tablet Oral Daily  . oxyCODONE-acetaminophen  1 tablet Oral Q4H  . pantoprazole  40 mg Oral Daily  . polyethylene glycol  17 g Oral Daily  . senna-docusate  1 tablet Oral BID   Continuous Infusions: PRN Meds:.acetaminophen **OR** acetaminophen, benzonatate, fentaNYL (SUBLIMAZE) injection, levalbuterol, LORazepam, morphine injection, ondansetron **OR** ondansetron (ZOFRAN) IV  Xrays Dg Chest Port 1 View  Result Date: 05/01/2017 CLINICAL DATA:  Pneumothorax with chest tube present. EXAM: PORTABLE CHEST 1 VIEW COMPARISON:  April 30, 2017 FINDINGS: Right-sided chest tube position is unchanged. Pneumothorax in the right apex and lateral regions is stable in size and contour without tension component. There is extensive subcutaneous air on the right. Fibrosis in the mid and lower lung zones remain stable. There is a small right pleural effusion. There is no appreciable airspace consolidation. However, there is increased atelectasis in the left base compared to 1 day prior. Heart is mildly enlarged with pulmonary vascularity within normal limits. No adenopathy. No bone lesions. IMPRESSION: 1. Chest tube position on the right unchanged. No change and right apical and lateral pneumothorax. No tension component. Extensive subcutaneous air on the right is stable. 2. Increase in left base atelectasis. Persistent fibrosis in the mid and lower lung zones.  Small right pleural effusion. 3.  Stable cardiac silhouette. Electronically Signed   By: Bretta Bang III M.D.   On: 05/01/2017 07:19    Assessment/Plan: S/P Procedure(s) (LRB): CHEST TUBE INSERTION (Right)  1 stable with large persistent air leak, Not able to insert talc with 7/7 leak, cont current management- no new CXR today 2 medical management per primary service  LOS: 14 days    [name],[name] 05/02/2017   I have seen and examined the patient and agree  with the assessment and plan as outlined.  Probably should have daily CXR's due to severity of air leak  Purcell Nails, MD 05/02/2017 12:21 PM

## 2017-05-03 ENCOUNTER — Inpatient Hospital Stay (HOSPITAL_COMMUNITY): Payer: Medicare Other

## 2017-05-03 MED ORDER — POLYETHYLENE GLYCOL 3350 17 G PO PACK: 17.0000 g | PACK | Freq: Every day | ORAL | Status: DC

## 2017-05-03 NOTE — Progress Notes (Addendum)
PROGRESS NOTE    TEXAS OBORN   ONG:[id number]  DOB: 11-20-1950  DOA: 04/18/2017 PCP: Pearline Cables, MD   Brief Narrative:   [name] a 66 y.o.femalewith medical history significant of pulmonary sarcoid. Just tapered off of prednisone completely on July 15th. Followed by Dr. Vassie Loll.  Patient presents to the ED with sudden onset of R sided CP and SOB. EMS put her on CPAP, given Albuterol and Atrovent, hypoxic in 80s, CXR with large right sided pneumothorax.    Subjective: No new complaints needed IV pain medication last night for pain from chest tube.  ROS: no complaints of nausea, vomiting, diarrhea, cough, dyspnea or dysuria. No other complaints.   Assessment & Plan:   Principal Problem:   Pneumothorax on right - with air leak- CT surgery and pulmonary managing-  Plans for talc via chest tube once air leak resolved -  changed percocet to Q 4 routine rather than PRN for increasing pain- also has PRN Fentanyl -control cough with antitussives - CT surgery guiding care  Active Problems: Constipation - no BM in 10 days - Dulcolax suppositories given x 5 days which allowed her to have daily BMs - continue to give daily Miralax and follow  PULMONARY SARCOIDOSIS- dx 1995 /  Chronic respiratory failure with hypoxia   - following with [facility] as far back at 2009   Obesity Body mass index is 37.02 kg/m.   DVT prophylaxis: Lovenox Code Status: Full code Family Communication:  Disposition Plan: home when stable Consultants:   Pulm  CT surgery Procedures:   Chest tube Antimicrobials:  Anti-infectives    Start     Dose/Rate Route Frequency Ordered Stop   04/23/17 1230  vancomycin (VANCOCIN) IVPB 1000 mg/200 mL premix     1,000 mg 200 mL/hr over 60 Minutes Intravenous  Once 04/22/17 2043 04/23/17 1714       Objective: Vitals:   05/02/17 1900 05/02/17 2138 05/03/17 0542 05/03/17 0843  BP: 125/71  (!) 91/52   Pulse: (!) 101 93 90     Resp: (!) 27 19 17    Temp: 98.1 F (36.7 C)  98.5 F (36.9 C)   TempSrc: Oral  Oral   SpO2: 95% 96% 97% 98%  Weight:   98.7 kg (217 lb 11.2 oz)   Height:        Intake/Output Summary (Last 24 hours) at 05/03/17 1015 Last data filed at 05/03/17 0854  Gross per 24 hour  Intake             1075 ml  Output              270 ml  Net              805 ml   Filed Weights   04/30/17 0500 05/02/17 0520 05/03/17 0542  Weight: 101.4 kg (223 lb 8 oz) 99.3 kg (218 lb 14.4 oz) 98.7 kg (217 lb 11.2 oz)    Examination: General exam: Appears comfortable  HEENT: PERRLA, oral mucosa moist, no sclera icterus or thrush Respiratory system: Clear to auscultation. Respiratory effort normal. Cardiovascular system: S1 & S2 heard,  No murmurs  Gastrointestinal system: Abdomen soft, non-tender, nondistended. Normal bowel sound. No organomegaly Central nervous system: Alert and oriented. No focal neurological deficits. Extremities: No cyanosis, clubbing or edema Skin: No rashes or ulcers Psychiatry:  Mood & affect appropriate.    Data Reviewed: I have personally reviewed following labs and imaging studies  CBC:  Recent Labs  Lab 04/29/17 0359  WBC 6.9  NEUTROABS 3.4  HGB 11.9*  HCT 37.3  MCV 91.4  PLT 284   Basic Metabolic Panel:  Recent Labs Lab 04/29/17 0359  NA 138  K 3.4*  CL 100*  CO2 29  GLUCOSE 105*  BUN 7  CREATININE 1.03*  CALCIUM 9.1   GFR: Estimated Creatinine Clearance: 60.8 mL/min (A) (by C-G formula based on SCr of 1.03 mg/dL (H)). Liver Function Tests: No results for input(s): AST, ALT, ALKPHOS, BILITOT, PROT, ALBUMIN in the last 168 hours. No results for input(s): LIPASE, AMYLASE in the last 168 hours. No results for input(s): AMMONIA in the last 168 hours. Coagulation Profile: No results for input(s): INR, PROTIME in the last 168 hours. Cardiac Enzymes: No results for input(s): CKTOTAL, CKMB, CKMBINDEX, TROPONINI in the last 168 hours. BNP (last 3  results) No results for input(s): PROBNP in the last 8760 hours. HbA1C: No results for input(s): HGBA1C in the last 72 hours. CBG: No results for input(s): GLUCAP in the last 168 hours. Lipid Profile: No results for input(s): CHOL, HDL, LDLCALC, TRIG, CHOLHDL, LDLDIRECT in the last 72 hours. Thyroid Function Tests: No results for input(s): TSH, T4TOTAL, FREET4, T3FREE, THYROIDAB in the last 72 hours. Anemia Panel: No results for input(s): VITAMINB12, FOLATE, FERRITIN, TIBC, IRON, RETICCTPCT in the last 72 hours. Urine analysis:    Component Value Date/Time   BILIRUBINUR Negaitve 03/20/2016 1158   PROTEINUR negative 03/20/2016 1158   UROBILINOGEN negative 03/20/2016 1158   NITRITE negative 03/20/2016 1158   LEUKOCYTESUR Negative 03/20/2016 1158   Sepsis Labs: @LABRCNTIP (procalcitonin:4,lacticidven:4) ) No results found for this or any previous visit (from the past 240 hour(s)).       Radiology Studies: Dg Chest Port 1 View  Result Date: 05/03/2017 CLINICAL DATA:  Followup right pneumothorax and atelectasis. Sarcoidosis. EXAM: PORTABLE CHEST 1 VIEW COMPARISON:  05/01/2017 FINDINGS: Right chest tube remains in place. Allowing for more lordotic positioning on current exam, a 20-25% right apical pneumothorax shows no significant change in size. Chronic interstitial lung disease shows no significant interval change. No evidence of superimposed pulmonary consolidation. Heart size is normal. IMPRESSION: No significant change in right apical pneumothorax with chest tube remaining in place. Chronic interstitial lung disease. Electronically Signed   By: Myles Rosenthal M.D.   On: 05/03/2017 07:43      Scheduled Meds: . arformoterol  15 mcg Nebulization BID  . bisacodyl  10 mg Rectal Daily  . budesonide (PULMICORT) nebulizer solution  0.5 mg Nebulization BID  . buPROPion  300 mg Oral Daily  . chlorpheniramine-HYDROcodone  5 mL Oral Q12H  . cycloSPORINE  1 drop Both Eyes BID  . enoxaparin  (LOVENOX) injection  40 mg Subcutaneous Q24H  . famotidine  20 mg Oral BID  . fluticasone  1 spray Each Nare TID  . lidocaine  1 patch Transdermal Q24H  . loratadine  10 mg Oral Daily  . multivitamin with minerals  1 tablet Oral Daily  . oxyCODONE-acetaminophen  1 tablet Oral Q4H  . pantoprazole  40 mg Oral Daily  . polyethylene glycol  17 g Oral Daily  . senna-docusate  1 tablet Oral BID   Continuous Infusions:   LOS: 15 days    Time spent in minutes: 35    Calvert Cantor, MD [facility] Pager: [email or web address] Password [email or web address] 05/03/2017, 10:15 AM

## 2017-05-03 NOTE — Progress Notes (Signed)
      [address]       [place] [address]             [phone]      CARDIOTHORACIC SURGERY PROGRESS NOTE  10 Days Post-Op  S/P Procedure(s) (LRB): CHEST TUBE INSERTION (Right)  Subjective: No complaints except for pain associated w/ chest tube.  No SOB  Objective: Vital signs in last 24 hours: Temp:  [98.1 F (36.7 C)-98.5 F (36.9 C)] 98.5 F (36.9 C) (08/26 0542) Pulse Rate:  [90-101] 90 (08/26 0542) Cardiac Rhythm: Normal sinus rhythm (08/26 0727) Resp:  [17-27] 17 (08/26 0542) BP: (91-125)/(52-71) 91/52 (08/26 0542) SpO2:  [95 %-98 %] 98 % (08/26 0843) FiO2 (%):  [2 %] 2 % (08/26 0843) Weight:  [217 lb 11.2 oz (98.7 kg)] 217 lb 11.2 oz (98.7 kg) (08/26 0542)  Physical Exam:  Rhythm:   sinus  Breath sounds: Diminished at bases, few insp crackles  Heart sounds:  RRR  Incisions:  n/a  Abdomen:  soft  Extremities:  warm  Chest tube(s):  + air leak, minimal drainage   Intake/Output from previous day: 08/25 0701 - 08/26 0700 In: 720 [P.O.:720] Out: 270 [Urine:250; Chest Tube:20] Intake/Output this shift: Total I/O In: 355 [P.O.:355] Out: -   Lab Results: No results for input(s): WBC, HGB, HCT, PLT in the last 72 hours. BMET: No results for input(s): NA, K, CL, CO2, GLUCOSE, BUN, CREATININE, CALCIUM in the last 72 hours.  CBG (last 3)  No results for input(s): GLUCAP in the last 72 hours.  CXR:  PORTABLE CHEST 1 VIEW  COMPARISON:  05/01/2017  FINDINGS: Right chest tube remains in place. Allowing for more lordotic positioning on current exam, a 20-25% right apical pneumothorax shows no significant change in size.  Chronic interstitial lung disease shows no significant interval change. No evidence of superimposed pulmonary consolidation. Heart size is normal.  IMPRESSION: No significant change in right apical pneumothorax with chest tube remaining in place.  Chronic interstitial lung disease.   Electronically Signed  By: Myles Rosenthal M.D.   On: 05/03/2017 07:43  Assessment/Plan: S/P Procedure(s) (LRB): CHEST TUBE INSERTION (Right)  Clinically stable.  Still w/ incomplete reexpansion of right lung but stable on water seal.  Continue conservative management.  I spent in excess of 15 minutes during the conduct of this hospital encounter and >50% of this time involved direct face-to-face encounter with the patient for counseling and/or coordination of their care.                  Level 1                 15 minutes                Level 2                 25 minutes                Level 3                 35 minutes   Purcell Nails, MD 05/03/2017 12:32 PM

## 2017-05-04 DIAGNOSIS — Z7189 Other specified counseling: Secondary | ICD-10-CM

## 2017-05-04 DIAGNOSIS — Z515 Encounter for palliative care: Secondary | ICD-10-CM

## 2017-05-04 DIAGNOSIS — R05 Cough: Secondary | ICD-10-CM

## 2017-05-04 DIAGNOSIS — R0789 Other chest pain: Secondary | ICD-10-CM

## 2017-05-04 DIAGNOSIS — R509 Fever, unspecified: Secondary | ICD-10-CM

## 2017-05-04 MED ORDER — AZITHROMYCIN 250 MG PO TABS
250.0000 mg | ORAL_TABLET | Freq: Every day | ORAL | Status: AC
  Administered 2017-05-05 – 2017-05-08 (×4): 250 mg via ORAL
  Filled 2017-05-04 (×4): qty 1

## 2017-05-04 MED ORDER — OXYCODONE-ACETAMINOPHEN 5-325 MG PO TABS
1.0000 | ORAL_TABLET | ORAL | Status: DC
  Administered 2017-05-04 – 2017-05-06 (×10): 1 via ORAL
  Filled 2017-05-04 (×10): qty 1

## 2017-05-04 MED ORDER — AZITHROMYCIN 250 MG PO TABS
500.0000 mg | ORAL_TABLET | Freq: Every day | ORAL | Status: AC
  Administered 2017-05-04: 500 mg via ORAL
  Filled 2017-05-04: qty 2

## 2017-05-04 MED ORDER — MORPHINE SULFATE (PF) 4 MG/ML IV SOLN
2.0000 mg | INTRAVENOUS | Status: DC | PRN
  Administered 2017-05-05 (×4): 4 mg via INTRAVENOUS
  Administered 2017-05-06: 2 mg via INTRAVENOUS
  Filled 2017-05-04 (×6): qty 1

## 2017-05-04 MED ORDER — OXYCODONE-ACETAMINOPHEN 5-325 MG PO TABS
1.0000 | ORAL_TABLET | ORAL | Status: DC
  Administered 2017-05-04: 2 via ORAL
  Filled 2017-05-04: qty 2

## 2017-05-04 NOTE — Consult Note (Signed)
Consultation Note Date: 05/04/2017   Patient Name: [name]  DOB: Mar 29, 1951  MRN: [id number]  Age / Sex: 66 y.o., female  PCP: Copland, Gay Filler, MD Referring Physician: Debbe Odea, MD  Reason for Consultation: Establishing goals of care and Psychosocial/spiritual support  HPI/Patient Profile: 66 y.o. female  with past medical history of pulmonary sarcoid (diagnosed in 1995, followed by Dr. Elsworth Soho, just tapered off Prednisone July 15th), asthma, anxiety, and GERD. She presented to the ED from home with acute onset right sided chest pain and shortness of breath. Imaging in ED noted large right pneumothorax with complete lung collapse. She was admitted on 04/18/2017. Cardiothoracic surgery following, s/p chest tube placement and talc placement (to prevent further lung collapse). Unfortunately, she is not a candidate for VATS due to end stage sarcoidosis. Plan for long term chest-tube. Palliative consulted for goals of care.  Clinical Assessment and Goals of Care: I met [name] at her bedside, unfortunately no family was present, but she did not indicate that she wanted [place] to call anyone. I met her along with my colleague, Jalene Mullet (NP with palliative care).  [name] was happy to talk with [place]. She had an excellent grasp on her health state. She has been living with sarcoid since the late 90's and has taken time to do extensive research on her disease. She understands the chronic and incurable nature of it, and accepts that management involves close follow-up with pulmonology and medications as needed. Her family has not been as involved with her healthcare, which is in part because she has been predominantly independent and places high value on this. As her health needs have changes she has developed coping strategies to mitigate the impact, such as utilizing home delivery for groceries and medications. She has extensive close friends and a  large family that provide a lot of support (and would likely do more to help if she allowed it). Importantly, she is effectively home alone as her husband travels for work and her children are out of state.  Presently, she has a good grasp on her acute health issues and how they tie into her chronic disease. She views this situation as a hiccup, and expresses the strong belief that she will get through this and has a future in which she remains independent and healthy. That said, she is very realistic that things can get worse. She has started talking with her family about her end of life wishes. She has not yet had the opportunity to talk about code status. She expressed to [place] the desire for DNR, however is not yet in a place to formalize that without talking with her family about it first. She plans to do this after discharge once she is at home and has the time to sit down with them.  In terms of what can be managed here, her biggest issue right now is acute pain management. Her pain had been improving up today with the utilization of scheduled pain medication. She did have some increased pain at night. With the talc placement today her pain had an expected acute exacerbation, for which she has required more pain medication. I shared with her the plan to utilize PRN medication today, and we will work to adjust this (titrate down) tomorrow once she is through this acute crisis.  Primary Decision Maker PATIENT   SUMMARY OF RECOMMENDATIONS    Full code, full scope aggressive treatment  Pt wants to discuss code status with her family  once she is home, she will reach out to Dr. Elsworth Soho. I will provide her with Living Will and MOST form tomorrow to help guide this conversation after discharge  Pain management Pt was not on any pain medication prior to admission. Her pain had been under good control prior to today, and I expect would have done well with an extra evening dose of oral pain medication at  night. With the talc placement today she had an acute pain crisis--this should improve, but will require additional pain medication in the interim.   Continue scheduled Percocet 5/325 q4H  Change Morphine to 2-6m q3H PRN  Code Status/Advance Care Planning:  Full code  Palliative Prophylaxis:   Bowel Regimen and Frequent Pain Assessment  Additional Recommendations (Limitations, Scope, Preferences):  Full Scope Treatment  Psycho-social/Spiritual:   Desire for further Chaplaincy support:yes  Prognosis:   Unable to determine  Discharge Planning: Home with Home Health      Primary Diagnoses: Present on Admission: . Pneumothorax on right . [place] . PULMONARY SARCOIDOSIS . Mild persistent asthma in adult without complication with component of vcd . Spontaneous pneumothorax   I have reviewed the medical record, interviewed the patient and family, and examined the patient. The following aspects are pertinent.  Past Medical History:  Diagnosis Date  . Anxiety   . Asthma   . Barrett esophagus   . GERD (gastroesophageal reflux disease)   . Hiatal hernia   . IBS (irritable bowel syndrome)   . Migraines   . Pulmonary sarcoidosis ([facility]    Social History   Social History  . Marital status: Married    Spouse name: N/A  . Number of children: N/A  . Years of education: N/A   Occupational History  . Semi Retired-Energy managerat [employer] . Smoking status: Former Smoker    Packs/day: 1.00    Years: 20.00    Types: Cigarettes    Quit date: 09/08/1978  . Smokeless tobacco: Never Used  . Alcohol use No  . Drug use: No  . Sexual activity: Not Asked   Other Topics Concern  . None   Social History Narrative  . None   Family History  Problem Relation Age of Onset  . Adopted: Yes   Scheduled Meds: . arformoterol  15 mcg Nebulization BID  . [START ON 05/05/2017] azithromycin  250 mg Oral  Daily  . bisacodyl  10 mg Rectal Daily  . budesonide (PULMICORT) nebulizer solution  0.5 mg Nebulization BID  . buPROPion  300 mg Oral Daily  . chlorpheniramine-HYDROcodone  5 mL Oral Q12H  . cycloSPORINE  1 drop Both Eyes BID  . enoxaparin (LOVENOX) injection  40 mg Subcutaneous Q24H  . famotidine  20 mg Oral BID  . fluticasone  1 spray Each Nare TID  . lidocaine  1 patch Transdermal Q24H  . loratadine  10 mg Oral Daily  . multivitamin with minerals  1 tablet Oral Daily  . oxyCODONE-acetaminophen  1-2 tablet Oral Q4H  . pantoprazole  40 mg Oral Daily  . polyethylene glycol  17 g Oral Daily  . senna-docusate  1 tablet Oral BID   Continuous Infusions: PRN Meds:.acetaminophen **OR** acetaminophen, benzonatate, fentaNYL (SUBLIMAZE) injection, levalbuterol, LORazepam, morphine injection, ondansetron **OR** ondansetron (ZOFRAN) IV Allergies  Allergen Reactions  . Gabapentin Other (See Comments)    Edema on 2 separate trials  . Lactose Intolerance (Gi) Diarrhea and Other (See Comments)  Stomach cramps  . Penicillins Other (See Comments)    Unknown - childhood allergy Has patient had a PCN reaction causing immediate rash, facial/tongue/throat swelling, SOB or lightheadedness with hypotension: Unknown Has patient had a PCN reaction causing severe rash involving mucus membranes or skin necrosis: Unknown Has patient had a PCN reaction that required hospitalization: Unknown Has patient had a PCN reaction occurring within the last 10 years: No If all of the above answers are "NO", then may proceed with Cephalosporin use.  Marland Kitchen Pineapple Hives and Itching  . Aspirin Other (See Comments)    gastritis   Review of Systems  Constitutional: Positive for appetite change (r/t pain) and fatigue. Negative for activity change and unexpected weight change.  HENT: Positive for trouble swallowing (GERD, Barrett's esophagus). Negative for congestion, facial swelling, hearing loss, sinus pressure and sore  throat.   Eyes: Positive for visual disturbance (blurred vision, non-acute).  Respiratory: Positive for cough and shortness of breath. Negative for chest tightness.   Cardiovascular: Positive for chest pain (r/t chest tube). Negative for palpitations and leg swelling.  Gastrointestinal: Negative for abdominal distention, abdominal pain, constipation, diarrhea, nausea and vomiting.  Genitourinary: Positive for frequency. Negative for difficulty urinating.  Musculoskeletal: Negative for back pain, gait problem and myalgias.  Skin: Negative for color change.  Neurological: Positive for numbness (fingertips). Negative for dizziness, speech difficulty, weakness and light-headedness.  Psychiatric/Behavioral: Negative for confusion, decreased concentration and sleep disturbance. The patient is not nervous/anxious.    Physical Exam  Constitutional: She is oriented to person, place, and time. She appears well-developed and well-nourished. No distress.  HENT:  Head: Normocephalic and atraumatic.  Mouth/Throat: Oropharyngeal exudate (white coating on tongue, however pt reports easily removed w/ burshing) present.  Eyes: EOM are normal.  Neck: Normal range of motion. Neck supple.  Cardiovascular: Regular rhythm.  Tachycardia present.   Pulmonary/Chest: Tachypnea noted. She has decreased breath sounds in the right lower field and the left lower field.  Chest tube, unable to visualize entry site.   Abdominal: Soft. Bowel sounds are normal.  Musculoskeletal: Normal range of motion. She exhibits no edema.  Neurological: She is alert and oriented to person, place, and time.  Skin: Skin is warm and dry.  Psychiatric: She has a normal mood and affect. Her behavior is normal. Judgment and thought content normal.    Vital Signs: BP (!) 88/60 Comment: pt asymptomatic, pt states that is a normal bp for her  Pulse 99   Temp 98.7 F (37.1 C) (Oral)   Resp (!) 22   Ht _0  (1.651 m)   Wt 84.3 kg (185 lb  12.8 oz)   SpO2 93%   BMI 30.92 kg/m  Pain Assessment: 0-10 POSS *See Group Information*: 1-Acceptable,Awake and alert Pain Score: 6    SpO2: SpO2: 93 % O2 Device:SpO2: 93 % O2 Flow Rate: .O2 Flow Rate (L/min): 2 L/min  IO: Intake/output summary:  Intake/Output Summary (Last 24 hours) at 05/04/17 1245 Last data filed at 05/04/17 0100  Gross per 24 hour  Intake              535 ml  Output              150 ml  Net              385 ml    LBM: Last BM Date: 05/02/17 Baseline Weight: Weight: 100.4 kg (221 lb 6.4 oz) Most recent weight: Weight: 84.3 kg (185 lb 12.8 oz)  Palliative Assessment/Data: PPS 70-80%    Time Total: 70 minutes Greater than 50%  of this time was spent counseling and coordinating care related to the above assessment and plan.  Signed by: Charlynn Court, NP Palliative Medicine Team Pager # [phone] (M-F 7a-5p) Team Phone # [phone] (Nights/Weekends)

## 2017-05-04 NOTE — Progress Notes (Addendum)
      [address]       [place] [address]             [phone]       11 Days Post-Op Procedure(s) (LRB): CHEST TUBE INSERTION (Right)  Subjective: Patient with a fair amount of pain at chest tube site. She is concerned that if she goes home with chest tube, not able to get IV Morphine at night when the pain gets very bad.  Objective: Vital signs in last 24 hours: Temp:  [98.2 F (36.8 C)-99.3 F (37.4 C)] 98.7 F (37.1 C) (08/27 0559) Pulse Rate:  [99-107] 99 (08/27 0629) Cardiac Rhythm: Normal sinus rhythm;Sinus tachycardia (08/27 0815) Resp:  [14-22] 22 (08/27 0629) BP: (85-112)/(60-77) 88/60 (08/27 0629) SpO2:  [90 %-97 %] 93 % (08/27 0914) Weight:  [185 lb 12.8 oz (84.3 kg)] 185 lb 12.8 oz (84.3 kg) (08/27 0559)     Intake/Output from previous day: 08/26 0701 - 08/27 0700 In: 1070 [P.O.:1070] Out: 150 [Urine:150]   Physical Exam:  Cardiovascular: RRR Pulmonary: Clear to auscultation on left and coarse on right Wounds: Dressing is clean and dry.   Chest Tube: to water seal, air leak that worsens with cough  Lab Results: CBC: No results for input(s): WBC, HGB, HCT, PLT in the last 72 hours. BMET:  No results for input(s): NA, K, CL, CO2, GLUCOSE, BUN, CREATININE, CALCIUM in the last 72 hours.  PT/INR: No results for input(s): LABPROT, INR in the last 72 hours. ABG:  INR: Will add last result for INR, ABG once components are confirmed Will add last 4 CBG results once components are confirmed  Assessment/Plan:  1. CV - SR, ST. 2.  Pulmonary - Chest tube with 150 cc last 24 hours. Chest tube is to water seal and there is an air leak that worsens with cough. On 2 liters of oxygen via [place]. Patient states she was on 2 liters via [place] at home prior to admission. CXR not ordered but will order for am since TALC to be placed.   ZIMMERMAN,DONIELLE MPA-C 05/04/2017,9:47 AM   Patients O2 sats adequate off suction with stable -moderate R  Pneumothorax Will place talc to help prevent further lung collapse I have also discussed bronchoscopic placement of endobronchial valve(s) to non-invasively reduce the R lung air leak- she will discuss with family and will be evaluated by Dr Dorris Fetch Not a candidate for VATS due to end stage sarcoidosis [DLCO < 30%] and will plan on home care of long term chest tube- needs palliative care eval will place  [facility] face to face orders, DC home after she she recovers from   talc application and after endobronchial valve placed later this week  CXR in am

## 2017-05-04 NOTE — Progress Notes (Signed)
PROGRESS NOTE    [name]   UJW:[id number]  DOB: [birth year]  DOA: 04/18/2017 PCP: Pearline Cables, MD   Brief Narrative:   Kiara Burrough Hyltonis a 66 y.o.femalewith medical history significant of pulmonary sarcoid. Just tapered off of prednisone completely on July 15th. Followed by Dr. Vassie Loll.  Patient presents to the ED with sudden onset of R sided CP and SOB. EMS put her on CPAP, given Albuterol and Atrovent, hypoxic in 80s, CXR with large right sided pneumothorax.    Subjective: Cough with green/yellow sputum today. Continues to have intermittent severe pain in chest at CT site.  ROS: no complaints of nausea, vomiting, diarrhea, cough, dyspnea or dysuria. No other complaints.   Assessment & Plan:   Principal Problem:   Pneumothorax on right - with air leak- CT surgery and pulmonary managing-  Plans for talc via chest tube once air leak resolved -  changed percocet to Q 4 routine rather than PRN for increasing pain- also has PRN Fentanyl - control cough with antitussives - Today states sputum is green- temp of 100.3 noted today (may be from talc?) - started Zpak for the cough- CXR on 8/26 negative for pneumonia - CT surgery guiding care- talc placed today- follow  Active Problems: Constipation - no BM in 10 days - Dulcolax suppositories given x 5 days which allowed her to have daily BMs - given enema on 8/27- had BM after - continue to give daily Miralax and follow  PULMONARY SARCOIDOSIS- dx 1995 /  Chronic respiratory failure with hypoxia   - following with [facility] as far back at 2009   Obesity Body mass index is 37.02 kg/m.   DVT prophylaxis: Lovenox Code Status: Full code Family Communication:  Disposition Plan: home when stable Consultants:   Pulm  CT surgery Procedures:   Chest tube Antimicrobials:  Anti-infectives    Start     Dose/Rate Route Frequency Ordered Stop   05/05/17 1000  azithromycin (ZITHROMAX) tablet 250 mg     250 mg  Oral Daily 05/04/17 0844 05/09/17 0959   05/04/17 1000  azithromycin (ZITHROMAX) tablet 500 mg     500 mg Oral Daily 05/04/17 0844 05/04/17 0945   04/23/17 1230  vancomycin (VANCOCIN) IVPB 1000 mg/200 mL premix     1,000 mg 200 mL/hr over 60 Minutes Intravenous  Once 04/22/17 2043 04/23/17 1714       Objective: Vitals:   05/04/17 0629 05/04/17 0911 05/04/17 0914 05/04/17 1254  BP: (!) 88/60     Pulse: 99     Resp: (!) 22     Temp:    100.3 F (37.9 C)  TempSrc:    Oral  SpO2: 90% 95% 93%   Weight:      Height:        Intake/Output Summary (Last 24 hours) at 05/04/17 1318 Last data filed at 05/04/17 0100  Gross per 24 hour  Intake              535 ml  Output              150 ml  Net              385 ml   Filed Weights   05/02/17 0520 05/03/17 0542 05/04/17 0559  Weight: 99.3 kg (218 lb 14.4 oz) 98.7 kg (217 lb 11.2 oz) 84.3 kg (185 lb 12.8 oz)    Examination: General exam: Appears comfortable  HEENT: PERRLA, oral mucosa moist, no sclera icterus  or thrush Respiratory system: Clear to auscultation. Respiratory effort normal.- CT intact Cardiovascular system: S1 & S2 heard,  No murmurs  Gastrointestinal system: Abdomen soft, non-tender, nondistended. Normal bowel sound. No organomegaly Central nervous system: Alert and oriented. No focal neurological deficits. Extremities: No cyanosis, clubbing or edema Skin: No rashes or ulcers Psychiatry:  Mood & affect appropriate.    Data Reviewed: I have personally reviewed following labs and imaging studies  CBC:  Recent Labs Lab 04/29/17 0359  WBC 6.9  NEUTROABS 3.4  HGB 11.9*  HCT 37.3  MCV 91.4  PLT 284   Basic Metabolic Panel:  Recent Labs Lab 04/29/17 0359  NA 138  K 3.4*  CL 100*  CO2 29  GLUCOSE 105*  BUN 7  CREATININE 1.03*  CALCIUM 9.1   GFR: Estimated Creatinine Clearance: 56 mL/min (A) (by C-G formula based on SCr of 1.03 mg/dL (H)). Liver Function Tests: No results for input(s): AST, ALT,  ALKPHOS, BILITOT, PROT, ALBUMIN in the last 168 hours. No results for input(s): LIPASE, AMYLASE in the last 168 hours. No results for input(s): AMMONIA in the last 168 hours. Coagulation Profile: No results for input(s): INR, PROTIME in the last 168 hours. Cardiac Enzymes: No results for input(s): CKTOTAL, CKMB, CKMBINDEX, TROPONINI in the last 168 hours. BNP (last 3 results) No results for input(s): PROBNP in the last 8760 hours. HbA1C: No results for input(s): HGBA1C in the last 72 hours. CBG: No results for input(s): GLUCAP in the last 168 hours. Lipid Profile: No results for input(s): CHOL, HDL, LDLCALC, TRIG, CHOLHDL, LDLDIRECT in the last 72 hours. Thyroid Function Tests: No results for input(s): TSH, T4TOTAL, FREET4, T3FREE, THYROIDAB in the last 72 hours. Anemia Panel: No results for input(s): VITAMINB12, FOLATE, FERRITIN, TIBC, IRON, RETICCTPCT in the last 72 hours. Urine analysis:    Component Value Date/Time   BILIRUBINUR Negaitve 03/20/2016 1158   PROTEINUR negative 03/20/2016 1158   UROBILINOGEN negative 03/20/2016 1158   NITRITE negative 03/20/2016 1158   LEUKOCYTESUR Negative 03/20/2016 1158   Sepsis Labs: @LABRCNTIP (procalcitonin:4,lacticidven:4) ) No results found for this or any previous visit (from the past 240 hour(s)).       Radiology Studies: Dg Chest Port 1 View  Result Date: 05/03/2017 CLINICAL DATA:  Followup right pneumothorax and atelectasis. Sarcoidosis. EXAM: PORTABLE CHEST 1 VIEW COMPARISON:  05/01/2017 FINDINGS: Right chest tube remains in place. Allowing for more lordotic positioning on current exam, a 20-25% right apical pneumothorax shows no significant change in size. Chronic interstitial lung disease shows no significant interval change. No evidence of superimposed pulmonary consolidation. Heart size is normal. IMPRESSION: No significant change in right apical pneumothorax with chest tube remaining in place. Chronic interstitial lung  disease. Electronically Signed   By: Myles Rosenthal M.D.   On: 05/03/2017 07:43      Scheduled Meds: . arformoterol  15 mcg Nebulization BID  . [START ON 05/05/2017] azithromycin  250 mg Oral Daily  . bisacodyl  10 mg Rectal Daily  . budesonide (PULMICORT) nebulizer solution  0.5 mg Nebulization BID  . buPROPion  300 mg Oral Daily  . chlorpheniramine-HYDROcodone  5 mL Oral Q12H  . cycloSPORINE  1 drop Both Eyes BID  . enoxaparin (LOVENOX) injection  40 mg Subcutaneous Q24H  . famotidine  20 mg Oral BID  . fluticasone  1 spray Each Nare TID  . lidocaine  1 patch Transdermal Q24H  . loratadine  10 mg Oral Daily  . multivitamin with minerals  1 tablet Oral  Daily  . oxyCODONE-acetaminophen  1-2 tablet Oral Q4H  . pantoprazole  40 mg Oral Daily  . polyethylene glycol  17 g Oral Daily  . senna-docusate  1 tablet Oral BID   Continuous Infusions:   LOS: 16 days    Time spent in minutes: 35    Calvert Cantor, MD [facility] Pager: [email or web address] Password [email or web address] 05/04/2017, 1:18 PM

## 2017-05-04 NOTE — Plan of Care (Signed)
Problem: Bowel/Gastric: Goal: Will not experience complications related to bowel motility Outcome: Progressing Patient was able to have bowel movement after enema, patient said that she felt less gastric discomfort today

## 2017-05-04 NOTE — Progress Notes (Signed)
Chaplain noted that the patient is Catholic faith , and requested spiritual care support from the [employer] during their hospital visitation.  Chaplain's will also follow up with the patient for emotional and spiritual care support. Chaplain Janell Quiet [phone]

## 2017-05-04 NOTE — Progress Notes (Signed)
      [address]       [place] [address]             [phone]     Per Dr Zenaida Niece Trigt's instructions, 4 gm od Talc slurry placed in right chest tube  Moderate discomfort but overall tolerated well  Cont to monitor closely   GOLD,[name], PA-C

## 2017-05-04 NOTE — Plan of Care (Signed)
Problem: Nutrition: Goal: Adequate nutrition will be maintained Outcome: Completed/Met Date Met: 05/04/17 Pt eating 75-100% of meals.

## 2017-05-05 ENCOUNTER — Inpatient Hospital Stay (HOSPITAL_COMMUNITY): Payer: Medicare Other

## 2017-05-05 DIAGNOSIS — J209 Acute bronchitis, unspecified: Secondary | ICD-10-CM

## 2017-05-05 MED ORDER — ACETAMINOPHEN 325 MG PO TABS
650.0000 mg | ORAL_TABLET | Freq: Four times a day (QID) | ORAL | Status: DC | PRN
  Administered 2017-05-05 – 2017-05-11 (×5): 650 mg via ORAL
  Filled 2017-05-05 (×5): qty 2

## 2017-05-05 NOTE — Care Management Note (Addendum)
Case Management Note  Patient Details  Name: [name] MRN: [id number] Date of Birth: 1951/02/18  Subjective/Objective: Pt presented for Pulmonary Sarcoidosis- Spontaneous Pneumothorax. Chest Tube with air leak s/p Talc Pleurodesis 05-04-17.  PTA- pt was independent from home. Pt has DME 02 via [employer]. Plan for Video Bronchoscopy with insertion of IBV 05-08-17. Palliative Care is following the patient.                    Action/Plan: CM will continue to monitor for disposition needs.   Expected Discharge Date:  04/24/17               Expected Discharge Plan:  Home w Home Health Services  In-House Referral:     Discharge planning Services  CM Consult  Post Acute Care Choice:    Choice offered to:     DME Arranged:    DME Agency:     HH Arranged:    HH Agency:     Status of Service:  In process, will continue to follow  If discussed at Long Length of Stay Meetings, dates discussed:    Additional Comments:  Gala Lewandowsky, RN 05/05/2017, 3:47 PM

## 2017-05-05 NOTE — Care Management Important Message (Signed)
Important Message  Patient Details  Name: [name] MRN: [id number] Date of Birth: 12/12/1950   Medicare Important Message Given:  Yes    Kyla Balzarine 05/05/2017, 9:12 AM

## 2017-05-05 NOTE — Progress Notes (Addendum)
PROGRESS NOTE    [name]   [name]  DOB: 18-Jul-1951  DOA: 04/18/2017 PCP: Pearline Cables, MD   Brief Narrative:   Kiara Petronio Hyltonis a 66 y.o.femalewith medical history significant of pulmonary sarcoid. Just tapered off of prednisone completely on July 15th. Followed by Dr. Vassie Loll.  Patient presents to the ED with sudden onset of R sided CP and SOB. EMS put her on CPAP, given Albuterol and Atrovent, hypoxic in 80s, CXR with large right sided pneumothorax. Chest tube placed and CT surgery following. Has had a persistent air leak for > 1 wk.    Subjective: Cough with green/yellow sputum has lessened in frequency. Quite a bit of pain from the talc but this is controlled with narcotics.   ROS: no complaints of nausea, vomiting, diarrhea, cough, dyspnea or dysuria. No other complaints.   Assessment & Plan:   Principal Problem:   Pneumothorax on right - with air leak- CT surgery and pulmonary managing  - Percocet increased to Q 4 routine rather than PRN for increasing pain- also has PRN Fentanyl - control cough with antitussives - 8/27- states sputum is green- temp of 100.3 (may be from talc?) - Acute Bronchitis- started Zpak for the cough- CXR on 8/26 negative for pneumonia -  talc placed 8/27 - lots of pain - 8/28- cough better, cont Z- pak, pain controlled with narcotics (palliative care consulted by CT surgery for pain control and GOC)  Active Problems: Constipation - she did not have a BM for about 10 days - Dulcolax suppositories given x 5 days which allowed her to have daily BMs - given enema on 8/27- had BM after - continue to give daily Miralax and follow  PULMONARY SARCOIDOSIS- dx 1995 /  Chronic respiratory failure with hypoxia   - following with [facility] as far back at 2009   Obesity Body mass index is 37.02 kg/m.   DVT prophylaxis: Lovenox Code Status: Full code Family Communication:  Disposition Plan: home when stable Consultants:     Pulm  CT surgery Procedures:   Chest tube Antimicrobials:  Anti-infectives    Start     Dose/Rate Route Frequency Ordered Stop   05/05/17 1000  azithromycin (ZITHROMAX) tablet 250 mg     250 mg Oral Daily 05/04/17 0844 05/09/17 0959   05/04/17 1000  azithromycin (ZITHROMAX) tablet 500 mg     500 mg Oral Daily 05/04/17 0844 05/04/17 0945   04/23/17 1230  vancomycin (VANCOCIN) IVPB 1000 mg/200 mL premix     1,000 mg 200 mL/hr over 60 Minutes Intravenous  Once 04/22/17 2043 04/23/17 1714       Objective: Vitals:   05/05/17 0340 05/05/17 0415 05/05/17 0554 05/05/17 0914  BP:   112/74   Pulse: (!) 109 (!) 105 (!) 105   Resp: (!) 26 (!) 21 (!) 22   Temp:   98.9 F (37.2 C)   TempSrc:   Oral   SpO2: 98% 96% 96% 93%  Weight:   97.7 kg (215 lb 6.4 oz)   Height:        Intake/Output Summary (Last 24 hours) at 05/05/17 1016 Last data filed at 05/05/17 0600  Gross per 24 hour  Intake              480 ml  Output              700 ml  Net             -220 ml  Filed Weights   05/03/17 0542 05/04/17 0559 05/05/17 0554  Weight: 98.7 kg (217 lb 11.2 oz) 84.3 kg (185 lb 12.8 oz) 97.7 kg (215 lb 6.4 oz)    Examination: General exam: Appears comfortable  HEENT: PERRLA, oral mucosa moist, no sclera icterus or thrush Respiratory system: Clear to auscultation. Respiratory effort normal.- chest  Tube present in right chest wall Cardiovascular system: S1 & S2 heard,  No murmurs  Gastrointestinal system: Abdomen soft, non-tender, nondistended. Normal bowel sound. No organomegaly Central nervous system: Alert and oriented. No focal neurological deficits. Extremities: No cyanosis, clubbing or edema Skin: No rashes or ulcers Psychiatry:  Mood & affect appropriate.    Data Reviewed: I have personally reviewed following labs and imaging studies  CBC:  Recent Labs Lab 04/29/17 0359  WBC 6.9  NEUTROABS 3.4  HGB 11.9*  HCT 37.3  MCV 91.4  PLT 284   Basic Metabolic  Panel:  Recent Labs Lab 04/29/17 0359  NA 138  K 3.4*  CL 100*  CO2 29  GLUCOSE 105*  BUN 7  CREATININE 1.03*  CALCIUM 9.1   GFR: Estimated Creatinine Clearance: 60.5 mL/min (A) (by C-G formula based on SCr of 1.03 mg/dL (H)). Liver Function Tests: No results for input(s): AST, ALT, ALKPHOS, BILITOT, PROT, ALBUMIN in the last 168 hours. No results for input(s): LIPASE, AMYLASE in the last 168 hours. No results for input(s): AMMONIA in the last 168 hours. Coagulation Profile: No results for input(s): INR, PROTIME in the last 168 hours. Cardiac Enzymes: No results for input(s): CKTOTAL, CKMB, CKMBINDEX, TROPONINI in the last 168 hours. BNP (last 3 results) No results for input(s): PROBNP in the last 8760 hours. HbA1C: No results for input(s): HGBA1C in the last 72 hours. CBG: No results for input(s): GLUCAP in the last 168 hours. Lipid Profile: No results for input(s): CHOL, HDL, LDLCALC, TRIG, CHOLHDL, LDLDIRECT in the last 72 hours. Thyroid Function Tests: No results for input(s): TSH, T4TOTAL, FREET4, T3FREE, THYROIDAB in the last 72 hours. Anemia Panel: No results for input(s): VITAMINB12, FOLATE, FERRITIN, TIBC, IRON, RETICCTPCT in the last 72 hours. Urine analysis:    Component Value Date/Time   BILIRUBINUR Negaitve 03/20/2016 1158   PROTEINUR negative 03/20/2016 1158   UROBILINOGEN negative 03/20/2016 1158   NITRITE negative 03/20/2016 1158   LEUKOCYTESUR Negative 03/20/2016 1158   Sepsis Labs: @LABRCNTIP (procalcitonin:4,lacticidven:4) ) No results found for this or any previous visit (from the past 240 hour(s)).       Radiology Studies: Dg Chest Port 1 View  Result Date: 05/05/2017 CLINICAL DATA:  Right pneumothorax EXAM: PORTABLE CHEST 1 VIEW COMPARISON:  Two days ago FINDINGS: Right pneumothorax is decreased from prior, now less than 5%. Right-sided chest tube is somewhat shorter than before, but side-port still projecting in side the pleural cavity.  Low volume chest with coarse opacities mainly at the bases. Apparent cardiomegaly, accentuated by technique. Enlarged hila, reference chest CT 04/19/2017. IMPRESSION: 1. Decreased right apical pneumothorax, now less than 5%. The right-sided chest tube is slightly shorter than before. 2. Pulmonary fibrosis. Electronically Signed   By: Marnee Spring M.D.   On: 05/05/2017 07:48      Scheduled Meds: . arformoterol  15 mcg Nebulization BID  . azithromycin  250 mg Oral Daily  . bisacodyl  10 mg Rectal Daily  . budesonide (PULMICORT) nebulizer solution  0.5 mg Nebulization BID  . buPROPion  300 mg Oral Daily  . chlorpheniramine-HYDROcodone  5 mL Oral Q12H  . cycloSPORINE  1  drop Both Eyes BID  . enoxaparin (LOVENOX) injection  40 mg Subcutaneous Q24H  . famotidine  20 mg Oral BID  . fluticasone  1 spray Each Nare TID  . lidocaine  1 patch Transdermal Q24H  . loratadine  10 mg Oral Daily  . multivitamin with minerals  1 tablet Oral Daily  . oxyCODONE-acetaminophen  1 tablet Oral Q4H  . pantoprazole  40 mg Oral Daily  . polyethylene glycol  17 g Oral Daily  . senna-docusate  1 tablet Oral BID   Continuous Infusions:   LOS: 17 days    Time spent in minutes: 35    Calvert Cantor, MD [facility] Pager: [email or web address] Password [email or web address] 05/05/2017, 10:16 AM

## 2017-05-05 NOTE — Progress Notes (Signed)
      [address]       [place] [address]             [phone]      12 Days Post-Op Procedure(s) (LRB): CHEST TUBE INSERTION (Right)   Subjective:  Patient having a lot of discomfort. Not much relief with pain medication.  Objective: Vital signs in last 24 hours: Temp:  [98.7 F (37.1 C)-100.3 F (37.9 C)] 98.9 F (37.2 C) (08/28 0554) Pulse Rate:  [105-119] 105 (08/28 0554) Cardiac Rhythm: Sinus tachycardia (08/28 0400) Resp:  [21-28] 22 (08/28 0554) BP: (112-126)/(74-81) 112/74 (08/28 0554) SpO2:  [93 %-98 %] 93 % (08/28 0914) Weight:  [215 lb 6.4 oz (97.7 kg)] 215 lb 6.4 oz (97.7 kg) (08/28 0554)  Intake/Output from previous day: 08/27 0701 - 08/28 0700 In: 480 [P.O.:480] Out: 1000 [Urine:900; Chest Tube:100]  General appearance: alert, cooperative and no distress Heart: regular rate and rhythm Lungs: clear to auscultation bilaterally Abdomen: soft, non-tender; bowel sounds normal; no masses,  no organomegaly Extremities: extremities normal, atraumatic, no cyanosis or edema Wound: clean and dry  Lab Results: No results for input(s): WBC, HGB, HCT, PLT in the last 72 hours. BMET: No results for input(s): NA, K, CL, CO2, GLUCOSE, BUN, CREATININE, CALCIUM in the last 72 hours.  PT/INR: No results for input(s): LABPROT, INR in the last 72 hours. ABG    Component Value Date/Time   HCO3 26.6 04/18/2017 1746   TCO2 28 04/18/2017 1746   O2SAT 92.0 04/18/2017 1746   CBG (last 3)  No results for input(s): GLUCAP in the last 72 hours.  Assessment/Plan: S/P Procedure(s) (LRB): CHEST TUBE INSERTION (Right)  1. Chest tube- S/P Talc Pleurodesis, + 1 air leak..... CXR shows improved apcial pneumothorax 2. Pain control- patient not receiving much pain medication, instructed patient on what is available to her 3. Dispo- care per primary, possibly transition chest tube to [facility] soon   LOS: 17 days    BARRETT, Denny Peon 05/05/2017

## 2017-05-05 NOTE — Progress Notes (Signed)
Daily Progress Note   Patient Name: [name]       Date: 05/05/2017 DOB: 1950/12/13  Age: 66 y.o. MRN#: [id number] Attending Physician: Calvert Cantor, MD Primary Care Physician: Pearline Cables, MD Admit Date: 04/18/2017  Reason for Consultation/Follow-up: Pain control  Subjective: Mrs. Odonoghue is feeling a little better compared to yesterday, however her pain is still more significant compared to the day before. She feels the pain is slowly improving and should be under much better control by tomorrow. Beyond some mild lethargy and pain, she has no other complaints or concerns.   Length of Stay: 17  Current Medications: Scheduled Meds:  . arformoterol  15 mcg Nebulization BID  . azithromycin  250 mg Oral Daily  . bisacodyl  10 mg Rectal Daily  . budesonide (PULMICORT) nebulizer solution  0.5 mg Nebulization BID  . buPROPion  300 mg Oral Daily  . chlorpheniramine-HYDROcodone  5 mL Oral Q12H  . cycloSPORINE  1 drop Both Eyes BID  . enoxaparin (LOVENOX) injection  40 mg Subcutaneous Q24H  . famotidine  20 mg Oral BID  . fluticasone  1 spray Each Nare TID  . lidocaine  1 patch Transdermal Q24H  . loratadine  10 mg Oral Daily  . multivitamin with minerals  1 tablet Oral Daily  . oxyCODONE-acetaminophen  1 tablet Oral Q4H  . pantoprazole  40 mg Oral Daily  . polyethylene glycol  17 g Oral Daily  . senna-docusate  1 tablet Oral BID    Continuous Infusions:   PRN Meds: benzonatate, levalbuterol, LORazepam, morphine injection, ondansetron **OR** ondansetron (ZOFRAN) IV  Physical Exam   Constitutional: She is oriented to person, place, and time. She appears well-developed and well-nourished. No distress.  HENT:  Head: Normocephalic and atraumatic.  Mouth/Throat: Oropharyngeal exudate (white coating on tongue, however pt  reports easily removed w/ burshing) present.  Eyes: EOM are normal.  Neck: Normal range of motion. Neck supple.  Cardiovascular: Regular rhythm.  Tachycardia present.   Pulmonary/Chest: Tachypnea noted. She has decreased breath sounds in the right lower field and the left lower field.  Chest tube  Abdominal: Soft. Bowel sounds are normal.  Musculoskeletal: Normal range of motion. She exhibits no edema.  Neurological: She is alert and oriented to person, place, and time.  Skin: Skin is warm and dry.  Psychiatric: She has a normal mood and affect. Her behavior is normal. Judgment and thought content normal.         Vital Signs: BP 95/68 (BP Location: Right Arm)   Pulse 100   Temp 98.6 F (37 C) (Oral)   Resp 19   Ht 5\' 5"  (1.651 m)   Wt 97.7 kg (215 lb 6.4 oz)   SpO2 98%   BMI 35.84 kg/m  SpO2: SpO2: 98 % O2 Device: O2 Device: Nasal Cannula O2 Flow Rate: O2 Flow Rate (L/min): 3 L/min  Intake/output summary:  Intake/Output Summary (Last 24 hours) at 05/05/17 1417 Last data filed at 05/05/17 0900  Gross per 24 hour  Intake              702 ml  Output  500 ml  Net              202 ml   LBM: Last BM Date: 05/02/17 Baseline Weight: Weight: 100.4 kg (221 lb 6.4 oz) Most recent weight: Weight: 97.7 kg (215 lb 6.4 oz)  Palliative Assessment/Data: PPS 70-80%    Patient Active Problem List   Diagnosis Date Noted  . Chest tube in place   . Goals of care, counseling/discussion   . Palliative care by specialist   . Pneumothorax on right 04/18/2017  . Spontaneous pneumothorax 04/18/2017  . SOB (shortness of breath) 06/05/2016  . Osteopenia 02/12/2016  . Lumbar radiculopathy 01/26/2015  . Chronic respiratory failure with hypoxia (HCC) 04/12/2014  . Obesity (BMI 30-39.9) 02/08/2014  . Allergic rhinitis 09/05/2010  . IBS 09/05/2010  . GERD 02/18/2010  . Barrett's esophagus 02/12/2010  . Upper airway cough syndrome 11/02/2009  . Anxiety state 08/29/2008  . History  of colonic polyps 08/29/2008  . PULMONARY SARCOIDOSIS 08/12/2007  . Mild persistent asthma in adult without complication with component of vcd 08/12/2007  . Diaphragmatic hernia 08/12/2007  . Migraine 08/12/2007    Palliative Care Assessment & Plan   HPI: 66 y.o. female  with past medical history of pulmonary sarcoid (diagnosed in 1995, followed by Dr. Vassie Loll, just tapered off Prednisone July 15th), asthma, anxiety, and GERD. She presented to the ED from home with acute onset right sided chest pain and shortness of breath. Imaging in ED noted large right pneumothorax with complete lung collapse. She was admitted on 04/18/2017. Cardiothoracic surgery following, s/p chest tube placement and talc placement (to prevent further lung collapse). Unfortunately, she is not a candidate for VATS due to end stage sarcoidosis. Plan for long term chest-tube. Palliative consulted for goals of care.  Assessment: Mrs. Cochrane continues to endorse significant pain. It has improved since yesterday, though remains worse in comparison to the day before. We talked through the expected timeline for pain improvement, as well as management approach now and after discharge. Mrs. Steffler felt her pain was very slowly improving. She asked to have one more day to utilize IV pain medication, then wanted to switch to all oral medication tomorrow (for both scheduled and PRN doses). She would ideally like to be off schedule pain medication entirely and just have PRN doses available. I think this is a reasonable goal.  Finally, I did offer her a copy of Living Will and MOST form papers to help guide her conversations with her family. She was appreciative of the offer but declined them. She felt she already had a good understanding of what needed to be talked about. If she was struggling with certain issues, she planned to research conversation outlines online.   Recommendations/Plan:  Full code, full scope aggressive  treatment  Continue pain medication as is for now. Plan to transition to all oral medication tomorrow  Goals of Care and Additional Recommendations:  Limitations on Scope of Treatment: Full Scope Treatment  Code Status:  Full code  Prognosis:   Unable to determine  Discharge Planning:  Home with Home Health  Care plan was discussed with pt.   Thank you for allowing the Palliative Medicine Team to assist in the care of this patient.  Total time: 25 minutes    Greater than 50%  of this time was spent counseling and coordinating care related to the above assessment and plan.  Murrell Converse, NP Palliative Medicine Team [phone] pager (7a-5p) Team Phone # [phone]

## 2017-05-05 NOTE — Progress Notes (Signed)
Notified Dr. Butler Denmark of patien'ts temp of 100.3.  No new orders given.  Will continue to monitor.

## 2017-05-06 ENCOUNTER — Inpatient Hospital Stay (HOSPITAL_COMMUNITY): Payer: Medicare Other

## 2017-05-06 DIAGNOSIS — J9312 Secondary spontaneous pneumothorax: Secondary | ICD-10-CM

## 2017-05-06 LAB — CBC WITH DIFFERENTIAL/PLATELET
BASOS PCT: 2 %
Basophils Absolute: 0.2 10*3/uL — ABNORMAL HIGH (ref 0.0–0.1)
EOS ABS: 0.5 10*3/uL (ref 0.0–0.7)
Eosinophils Relative: 5 %
HEMATOCRIT: 35.6 % — AB (ref 36.0–46.0)
Hemoglobin: 11.3 g/dL — ABNORMAL LOW (ref 12.0–15.0)
LYMPHS ABS: 1.6 10*3/uL (ref 0.7–4.0)
Lymphocytes Relative: 16 %
MCH: 29.4 pg (ref 26.0–34.0)
MCHC: 31.7 g/dL (ref 30.0–36.0)
MCV: 92.7 fL (ref 78.0–100.0)
MONO ABS: 1.6 10*3/uL — AB (ref 0.1–1.0)
MONOS PCT: 16 %
Neutro Abs: 6.2 10*3/uL (ref 1.7–7.7)
Neutrophils Relative %: 61 %
Platelets: 286 10*3/uL (ref 150–400)
RBC: 3.84 MIL/uL — ABNORMAL LOW (ref 3.87–5.11)
RDW: 13.1 % (ref 11.5–15.5)
WBC: 10 10*3/uL (ref 4.0–10.5)

## 2017-05-06 LAB — LACTIC ACID, PLASMA
LACTIC ACID, VENOUS: 0.7 mmol/L (ref 0.5–1.9)
Lactic Acid, Venous: 0.7 mmol/L (ref 0.5–1.9)

## 2017-05-06 LAB — PROCALCITONIN: Procalcitonin: 0.44 ng/mL

## 2017-05-06 LAB — BASIC METABOLIC PANEL
Anion gap: 8 (ref 5–15)
BUN: 7 mg/dL (ref 6–20)
CALCIUM: 8.7 mg/dL — AB (ref 8.9–10.3)
CO2: 28 mmol/L (ref 22–32)
CREATININE: 1.04 mg/dL — AB (ref 0.44–1.00)
Chloride: 97 mmol/L — ABNORMAL LOW (ref 101–111)
GFR calc non Af Amer: 54 mL/min — ABNORMAL LOW (ref >60)
Glucose, Bld: 100 mg/dL — ABNORMAL HIGH (ref 65–99)
Potassium: 4.3 mmol/L (ref 3.5–5.1)
SODIUM: 133 mmol/L — AB (ref 135–145)

## 2017-05-06 MED ORDER — MORPHINE SULFATE (PF) 2 MG/ML IV SOLN
2.0000 mg | INTRAVENOUS | Status: DC | PRN
  Administered 2017-05-07 – 2017-05-11 (×4): 2 mg via INTRAVENOUS
  Filled 2017-05-06 (×5): qty 1

## 2017-05-06 MED ORDER — OXYCODONE HCL 5 MG PO TABS
5.0000 mg | ORAL_TABLET | Freq: Every day | ORAL | Status: DC
  Administered 2017-05-06 – 2017-05-11 (×6): 5 mg via ORAL
  Filled 2017-05-06 (×6): qty 1

## 2017-05-06 MED ORDER — OXYCODONE HCL 5 MG PO TABS
5.0000 mg | ORAL_TABLET | ORAL | Status: DC | PRN
  Administered 2017-05-06 – 2017-05-10 (×2): 5 mg via ORAL
  Filled 2017-05-06 (×3): qty 1

## 2017-05-06 MED ORDER — CLOTRIMAZOLE 10 MG MT TROC
10.0000 mg | Freq: Every day | OROMUCOSAL | Status: DC
  Administered 2017-05-06 – 2017-05-12 (×25): 10 mg via ORAL
  Filled 2017-05-06 (×35): qty 1

## 2017-05-06 MED ORDER — OXYCODONE HCL 5 MG PO TABS
5.0000 mg | ORAL_TABLET | ORAL | Status: DC
  Administered 2017-05-06 – 2017-05-11 (×22): 5 mg via ORAL
  Filled 2017-05-06 (×21): qty 1

## 2017-05-06 MED ORDER — OXYCODONE HCL 5 MG PO TABS: 5.0000 mg | ORAL_TABLET | ORAL | Status: DC

## 2017-05-06 NOTE — Progress Notes (Addendum)
Daily Progress Note   Patient Name: [name]       Date: 05/06/2017 DOB: 1950-10-14  Age: 66 y.o. MRN#: [id number] Attending Physician: Kathlen ModyAkula, Vijaya, MD Primary Care Physician: Pearline Cablesopland, Jessica C, MD Admit Date: 04/18/2017  Reason for Consultation/Follow-up: Pain control and Psychosocial/spiritual support  Subjective: [name] is feeling much better compared to yesterday. She is now able to move around in bed and perform self-care activities. She required break-though pain medication around midnight, but none needed since. She has no complaints this morning.  Length of Stay: 18  Current Medications: Scheduled Meds:  . arformoterol  15 mcg Nebulization BID  . azithromycin  250 mg Oral Daily  . bisacodyl  10 mg Rectal Daily  . budesonide (PULMICORT) nebulizer solution  0.5 mg Nebulization BID  . buPROPion  300 mg Oral Daily  . chlorpheniramine-HYDROcodone  5 mL Oral Q12H  . clotrimazole  10 mg Oral 5 X Daily  . cycloSPORINE  1 drop Both Eyes BID  . enoxaparin (LOVENOX) injection  40 mg Subcutaneous Q24H  . famotidine  20 mg Oral BID  . fluticasone  1 spray Each Nare TID  . lidocaine  1 patch Transdermal Q24H  . loratadine  10 mg Oral Daily  . multivitamin with minerals  1 tablet Oral Daily  . oxyCODONE  5 mg Oral Q4H while awake   And  . oxyCODONE  5 mg Oral QHS  . pantoprazole  40 mg Oral Daily  . polyethylene glycol  17 g Oral Daily  . senna-docusate  1 tablet Oral BID    Continuous Infusions:   PRN Meds: acetaminophen, levalbuterol, LORazepam, oxyCODONE **OR** morphine injection, ondansetron **OR** ondansetron (ZOFRAN) IV  Physical Exam   Constitutional: She is oriented to person, place, and time. She appears well-developed and well-nourished. No distress.  HENT:  Head: Normocephalic and atraumatic.    Mouth/Throat: Oropharyngeal exudate (seen post brushing, oral candidiasis) present.  Eyes: EOM are normal.  Neck: Normal range of motion. Neck supple.  Cardiovascular: Regular rhythm.  Tachycardia present.   Pulmonary/Chest: Tachypnea noted. She has decreased breath sounds in the right lower field and the left lower field.  Chest tube  Abdominal: Soft. Bowel sounds are normal.  Musculoskeletal: Normal range of motion. She exhibits no edema.  Neurological: She is alert and oriented to person, place, and time.  Skin: Skin is warm and dry.  Psychiatric: She has a normal mood and affect. Her behavior is normal. Judgment and thought content normal.         Vital Signs: BP 95/69   Pulse (!) 106   Temp 99.5 F (37.5 C) (Oral)   Resp (!) 21   Ht 5\' 5"  (1.651 m)   Wt 97.7 kg (215 lb 6.4 oz)   SpO2 96%   BMI 35.84 kg/m  SpO2: SpO2: 96 % O2 Device: O2 Device: Nasal Cannula O2 Flow Rate: O2 Flow Rate (L/min): 3 L/min  Intake/output summary:   Intake/Output Summary (Last 24 hours) at 05/06/17 1018 Last data filed at 05/06/17 1016  Gross per 24 hour  Intake              120 ml  Output  1030 ml  Net             -910 ml   LBM: Last BM Date: 05/02/17 Baseline Weight: Weight: 100.4 kg (221 lb 6.4 oz) Most recent weight: Weight: 97.7 kg (215 lb 6.4 oz)  Palliative Assessment/Data: PPS 70-80%    Patient Active Problem List   Diagnosis Date Noted  . Chest tube in place   . Goals of care, counseling/discussion   . Palliative care by specialist   . Pneumothorax on right 04/18/2017  . Spontaneous pneumothorax 04/18/2017  . SOB (shortness of breath) 06/05/2016  . Osteopenia 02/12/2016  . Lumbar radiculopathy 01/26/2015  . Chronic respiratory failure with hypoxia (HCC) 04/12/2014  . Obesity (BMI 30-39.9) 02/08/2014  . Allergic rhinitis 09/05/2010  . IBS 09/05/2010  . GERD 02/18/2010  . Barrett's esophagus 02/12/2010  . Upper airway cough syndrome 11/02/2009  . Anxiety  state 08/29/2008  . History of colonic polyps 08/29/2008  . PULMONARY SARCOIDOSIS 08/12/2007  . Mild persistent asthma in adult without complication with component of vcd 08/12/2007  . Diaphragmatic hernia 08/12/2007  . Migraine 08/12/2007    Palliative Care Assessment & Plan   HPI: 66 y.o. female  with past medical history of pulmonary sarcoid (diagnosed in 1995, followed by Dr. Vassie Loll, just tapered off Prednisone July 15th), asthma, anxiety, and GERD. She presented to the ED from home with acute onset right sided chest pain and shortness of breath. Imaging in ED noted large right pneumothorax with complete lung collapse. She was admitted on 04/18/2017. Cardiothoracic surgery following, s/p chest tube placement and talc placement (to prevent further lung collapse). Unfortunately, she is not a candidate for VATS due to end stage sarcoidosis. Plan for long term chest-tube. Palliative consulted for goals of care.  Assessment: [name] has struggled with pain control since placement of chest tube then after talc placement. Yesterday she had significant pain at rest and we discussed using PRN morphine in addition to her schedule Percocet. Today, she is feeling much better and able to perform self care activities with only brief periods of pain exacerbation (associated with certain movements and coughing). We discussed her pain medication. Her biggest issue is increased pain at night that prevents her from falling asleep. Once asleep, she feels she is comfortable and only has issues when she is woken. Her goal is to use the smallest amount of oral pain medication as possible to remain comfortable.   Recommendations/Plan:  Full code, full scope aggressive treatment  Thrush noted on tongue, clotrimazole troche ordered. Pt encouraged to rinse mouth after inhaler use.  Pain Management Change scheduled Percocet to scheduled Oxycodone and PRN Tylenol. The Oxycodone will now be every four hours while  awake, and then an extra dose at bedside.   Change PRN morphine from 2-4mg  to 2mg , and now have the option of first trying PRN Oxycodone. The goal is to transition her off of IV pain medication, but leave it available as second line back-up for now.  Goals of Care and Additional Recommendations:  Limitations on Scope of Treatment: Full Scope Treatment  Code Status:  Full code  Prognosis:   Unable to determine  Discharge Planning:  Home with Home Health  Care plan was discussed with pt.   Thank you for allowing the Palliative Medicine Team to assist in the care of this patient.  Total time: 25 minutes    Greater than 50%  of this time was spent counseling and coordinating care related to the above assessment  and plan.  Charlynn Court, NP Palliative Medicine Team [phone] pager (7a-5p) Team Phone # [phone]

## 2017-05-06 NOTE — Progress Notes (Signed)
Pt had temp of 102. Notified MD. New Orders placed for labs and medication.

## 2017-05-06 NOTE — Progress Notes (Addendum)
PROGRESS NOTE    [name]  WGN:[id number] DOB: 06-16-1951 DOA: 04/18/2017 PCP: Pearline Cables, MD    Brief Narrative:[name] is a 66 y.o. female with medical history significant of pulmonary sarcoidosis, follows up with [name] presents with sudden onset of chest pain or sob. She was found to have large right pneumothorax with complete collapse. Cardiothoracic surgery consulted and she underwent chest tube placement on the rigth on 8/16, and s/p talk pleurodesis.  Over the last one week, she has air leek and repeat CXR this am shows increase in the pneumothorax to 20%.  Another issue is her pain at the site of the chest tube, which is better today. Plan is when the pneumothorax improves, to transition to mini express.   Assessment & Plan:   Principal Problem:   Pneumothorax on right Active Problems:   PULMONARY SARCOIDOSIS   Mild persistent asthma in adult without complication with component of vcd   Chronic respiratory failure with hypoxia (HCC)   Spontaneous pneumothorax   Chest tube in place   Goals of care, counseling/discussion   Palliative care by specialist  Right sided pneumothorax:  Air leak over the last one week, increase in the pneumothorax.  Cardiothoracic surgery following.  Pain control. And anti biotic to complete the course.  S/p talc pleurodesis.     Pulmonary sarcoidosis:  H/o chronic respiratory failure with hypoxia on 2l it of [place] oxygen at home.  Follows up with [name], no exacerbation this admission.     Constipation:  From pain meds.  Resume miralax, senna colace and dulcolax.     Febrile overnight:  No new pneumonia on cxr.  Send UA as her urine is tea colored.  Lactic acid is 0.7.  No leukocytosis.  Blood cultures ordered and pending.      Mild normocytic anemia:  Possibly from anemia of chronic disease.     DVT prophylaxis: (Lovenox) Code Status: (Full/) Family Communication: (none at bedside,offered to talk to  the daughter if they have questions.  Disposition Plan: pending resolution of the pneumothorax.    Consultants:   Cardiothoracic surgery.   pulmonology   Procedures: chest tube placement.    Antimicrobials: azithromycin till august 31st.    Subjective: Reports last night was rough.  Febrile overnight. Currently afebrile.  No chest pain right now.  Sob improved.   Objective: Vitals:   05/06/17 0047 05/06/17 0157 05/06/17 0540 05/06/17 0853  BP: 98/72  95/69 95/69  Pulse: (!) 106  97 (!) 106  Resp: (!) 21  16 (!) 21  Temp:  99.6 F (37.6 C) 99.5 F (37.5 C)   TempSrc:  Oral Oral   SpO2: 95%  98% 96%  Weight:      Height:        Intake/Output Summary (Last 24 hours) at 05/06/17 1228 Last data filed at 05/06/17 1016  Gross per 24 hour  Intake              120 ml  Output             1030 ml  Net             -910 ml   Filed Weights   05/03/17 0542 05/04/17 0559 05/05/17 0554  Weight: 98.7 kg (217 lb 11.2 oz) 84.3 kg (185 lb 12.8 oz) 97.7 kg (215 lb 6.4 oz)    Examination:  General exam: Appears calm and comfortable on 3 lit of [place] oxygen.  Respiratory  system: Clear to auscultation. Respiratory effort normal. Cardiovascular system: S1 & S2 heard, RRR. No JVD, murmurs, rubs, gallops or clicks. No pedal edema. Gastrointestinal system: Abdomen is nondistended, soft and nontender. No organomegaly or masses felt. Normal bowel sounds heard. Central nervous system: Alert and oriented. No focal neurological deficits. Extremities: Symmetric 5 x 5 power. Skin: No rashes, lesions or ulcers Psychiatry: Judgement and insight appear normal. Mood & affect appropriate.     Data Reviewed: I have personally reviewed following labs and imaging studies  CBC:  Recent Labs Lab 05/05/17 2330  WBC 10.0  NEUTROABS 6.2  HGB 11.3*  HCT 35.6*  MCV 92.7  PLT 286   Basic Metabolic Panel:  Recent Labs Lab 05/05/17 2330  NA 133*  K 4.3  CL 97*  CO2 28  GLUCOSE 100*  BUN  7  CREATININE 1.04*  CALCIUM 8.7*   GFR: Estimated Creatinine Clearance: 59.9 mL/min (A) (by C-G formula based on SCr of 1.04 mg/dL (H)). Liver Function Tests: No results for input(s): AST, ALT, ALKPHOS, BILITOT, PROT, ALBUMIN in the last 168 hours. No results for input(s): LIPASE, AMYLASE in the last 168 hours. No results for input(s): AMMONIA in the last 168 hours. Coagulation Profile: No results for input(s): INR, PROTIME in the last 168 hours. Cardiac Enzymes: No results for input(s): CKTOTAL, CKMB, CKMBINDEX, TROPONINI in the last 168 hours. BNP (last 3 results) No results for input(s): PROBNP in the last 8760 hours. HbA1C: No results for input(s): HGBA1C in the last 72 hours. CBG: No results for input(s): GLUCAP in the last 168 hours. Lipid Profile: No results for input(s): CHOL, HDL, LDLCALC, TRIG, CHOLHDL, LDLDIRECT in the last 72 hours. Thyroid Function Tests: No results for input(s): TSH, T4TOTAL, FREET4, T3FREE, THYROIDAB in the last 72 hours. Anemia Panel: No results for input(s): VITAMINB12, FOLATE, FERRITIN, TIBC, IRON, RETICCTPCT in the last 72 hours. Sepsis Labs:  Recent Labs Lab 05/05/17 2330 05/06/17 0206  PROCALCITON 0.44  --   LATICACIDVEN 0.7 0.7    No results found for this or any previous visit (from the past 240 hour(s)).       Radiology Studies: Dg Chest Port 1 View  Result Date: 05/06/2017 CLINICAL DATA:  Right chest tube positioning. EXAM: PORTABLE CHEST 1 VIEW COMPARISON:  Multiple prior chest x-rays, most recently from yesterday. FINDINGS: Right-sided chest tube in place with interval increase in size of the small right pneumothorax, now approximately 20%. The chest tube side port is now just inside the inner margin of the ribs. Unchanged low lung volumes with coarsened basilar predominant opacities. The enlarged cardiomediastinal silhouette is unchanged. IMPRESSION: 1. Interval increase in size of the small right pneumothorax, now  approximately 20%. The chest tube side port projects just inside the inner margin of the ribs and could potentially be partially extrapleural. 2. Unchanged pulmonary fibrosis. These results will be called to the ordering clinician or representative by the Radiologist Assistant, and communication documented in the PACS or zVision Dashboard. Electronically Signed   By: Obie DredgeWilliam T Derry M.D.   On: 05/06/2017 08:23   Dg Chest Port 1 View  Result Date: 05/05/2017 CLINICAL DATA:  Right pneumothorax EXAM: PORTABLE CHEST 1 VIEW COMPARISON:  Two days ago FINDINGS: Right pneumothorax is decreased from prior, now less than 5%. Right-sided chest tube is somewhat shorter than before, but side-port still projecting in side the pleural cavity. Low volume chest with coarse opacities mainly at the bases. Apparent cardiomegaly, accentuated by technique. Enlarged hila, reference chest CT 04/19/2017.  IMPRESSION: 1. Decreased right apical pneumothorax, now less than 5%. The right-sided chest tube is slightly shorter than before. 2. Pulmonary fibrosis. Electronically Signed   By: Marnee Spring M.D.   On: 05/05/2017 07:48        Scheduled Meds: . arformoterol  15 mcg Nebulization BID  . azithromycin  250 mg Oral Daily  . bisacodyl  10 mg Rectal Daily  . budesonide (PULMICORT) nebulizer solution  0.5 mg Nebulization BID  . buPROPion  300 mg Oral Daily  . chlorpheniramine-HYDROcodone  5 mL Oral Q12H  . clotrimazole  10 mg Oral 5 X Daily  . cycloSPORINE  1 drop Both Eyes BID  . enoxaparin (LOVENOX) injection  40 mg Subcutaneous Q24H  . famotidine  20 mg Oral BID  . fluticasone  1 spray Each Nare TID  . lidocaine  1 patch Transdermal Q24H  . loratadine  10 mg Oral Daily  . multivitamin with minerals  1 tablet Oral Daily  . oxyCODONE  5 mg Oral Q4H while awake   And  . oxyCODONE  5 mg Oral QHS  . pantoprazole  40 mg Oral Daily  . polyethylene glycol  17 g Oral Daily  . senna-docusate  1 tablet Oral BID    Continuous Infusions:   LOS: 18 days    Time spent: 35 minutes.     Kathlen Mody, MD [facility] Pager [phone]   If 7PM-7AM, please contact night-coverage [email or web address] Password [facility] 05/06/2017, 12:28 PM

## 2017-05-06 NOTE — Progress Notes (Addendum)
[address]       [place] [id number]             [phone]      13 Days Post-Op Procedure(s) (LRB): CHEST TUBE INSERTION (Right) Subjective: Pain is controlled a little better  Objective: Vital signs in last 24 hours: Temp:  [98.6 F (37 C)-102.1 F (38.9 C)] 99.5 F (37.5 C) (08/29 0540) Pulse Rate:  [97-106] 97 (08/29 0540) Cardiac Rhythm: Sinus tachycardia (08/29 0705) Resp:  [16-23] 16 (08/29 0540) BP: (95-110)/(68-87) 95/69 (08/29 0540) SpO2:  [93 %-98 %] 98 % (08/29 0540)  Hemodynamic parameters for last 24 hours:    Intake/Output from previous day: 08/28 0701 - 08/29 0700 In: 222 [P.O.:222] Out: 780 [Urine:600; Chest Tube:180] Intake/Output this shift: No intake/output data recorded.  General appearance: alert, cooperative and no distress Heart: regular rate and rhythm Lungs: slight coarsenessin lower fields Abdomen: soft, non tender Extremities: no calf tenderness Wound: tube site ok  Lab Results:  Recent Labs  05/05/17 2330  WBC 10.0  HGB 11.3*  HCT 35.6*  PLT 286   BMET:  Recent Labs  05/05/17 2330  NA 133*  K 4.3  CL 97*  CO2 28  GLUCOSE 100*  BUN 7  CREATININE 1.04*  CALCIUM 8.7*    PT/INR: No results for input(s): LABPROT, INR in the last 72 hours. ABG    Component Value Date/Time   HCO3 26.6 04/18/2017 1746   TCO2 28 04/18/2017 1746   O2SAT 92.0 04/18/2017 1746   CBG (last 3)  No results for input(s): GLUCAP in the last 72 hours.  Meds Scheduled Meds: . arformoterol  15 mcg Nebulization BID  . azithromycin  250 mg Oral Daily  . bisacodyl  10 mg Rectal Daily  . budesonide (PULMICORT) nebulizer solution  0.5 mg Nebulization BID  . buPROPion  300 mg Oral Daily  . chlorpheniramine-HYDROcodone  5 mL Oral Q12H  . cycloSPORINE  1 drop Both Eyes BID  . enoxaparin (LOVENOX) injection  40 mg Subcutaneous Q24H  . famotidine  20 mg Oral BID  . fluticasone  1 spray Each Nare TID  . lidocaine  1 patch  Transdermal Q24H  . loratadine  10 mg Oral Daily  . multivitamin with minerals  1 tablet Oral Daily  . oxyCODONE-acetaminophen  1 tablet Oral Q4H  . pantoprazole  40 mg Oral Daily  . polyethylene glycol  17 g Oral Daily  . senna-docusate  1 tablet Oral BID   Continuous Infusions: PRN Meds:.acetaminophen, benzonatate, levalbuterol, LORazepam, morphine injection, ondansetron **OR** ondansetron (ZOFRAN) IV  Xrays Dg Chest Port 1 View  Result Date: 05/06/2017 CLINICAL DATA:  Right chest tube positioning. EXAM: PORTABLE CHEST 1 VIEW COMPARISON:  Multiple prior chest x-rays, most recently from yesterday. FINDINGS: Right-sided chest tube in place with interval increase in size of the small right pneumothorax, now approximately 20%. The chest tube side port is now just inside the inner margin of the ribs. Unchanged low lung volumes with coarsened basilar predominant opacities. The enlarged cardiomediastinal silhouette is unchanged. IMPRESSION: 1. Interval increase in size of the small right pneumothorax, now approximately 20%. The chest tube side port projects just inside the inner margin of the ribs and could potentially be partially extrapleural. 2. Unchanged pulmonary fibrosis. These results will be called to the ordering clinician or representative by the Radiologist Assistant, and communication documented in the PACS or zVision Dashboard. Electronically Signed   By: Vickki HearingWilliam T Derry M.D.  On: 05/06/2017 08:23   Dg Chest Port 1 View  Result Date: 05/05/2017 CLINICAL DATA:  Right pneumothorax EXAM: PORTABLE CHEST 1 VIEW COMPARISON:  Two days ago FINDINGS: Right pneumothorax is decreased from prior, now less than 5%. Right-sided chest tube is somewhat shorter than before, but side-port still projecting in side the pleural cavity. Low volume chest with coarse opacities mainly at the bases. Apparent cardiomegaly, accentuated by technique. Enlarged hila, reference chest CT 04/19/2017. IMPRESSION: 1.  Decreased right apical pneumothorax, now less than 5%. The right-sided chest tube is slightly shorter than before. 2. Pulmonary fibrosis. Electronically Signed   By: Marnee Spring M.D.   On: 05/05/2017 07:48    Assessment/Plan: S/P Procedure(s) (LRB): CHEST TUBE INSERTION (Right) 1 improving pain control from talc, continue pain meds 2 air leak is 7/7, CXR shows approx 20 percent pneumothorax 3 medical management per primary   LOS: 18 days    GOLD,[name] 05/06/2017 Chest x-ray this a.m. with increased right pneumothorax. Chest tube appears to be appropriately positioned. We'll place Pleur-evac back to 20 cm suction Monitoring airleak for possible endobronchial valve placement by Dr. Dorris Fetch later in the week if indicated-I have discussed this with the patient.

## 2017-05-07 ENCOUNTER — Inpatient Hospital Stay (HOSPITAL_COMMUNITY): Payer: Medicare Other

## 2017-05-07 DIAGNOSIS — R0789 Other chest pain: Secondary | ICD-10-CM

## 2017-05-07 LAB — PROCALCITONIN: PROCALCITONIN: 0.36 ng/mL

## 2017-05-07 NOTE — Progress Notes (Addendum)
Daily Progress Note   Patient Name: [name]       Date: 05/07/2017 DOB: 01-23-1951  Age: 66 y.o. MRN#: [id number] Attending Physician: Kathlen ModyAkula, Vijaya, MD Primary Care Physician: Pearline Cablesopland, Jessica C, MD Admit Date: 04/18/2017  Reason for Consultation/Follow-up: Pain control and Psychosocial/spiritual support  Subjective: [name] tells me she is  feeling better today.  She reports her pain is controlled but increases with movement.  She describes it as sharp, "comes on as quick as it goes"  She is handling most of the intermittent quick pain with pillow splinting.      Length of Stay: 19  Current Medications: Scheduled Meds:  . arformoterol  15 mcg Nebulization BID  . azithromycin  250 mg Oral Daily  . bisacodyl  10 mg Rectal Daily  . budesonide (PULMICORT) nebulizer solution  0.5 mg Nebulization BID  . buPROPion  300 mg Oral Daily  . chlorpheniramine-HYDROcodone  5 mL Oral Q12H  . clotrimazole  10 mg Oral 5 X Daily  . cycloSPORINE  1 drop Both Eyes BID  . enoxaparin (LOVENOX) injection  40 mg Subcutaneous Q24H  . famotidine  20 mg Oral BID  . fluticasone  1 spray Each Nare TID  . lidocaine  1 patch Transdermal Q24H  . loratadine  10 mg Oral Daily  . multivitamin with minerals  1 tablet Oral Daily  . oxyCODONE  5 mg Oral Q4H while awake   And  . oxyCODONE  5 mg Oral QHS  . pantoprazole  40 mg Oral Daily  . polyethylene glycol  17 g Oral Daily  . senna-docusate  1 tablet Oral BID    Continuous Infusions:   PRN Meds: acetaminophen, levalbuterol, LORazepam, oxyCODONE **OR** morphine injection, ondansetron **OR** ondansetron (ZOFRAN) IV  Physical Exam  Constitutional: She appears well-developed.  HENT:  Oropharynx is clear and moist, noted white exudate on tongue  Cardiovascular: Normal rate, regular rhythm and  normal heart sounds.   Pulmonary/Chest: She has decreased breath sounds in the right lower field.  Neurological: She is alert.  Skin: Skin is warm and dry.      Vital Signs: BP 103/63 (BP Location: Left Wrist)   Pulse 96   Temp 98.5 F (36.9 C) (Oral)   Resp (!) 23   Ht 5\' 5"  (1.651 m)   Wt 97.3 kg (214 lb 9.6 oz)   SpO2 97%   BMI 35.71 kg/m  SpO2: SpO2: 97 % O2 Device: O2 Device: Nasal Cannula O2 Flow Rate: O2 Flow Rate (L/min): 3 L/min  Intake/output summary:   Intake/Output Summary (Last 24 hours) at 05/07/17 1147 Last data filed at 05/07/17 0619  Gross per 24 hour  Intake              440 ml  Output              748 ml  Net             -308 ml   LBM: Last BM Date: 05/06/17 Baseline Weight: Weight: 100.4 kg (221 lb 6.4 oz) Most recent weight: Weight: 97.3 kg (214 lb 9.6 oz)  Palliative Assessment/Data: PPS 70-80%    Patient Active Problem List  Diagnosis Date Noted  . Chest tube in place   . Goals of care, counseling/discussion   . Palliative care by specialist   . Pneumothorax on right 04/18/2017  . Spontaneous pneumothorax 04/18/2017  . SOB (shortness of breath) 06/05/2016  . Osteopenia 02/12/2016  . Lumbar radiculopathy 01/26/2015  . Chronic respiratory failure with hypoxia (HCC) 04/12/2014  . Obesity (BMI 30-39.9) 02/08/2014  . Allergic rhinitis 09/05/2010  . IBS 09/05/2010  . GERD 02/18/2010  . Barrett's esophagus 02/12/2010  . Upper airway cough syndrome 11/02/2009  . Anxiety state 08/29/2008  . History of colonic polyps 08/29/2008  . PULMONARY SARCOIDOSIS 08/12/2007  . Mild persistent asthma in adult without complication with component of vcd 08/12/2007  . Diaphragmatic hernia 08/12/2007  . Migraine 08/12/2007    Palliative Care Assessment & Plan   HPI: 66 y.o. female  with past medical history of pulmonary sarcoid (diagnosed in 1995, followed by Dr. Vassie Loll, just tapered off Prednisone July 15th), asthma, anxiety, and GERD. She presented to  the ED from home with acute onset right sided chest pain and shortness of breath. Imaging in ED noted large right pneumothorax with complete lung collapse.  She was admitted on 04/18/2017. Cardiothoracic surgery following, s/p chest tube placement and talc placement (to prevent further lung collapse).   Plan is for endobronchial valve placement early next week  Palliative consulted for goals of care and pain management  Assessment:  [name] has struggled with pain control since placement of chest tube then after talc placement.  She reports no chronic pain prior to this admission  Her goal is to use the smallest amount of oral pain medication as possible to remain comfortable.   Recommendations/Plan:   Full code, full scope aggressive treatment  Thrush noted on tongue, clotrimazole troche ordered. Pt encouraged to rinse mouth after inhaler use.  Encouraged to brush tongue as part of her mouth care.  Pain Management   Oxycodone and PRN Tylenol. The Oxycodone will now be every four hours while awake, and then an extra dose at bedside.   Change PRN morphine from 2-4mg  to 2mg , and now have the option of first trying PRN Oxycodone.   The goal is to transition her off of IV pain medication, but leave it available as second line back-up for now.  Will need continued assessment and adjustments. priority after  IBV placement; anticipated early next  week.  Discussed utilization of pillow splinting on movement to reduce    Goals of Care and Additional Recommendations:  Limitations on Scope of Treatment: Full Scope Treatment  Code Status:  Full code  Prognosis:   Unable to determine  Discharge Planning:  Home with Home Health  Discussed with nursing  Thank you for allowing the Palliative Medicine Team to assist in the care of this patient.  Time in 1300  Time out 1325    Total time: 25 minutes    Greater than 50%  of this time was spent counseling and coordinating care  related to the above assessment and plan.  Lorinda Creed NP  Palliative Medicine Team Team Phone # [phone] Pager [phone]

## 2017-05-07 NOTE — Progress Notes (Signed)
PROGRESS NOTE    [name]  [name] DOB: 1950-12-07 DOA: 04/18/2017 PCP: Pearline Cables, MD    Brief Narrative:[name] is a 66 y.o. female with medical history significant of pulmonary sarcoidosis, follows up with [name] presents with sudden onset of chest pain or sob. She was found to have large right pneumothorax with complete collapse. Cardiothoracic surgery consulted and she underwent chest tube placement on the rigth on 8/16, and s/p talk pleurodesis.  Over the last one week, she has air leek and repeat CXR this am shows increase in the pneumothorax to 20%.  Another issue is her pain at the site of the chest tube, which is better today. Plan is when the pneumothorax improves, to transition to mini express.   Assessment & Plan:   Principal Problem:   Pneumothorax on right Active Problems:   PULMONARY SARCOIDOSIS   Mild persistent asthma in adult without complication with component of vcd   Chronic respiratory failure with hypoxia (HCC)   Spontaneous pneumothorax   Chest tube in place   Goals of care, counseling/discussion   Palliative care by specialist  Right sided pneumothorax:  Air leak over the last one week, increase in the pneumothorax on 8/29, repeat CXR today shows decrease to less than 5 %.  Cardiothoracic surgery following and plan for IBV placement , bronchoscopy in am.  Pain control.  S/p talc pleurodesis,  Unable to tolerate water seal.  Pt is a poor candidate for surgical intervention.    Pulmonary sarcoidosis:  H/o chronic respiratory failure with hypoxia on 2l it of [place] oxygen at home.  Follows up with [name], no exacerbation this admission.     Constipation:  From pain meds.  Resume miralax, senna colace and dulcolax.     Febrile overnight:  No new pneumonia on cxr.  UA  Is pending.  Lactic acid is 0.7.  No leukocytosis.  Blood cultures ordered and pending.      Mild normocytic anemia:  Possibly from anemia of  chronic disease. Hemoglobin stable around 11.     DVT prophylaxis: (Lovenox) Code Status: (Full/) Family Communication: (none at bedside,offered to talk to the daughter if they have questions.  Disposition Plan: pending resolution of the pneumothorax.    Consultants:   Cardiothoracic surgery.   pulmonology   Procedures: chest tube placement.    Antimicrobials: azithromycin till august 31st.    Subjective: Pain better controlled with pain meds.  Sitting in chair and appears comfortable.   Objective: Vitals:   05/06/17 2030 05/06/17 2133 05/07/17 0618 05/07/17 1550  BP:  103/71 103/63 111/76  Pulse:  (!) 109 96 (!) 110  Resp:  19 (!) 23 (!) 25  Temp:  99.5 F (37.5 C) 98.5 F (36.9 C) 99.9 F (37.7 C)  TempSrc:  Oral Oral Oral  SpO2: 94% 92% 97% 93%  Weight:   97.3 kg (214 lb 9.6 oz)   Height:        Intake/Output Summary (Last 24 hours) at 05/07/17 1626 Last data filed at 05/07/17 1300  Gross per 24 hour  Intake              720 ml  Output              448 ml  Net              272 ml   Filed Weights   05/04/17 0559 05/05/17 0554 05/07/17 0618  Weight: 84.3 kg (185 lb 12.8  oz) 97.7 kg (215 lb 6.4 oz) 97.3 kg (214 lb 9.6 oz)    Examination:  General exam: Appears calm and comfortable on 3 lit of [place] oxygen. Sitting in the chair today Respiratory system: Clear to auscultation. Respiratory effort normal. No wheezing or rhonchi.  Cardiovascular system: S1 & S2 heard, RRR. No JVD, murmurs, rubs, gallops or clicks. No pedal edema. Gastrointestinal system: Abdomen is nondistended, soft and nontender. No organomegaly or masses felt. Normal bowel sounds heard. Central nervous system: Alert and oriented. No focal neurological deficits. Extremities: Symmetric 5 x 5 power. Skin: No rashes, lesions or ulcers Psychiatry: Judgement and insight appear normal. Mood & affect appropriate.     Data Reviewed: I have personally reviewed following labs and imaging  studies  CBC:  Recent Labs Lab 05/05/17 2330  WBC 10.0  NEUTROABS 6.2  HGB 11.3*  HCT 35.6*  MCV 92.7  PLT 286   Basic Metabolic Panel:  Recent Labs Lab 05/05/17 2330  NA 133*  K 4.3  CL 97*  CO2 28  GLUCOSE 100*  BUN 7  CREATININE 1.04*  CALCIUM 8.7*   GFR: Estimated Creatinine Clearance: 59.7 mL/min (A) (by C-G formula based on SCr of 1.04 mg/dL (H)). Liver Function Tests: No results for input(s): AST, ALT, ALKPHOS, BILITOT, PROT, ALBUMIN in the last 168 hours. No results for input(s): LIPASE, AMYLASE in the last 168 hours. No results for input(s): AMMONIA in the last 168 hours. Coagulation Profile: No results for input(s): INR, PROTIME in the last 168 hours. Cardiac Enzymes: No results for input(s): CKTOTAL, CKMB, CKMBINDEX, TROPONINI in the last 168 hours. BNP (last 3 results) No results for input(s): PROBNP in the last 8760 hours. HbA1C: No results for input(s): HGBA1C in the last 72 hours. CBG: No results for input(s): GLUCAP in the last 168 hours. Lipid Profile: No results for input(s): CHOL, HDL, LDLCALC, TRIG, CHOLHDL, LDLDIRECT in the last 72 hours. Thyroid Function Tests: No results for input(s): TSH, T4TOTAL, FREET4, T3FREE, THYROIDAB in the last 72 hours. Anemia Panel: No results for input(s): VITAMINB12, FOLATE, FERRITIN, TIBC, IRON, RETICCTPCT in the last 72 hours. Sepsis Labs:  Recent Labs Lab 05/05/17 2330 05/06/17 0206 05/07/17 0555  PROCALCITON 0.44  --  0.36  LATICACIDVEN 0.7 0.7  --     Recent Results (from the past 240 hour(s))  Culture, blood (Routine X 2) w Reflex to ID Panel     Status: None (Preliminary result)   Collection Time: 05/05/17 11:30 PM  Result Value Ref Range Status   Specimen Description BLOOD RIGHT ARM  Final   Special Requests   Final    BOTTLES DRAWN AEROBIC AND ANAEROBIC Blood Culture adequate volume   Culture NO GROWTH 1 DAY  Final   Report Status PENDING  Incomplete  Culture, blood (Routine X 2) w  Reflex to ID Panel     Status: None (Preliminary result)   Collection Time: 05/05/17 11:39 PM  Result Value Ref Range Status   Specimen Description BLOOD LEFT HAND  Final   Special Requests IN PEDIATRIC BOTTLE Blood Culture adequate volume  Final   Culture NO GROWTH 1 DAY  Final   Report Status PENDING  Incomplete         Radiology Studies: Dg Chest Port 1 View  Result Date: 05/07/2017 CLINICAL DATA:  Chest tube EXAM: PORTABLE CHEST 1 VIEW COMPARISON:  05/06/2017 FINDINGS: Right chest tube remains in place. Small residual right apical pneumothorax, less than 5%. Very low lung volumes with diffuse  bilateral airspace disease. Lung volumes have decreased further since prior study. No definite effusions. IMPRESSION: Significant decrease in the size of the right pneumothorax, now less than 5%. New graft very low lung volumes with diffuse bilateral airspace disease. Electronically Signed   By: Charlett Nose M.D.   On: 05/07/2017 07:33   Dg Chest Port 1 View  Result Date: 05/06/2017 CLINICAL DATA:  Right chest tube positioning. EXAM: PORTABLE CHEST 1 VIEW COMPARISON:  Multiple prior chest x-rays, most recently from yesterday. FINDINGS: Right-sided chest tube in place with interval increase in size of the small right pneumothorax, now approximately 20%. The chest tube side port is now just inside the inner margin of the ribs. Unchanged low lung volumes with coarsened basilar predominant opacities. The enlarged cardiomediastinal silhouette is unchanged. IMPRESSION: 1. Interval increase in size of the small right pneumothorax, now approximately 20%. The chest tube side port projects just inside the inner margin of the ribs and could potentially be partially extrapleural. 2. Unchanged pulmonary fibrosis. These results will be called to the ordering clinician or representative by the Radiologist Assistant, and communication documented in the PACS or zVision Dashboard. Electronically Signed   By: Obie Dredge M.D.   On: 05/06/2017 08:23        Scheduled Meds: . arformoterol  15 mcg Nebulization BID  . azithromycin  250 mg Oral Daily  . bisacodyl  10 mg Rectal Daily  . budesonide (PULMICORT) nebulizer solution  0.5 mg Nebulization BID  . buPROPion  300 mg Oral Daily  . chlorpheniramine-HYDROcodone  5 mL Oral Q12H  . clotrimazole  10 mg Oral 5 X Daily  . cycloSPORINE  1 drop Both Eyes BID  . enoxaparin (LOVENOX) injection  40 mg Subcutaneous Q24H  . famotidine  20 mg Oral BID  . fluticasone  1 spray Each Nare TID  . lidocaine  1 patch Transdermal Q24H  . loratadine  10 mg Oral Daily  . multivitamin with minerals  1 tablet Oral Daily  . oxyCODONE  5 mg Oral Q4H while awake   And  . oxyCODONE  5 mg Oral QHS  . pantoprazole  40 mg Oral Daily  . polyethylene glycol  17 g Oral Daily  . senna-docusate  1 tablet Oral BID   Continuous Infusions:   LOS: 19 days    Time spent: 35 minutes.     Kathlen Mody, MD [facility] Pager [phone]   If 7PM-7AM, please contact night-coverage [email or web address] Password [email or web address] 05/07/2017, 4:26 PM

## 2017-05-07 NOTE — Progress Notes (Addendum)
[address]       [place] [address]             [phone]      14 Days Post-Op Procedure(s) (LRB): CHEST TUBE INSERTION (Right) Subjective: Small pntx with tube now on 20 cm H2O suction  Objective: Vital signs in last 24 hours: Temp:  [98.5 F (36.9 C)-99.5 F (37.5 C)] 98.5 F (36.9 C) (08/30 0618) Pulse Rate:  [96-115] 96 (08/30 0618) Cardiac Rhythm: Sinus tachycardia (08/30 0703) Resp:  [19-25] 23 (08/30 0618) BP: (95-116)/(63-93) 103/63 (08/30 0618) SpO2:  [92 %-97 %] 97 % (08/30 0618) Weight:  [214 lb 9.6 oz (97.3 kg)] 214 lb 9.6 oz (97.3 kg) (08/30 0618)  Hemodynamic parameters for last 24 hours:    Intake/Output from previous day: 08/29 0701 - 08/30 0700 In: 560 [P.O.:560] Out: 998 [Urine:950; Chest Tube:48] Intake/Output this shift: No intake/output data recorded.  General appearance: alert, cooperative and no distress Heart: regular rate and rhythm Lungs: dim in lower fields  Lab Results:  Recent Labs  05/05/17 2330  WBC 10.0  HGB 11.3*  HCT 35.6*  PLT 286   BMET:  Recent Labs  05/05/17 2330  NA 133*  K 4.3  CL 97*  CO2 28  GLUCOSE 100*  BUN 7  CREATININE 1.04*  CALCIUM 8.7*    PT/INR: No results for input(s): LABPROT, INR in the last 72 hours. ABG    Component Value Date/Time   HCO3 26.6 04/18/2017 1746   TCO2 28 04/18/2017 1746   O2SAT 92.0 04/18/2017 1746   CBG (last 3)  No results for input(s): GLUCAP in the last 72 hours.  Meds Scheduled Meds: . arformoterol  15 mcg Nebulization BID  . azithromycin  250 mg Oral Daily  . bisacodyl  10 mg Rectal Daily  . budesonide (PULMICORT) nebulizer solution  0.5 mg Nebulization BID  . buPROPion  300 mg Oral Daily  . chlorpheniramine-HYDROcodone  5 mL Oral Q12H  . clotrimazole  10 mg Oral 5 X Daily  . cycloSPORINE  1 drop Both Eyes BID  . enoxaparin (LOVENOX) injection  40 mg Subcutaneous Q24H  . famotidine  20 mg Oral BID  . fluticasone  1 spray Each Nare  TID  . lidocaine  1 patch Transdermal Q24H  . loratadine  10 mg Oral Daily  . multivitamin with minerals  1 tablet Oral Daily  . oxyCODONE  5 mg Oral Q4H while awake   And  . oxyCODONE  5 mg Oral QHS  . pantoprazole  40 mg Oral Daily  . polyethylene glycol  17 g Oral Daily  . senna-docusate  1 tablet Oral BID   Continuous Infusions: PRN Meds:.acetaminophen, levalbuterol, LORazepam, oxyCODONE **OR** morphine injection, ondansetron **OR** ondansetron (ZOFRAN) IV  Xrays Dg Chest Port 1 View  Result Date: Day CLINICAL DATA:  Chest tube EXAM: PORTABLE CHEST 1 VIEW COMPARISON:  05/06/2017 FINDINGS: Right chest tube remains in place. Small residual right apical pneumothorax, less than 5%. Very low lung volumes with diffuse bilateral airspace disease. Lung volumes have decreased further since prior study. No definite effusions. IMPRESSION: Significant decrease in the size of the right pneumothorax, now less than 5%. New graft very low lung volumes with diffuse bilateral airspace disease. Electronically Signed   By: Charlett Nose M.D.   On: 05/07/2017 07:33   Dg Chest Port 1 View  Result Date: 05/06/2017 CLINICAL DATA:  Right chest tube positioning. EXAM: PORTABLE CHEST 1 VIEW  COMPARISON:  Multiple prior chest x-rays, most recently from yesterday. FINDINGS: Right-sided chest tube in place with interval increase in size of the small right pneumothorax, now approximately 20%. The chest tube side port is now just inside the inner margin of the ribs. Unchanged low lung volumes with coarsened basilar predominant opacities. The enlarged cardiomediastinal silhouette is unchanged. IMPRESSION: 1. Interval increase in size of the small right pneumothorax, now approximately 20%. The chest tube side port projects just inside the inner margin of the ribs and could potentially be partially extrapleural. 2. Unchanged pulmonary fibrosis. These results will be called to the ordering clinician or representative by  the Radiologist Assistant, and communication documented in the PACS or zVision Dashboard. Electronically Signed   By: Obie DredgeWilliam T Derry M.D.   On: 05/06/2017 08:23    Assessment/Plan: S/P Procedure(s) (LRB): CHEST TUBE INSERTION (Right)  1 stable, with large persistent air leak, small pntx- keep on 20 of suction, poss EBV placement if doesn't resolve soon    LOS: 19 days    GOLD,[name] Patient seen and examined, agree with above She has a persistent continuous air leak on suction, has not been able to tolerate water seal, did not resolve with talc pleurodesis and is a poor candidate for surgical intervention. I was asked by Dr. Donata ClayVan Trigt to see her re: possible IBV placement to help with air leak. I discussed the proposed procedure with [name]. They understand this device is being used off label. They understand it will be done under general anesthesia with its risks. They understand no guarantee of success can be given. She understands and accepts the risks and agrees to proceed.  For bronchoscopy with IBV placement in AM  [name] C. Dorris FetchHendrickson, MD [facility] [phone]

## 2017-05-08 ENCOUNTER — Inpatient Hospital Stay (HOSPITAL_COMMUNITY): Payer: Medicare Other | Admitting: Emergency Medicine

## 2017-05-08 ENCOUNTER — Encounter (HOSPITAL_COMMUNITY): Payer: Self-pay | Admitting: Certified Registered"

## 2017-05-08 ENCOUNTER — Encounter (HOSPITAL_COMMUNITY)
Admission: EM | Disposition: A | Discharge status: Home Health | Payer: Self-pay | Source: Home / Self Care | Attending: Internal Medicine

## 2017-05-08 DIAGNOSIS — J9383 Other pneumothorax: Secondary | ICD-10-CM

## 2017-05-08 HISTORY — PX: VIDEO BRONCHOSCOPY WITH INSERTION OF INTERBRONCHIAL VALVE (IBV): SHX6178

## 2017-05-08 SURGERY — BRONCHOSCOPY, FLEXIBLE, WITH INTRABRONCHIAL VALVE INSERTION
Anesthesia: General

## 2017-05-08 MED ORDER — ARTIFICIAL TEARS OPHTHALMIC OINT
TOPICAL_OINTMENT | OPHTHALMIC | Status: DC | PRN
  Administered 2017-05-08: 80 via OPHTHALMIC
  Administered 2017-05-08: 40 via OPHTHALMIC
  Administered 2017-05-08: 80 via OPHTHALMIC

## 2017-05-08 MED ORDER — PROPOFOL 10 MG/ML IV BOLUS
INTRAVENOUS | Status: DC | PRN
  Administered 2017-05-08: 100 mg via INTRAVENOUS

## 2017-05-08 MED ORDER — LACTATED RINGERS IV SOLN
INTRAVENOUS | Status: DC | PRN
  Administered 2017-05-08: 10:00:00 via INTRAVENOUS

## 2017-05-08 MED ORDER — ONDANSETRON HCL 4 MG/2ML IJ SOLN
INTRAMUSCULAR | Status: DC | PRN
  Administered 2017-05-08: 4 mg via INTRAVENOUS

## 2017-05-08 MED ORDER — LACTATED RINGERS IV SOLN: INTRAVENOUS | Status: DC

## 2017-05-08 MED ORDER — PROPOFOL 10 MG/ML IV BOLUS
INTRAVENOUS | Status: AC
  Filled 2017-05-08: qty 20

## 2017-05-08 MED ORDER — MEPERIDINE HCL 25 MG/ML IJ SOLN: 6.2500 mg | INTRAMUSCULAR | Status: DC | PRN

## 2017-05-08 MED ORDER — FENTANYL CITRATE (PF) 100 MCG/2ML IJ SOLN
INTRAMUSCULAR | Status: DC | PRN
  Administered 2017-05-08: 125 ug via INTRAVENOUS

## 2017-05-08 MED ORDER — SUGAMMADEX SODIUM 200 MG/2ML IV SOLN
INTRAVENOUS | Status: DC | PRN
  Administered 2017-05-08: 400 mg via INTRAVENOUS

## 2017-05-08 MED ORDER — ROCURONIUM BROMIDE 100 MG/10ML IV SOLN
INTRAVENOUS | Status: DC | PRN
  Administered 2017-05-08: 50 mg via INTRAVENOUS

## 2017-05-08 MED ORDER — HYDROMORPHONE HCL 1 MG/ML IJ SOLN: 0.2500 mg | INTRAMUSCULAR | Status: DC | PRN

## 2017-05-08 MED ORDER — EPINEPHRINE PF 1 MG/ML IJ SOLN
INTRAMUSCULAR | Status: AC
  Filled 2017-05-08: qty 1

## 2017-05-08 MED ORDER — MIDAZOLAM HCL 2 MG/2ML IJ SOLN
INTRAMUSCULAR | Status: AC
  Filled 2017-05-08: qty 2

## 2017-05-08 MED ORDER — LIDOCAINE HCL (CARDIAC) 20 MG/ML IV SOLN
INTRAVENOUS | Status: DC | PRN
  Administered 2017-05-08: 100 mg via INTRAVENOUS

## 2017-05-08 MED ORDER — ONDANSETRON HCL 4 MG/2ML IJ SOLN: 4.0000 mg | Freq: Once | INTRAMUSCULAR | Status: DC | PRN

## 2017-05-08 MED ORDER — FENTANYL CITRATE (PF) 250 MCG/5ML IJ SOLN
INTRAMUSCULAR | Status: AC
  Filled 2017-05-08: qty 5

## 2017-05-08 SURGICAL SUPPLY — 26 items
CANISTER SUCT 3000ML PPV (MISCELLANEOUS) ×2 IMPLANT
CATH EMB 5FR 80CM (CATHETERS) ×1 IMPLANT
CATH LOADER DEPLOYMENT HUD (CATHETERS) ×1 IMPLANT
CONT SPEC 4OZ CLIKSEAL STRL BL (MISCELLANEOUS) ×2 IMPLANT
COVER BACK TABLE 60X90IN (DRAPES) ×2 IMPLANT
FILTER STRAW FLUID ASPIR (MISCELLANEOUS) IMPLANT
FORCEPS BIOP RJ4 1.8 (CUTTING FORCEPS) IMPLANT
GAUZE SPONGE 4X4 12PLY STRL (GAUZE/BANDAGES/DRESSINGS) ×2 IMPLANT
GLOVE SURG SIGNA 7.5 PF LTX (GLOVE) ×2 IMPLANT
GOWN STRL REUS W/ TWL XL LVL3 (GOWN DISPOSABLE) ×1 IMPLANT
GOWN STRL REUS W/TWL XL LVL3 (GOWN DISPOSABLE) ×2
KIT AIRWAY SIZING HUD (KITS) ×1 IMPLANT
KIT CLEAN ENDO COMPLIANCE (KITS) ×2 IMPLANT
KIT ROOM TURNOVER OR (KITS) ×2 IMPLANT
MARKER SKIN DUAL TIP RULER LAB (MISCELLANEOUS) ×2 IMPLANT
NS IRRIG 1000ML POUR BTL (IV SOLUTION) ×2 IMPLANT
OIL SILICONE PENTAX (PARTS (SERVICE/REPAIRS)) IMPLANT
PAD ARMBOARD 7.5X6 YLW CONV (MISCELLANEOUS) ×4 IMPLANT
STOPCOCK MORSE 400PSI 3WAY (MISCELLANEOUS) ×2 IMPLANT
SYR 10ML LL (SYRINGE) ×2 IMPLANT
SYR 20ML ECCENTRIC (SYRINGE) ×2 IMPLANT
TOWEL NATURAL 4PK STERILE (DISPOSABLE) ×2 IMPLANT
TRAP SPECIMEN MUCOUS 40CC (MISCELLANEOUS) ×2 IMPLANT
TUBE CONNECTING 20X1/4 (TUBING) ×2 IMPLANT
UNDERPAD 30X30 (UNDERPADS AND DIAPERS) ×2 IMPLANT
VALVE IN CARTRIDGE 9MM HUD (Valve) ×2 IMPLANT

## 2017-05-08 NOTE — H&P (View-Only) (Signed)
[address]       [place] [address]             [phone]      14 Days Post-Op Procedure(s) (LRB): CHEST TUBE INSERTION (Right) Subjective: Small pntx with tube now on 20 cm H2O suction  Objective: Vital signs in last 24 hours: Temp:  [98.5 F (36.9 C)-99.5 F (37.5 C)] 98.5 F (36.9 C) (08/30 0618) Pulse Rate:  [96-115] 96 (08/30 0618) Cardiac Rhythm: Sinus tachycardia (08/30 0703) Resp:  [19-25] 23 (08/30 0618) BP: (95-116)/(63-93) 103/63 (08/30 0618) SpO2:  [92 %-97 %] 97 % (08/30 0618) Weight:  [214 lb 9.6 oz (97.3 kg)] 214 lb 9.6 oz (97.3 kg) (08/30 0618)  Hemodynamic parameters for last 24 hours:    Intake/Output from previous day: 08/29 0701 - 08/30 0700 In: 560 [P.O.:560] Out: 998 [Urine:950; Chest Tube:48] Intake/Output this shift: No intake/output data recorded.  General appearance: alert, cooperative and no distress Heart: regular rate and rhythm Lungs: dim in lower fields  Lab Results:  Recent Labs  05/05/17 2330  WBC 10.0  HGB 11.3*  HCT 35.6*  PLT 286   BMET:  Recent Labs  05/05/17 2330  NA 133*  K 4.3  CL 97*  CO2 28  GLUCOSE 100*  BUN 7  CREATININE 1.04*  CALCIUM 8.7*    PT/INR: No results for input(s): LABPROT, INR in the last 72 hours. ABG    Component Value Date/Time   HCO3 26.6 04/18/2017 1746   TCO2 28 04/18/2017 1746   O2SAT 92.0 04/18/2017 1746   CBG (last 3)  No results for input(s): GLUCAP in the last 72 hours.  Meds Scheduled Meds: . arformoterol  15 mcg Nebulization BID  . azithromycin  250 mg Oral Daily  . bisacodyl  10 mg Rectal Daily  . budesonide (PULMICORT) nebulizer solution  0.5 mg Nebulization BID  . buPROPion  300 mg Oral Daily  . chlorpheniramine-HYDROcodone  5 mL Oral Q12H  . clotrimazole  10 mg Oral 5 X Daily  . cycloSPORINE  1 drop Both Eyes BID  . enoxaparin (LOVENOX) injection  40 mg Subcutaneous Q24H  . famotidine  20 mg Oral BID  . fluticasone  1 spray Each Nare  TID  . lidocaine  1 patch Transdermal Q24H  . loratadine  10 mg Oral Daily  . multivitamin with minerals  1 tablet Oral Daily  . oxyCODONE  5 mg Oral Q4H while awake   And  . oxyCODONE  5 mg Oral QHS  . pantoprazole  40 mg Oral Daily  . polyethylene glycol  17 g Oral Daily  . senna-docusate  1 tablet Oral BID   Continuous Infusions: PRN Meds:.acetaminophen, levalbuterol, LORazepam, oxyCODONE **OR** morphine injection, ondansetron **OR** ondansetron (ZOFRAN) IV  Xrays Dg Chest Port 1 View  Result Date: 05/07/2017 CLINICAL DATA:  Chest tube EXAM: PORTABLE CHEST 1 VIEW COMPARISON:  05/06/2017 FINDINGS: Right chest tube remains in place. Small residual right apical pneumothorax, less than 5%. Very low lung volumes with diffuse bilateral airspace disease. Lung volumes have decreased further since prior study. No definite effusions. IMPRESSION: Significant decrease in the size of the right pneumothorax, now less than 5%. New graft very low lung volumes with diffuse bilateral airspace disease. Electronically Signed   By: Charlett Nose M.D.   On: 05/07/2017 07:33   Dg Chest Port 1 View  Result Date: 05/06/2017 CLINICAL DATA:  Right chest tube positioning. EXAM: PORTABLE CHEST 1 VIEW  COMPARISON:  Multiple prior chest x-rays, most recently from yesterday. FINDINGS: Right-sided chest tube in place with interval increase in size of the small right pneumothorax, now approximately 20%. The chest tube side port is now just inside the inner margin of the ribs. Unchanged low lung volumes with coarsened basilar predominant opacities. The enlarged cardiomediastinal silhouette is unchanged. IMPRESSION: 1. Interval increase in size of the small right pneumothorax, now approximately 20%. The chest tube side port projects just inside the inner margin of the ribs and could potentially be partially extrapleural. 2. Unchanged pulmonary fibrosis. These results will be called to the ordering clinician or representative by  the Radiologist Assistant, and communication documented in the PACS or zVision Dashboard. Electronically Signed   By: Obie DredgeWilliam T Derry M.D.   On: 05/06/2017 08:23    Assessment/Plan: S/P Procedure(s) (LRB): CHEST TUBE INSERTION (Right)  1 stable, with large persistent air leak, small pntx- keep on 20 of suction, poss EBV placement if doesn't resolve soon    LOS: 19 days    [name] 05/07/2017 Patient seen and examined, agree with above She has a persistent continuous air leak on suction, has not been able to tolerate water seal, did not resolve with talc pleurodesis and is a poor candidate for surgical intervention. I was asked by Dr. Donata ClayVan Trigt to see Kiara re: possible IBV placement to help with air leak. I discussed the proposed procedure with [name] and [name]. They understand this device is being used off label. They understand it will be done under general anesthesia with its risks. They understand no guarantee of success can be given. She understands and accepts the risks and agrees to proceed.  For bronchoscopy with IBV placement in AM  Kiara C. Dorris FetchHendrickson, MD [facility] [phone]

## 2017-05-08 NOTE — Anesthesia Postprocedure Evaluation (Signed)
Anesthesia Post Note  Patient: [name]  Procedure(s) Performed: Procedure(s) (LRB): VIDEO BRONCHOSCOPY WITH INSERTION OF INTERBRONCHIAL VALVE (IBV) (N/A)     Patient location during evaluation: PACU Anesthesia Type: General Level of consciousness: awake and alert Pain management: pain level controlled Vital Signs Assessment: post-procedure vital signs reviewed and stable Respiratory status: spontaneous breathing, nonlabored ventilation, respiratory function stable and patient connected to nasal cannula oxygen Cardiovascular status: blood pressure returned to baseline and stable Postop Assessment: no signs of nausea or vomiting Anesthetic complications: no    Last Vitals:  Vitals:   05/08/17 1135 05/08/17 1311  BP: 95/68   Pulse: (!) 110   Resp: (!) 24   Temp:  37.7 C  SpO2: 93% 95%    Last Pain:  Vitals:   05/08/17 1311  TempSrc:   PainSc: 0-No pain                 Bearl Talarico DAVID

## 2017-05-08 NOTE — Transfer of Care (Signed)
Immediate Anesthesia Transfer of Care Note  Patient: Margaretha SeedsBarbara V Mainwaring  Procedure(s) Performed: Procedure(s): VIDEO BRONCHOSCOPY WITH INSERTION OF INTERBRONCHIAL VALVE (IBV) (N/A)  Patient Location: PACU  Anesthesia Type:General  Level of Consciousness: awake, alert  and oriented  Airway & Oxygen Therapy: Patient Spontanous Breathing and Patient connected to face mask  Post-op Assessment: Report given to RN, Post -op Vital signs reviewed and stable and Patient moving all extremities X 4  Post vital signs: Reviewed and stable  Last Vitals:  Vitals:   05/08/17 0636 05/08/17 0752  BP: (!) 92/53   Pulse: 100   Resp: 20   Temp: (!) 38 C   SpO2: 97% 100%    Last Pain:  Vitals:   05/08/17 1000  TempSrc:   PainSc: 0-No pain      Patients Stated Pain Goal: 0 (05/08/17 1000)  Complications: No apparent anesthesia complications

## 2017-05-08 NOTE — Brief Op Note (Signed)
04/18/2017 - 05/08/2017  10:22 AM  PATIENT:  Kiara BuzzardBarbara V Day  66 y.o. female  PRE-OPERATIVE DIAGNOSIS:  AIRLEAK  POST-OPERATIVE DIAGNOSIS:  AIRLEAK  PROCEDURE:  Procedure(s): VIDEO BRONCHOSCOPY WITH INSERTION OF INTERBRONCHIAL VALVE (IBV) (N/A)-  Two 9mm valves placed in Right upper lobe segmental bronchi  SURGEON:  Surgeon(s) and Role:    Loreli Slot* Ramya Vanbergen C, MD - Primary  ANESTHESIA:   general  EBL:  Total I/O In: -  Out: 11 [Stool:1; Blood:10]  BLOOD ADMINISTERED:none  DRAINS: none   LOCAL MEDICATIONS USED:  NONE  SPECIMEN:  No Specimen  DISPOSITION OF SPECIMEN:  N/A  PLAN OF CARE: return to inpatient status  PATIENT DISPOSITION:  ICU - extubated and stable.   Delay start of Pharmacological VTE agent (>24hrs) due to surgical blood loss or risk of bleeding: no

## 2017-05-08 NOTE — Interval H&P Note (Signed)
History and Physical Interval Note: No change in air leak 05/08/2017 9:24 AM  [name]  has presented today for surgery, with the diagnosis of AIRLEAK  The various methods of treatment have been discussed with the patient and family. After consideration of risks, benefits and other options for treatment, the patient has consented to  Procedure(s): VIDEO BRONCHOSCOPY WITH INSERTION OF INTERBRONCHIAL VALVE (IBV) (N/A) as a surgical intervention .  The patient's history has been reviewed, patient examined, no change in status, stable for surgery.  I have reviewed the patient's chart and labs.  Questions were answered to the patient's satisfaction.     Loreli SlotSteven C Preeti Winegardner

## 2017-05-08 NOTE — Progress Notes (Signed)
PROGRESS NOTE    [name]  WUJ:[id number] DOB: [birth year] DOA: 04/18/2017 PCP: Pearline Cables, MD    Brief Narrative:[name] is a 66 y.o. female with medical history significant of pulmonary sarcoidosis, follows up with [name] presents with sudden onset of chest pain or sob. She was found to have large right pneumothorax with complete collapse. Cardiothoracic surgery consulted and she underwent chest tube placement on the rigth on 8/16, and s/p talk pleurodesis.  Over the last one week, she has air leek and repeat CXR this am shows increase in the pneumothorax to 20%.  Another issue is her pain at the site of the chest tube, which is better today. Plan is when the pneumothorax improves, to transition to mini express.   Assessment & Plan:   Principal Problem:   Pneumothorax on right Active Problems:   PULMONARY SARCOIDOSIS   Mild persistent asthma in adult without complication with component of vcd   Chronic respiratory failure with hypoxia (HCC)   Spontaneous pneumothorax   Other chest pain   Goals of care, counseling/discussion   Palliative care by specialist  Right sided pneumothorax:  Air leak over the last one week, increase in the pneumothorax on 8/29, repeat CXR today shows decrease to less than 5 %.  Cardiothoracic surgery following, she underwent IBV placement via bronchoscopy in am.  Pain control.  S/p talc pleurodesis,  Unable to tolerate water seal.  Pt is a poor candidate for surgical intervention.    Pulmonary sarcoidosis:  H/o chronic respiratory failure with hypoxia on 2l it of [place] oxygen at home.  Follows up with [name], no exacerbation this admission.     Constipation:  From pain meds.  Resume miralax, senna colace and dulcolax.  Improved.     Fever on the night of 8/29:  No new pneumonia on cxr.  UA  Is pending.  Lactic acid is 0.7.  No leukocytosis.  Blood cultures ordered and pending, neg so far. .      Mild normocytic  anemia:  Possibly from anemia of chronic disease. Hemoglobin stable around 11.     DVT prophylaxis: (Lovenox) Code Status: (Full/) Family Communication: talked to her daughter and husband at bedside.   Disposition Plan: pending resolution of the pneumothorax.    Consultants:   Cardiothoracic surgery.   pulmonology   Procedures: chest tube placement.    Antimicrobials: azithromycin till august 31st.    Subjective: In bed after the procedure.  Pain is improved.   Objective: Vitals:   05/08/17 1105 05/08/17 1120 05/08/17 1135 05/08/17 1311  BP: 97/62 104/69 95/68   Pulse: (!) 113 (!) 109 (!) 110   Resp: (!) 24 (!) 26 (!) 24   Temp:  97.9 F (36.6 C)  99.8 F (37.7 C)  TempSrc:      SpO2: 90% 97% 93% 95%  Weight:      Height:        Intake/Output Summary (Last 24 hours) at 05/08/17 1440 Last data filed at 05/08/17 1328  Gross per 24 hour  Intake              900 ml  Output              911 ml  Net              -11 ml   Filed Weights   05/05/17 0554 05/07/17 0618 05/08/17 0636  Weight: 97.7 kg (215 lb 6.4 oz) 97.3 kg (214 lb  9.6 oz) 96.6 kg (213 lb)    Examination:  General exam: Appears calm and comfortable on 4 lit of [place] oxygen. Respiratory system: clear and no wheezing or rhonchi.  Cardiovascular system: S1 & S2 heard, RRR. No JVD, murmurs, rubs, gallops or clicks. No pedal edema. Gastrointestinal system: abd is soft NT nd bs+  Central nervous system: Alert and oriented. No focal neurological deficits. Extremities: Symmetric 5 x 5 power. Skin: No rashes, lesions or ulcers Psychiatry: Judgement and insight appear normal. Mood & affect appropriate.     Data Reviewed: I have personally reviewed following labs and imaging studies  CBC:  Recent Labs Lab 05/05/17 2330  WBC 10.0  NEUTROABS 6.2  HGB 11.3*  HCT 35.6*  MCV 92.7  PLT 286   Basic Metabolic Panel:  Recent Labs Lab 05/05/17 2330  NA 133*  K 4.3  CL 97*  CO2 28  GLUCOSE 100*    BUN 7  CREATININE 1.04*  CALCIUM 8.7*   GFR: Estimated Creatinine Clearance: 59.5 mL/min (A) (by C-G formula based on SCr of 1.04 mg/dL (H)). Liver Function Tests: No results for input(s): AST, ALT, ALKPHOS, BILITOT, PROT, ALBUMIN in the last 168 hours. No results for input(s): LIPASE, AMYLASE in the last 168 hours. No results for input(s): AMMONIA in the last 168 hours. Coagulation Profile: No results for input(s): INR, PROTIME in the last 168 hours. Cardiac Enzymes: No results for input(s): CKTOTAL, CKMB, CKMBINDEX, TROPONINI in the last 168 hours. BNP (last 3 results) No results for input(s): PROBNP in the last 8760 hours. HbA1C: No results for input(s): HGBA1C in the last 72 hours. CBG: No results for input(s): GLUCAP in the last 168 hours. Lipid Profile: No results for input(s): CHOL, HDL, LDLCALC, TRIG, CHOLHDL, LDLDIRECT in the last 72 hours. Thyroid Function Tests: No results for input(s): TSH, T4TOTAL, FREET4, T3FREE, THYROIDAB in the last 72 hours. Anemia Panel: No results for input(s): VITAMINB12, FOLATE, FERRITIN, TIBC, IRON, RETICCTPCT in the last 72 hours. Sepsis Labs:  Recent Labs Lab 05/05/17 2330 05/06/17 0206 05/07/17 0555  PROCALCITON 0.44  --  0.36  LATICACIDVEN 0.7 0.7  --     Recent Results (from the past 240 hour(s))  Culture, blood (Routine X 2) w Reflex to ID Panel     Status: None (Preliminary result)   Collection Time: 05/05/17 11:30 PM  Result Value Ref Range Status   Specimen Description BLOOD RIGHT ARM  Final   Special Requests   Final    BOTTLES DRAWN AEROBIC AND ANAEROBIC Blood Culture adequate volume   Culture NO GROWTH 1 DAY  Final   Report Status PENDING  Incomplete  Culture, blood (Routine X 2) w Reflex to ID Panel     Status: None (Preliminary result)   Collection Time: 05/05/17 11:39 PM  Result Value Ref Range Status   Specimen Description BLOOD LEFT HAND  Final   Special Requests IN PEDIATRIC BOTTLE Blood Culture adequate  volume  Final   Culture NO GROWTH 1 DAY  Final   Report Status PENDING  Incomplete         Radiology Studies: Dg Chest Port 1 View  Result Date: 05/07/2017 CLINICAL DATA:  Chest tube EXAM: PORTABLE CHEST 1 VIEW COMPARISON:  05/06/2017 FINDINGS: Right chest tube remains in place. Small residual right apical pneumothorax, less than 5%. Very low lung volumes with diffuse bilateral airspace disease. Lung volumes have decreased further since prior study. No definite effusions. IMPRESSION: Significant decrease in the size of the right  pneumothorax, now less than 5%. New graft very low lung volumes with diffuse bilateral airspace disease. Electronically Signed   By: Charlett Nose M.D.   On: 05/07/2017 07:33        Scheduled Meds: . arformoterol  15 mcg Nebulization BID  . bisacodyl  10 mg Rectal Daily  . budesonide (PULMICORT) nebulizer solution  0.5 mg Nebulization BID  . buPROPion  300 mg Oral Daily  . chlorpheniramine-HYDROcodone  5 mL Oral Q12H  . clotrimazole  10 mg Oral 5 X Daily  . cycloSPORINE  1 drop Both Eyes BID  . enoxaparin (LOVENOX) injection  40 mg Subcutaneous Q24H  . famotidine  20 mg Oral BID  . fluticasone  1 spray Each Nare TID  . lidocaine  1 patch Transdermal Q24H  . loratadine  10 mg Oral Daily  . multivitamin with minerals  1 tablet Oral Daily  . oxyCODONE  5 mg Oral Q4H while awake   And  . oxyCODONE  5 mg Oral QHS  . pantoprazole  40 mg Oral Daily  . polyethylene glycol  17 g Oral Daily  . senna-docusate  1 tablet Oral BID   Continuous Infusions: . lactated ringers Stopped (05/08/17 0715)     LOS: 20 days    Time spent: 35 minutes.     Kathlen Mody, MD [facility] Pager [phone]   If 7PM-7AM, please contact night-coverage [email or web address] Password [email or web address] 05/08/2017, 2:40 PM

## 2017-05-08 NOTE — Progress Notes (Signed)
Pt had a 19 beat run of vtach. Pt was asymptomatic. Vitals BP 131/72 HR 118 Temp 102.6. Tylenol given and NP notified.

## 2017-05-08 NOTE — Anesthesia Procedure Notes (Signed)
Procedure Name: Intubation Date/Time: 05/08/2017 9:55 AM Performed by: Lanell MatarBAKER, Jeniya Flannigan M Pre-anesthesia Checklist: Patient identified, Emergency Drugs available, Suction available and Patient being monitored Patient Re-evaluated:Patient Re-evaluated prior to induction Oxygen Delivery Method: Circle System Utilized Preoxygenation: Pre-oxygenation with 100% oxygen Induction Type: IV induction Ventilation: Mask ventilation without difficulty Laryngoscope Size: Miller and 2 Grade View: Grade I Tube type: Oral Tube size: 8.5 mm Number of attempts: 1 Airway Equipment and Method: Stylet and Oral airway Placement Confirmation: ETT inserted through vocal cords under direct vision,  positive ETCO2 and breath sounds checked- equal and bilateral Secured at: 22 cm Tube secured with: Tape Dental Injury: Teeth and Oropharynx as per pre-operative assessment

## 2017-05-08 NOTE — Anesthesia Preprocedure Evaluation (Signed)
Anesthesia Evaluation  Patient identified by MRN, date of birth, ID band Patient awake    Reviewed: Allergy & Precautions, NPO status , Patient's Chart, lab work & pertinent test results  Airway Mallampati: I  TM Distance: >3 FB Neck ROM: Full    Dental   Pulmonary former smoker,    Pulmonary exam normal        Cardiovascular Normal cardiovascular exam     Neuro/Psych Anxiety    GI/Hepatic GERD  Medicated and Controlled,  Endo/Other    Renal/GU      Musculoskeletal   Abdominal   Peds  Hematology   Anesthesia Other Findings   Reproductive/Obstetrics                             Anesthesia Physical Anesthesia Plan  ASA: III  Anesthesia Plan: General   Post-op Pain Management:    Induction: Intravenous  PONV Risk Score and Plan: 2 and Ondansetron and Dexamethasone  Airway Management Planned: Oral ETT  Additional Equipment:   Intra-op Plan:   Post-operative Plan: Extubation in OR  Informed Consent: I have reviewed the patients History and Physical, chart, labs and discussed the procedure including the risks, benefits and alternatives for the proposed anesthesia with the patient or authorized representative who has indicated his/her understanding and acceptance.     Plan Discussed with: CRNA and Surgeon  Anesthesia Plan Comments:         Anesthesia Quick Evaluation

## 2017-05-08 NOTE — Op Note (Signed)
NAMLenore Cordia:  Schreffler, Aundrea              ACCOUNT NO.:  [id number]  MEDICAL RECORD NO.:  [id number]  LOCATION:                                 FACILITY:  PHYSICIAN:  Salvatore DecentSteven C. Dorris FetchHendrickson, M.D. DATE OF BIRTH:  DATE OF PROCEDURE:  05/08/2017 DATE OF DISCHARGE:                              OPERATIVE REPORT   PREOPERATIVE DIAGNOSIS:  Spontaneous pneumothorax with ongoing air leak.  POSTOPERATIVE DIAGNOSIS:  Spontaneous pneumothorax with ongoing air leak.  PROCEDURE:  Bronchoscopy and intrabronchial valve placement.  SURGEON:  Salvatore DecentSteven C. Dorris FetchHendrickson, M.D.  ASSISTANT:  None.  ANESTHESIA:  General.  FINDINGS:  Air leak appeared to be originating from upper lobe.  Both the segmental bronchi occluded with valves with good positioning.  CLINICAL NOTE:  Ms. Stevan BornHylton is a 66 year old woman with severe sarcoidosis with chronic respiratory failure.  She presented with a spontaneous pneumothorax.  Despite chest tube placement, she had an ongoing air leak.  She did not tolerate a trial of water seal and talc pleurodesis was unsuccessful in achieving pleural symphysis.  With an ongoing large air leak, she was felt to be a good candidate for intrabronchial valve placement.  The indications, risks, benefits, and alternatives were discussed in detail with the patient.  She understood this was an off-label use of the device.  She accepted the risks and agreed to proceed.  DESCRIPTION OF PROCEDURE:  Ms. Stevan BornHylton was brought to the operating room on May 08, 2017.  She had induction of general anesthesia and was intubated.  There was a large air leak from the right chest tube. Flexible fiberoptic bronchoscopy was performed.  It revealed normal endobronchial anatomy and no endobronchial lesions to the level of subsegmental bronchi.  There were thick white secretions.  These were evacuated with saline.  A Fogarty catheter was advanced through the bronchoscope and deployed in the right mainstem bronchus.   There was immediate resolution of the air leak.  Deploying the balloon in the bronchus intermedius resulted in no discernible change in the air leak and inflating the balloon in the orifice of the right upper lobe did result in resolution of the air leak.  There were 2 segmental bronchi rather than the typical 3.  Inflating the balloon into each of those did not significantly alter the air leak and the decision was made to occlude both the upper lobe segmental bronchi.  A calibrated catheter then was advanced.  The balloon was inflated in both airways sized for a 9 mm intrabronchial valve.  The valves were deployed with good seating of the valves at the origin of the segmental bronchi.  The air leak stopped after deployment of the valves.  The patient then was extubated in the operating room and taken to the Postanesthetic Care Unit in good condition.     Salvatore DecentSteven C. Dorris FetchHendrickson, M.D.     SCH/MEDQ  D:  05/08/2017  T:  05/08/2017  Job:  [id number]

## 2017-05-09 ENCOUNTER — Inpatient Hospital Stay (HOSPITAL_COMMUNITY): Payer: Medicare Other

## 2017-05-09 LAB — PROCALCITONIN: Procalcitonin: 0.45 ng/mL

## 2017-05-09 NOTE — Progress Notes (Signed)
PROGRESS NOTE    [name]  [name] DOB: [birth year] DOA: 04/18/2017 PCP: Pearline Cables, MD    Brief Narrative:[name] is a 66 y.o. female with medical history significant of pulmonary sarcoidosis, follows up with [name] presents with sudden onset of chest pain or sob. She was found to have large right pneumothorax with complete collapse. Cardiothoracic surgery consulted and she underwent chest tube placement on the rigth on 8/16, and s/p talk pleurodesis.  Over the last one week, she has air leek and repeat CXR this am shows increase in the pneumothorax to 20%.  Another issue is her pain at the site of the chest tube, which is better today.  Assessment & Plan:   Principal Problem:   Pneumothorax on right Active Problems:   PULMONARY SARCOIDOSIS   Mild persistent asthma in adult without complication with component of vcd   Chronic respiratory failure with hypoxia (HCC)   Spontaneous pneumothorax   Other chest pain   Goals of care, counseling/discussion   Palliative care by specialist  Right sided pneumothorax:  Air leak over the last one week, increase in the pneumothorax on 8/29, repeat CXR today shows decrease to less than 5 %.  Cardiothoracic surgery following, she underwent IBV placement via bronchoscopy. No more leak, repeat CXR does not show any pneumothorax. Plan for water seal of the chest tube today.  Pain control.     Pulmonary sarcoidosis:  H/o chronic respiratory failure with hypoxia on 2l it of [place] oxygen at home.  Follows up with [name], no exacerbation this admission.     Constipation:  From pain meds.  Resume miralax, senna colace and dulcolax.  Improved.      Febrile overnight No new pneumonia on cxr. Wbc normal.  Lactic acid is 0.7.  No leukocytosis.  Blood cultures ordered and pending, neg so far. Pro calcitonin is 0.45     Mild normocytic anemia:  Possibly from anemia of chronic disease. Hemoglobin stable around 11.      DVT prophylaxis: (Lovenox) Code Status: (Full/) Family Communication: none at bedside.  Disposition Plan: possible d/c in 1 to 2 days.    Consultants:   Cardiothoracic surgery.   pulmonology   Procedures: chest tube placement.    Antimicrobials: azithromycin till august 31st.    Subjective: Reports being comfortable.  No chest pain or sob.   Objective: Vitals:   05/09/17 0300 05/09/17 0727 05/09/17 0728 05/09/17 1417  BP: (!) 92/53   96/61  Pulse: (!) 105   98  Resp: (!) 23   (!) 21  Temp: 99.3 F (37.4 C)   99.1 F (37.3 C)  TempSrc: Oral   Oral  SpO2: 92% 95% 95% 96%  Weight: 94.1 kg (207 lb 8 oz)     Height:        Intake/Output Summary (Last 24 hours) at 05/09/17 1755 Last data filed at 05/09/17 1330  Gross per 24 hour  Intake              240 ml  Output              400 ml  Net             -160 ml   Filed Weights   05/07/17 0618 05/08/17 0636 05/09/17 0300  Weight: 97.3 kg (214 lb 9.6 oz) 96.6 kg (213 lb) 94.1 kg (207 lb 8 oz)    Examination:  General exam: Appears calm and comfortable on 3 lit  of [place] oxygen. Respiratory system: clear to auscultation, no wheezing or rhonchi.  Cardiovascular system: S1 & S2 heard, RRR. No JVD, No pedal edema. Gastrointestinal system: abd is soft NT ND BS+ Central nervous system: Alert and oriented. No focal neurological deficits. Extremities: Symmetric 5 x 5 power. Skin: No rashes, lesions or ulcers Psychiatry: Judgement and insight appear normal. Mood & affect appropriate.     Data Reviewed: I have personally reviewed following labs and imaging studies  CBC:  Recent Labs Lab 05/05/17 2330  WBC 10.0  NEUTROABS 6.2  HGB 11.3*  HCT 35.6*  MCV 92.7  PLT 286   Basic Metabolic Panel:  Recent Labs Lab 05/05/17 2330  NA 133*  K 4.3  CL 97*  CO2 28  GLUCOSE 100*  BUN 7  CREATININE 1.04*  CALCIUM 8.7*   GFR: Estimated Creatinine Clearance: 58.7 mL/min (A) (by C-G formula based on SCr of  1.04 mg/dL (H)). Liver Function Tests: No results for input(s): AST, ALT, ALKPHOS, BILITOT, PROT, ALBUMIN in the last 168 hours. No results for input(s): LIPASE, AMYLASE in the last 168 hours. No results for input(s): AMMONIA in the last 168 hours. Coagulation Profile: No results for input(s): INR, PROTIME in the last 168 hours. Cardiac Enzymes: No results for input(s): CKTOTAL, CKMB, CKMBINDEX, TROPONINI in the last 168 hours. BNP (last 3 results) No results for input(s): PROBNP in the last 8760 hours. HbA1C: No results for input(s): HGBA1C in the last 72 hours. CBG: No results for input(s): GLUCAP in the last 168 hours. Lipid Profile: No results for input(s): CHOL, HDL, LDLCALC, TRIG, CHOLHDL, LDLDIRECT in the last 72 hours. Thyroid Function Tests: No results for input(s): TSH, T4TOTAL, FREET4, T3FREE, THYROIDAB in the last 72 hours. Anemia Panel: No results for input(s): VITAMINB12, FOLATE, FERRITIN, TIBC, IRON, RETICCTPCT in the last 72 hours. Sepsis Labs:  Recent Labs Lab 05/05/17 2330 05/06/17 0206 05/07/17 0555 05/09/17 0548  PROCALCITON 0.44  --  0.36 0.45  LATICACIDVEN 0.7 0.7  --   --     Recent Results (from the past 240 hour(s))  Culture, blood (Routine X 2) w Reflex to ID Panel     Status: None (Preliminary result)   Collection Time: 05/05/17 11:30 PM  Result Value Ref Range Status   Specimen Description BLOOD RIGHT ARM  Final   Special Requests   Final    BOTTLES DRAWN AEROBIC AND ANAEROBIC Blood Culture adequate volume   Culture NO GROWTH 3 DAYS  Final   Report Status PENDING  Incomplete  Culture, blood (Routine X 2) w Reflex to ID Panel     Status: None (Preliminary result)   Collection Time: 05/05/17 11:39 PM  Result Value Ref Range Status   Specimen Description BLOOD LEFT HAND  Final   Special Requests IN PEDIATRIC BOTTLE Blood Culture adequate volume  Final   Culture NO GROWTH 3 DAYS  Final   Report Status PENDING  Incomplete         Radiology  Studies: Dg Chest Port 1 View  Result Date: 05/09/2017 CLINICAL DATA:  Pneumothorax, right chest tube EXAM: PORTABLE CHEST 1 VIEW COMPARISON:  05/07/2017 FINDINGS: Stable right chest tube position extending to the apex. Suspect similar small residual right apical pneumothorax without significant change. Extensive reticulonodular interstitial opacities and parenchymal scarring bilaterally compatible with history of sarcoid. Slightly improved lung volumes. Bronchial valves noted in the right hilum. No large effusion. Trachea is midline. Stable heart size and vascularity. IMPRESSION: Suspect stable right apical small pneumothorax.  Stable chest tube position. Slight improvement in lung volumes with similar chronic lung disease. Electronically Signed   By: Judie PetitM.  Shick M.D.   On: 05/09/2017 09:36        Scheduled Meds: . arformoterol  15 mcg Nebulization BID  . bisacodyl  10 mg Rectal Daily  . budesonide (PULMICORT) nebulizer solution  0.5 mg Nebulization BID  . buPROPion  300 mg Oral Daily  . chlorpheniramine-HYDROcodone  5 mL Oral Q12H  . clotrimazole  10 mg Oral 5 X Daily  . cycloSPORINE  1 drop Both Eyes BID  . enoxaparin (LOVENOX) injection  40 mg Subcutaneous Q24H  . famotidine  20 mg Oral BID  . fluticasone  1 spray Each Nare TID  . lidocaine  1 patch Transdermal Q24H  . loratadine  10 mg Oral Daily  . multivitamin with minerals  1 tablet Oral Daily  . oxyCODONE  5 mg Oral Q4H while awake   And  . oxyCODONE  5 mg Oral QHS  . pantoprazole  40 mg Oral Daily  . polyethylene glycol  17 g Oral Daily  . senna-docusate  1 tablet Oral BID   Continuous Infusions: . lactated ringers Stopped (05/08/17 0715)     LOS: 21 days    Time spent: 35 minutes.     Kathlen ModyAKULA,Samyuktha Brau, MD [facility] Pager [phone]   If 7PM-7AM, please contact night-coverage [email or web address] Password [email or web address] 05/09/2017, 5:55 PM

## 2017-05-09 NOTE — Progress Notes (Addendum)
      [address]       [place] [id number]             [phone]       1 Day Post-Op Procedure(s) (LRB): VIDEO BRONCHOSCOPY WITH INSERTION OF INTERBRONCHIAL VALVE (IBV) (N/A)  Subjective: Patient with pain at right chest tube site.  Objective: Vital signs in last 24 hours: Temp:  [97.9 F (36.6 C)-102.6 F (39.2 C)] 99.3 F (37.4 C) (09/01 0300) Pulse Rate:  [105-118] 105 (09/01 0300) Cardiac Rhythm: Sinus tachycardia (09/01 0801) Resp:  [23-36] 23 (09/01 0300) BP: (92-131)/(53-72) 92/53 (09/01 0300) SpO2:  [90 %-97 %] 95 % (09/01 0728) Weight:  [207 lb 8 oz (94.1 kg)] 207 lb 8 oz (94.1 kg) (09/01 0300)     Intake/Output from previous day: 08/31 0701 - 09/01 0700 In: 600 [I.V.:600] Out: 811 [Urine:400; Stool:1; Blood:10; Chest Tube:400]   Physical Exam:  Cardiovascular: RRR Pulmonary: Diminished at bases Wounds: Dressing is clean and dry.   Chest Tube: to suction, no air leak  Lab Results: CBC:No results for input(s): WBC, HGB, HCT, PLT in the last 72 hours. BMET: No results for input(s): NA, K, CL, CO2, GLUCOSE, BUN, CREATININE, CALCIUM in the last 72 hours.  PT/INR: No results for input(s): LABPROT, INR in the last 72 hours. ABG:  INR: Will add last result for INR, ABG once components are confirmed Will add last 4 CBG results once components are confirmed  Assessment/Plan:  1. CV - Apparently had a run of VT last night. 2.  Pulmonary -S/p bronch and intrabronchial valves. Chest tube with 400 cc last 24 hours. Chest tube is to suction and there is no air leak. As discussed with Dr. Laneta SimmersBartle, will place to water seal. CXR this am appears stable. Check CXR in am.   ZIMMERMAN,DONIELLE MPA-C 05/09/2017,8:43 AM   Chart reviewed, patient examined, agree with above. CXR looks ok without any significant ptx. No air leak so will put chest tube to water seal.

## 2017-05-10 ENCOUNTER — Inpatient Hospital Stay (HOSPITAL_COMMUNITY): Payer: Medicare Other

## 2017-05-10 LAB — CBC
HCT: 35.2 % — ABNORMAL LOW (ref 36.0–46.0)
HEMOGLOBIN: 11.1 g/dL — AB (ref 12.0–15.0)
MCH: 29 pg (ref 26.0–34.0)
MCHC: 31.5 g/dL (ref 30.0–36.0)
MCV: 91.9 fL (ref 78.0–100.0)
Platelets: 326 10*3/uL (ref 150–400)
RBC: 3.83 MIL/uL — ABNORMAL LOW (ref 3.87–5.11)
RDW: 12.9 % (ref 11.5–15.5)
WBC: 8.7 10*3/uL (ref 4.0–10.5)

## 2017-05-10 LAB — URINALYSIS, ROUTINE W REFLEX MICROSCOPIC
Bacteria, UA: NONE SEEN
GLUCOSE, UA: NEGATIVE mg/dL
Ketones, ur: NEGATIVE mg/dL
LEUKOCYTES UA: NEGATIVE
NITRITE: NEGATIVE
PH: 5 (ref 5.0–8.0)
Protein, ur: 100 mg/dL — AB
SPECIFIC GRAVITY, URINE: 1.029 (ref 1.005–1.030)

## 2017-05-10 LAB — BASIC METABOLIC PANEL
ANION GAP: 10 (ref 5–15)
BUN: 5 mg/dL — ABNORMAL LOW (ref 6–20)
CALCIUM: 8.6 mg/dL — AB (ref 8.9–10.3)
CO2: 26 mmol/L (ref 22–32)
CREATININE: 1.05 mg/dL — AB (ref 0.44–1.00)
Chloride: 95 mmol/L — ABNORMAL LOW (ref 101–111)
GFR calc non Af Amer: 53 mL/min — ABNORMAL LOW (ref >60)
Glucose, Bld: 153 mg/dL — ABNORMAL HIGH (ref 65–99)
Potassium: 3.5 mmol/L (ref 3.5–5.1)
SODIUM: 131 mmol/L — AB (ref 135–145)

## 2017-05-10 NOTE — Progress Notes (Addendum)
      [address]       [place] [id number]             [phone]       2 Days Post-Op Procedure(s) (LRB): VIDEO BRONCHOSCOPY WITH INSERTION OF INTERBRONCHIAL VALVE (IBV) (N/A)  Subjective: Patient still with intermittent pain at right chest tube site.  Objective: Vital signs in last 24 hours: Temp:  [99.1 F (37.3 C)-100.4 F (38 C)] 99.5 F (37.5 C) (09/02 0300) Pulse Rate:  [98-106] 106 (09/02 0300) Cardiac Rhythm: Normal sinus rhythm (09/02 0803) Resp:  [19-21] 20 (09/02 0300) BP: (96-133)/(61-111) 122/68 (09/02 0300) SpO2:  [95 %-98 %] 95 % (09/02 0734) Weight:  [209 lb 6.4 oz (95 kg)] 209 lb 6.4 oz (95 kg) (09/02 0300)     Intake/Output from previous day: 09/01 0701 - 09/02 0700 In: 980 [P.O.:980] Out: 75 [Chest Tube:75]   Physical Exam:  Cardiovascular: RRR Pulmonary: Diminished at bases Wounds: Dressing is clean and dry.   Chest Tube: to water seal, NO air leak  Lab Results: CBC:No results for input(s): WBC, HGB, HCT, PLT in the last 72 hours. BMET: No results for input(s): NA, K, CL, CO2, GLUCOSE, BUN, CREATININE, CALCIUM in the last 72 hours.  PT/INR: No results for input(s): LABPROT, INR in the last 72 hours. ABG:  INR: Will add last result for INR, ABG once components are confirmed Will add last 4 CBG results once components are confirmed  Assessment/Plan:  1. CV - SR  2.  Pulmonary -S/p bronch and intrabronchial valves. Chest tube with 75 cc last 24 hours. Chest tube is to water seal and there is NO air leak. CXR this am appears stable. Will leave chest tube for one more day and then likely remove in am. Check CXR in am. 3. Fever to 100.4. Await CBC. Per medicine.  ZIMMERMAN,DONIELLE MPA-C 05/10/2017,8:28 AM    Chart reviewed, patient examined, agree with above. No air leak from chest tube and CXR looks stable with no ptx. Will continue to water seal today and plan to remove tomorrow if no change.

## 2017-05-10 NOTE — Progress Notes (Signed)
PROGRESS NOTE    Kiara SeedsBarbara V Antenucci  ZOX:[id number] DOB: 1950-09-20 DOA: 04/18/2017 PCP: Pearline Cablesopland, Jessica C, MD    Brief Narrative:[name] is a 66 y.o. female with medical history significant of pulmonary sarcoidosis, follows up with Dr Vassie LollAlva presents with sudden onset of chest pain or sob. She was found to have large right pneumothorax with complete collapse. Cardiothoracic surgery consulted and she underwent chest tube placement on the rigth on 8/16, and s/p talk pleurodesis.  Over the last one week, she has air leek and repeat CXR this am shows increase in the pneumothorax to 20%.  Another issue is her pain at the site of the chest tube, which is better today.  Assessment & Plan:   Principal Problem:   Pneumothorax on right Active Problems:   PULMONARY SARCOIDOSIS   Mild persistent asthma in adult without complication with component of vcd   Chronic respiratory failure with hypoxia (HCC)   Spontaneous pneumothorax   Other chest pain   Goals of care, counseling/discussion   Palliative care by specialist  Right sided pneumothorax:  Resolved.  Cardiothoracic surgery following, she underwent IBV placement via bronchoscopy. No more leak, repeat CXR does not show any pneumothorax. Plan for water seal of the chest tube on 9/1.  Pain control and PT evaluation today.     Pulmonary sarcoidosis:  H/o chronic respiratory failure with hypoxia on 2l it of [place] oxygen at home.  Follows up with Dr Vassie LollAlva, no exacerbation this admission.     Constipation:  From pain meds.  Resume miralax, senna colace and dulcolax.  Improved.      Febrile overnight No new pneumonia on cxr. Wbc normal. UA is pending.  Lactic acid is 0.7.  No leukocytosis.  Blood cultures ordered and pending, neg so far. Pro calcitonin is 0.45     Mild normocytic anemia:  Possibly from anemia of chronic disease. Hemoglobin stable around 11.     DVT prophylaxis: (Lovenox) Code Status: (Full/) Family  Communication: none at bedside.  Disposition Plan: possible d/c in 1 to 2 days.    Consultants:   Cardiothoracic surgery.   pulmonology   Procedures: chest tube placement.    Antimicrobials: azithromycin till august 31st.    Subjective: persistent intermittent pain at the chest tube site.   Objective: Vitals:   05/09/17 1417 05/09/17 2100 05/10/17 0300 05/10/17 0734  BP: 96/61 (!) 133/111 122/68   Pulse: 98 (!) 106 (!) 106   Resp: (!) 21 19 20    Temp: 99.1 F (37.3 C) (!) 100.4 F (38 C) 99.5 F (37.5 C)   TempSrc: Oral Oral Oral   SpO2: 96% 95% 98% 95%  Weight:   95 kg (209 lb 6.4 oz)   Height:        Intake/Output Summary (Last 24 hours) at 05/10/17 1251 Last data filed at 05/10/17 0011  Gross per 24 hour  Intake              860 ml  Output               75 ml  Net              785 ml   Filed Weights   05/08/17 0636 05/09/17 0300 05/10/17 0300  Weight: 96.6 kg (213 lb) 94.1 kg (207 lb 8 oz) 95 kg (209 lb 6.4 oz)    Examination: no change in exam.  General exam: Appears calm and comfortable on 3 lit of [place] oxygen. Respiratory system:  clear to auscultation, no wheezing or rhonchi.  Cardiovascular system: S1 & S2 heard, RRR. No JVD, No pedal edema. Gastrointestinal system: abd is soft NT ND BS+ Central nervous system: Alert and oriented. No focal neurological deficits. Extremities: Symmetric 5 x 5 power. Skin: No rashes, lesions or ulcers Psychiatry: Judgement and insight appear normal. Mood & affect appropriate.     Data Reviewed: I have personally reviewed following labs and imaging studies  CBC:  Recent Labs Lab 05/05/17 2330 05/10/17 0948  WBC 10.0 8.7  NEUTROABS 6.2  --   HGB 11.3* 11.1*  HCT 35.6* 35.2*  MCV 92.7 91.9  PLT 286 326   Basic Metabolic Panel:  Recent Labs Lab 05/05/17 2330 05/10/17 0948  NA 133* 131*  K 4.3 3.5  CL 97* 95*  CO2 28 26  GLUCOSE 100* 153*  BUN 7 5*  CREATININE 1.04* 1.05*  CALCIUM 8.7* 8.6*    GFR: Estimated Creatinine Clearance: 58.4 mL/min (A) (by C-G formula based on SCr of 1.05 mg/dL (H)). Liver Function Tests: No results for input(s): AST, ALT, ALKPHOS, BILITOT, PROT, ALBUMIN in the last 168 hours. No results for input(s): LIPASE, AMYLASE in the last 168 hours. No results for input(s): AMMONIA in the last 168 hours. Coagulation Profile: No results for input(s): INR, PROTIME in the last 168 hours. Cardiac Enzymes: No results for input(s): CKTOTAL, CKMB, CKMBINDEX, TROPONINI in the last 168 hours. BNP (last 3 results) No results for input(s): PROBNP in the last 8760 hours. HbA1C: No results for input(s): HGBA1C in the last 72 hours. CBG: No results for input(s): GLUCAP in the last 168 hours. Lipid Profile: No results for input(s): CHOL, HDL, LDLCALC, TRIG, CHOLHDL, LDLDIRECT in the last 72 hours. Thyroid Function Tests: No results for input(s): TSH, T4TOTAL, FREET4, T3FREE, THYROIDAB in the last 72 hours. Anemia Panel: No results for input(s): VITAMINB12, FOLATE, FERRITIN, TIBC, IRON, RETICCTPCT in the last 72 hours. Sepsis Labs:  Recent Labs Lab 05/05/17 2330 05/06/17 0206 05/07/17 0555 05/09/17 0548  PROCALCITON 0.44  --  0.36 0.45  LATICACIDVEN 0.7 0.7  --   --     Recent Results (from the past 240 hour(s))  Culture, blood (Routine X 2) w Reflex to ID Panel     Status: None (Preliminary result)   Collection Time: 05/05/17 11:30 PM  Result Value Ref Range Status   Specimen Description BLOOD RIGHT ARM  Final   Special Requests   Final    BOTTLES DRAWN AEROBIC AND ANAEROBIC Blood Culture adequate volume   Culture NO GROWTH 4 DAYS  Final   Report Status PENDING  Incomplete  Culture, blood (Routine X 2) w Reflex to ID Panel     Status: None (Preliminary result)   Collection Time: 05/05/17 11:39 PM  Result Value Ref Range Status   Specimen Description BLOOD LEFT HAND  Final   Special Requests IN PEDIATRIC BOTTLE Blood Culture adequate volume  Final    Culture NO GROWTH 4 DAYS  Final   Report Status PENDING  Incomplete         Radiology Studies: Dg Chest Port 1 View  Result Date: 05/10/2017 CLINICAL DATA:  Follow-up right pneumothorax with chest tube in place. EXAM: PORTABLE CHEST 1 VIEW COMPARISON:  05/09/2017, 05/07/2017 and earlier. FINDINGS: Right chest tube in place without visible pneumothorax. Pleural thickening and/or loculated fluid involving the lateral right pleural space. Chronic interstitial pulmonary fibrosis, unchanged. No new pulmonary parenchymal abnormalities. Small amount of subcutaneous emphysema in the right lateral chest  wall, unchanged. IMPRESSION: 1. Right chest tube in place with no visible pneumothorax. 2. Pleural thickening and/or loculated fluid involving the right lateral pleural space. 3. Chronic interstitial fibrosis. No new/acute pulmonary parenchymal abnormalities. Electronically Signed   By: Hulan Saas M.D.   On: 05/10/2017 07:23   Dg Chest Port 1 View  Result Date: 05/09/2017 CLINICAL DATA:  Pneumothorax, right chest tube EXAM: PORTABLE CHEST 1 VIEW COMPARISON:  05/07/2017 FINDINGS: Stable right chest tube position extending to the apex. Suspect similar small residual right apical pneumothorax without significant change. Extensive reticulonodular interstitial opacities and parenchymal scarring bilaterally compatible with history of sarcoid. Slightly improved lung volumes. Bronchial valves noted in the right hilum. No large effusion. Trachea is midline. Stable heart size and vascularity. IMPRESSION: Suspect stable right apical small pneumothorax. Stable chest tube position. Slight improvement in lung volumes with similar chronic lung disease. Electronically Signed   By: Judie Petit.  Shick M.D.   On: 05/09/2017 09:36        Scheduled Meds: . arformoterol  15 mcg Nebulization BID  . bisacodyl  10 mg Rectal Daily  . budesonide (PULMICORT) nebulizer solution  0.5 mg Nebulization BID  . buPROPion  300 mg Oral Daily   . chlorpheniramine-HYDROcodone  5 mL Oral Q12H  . clotrimazole  10 mg Oral 5 X Daily  . cycloSPORINE  1 drop Both Eyes BID  . enoxaparin (LOVENOX) injection  40 mg Subcutaneous Q24H  . famotidine  20 mg Oral BID  . fluticasone  1 spray Each Nare TID  . lidocaine  1 patch Transdermal Q24H  . loratadine  10 mg Oral Daily  . multivitamin with minerals  1 tablet Oral Daily  . oxyCODONE  5 mg Oral Q4H while awake   And  . oxyCODONE  5 mg Oral QHS  . pantoprazole  40 mg Oral Daily  . polyethylene glycol  17 g Oral Daily  . senna-docusate  1 tablet Oral BID   Continuous Infusions: . lactated ringers Stopped (05/08/17 0715)     LOS: 22 days    Time spent: 35 minutes.     Kathlen Mody, MD [facility] Pager [phone]   If 7PM-7AM, please contact night-coverage [email or web address] Password [email or web address] 05/10/2017, 12:51 PM

## 2017-05-11 ENCOUNTER — Encounter (HOSPITAL_COMMUNITY): Payer: Self-pay | Admitting: Thoracic Surgery (Cardiothoracic Vascular Surgery)

## 2017-05-11 ENCOUNTER — Inpatient Hospital Stay (HOSPITAL_COMMUNITY): Payer: Medicare Other

## 2017-05-11 LAB — CULTURE, BLOOD (ROUTINE X 2)
CULTURE: NO GROWTH
Culture: NO GROWTH
SPECIAL REQUESTS: ADEQUATE
SPECIAL REQUESTS: ADEQUATE

## 2017-05-11 NOTE — Progress Notes (Signed)
Chest tube removed per protocol.  Pt tolerated well, will continue to monitor.

## 2017-05-11 NOTE — Progress Notes (Addendum)
      [address]       [place] [id number]             [phone]       3 Days Post-Op Procedure(s) (LRB): VIDEO BRONCHOSCOPY WITH INSERTION OF INTERBRONCHIAL VALVE (IBV) (N/A)  Subjective: Patient still with intermittent pain at right chest tube site. She is looking forward to getting chest tube out.  Objective: Vital signs in last 24 hours: Temp:  [99.6 F (37.6 C)-99.7 F (37.6 C)] 99.6 F (37.6 C) (09/03 0444) Pulse Rate:  [105-111] 105 (09/03 0444) Cardiac Rhythm: Sinus tachycardia (09/02 2130) Resp:  [22-26] 22 (09/03 0444) BP: (97-115)/(52-76) 108/69 (09/03 0444) SpO2:  [88 %-100 %] 94 % (09/03 0444) Weight:  [209 lb 4.8 oz (94.9 kg)] 209 lb 4.8 oz (94.9 kg) (09/03 0444)     Intake/Output from previous day: 09/02 0701 - 09/03 0700 In: 1110 [P.O.:1110] Out: 100 [Urine:100]   Physical Exam:  Cardiovascular: RRR Pulmonary: Slightly diminished at bases Wounds: Dressing is clean and dry.   Chest Tube: to water seal, NO air leak  Lab Results: CBC:  Recent Labs  05/10/17 0948  WBC 8.7  HGB 11.1*  HCT 35.2*  PLT 326   BMET:   Recent Labs  05/10/17 0948  NA 131*  K 3.5  CL 95*  CO2 26  GLUCOSE 153*  BUN 5*  CREATININE 1.05*  CALCIUM 8.6*    PT/INR: No results for input(s): LABPROT, INR in the last 72 hours. ABG:  INR: Will add last result for INR, ABG once components are confirmed Will add last 4 CBG results once components are confirmed  Assessment/Plan:  1. CV - SR  2.  Pulmonary -S/p bronch and intrabronchial valves. Chest tube with 75 cc last 24 hours. Chest tube is to water seal and there is NO air leak. CXR this am appears stable (small right apical pneumothorax, right pleural effusion). Will remove chest tube. Check CXR in am.   ZIMMERMAN,DONIELLE MPA-C 05/11/2017,7:57 AM     Chart reviewed, patient examined, agree with above.

## 2017-05-11 NOTE — Progress Notes (Signed)
PROGRESS NOTE    [name]  [name] DOB: 12-09-1950 DOA: 04/18/2017 PCP: Pearline Cables, MD    Brief Narrative:[name] is a 66 y.o. female with medical history significant of pulmonary sarcoidosis, follows up with [name] presents with sudden onset of chest pain or sob. She was found to have large right pneumothorax with complete collapse. Cardiothoracic surgery consulted and she underwent chest tube placement on the rigth on 8/16, and s/p talk pleurodesis.  Over the last one week, she has air leek and repeat CXR this am shows increase in the pneumothorax to 20%.  Another issue is her pain at the site of the chest tube, which is better today. She underwent IBV via video bronchoscopy on 9/1 Chest tube was removed on 9/3. She tolerated and repeat CXR pending.   Assessment & Plan:   Principal Problem:   Pneumothorax on right Active Problems:   PULMONARY SARCOIDOSIS   Mild persistent asthma in adult without complication with component of vcd   Chronic respiratory failure with hypoxia (HCC)   Spontaneous pneumothorax   Other chest pain   Goals of care, counseling/discussion   Palliative care by specialist  Right sided pneumothorax:  Resolved.  Cardiothoracic surgery following, she underwent IBV placement via video bronchoscopy. No more leak, repeat CXR does not show any pneumothorax. underwent water seal of the chest tube on 9/1.  Pain control and PT evaluation today.  Chest tube removed on 9/3. She tolerated it without any complications.     Pulmonary sarcoidosis:  H/o chronic respiratory failure with hypoxia on 2l it of [place] oxygen at home.  Follows up with [name], no exacerbation this admission.     Constipation:  From pain meds.  Resume miralax, senna colace and dulcolax.  Improved.      Fever: none overnight.  No new pneumonia on cxr. Wbc normal. UA is negative for infection.  Lactic acid is 0.7.  No leukocytosis.  Blood cultures ordered and  pending, neg so far. Pro calcitonin is 0.45     Mild normocytic anemia:  Possibly from anemia of chronic disease. Hemoglobin stable around 11.     DVT prophylaxis: (Lovenox) Code Status: (Full/) Family Communication: none at bedside.  Disposition Plan: possible d/c in am home with home health.   Consultants:   Cardiothoracic surgery.   pulmonology   Procedures: chest tube placement.   Chest tube removed on 9/3   Antimicrobials: azithromycin till august 31st.    Subjective: In good spirits. No new complaints.   Objective: Vitals:   05/11/17 0943 05/11/17 0946 05/11/17 1144 05/11/17 1621  BP:   94/69   Pulse:   100   Resp:   (!) 23   Temp:   98.5 F (36.9 C) 98.4 F (36.9 C)  TempSrc:   Oral Oral  SpO2: 94% 95% 92%   Weight:      Height:        Intake/Output Summary (Last 24 hours) at 05/11/17 1638 Last data filed at 05/11/17 0900  Gross per 24 hour  Intake             1230 ml  Output                0 ml  Net             1230 ml   Filed Weights   05/09/17 0300 05/10/17 0300 05/11/17 0444  Weight: 94.1 kg (207 lb 8 oz) 95 kg (209 lb 6.4  oz) 94.9 kg (209 lb 4.8 oz)    Exam.  General exam: Appears calm and comfortable on 3 lit of [place] oxygen. Respiratory system: good air entry bilateral. No wheezing or rhonchi.  Cardiovascular system: S1 & S2 heard, RRR. No JVD, No pedal edema. Gastrointestinal system: abd is soft non tender non distended bowel sounds.  Central nervous system: Alert and oriented. No focal neurological deficits. Extremities: Symmetric 5 x 5 power. Skin: No rashes, lesions or ulcers Psychiatry: Judgement and insight appear normal. Mood & affect appropriate.     Data Reviewed: I have personally reviewed following labs and imaging studies  CBC:  Recent Labs Lab 05/05/17 2330 05/10/17 0948  WBC 10.0 8.7  NEUTROABS 6.2  --   HGB 11.3* 11.1*  HCT 35.6* 35.2*  MCV 92.7 91.9  PLT 286 326   Basic Metabolic Panel:  Recent  Labs Lab 05/05/17 2330 05/10/17 0948  NA 133* 131*  K 4.3 3.5  CL 97* 95*  CO2 28 26  GLUCOSE 100* 153*  BUN 7 5*  CREATININE 1.04* 1.05*  CALCIUM 8.7* 8.6*   GFR: Estimated Creatinine Clearance: 58.4 mL/min (A) (by C-G formula based on SCr of 1.05 mg/dL (H)). Liver Function Tests: No results for input(s): AST, ALT, ALKPHOS, BILITOT, PROT, ALBUMIN in the last 168 hours. No results for input(s): LIPASE, AMYLASE in the last 168 hours. No results for input(s): AMMONIA in the last 168 hours. Coagulation Profile: No results for input(s): INR, PROTIME in the last 168 hours. Cardiac Enzymes: No results for input(s): CKTOTAL, CKMB, CKMBINDEX, TROPONINI in the last 168 hours. BNP (last 3 results) No results for input(s): PROBNP in the last 8760 hours. HbA1C: No results for input(s): HGBA1C in the last 72 hours. CBG: No results for input(s): GLUCAP in the last 168 hours. Lipid Profile: No results for input(s): CHOL, HDL, LDLCALC, TRIG, CHOLHDL, LDLDIRECT in the last 72 hours. Thyroid Function Tests: No results for input(s): TSH, T4TOTAL, FREET4, T3FREE, THYROIDAB in the last 72 hours. Anemia Panel: No results for input(s): VITAMINB12, FOLATE, FERRITIN, TIBC, IRON, RETICCTPCT in the last 72 hours. Sepsis Labs:  Recent Labs Lab 05/05/17 2330 05/06/17 0206 05/07/17 0555 05/09/17 0548  PROCALCITON 0.44  --  0.36 0.45  LATICACIDVEN 0.7 0.7  --   --     Recent Results (from the past 240 hour(s))  Culture, blood (Routine X 2) w Reflex to ID Panel     Status: None   Collection Time: 05/05/17 11:30 PM  Result Value Ref Range Status   Specimen Description BLOOD RIGHT ARM  Final   Special Requests   Final    BOTTLES DRAWN AEROBIC AND ANAEROBIC Blood Culture adequate volume   Culture NO GROWTH 5 DAYS  Final   Report Status 05/11/2017 FINAL  Final  Culture, blood (Routine X 2) w Reflex to ID Panel     Status: None   Collection Time: 05/05/17 11:39 PM  Result Value Ref Range Status    Specimen Description BLOOD LEFT HAND  Final   Special Requests IN PEDIATRIC BOTTLE Blood Culture adequate volume  Final   Culture NO GROWTH 5 DAYS  Final   Report Status 05/11/2017 FINAL  Final         Radiology Studies: Dg Chest Port 1 View  Result Date: 05/11/2017 CLINICAL DATA:  Right-sided pneumothorax. EXAM: PORTABLE CHEST 1 VIEW COMPARISON:  One-view chest x-ray 05/10/2017 FINDINGS: Heart is enlarged. Interstitial edema is slightly increased. Right pleural effusion is unchanged. A right-sided chest  tube remains in place. The side port is at the chest wall. A small apical pneumothorax is present. IMPRESSION: 1. Right-sided chest tube in place with small apical pneumothorax. 2. Persistent right pleural effusion. 3. Slight increase in interstitial edema. Electronically Signed   By: Marin Roberts M.D.   On: 05/11/2017 07:29   Dg Chest Port 1 View  Result Date: 05/10/2017 CLINICAL DATA:  Follow-up right pneumothorax with chest tube in place. EXAM: PORTABLE CHEST 1 VIEW COMPARISON:  05/09/2017, 05/07/2017 and earlier. FINDINGS: Right chest tube in place without visible pneumothorax. Pleural thickening and/or loculated fluid involving the lateral right pleural space. Chronic interstitial pulmonary fibrosis, unchanged. No new pulmonary parenchymal abnormalities. Small amount of subcutaneous emphysema in the right lateral chest wall, unchanged. IMPRESSION: 1. Right chest tube in place with no visible pneumothorax. 2. Pleural thickening and/or loculated fluid involving the right lateral pleural space. 3. Chronic interstitial fibrosis. No new/acute pulmonary parenchymal abnormalities. Electronically Signed   By: Hulan Saas M.D.   On: 05/10/2017 07:23        Scheduled Meds: . arformoterol  15 mcg Nebulization BID  . bisacodyl  10 mg Rectal Daily  . budesonide (PULMICORT) nebulizer solution  0.5 mg Nebulization BID  . buPROPion  300 mg Oral Daily  . chlorpheniramine-HYDROcodone  5  mL Oral Q12H  . clotrimazole  10 mg Oral 5 X Daily  . cycloSPORINE  1 drop Both Eyes BID  . enoxaparin (LOVENOX) injection  40 mg Subcutaneous Q24H  . famotidine  20 mg Oral BID  . fluticasone  1 spray Each Nare TID  . lidocaine  1 patch Transdermal Q24H  . loratadine  10 mg Oral Daily  . multivitamin with minerals  1 tablet Oral Daily  . oxyCODONE  5 mg Oral Q4H while awake   And  . oxyCODONE  5 mg Oral QHS  . pantoprazole  40 mg Oral Daily  . polyethylene glycol  17 g Oral Daily  . senna-docusate  1 tablet Oral BID   Continuous Infusions: . lactated ringers Stopped (05/08/17 0715)     LOS: 23 days    Time spent: 35 minutes.     Kathlen Mody, MD [facility] Pager [phone]   If 7PM-7AM, please contact night-coverage [email or web address] Password [facility] 05/11/2017, 4:38 PM

## 2017-05-12 ENCOUNTER — Inpatient Hospital Stay (HOSPITAL_COMMUNITY): Payer: Medicare Other

## 2017-05-12 MED ORDER — HYDROCOD POLST-CPM POLST ER 10-8 MG/5ML PO SUER: 5.0000 mL | Freq: Two times a day (BID) | ORAL | 0 refills | Status: DC

## 2017-05-12 NOTE — Care Management Important Message (Signed)
Important Message  Patient Details  Name: [name] MRN: [id number] Date of Birth: 03/05/1951   Medicare Important Message Given:  Yes    [name] Abena 05/12/2017, 11:03 AM

## 2017-05-12 NOTE — Progress Notes (Addendum)
      [address]       [place] [id number]             [phone]      4 Days Post-Op Procedure(s) (LRB): VIDEO BRONCHOSCOPY WITH INSERTION OF INTERBRONCHIAL VALVE (IBV) (N/A) Subjective: Feels okay this morning. Ready to go home.   Objective: Vital signs in last 24 hours: Temp:  [98.4 F (36.9 C)-99.7 F (37.6 C)] 98.7 F (37.1 C) (09/04 0325) Pulse Rate:  [100-101] 101 (09/03 2125) Cardiac Rhythm: Normal sinus rhythm;Sinus tachycardia (09/03 2215) Resp:  [16-23] 22 (09/04 0325) BP: (94-115)/(69-73) 97/71 (09/04 0325) SpO2:  [92 %-99 %] 94 % (09/04 0325) Weight:  [94.2 kg (207 lb 9.6 oz)] 94.2 kg (207 lb 9.6 oz) (09/04 0325)     Intake/Output from previous day: 09/03 0701 - 09/04 0700 In: 340 [P.O.:340] Out: 1000 [Urine:1000] Intake/Output this shift: No intake/output data recorded.  General appearance: alert, cooperative and no distress Heart: RRR Lungs: rhonchi throughout, diminished in the lower lobes Extremities: extremities normal, atraumatic, no cyanosis or edema Wound: clean and dry  Lab Results:  Recent Labs  05/10/17 0948  WBC 8.7  HGB 11.1*  HCT 35.2*  PLT 326   BMET:  Recent Labs  05/10/17 0948  NA 131*  K 3.5  CL 95*  CO2 26  GLUCOSE 153*  BUN 5*  CREATININE 1.05*  CALCIUM 8.6*    PT/INR: No results for input(s): LABPROT, INR in the last 72 hours. ABG    Component Value Date/Time   HCO3 26.6 04/18/2017 1746   TCO2 28 04/18/2017 1746   O2SAT 92.0 04/18/2017 1746   CBG (last 3)  No results for input(s): GLUCAP in the last 72 hours.  Assessment/Plan: S/P Procedure(s) (LRB): VIDEO BRONCHOSCOPY WITH INSERTION OF INTERBRONCHIAL VALVE (IBV) (N/A)  1. CV - SR  2.  Pulmonary -S/p bronch and intrabronchial valves. Chest tube removed yesterday. CXR this morning shows tiny residual right apical pneumothorax following removal of right chest tube, unchanged. Stable diffuse interstitial prominence, likely fibrosis.    Plan: possible discharge today per medicine. Follow-up appointment is on 9/19.      LOS: 24 days    Sharlene Doryessa N Conte 05/12/2017  resolution of pneumothorax  after removal of chest tube Ready for discharge Will leave chest tube suture in place until office followup  patient examined, todays CXR  and medical record reviewed,agree with above note. Kathlee Nationseter Van Trigt III 05/12/2017

## 2017-05-12 NOTE — Progress Notes (Signed)
Daily Progress Note   Patient Name: Kiara SeedsBarbara V Caporale       Date: 05/12/2017 DOB: 1950/12/20  Age: 66 y.o. MRN#: [id number] Attending Physician: Kathlen ModyAkula, Vijaya, MD Primary Care Physician: Pearline Cablesopland, Jessica C, MD Admit Date: 04/18/2017  Reason for Consultation/Follow-up: Pain control and Psychosocial/spiritual support  Subjective: Mrs. Stevan BornHylton resting comfortably .  Chest tube removed yesterday, "I feel better, just a little tired".  Reports that she "doesn't need the pills for pain anymore"      Length of Stay: 24  Current Medications: Scheduled Meds:  . arformoterol  15 mcg Nebulization BID  . bisacodyl  10 mg Rectal Daily  . budesonide (PULMICORT) nebulizer solution  0.5 mg Nebulization BID  . buPROPion  300 mg Oral Daily  . chlorpheniramine-HYDROcodone  5 mL Oral Q12H  . clotrimazole  10 mg Oral 5 X Daily  . cycloSPORINE  1 drop Both Eyes BID  . enoxaparin (LOVENOX) injection  40 mg Subcutaneous Q24H  . famotidine  20 mg Oral BID  . fluticasone  1 spray Each Nare TID  . lidocaine  1 patch Transdermal Q24H  . loratadine  10 mg Oral Daily  . multivitamin with minerals  1 tablet Oral Daily  . pantoprazole  40 mg Oral Daily  . polyethylene glycol  17 g Oral Daily  . senna-docusate  1 tablet Oral BID    Continuous Infusions: . lactated ringers Stopped (05/08/17 0715)    PRN Meds: acetaminophen, levalbuterol, LORazepam, ondansetron **OR** ondansetron (ZOFRAN) IV, oxyCODONE **OR** [DISCONTINUED]  morphine injection  Physical Exam  Constitutional: She appears well-developed. She appears ill.  HENT:  Oropharynx is clear and moist  Cardiovascular: Tachycardia present.   Pulmonary/Chest: Effort normal and breath sounds normal.  -shallow breathing pattern  Neurological: She is alert.  Skin: Skin is warm and dry.      Vital  Signs: BP 97/71 (BP Location: Right Arm)   Pulse (!) 101   Temp 98.7 F (37.1 C) (Oral)   Resp (!) 22   Ht 5\' 5"  (1.651 m)   Wt 94.2 kg (207 lb 9.6 oz)   SpO2 95%   BMI 34.55 kg/m  SpO2: SpO2: 95 % O2 Device: O2 Device: Nasal Cannula O2 Flow Rate: O2 Flow Rate (L/min): 2 L/min  Intake/output summary:   Intake/Output Summary (Last 24 hours) at 05/12/17 0818 Last data filed at 05/12/17 0325  Gross per 24 hour  Intake              340 ml  Output             1000 ml  Net             -660 ml   LBM: Last BM Date: 05/11/17 Baseline Weight: Weight: 100.4 kg (221 lb 6.4 oz) Most recent weight: Weight: 94.2 kg (207 lb 9.6 oz)  Palliative Assessment/Data: PPS 70%    Patient Active Problem List   Diagnosis Date Noted  . Other chest pain   . Goals of care, counseling/discussion   . Palliative care by specialist   . Pneumothorax on right 04/18/2017  . Spontaneous pneumothorax 04/18/2017  . SOB (shortness of breath) 06/05/2016  . Osteopenia 02/12/2016  .  Lumbar radiculopathy 01/26/2015  . Chronic respiratory failure with hypoxia (HCC) 04/12/2014  . Obesity (BMI 30-39.9) 02/08/2014  . Allergic rhinitis 09/05/2010  . IBS 09/05/2010  . GERD 02/18/2010  . Barrett's esophagus 02/12/2010  . Upper airway cough syndrome 11/02/2009  . Anxiety state 08/29/2008  . History of colonic polyps 08/29/2008  . PULMONARY SARCOIDOSIS 08/12/2007  . Mild persistent asthma in adult without complication with component of vcd 08/12/2007  . Diaphragmatic hernia 08/12/2007  . Migraine 08/12/2007    Palliative Care Assessment & Plan   HPI: 66 y.o. female  with past medical history of pulmonary sarcoid (diagnosed in 1995, followed by Dr. Vassie Loll, just tapered off Prednisone July 15th), asthma, anxiety, and GERD. She presented to the ED from home with acute onset right sided chest pain and shortness of breath. Imaging in ED noted large right pneumothorax with complete lung collapse.  She was admitted on  04/18/2017. Cardiothoracic surgery following, s/p chest tube placement and talc placement (to prevent further lung collapse).   04-08-17    Endobronchial valve placement, chest tube out.  Patient denies chest pain.  Assessment:  Mrs. Bonilla has struggled with pain control since placement of chest tube then after talc placement.  She reports no chronic pain prior to this admission  Pain relief once chest tube was achieved.  Multiple co-morbid ites.   Recommendations/Plan:   Full code, full scope aggressive treatment Discussed with patient the importance of continued conversation with family and their  medical providers regarding overall plan of care and treatment options,  ensuring decisions are within the context of the patients values and GOCs.  Pain Management   - Scheduled Oxycodone discontinued - PRN Tylenol and Oxycodone - Discussed continued  utilization of pillow splinting on movement to reduce  -   Importance of mobility and AROM exercises stressed  Goals of Care and Additional Recommendations:  Limitations on Scope of Treatment: Full Scope Treatment  Prognosis:   Unable to determine  Discharge Planning:   Anticipate discharge home in the next 24-48 hours  Home with Home Health  Discussed with nursing  Thank you for allowing the Palliative Medicine Team to assist in the care of this patient.  Time in 0800  Time out 0820   Total time: 20 minutes    Greater than 50%  of this time was spent counseling and coordinating care related to the above assessment and plan.  Lorinda Creed NP  Palliative Medicine Team Team Phone # [phone] Pager [phone]

## 2017-05-12 NOTE — Discharge Summary (Signed)
Physician Discharge Summary  [name] [name] DOB: Jun 03, 1951 DOA: 04/18/2017  PCP: Pearline Cables, MD  Admit date: 04/18/2017 Discharge date: 05/12/2017  Admitted From: Home.  Disposition: HOme.  Recommendations for Outpatient Follow-up:  1. Follow up with PCP in 1-2 weeks 2. Please obtain BMP/CBC in one week 3. Please follow up with cardiothoracic surgery as recommended.  4. You will need a repeat CXR in before your visit.    Discharge Condition: stable.  CODE STATUS: full code.  Diet recommendation: Heart Healthy / Brief/Interim Summary: Kiara Caccamo Hyltonis a 66 y.o.femalewith medical history significant of pulmonary sarcoidosis, follows up with [name] presents with sudden onset of chest pain or sob. She was found to have large right pneumothorax with complete collapse. Cardiothoracic surgery consulted and she underwent chest tube placement on the rigth on 8/16, and s/p talk pleurodesis.  Over the last one week, she has air leek and repeat CXR this am shows increase in the pneumothorax to 20%.  Another issue is her pain at the site of the chest tube, which is better today. She underwent IBV via video bronchoscopy on 9/1 Chest tube was removed on 9/3. She tolerated and repeat CXR does not show any pneumothorax.    Discharge Diagnoses:  Principal Problem:   Pneumothorax on right Active Problems:   PULMONARY SARCOIDOSIS   Mild persistent asthma in adult without complication with component of vcd   Chronic respiratory failure with hypoxia (HCC)   Spontaneous pneumothorax   Other chest pain   Goals of care, counseling/discussion   Palliative care by specialist  Right sided pneumothorax:  Resolved.  Cardiothoracic surgery following, she underwent IBV placement via video bronchoscopy. No more leak, repeat CXR does not show any pneumothorax. underwent water seal of the chest tube on 9/1.  Chest tube removed on 9/3. She tolerated it without any complications.   Repeat CXR does not show any new pneumothorax.  She was discharged to be followed by cardiothoracic surgery in one week at the office with a repeat CXR.     Pulmonary sarcoidosis:  H/o chronic respiratory failure with hypoxia on 2l it of [place] oxygen at home.  Follows up with [name], no exacerbation this admission.     Constipation:  From pain meds.  Resume miralax, senna colace and dulcolax.  Improved.      Fever: none overnight.  No new pneumonia on cxr. Wbc normal. UA is negative for infection.  Lactic acid is 0.7.  No leukocytosis.  Blood cultures ordered and pending, neg so far. Pro calcitonin is 0.45     Mild normocytic anemia:  Possibly from anemia of chronic disease. Hemoglobin stable around 11.      Discharge Instructions  Discharge Instructions    Diet - low sodium heart healthy    Complete by:  As directed    Discharge instructions    Complete by:  As directed    Follow up with PCP in one week.  Please follow up with Dr Donata Clay as recommended.     Allergies as of 05/12/2017      Reactions   Gabapentin Other (See Comments)   Edema on 2 separate trials   Lactose Intolerance (gi) Diarrhea, Other (See Comments)   Stomach cramps   Penicillins Other (See Comments)   Unknown - childhood allergy Has patient had a PCN reaction causing immediate rash, facial/tongue/throat swelling, SOB or lightheadedness with hypotension: Unknown Has patient had a PCN reaction causing severe rash involving mucus membranes  or skin necrosis: Unknown Has patient had a PCN reaction that required hospitalization: Unknown Has patient had a PCN reaction occurring within the last 10 years: No If all of the above answers are "NO", then may proceed with Cephalosporin use.   Pineapple Hives, Itching   Aspirin Other (See Comments)   gastritis      Medication List    STOP taking these medications   amitriptyline 10 MG tablet Commonly known as:  ELAVIL    vitamin C 1000 MG tablet     TAKE these medications   albuterol 108 (90 Base) MCG/ACT inhaler Commonly known as:  VENTOLIN HFA Inhale 2 puffs into the lungs as needed. What changed:  when to take this  reasons to take this   ALOE VERA JUICE PO Take 120 mLs by mouth daily.   ASTRAGALUS PO Take 1 tablet by mouth daily.   buPROPion 300 MG 24 hr tablet Commonly known as:  WELLBUTRIN XL Take 1 tablet (300 mg total) by mouth daily. What changed:  when to take this   CALCIUM-D PO Take 1 tablet by mouth daily.   chlorpheniramine-HYDROcodone 10-8 MG/5ML Suer Commonly known as:  TUSSIONEX Take 5 mLs by mouth every 12 (twelve) hours.   Collagen Hydrolysate Powd Take 1 scoop by mouth daily with supper.   cycloSPORINE 0.05 % ophthalmic emulsion Commonly known as:  RESTASIS Place 1 drop into both eyes 2 (two) times daily.   fluticasone 50 MCG/ACT nasal spray Commonly known as:  FLONASE Place 1 spray into both nostrils 3 (three) times daily.   furosemide 20 MG tablet Commonly known as:  LASIX TAKE 1 TABLET (20 MG TOTAL) BY MOUTH DAILY. USE AS NEEDED FOR SWELLING What changed:  when to take this  reasons to take this  additional instructions   ipratropium 17 MCG/ACT inhaler Commonly known as:  ATROVENT HFA Inhale 2 puffs into the lungs every 6 (six) hours as needed for wheezing.   loratadine 10 MG tablet Commonly known as:  CLARITIN Take 10 mg by mouth daily.   LORazepam 0.5 MG tablet Commonly known as:  ATIVAN TAKE 1/2 TO 1 TABLET BY MOUTH EVERY 8 TO 12 HRS ONLY AS NEEDED What changed:  See the new instructions.   multivitamin with minerals Tabs tablet Take 1 tablet by mouth daily.   OXYGEN Inhale 2 L into the lungs continuous.   pantoprazole 20 MG tablet Commonly known as:  PROTONIX TAKE 1 TABLET (20 MG TOTAL) BY MOUTH DAILY. What changed:  Another medication with the same name was removed. Continue taking this medication, and follow the directions you  see here.   PREBIOTIC PRODUCT PO Take 1 scoop by mouth daily before breakfast.   ranitidine 150 MG tablet Commonly known as:  ZANTAC TAKE 1 TABLET (150 MG TOTAL) BY MOUTH 2 (TWO) TIMES DAILY.   RHINASE Soln Place 2 sprays into the nose 2 (two) times daily as needed (dry, bloody nostrils).   SYMBICORT 160-4.5 MCG/ACT inhaler Generic drug:  budesonide-formoterol INHALE 2 PUFF INTO THE LUNGS TWICE A DAY AS NEEDED What changed:  See the new instructions.   Vitamin B-12 5000 MCG Tbdp Take 5,000 mcg by mouth daily.   VITAMIN C PO Take 1 tablet by mouth daily.            Discharge Care Instructions        Start     Ordered   05/12/17 0000  chlorpheniramine-HYDROcodone (TUSSIONEX) 10-8 MG/5ML SUER  Every 12 hours  05/12/17 1321   05/12/17 0000  Diet - low sodium heart healthy     05/12/17 1321   05/12/17 0000  Discharge instructions    Comments:  Follow up with PCP in one week.  Please follow up with Dr Donata Clay as recommended.   05/12/17 1321     Follow-up Information    [name], MD Follow up on 05/27/2017.   Specialty:  Cardiothoracic Surgery Why:  Appoinment is at 2:30, please arrive at 2:00pm for a chest xray located at [facility] which is located on the first floor of our building.  Contact information: [address] [employer] [address] [place] [address] [phone]        Pearline Cables, MD. Call in 1 day(s).   Specialty:  Family Medicine Contact information: [address] [place] [address] [phone]          Allergies  Allergen Reactions  . Gabapentin Other (See Comments)    Edema on 2 separate trials  . Lactose Intolerance (Gi) Diarrhea and Other (See Comments)    Stomach cramps  . Penicillins Other (See Comments)    Unknown - childhood allergy Has patient had a PCN reaction causing immediate rash, facial/tongue/throat swelling, SOB or lightheadedness with hypotension: Unknown Has patient had a  PCN reaction causing severe rash involving mucus membranes or skin necrosis: Unknown Has patient had a PCN reaction that required hospitalization: Unknown Has patient had a PCN reaction occurring within the last 10 years: No If all of the above answers are "NO", then may proceed with Cephalosporin use.  Marland Kitchen Pineapple Hives and Itching  . Aspirin Other (See Comments)    gastritis    Consultations:  CARDIOTHORACIC SURGERY.    Procedures/Studies: Dg Chest 2 View  Result Date: 05/12/2017 CLINICAL DATA:  Pneumothorax, shortness of Breath EXAM: CHEST  2 VIEW COMPARISON:  05/11/2017 FINDINGS: Small lucency at the right apex is stable compatible with tiny right apical pneumothorax. Interval removal of right chest tube. Diffuse interstitial prominence throughout the lungs. Heart is normal size. IMPRESSION: Tiny residual right apical pneumothorax following removal of right chest tube, unchanged. Stable diffuse interstitial prominence, likely fibrosis. Electronically Signed   By: Charlett Nose M.D.   On: 05/12/2017 07:21   Dg Chest 2 View  Result Date: 04/26/2017 CLINICAL DATA:  Pt admitted for pneumothorax. Pt has chest tube in right lung. Hx of asthma, barrett esophagus and pulmonary sarcoidosis. EXAM: CHEST  2 VIEW COMPARISON:  04/25/2017 FINDINGS: Moderate right pneumothorax, which appears mildly increased in size from the previous day's study. There is extensive subcutaneous emphysema mostly on the right, without change. Right chest tube tip projects at the right apex, also stable. Lungs show irregular interstitial thickening bilaterally consistent with fibrosis, stable. No left pneumothorax. IMPRESSION: 1. Mild increase in the size of the right pneumothorax since the previous day's study. 2. No other change. Electronically Signed   By: Amie Portland M.D.   On: 04/26/2017 10:45   Ct Chest Wo Contrast  Addendum Date: 04/24/2017   ADDENDUM REPORT: 04/24/2017 17:21 ADDENDUM: Upon further review, when the  exam from 04/19/2017 is compared with more remote prior study 03/07/2014, the extent of fibrotic changes throughout the lung bases in particular appears stable. Previously, the examination was compared with chest CT 06/05/2016 which was affected by some areas of airspace consolidation throughout these fibrotic regions which underestimated the severity of fibrosis on the prior 2017 examination leading to incorrect assessment of progression  of disease on the 04/19/2017 exam. Given the stability of findings dating back to 2015, and the patient's history of sarcoidosis, the fibrotic changes throughout all aspects of the lungs are presumably related to underlying sarcoidosis, rather than coexistent interstitial lung disease such as usual interstitial pneumonia. Electronically Signed   By: Trudie Reed M.D.   On: 04/24/2017 17:21   Result Date: 04/24/2017 CLINICAL DATA:  66 year old female with history of right-sided pneumothorax. EXAM: CT CHEST WITHOUT CONTRAST TECHNIQUE: Multidetector CT imaging of the chest was performed following the standard protocol without IV contrast. COMPARISON:  Chest CTA 05/28/2016. FINDINGS: Cardiovascular: Heart size is normal. There is no significant pericardial fluid, thickening or pericardial calcification. Aortic atherosclerosis. No definite coronary artery calcifications. Dilatation of the pulmonic trunk (3.7 cm in diameter). Mediastinum/Nodes: Multiple enlarged mediastinal and hilar lymph nodes are again noted, measuring up to 1.4 cm in short axis in the prevascular nodal station and 1.9 cm in short axis in the low right paratracheal nodal station. Esophagus is unremarkable in appearance. No axillary lymphadenopathy. Lungs/Pleura: Widespread areas of traction bronchiectasis and honeycombing are noted, most evident throughout the mid to lower lungs bilaterally, but also present in the posterior aspect of the upper lobes of the lungs bilaterally. These findings have clearly  progressed compared to prior study 06/06/2016. No acute consolidative airspace disease. No pleural effusions. Small right-sided pneumothorax, predominantly in the apex of the right hemithorax. Right-sided chest tube in position with pigtail reformed inside the right hemithorax up against the chest wall. Some apical bullous changes are noted bilaterally (right greater than left). Mild centrilobular and paraseptal emphysema is noted. Upper Abdomen: Suture line near the gastroesophageal junction. Status post cholecystectomy. Musculoskeletal: Extensive subcutaneous emphysema throughout the right chest wall tracking caudally into the right flank subcutaneous fat and musculature, and tracking cephalad into the lower right cervical region. There are no aggressive appearing lytic or blastic lesions noted in the visualized portions of the skeleton. IMPRESSION: 1. Right-sided chest tube appears appropriately positioned. There is a very small residual right-sided pneumothorax. 2. The appearance of lungs is unusual. Overall, the spectrum of fibrotic changes is favored to reflect progressive usual interstitial pneumonia (UIP). The unusual distribution of additional fibrotic areas in the mid to upper lungs is presumably reflective of underlying sarcoidosis as well. 3. Mild diffuse bronchial wall thickening with mild centrilobular and paraseptal emphysema. 4. Aortic atherosclerosis. 5. Dilatation of the pulmonic trunk (3.7 cm in diameter), concerning for pulmonary arterial hypertension. Aortic Atherosclerosis (ICD10-I70.0) and Emphysema (ICD10-J43.9). Electronically Signed: By: Trudie Reed M.D. On: 04/19/2017 21:27   Dg Chest Port 1 View  Result Date: 05/11/2017 CLINICAL DATA:  Right-sided pneumothorax. EXAM: PORTABLE CHEST 1 VIEW COMPARISON:  One-view chest x-ray 05/10/2017 FINDINGS: Heart is enlarged. Interstitial edema is slightly increased. Right pleural effusion is unchanged. A right-sided chest tube remains in place.  The side port is at the chest wall. A small apical pneumothorax is present. IMPRESSION: 1. Right-sided chest tube in place with small apical pneumothorax. 2. Persistent right pleural effusion. 3. Slight increase in interstitial edema. Electronically Signed   By: Marin Roberts M.D.   On: 05/11/2017 07:29   Dg Chest Port 1 View  Result Date: 05/10/2017 CLINICAL DATA:  Follow-up right pneumothorax with chest tube in place. EXAM: PORTABLE CHEST 1 VIEW COMPARISON:  05/09/2017, 05/07/2017 and earlier. FINDINGS: Right chest tube in place without visible pneumothorax. Pleural thickening and/or loculated fluid involving the lateral right pleural space. Chronic interstitial pulmonary fibrosis, unchanged. No new pulmonary parenchymal  abnormalities. Small amount of subcutaneous emphysema in the right lateral chest wall, unchanged. IMPRESSION: 1. Right chest tube in place with no visible pneumothorax. 2. Pleural thickening and/or loculated fluid involving the right lateral pleural space. 3. Chronic interstitial fibrosis. No new/acute pulmonary parenchymal abnormalities. Electronically Signed   By: Hulan Saas M.D.   On: 05/10/2017 07:23   Dg Chest Port 1 View  Result Date: 05/09/2017 CLINICAL DATA:  Pneumothorax, right chest tube EXAM: PORTABLE CHEST 1 VIEW COMPARISON:  05/07/2017 FINDINGS: Stable right chest tube position extending to the apex. Suspect similar small residual right apical pneumothorax without significant change. Extensive reticulonodular interstitial opacities and parenchymal scarring bilaterally compatible with history of sarcoid. Slightly improved lung volumes. Bronchial valves noted in the right hilum. No large effusion. Trachea is midline. Stable heart size and vascularity. IMPRESSION: Suspect stable right apical small pneumothorax. Stable chest tube position. Slight improvement in lung volumes with similar chronic lung disease. Electronically Signed   By: Judie Petit.  Shick M.D.   On: 05/09/2017  09:36   Dg Chest Port 1 View  Result Date: 05/07/2017 CLINICAL DATA:  Chest tube EXAM: PORTABLE CHEST 1 VIEW COMPARISON:  05/06/2017 FINDINGS: Right chest tube remains in place. Small residual right apical pneumothorax, less than 5%. Very low lung volumes with diffuse bilateral airspace disease. Lung volumes have decreased further since prior study. No definite effusions. IMPRESSION: Significant decrease in the size of the right pneumothorax, now less than 5%. New graft very low lung volumes with diffuse bilateral airspace disease. Electronically Signed   By: Charlett Nose M.D.   On: 05/07/2017 07:33   Dg Chest Port 1 View  Result Date: 05/06/2017 CLINICAL DATA:  Right chest tube positioning. EXAM: PORTABLE CHEST 1 VIEW COMPARISON:  Multiple prior chest x-rays, most recently from yesterday. FINDINGS: Right-sided chest tube in place with interval increase in size of the small right pneumothorax, now approximately 20%. The chest tube side port is now just inside the inner margin of the ribs. Unchanged low lung volumes with coarsened basilar predominant opacities. The enlarged cardiomediastinal silhouette is unchanged. IMPRESSION: 1. Interval increase in size of the small right pneumothorax, now approximately 20%. The chest tube side port projects just inside the inner margin of the ribs and could potentially be partially extrapleural. 2. Unchanged pulmonary fibrosis. These results will be called to the ordering clinician or representative by the Radiologist Assistant, and communication documented in the PACS or zVision Dashboard. Electronically Signed   By: Obie Dredge M.D.   On: 05/06/2017 08:23   Dg Chest Port 1 View  Result Date: 05/05/2017 CLINICAL DATA:  Right pneumothorax EXAM: PORTABLE CHEST 1 VIEW COMPARISON:  Two days ago FINDINGS: Right pneumothorax is decreased from prior, now less than 5%. Right-sided chest tube is somewhat shorter than before, but side-port still projecting in side the  pleural cavity. Low volume chest with coarse opacities mainly at the bases. Apparent cardiomegaly, accentuated by technique. Enlarged hila, reference chest CT 04/19/2017. IMPRESSION: 1. Decreased right apical pneumothorax, now less than 5%. The right-sided chest tube is slightly shorter than before. 2. Pulmonary fibrosis. Electronically Signed   By: Marnee Spring M.D.   On: 05/05/2017 07:48   Dg Chest Port 1 View  Result Date: 05/03/2017 CLINICAL DATA:  Followup right pneumothorax and atelectasis. Sarcoidosis. EXAM: PORTABLE CHEST 1 VIEW COMPARISON:  05/01/2017 FINDINGS: Right chest tube remains in place. Allowing for more lordotic positioning on current exam, a 20-25% right apical pneumothorax shows no significant change in size. Chronic interstitial  lung disease shows no significant interval change. No evidence of superimposed pulmonary consolidation. Heart size is normal. IMPRESSION: No significant change in right apical pneumothorax with chest tube remaining in place. Chronic interstitial lung disease. Electronically Signed   By: Myles Rosenthal M.D.   On: 05/03/2017 07:43   Dg Chest Port 1 View  Result Date: 05/01/2017 CLINICAL DATA:  Pneumothorax with chest tube present. EXAM: PORTABLE CHEST 1 VIEW COMPARISON:  April 30, 2017 FINDINGS: Right-sided chest tube position is unchanged. Pneumothorax in the right apex and lateral regions is stable in size and contour without tension component. There is extensive subcutaneous air on the right. Fibrosis in the mid and lower lung zones remain stable. There is a small right pleural effusion. There is no appreciable airspace consolidation. However, there is increased atelectasis in the left base compared to 1 day prior. Heart is mildly enlarged with pulmonary vascularity within normal limits. No adenopathy. No bone lesions. IMPRESSION: 1. Chest tube position on the right unchanged. No change and right apical and lateral pneumothorax. No tension component. Extensive  subcutaneous air on the right is stable. 2. Increase in left base atelectasis. Persistent fibrosis in the mid and lower lung zones. Small right pleural effusion. 3.  Stable cardiac silhouette. Electronically Signed   By: Bretta Bang III M.D.   On: 05/01/2017 07:19   Dg Chest Port 1 View  Result Date: 04/30/2017 CLINICAL DATA:  History of pneumothorax, right chest tube, followup EXAM: PORTABLE CHEST 1 VIEW COMPARISON:  Portable chest x-ray of 04/29/2017 FINDINGS: The volume of the right pneumothorax has increased slightly, with no change in position of the right chest tube. Bibasilar pulmonary fibrotic change is again noted. Subcutaneous emphysema has not changed significantly. Heart size is stable. IMPRESSION: 1. Slight increase in volume of the right pneumothorax with no change in right chest tube. 2. No change in subcutaneous emphysema. Electronically Signed   By: Dwyane Dee M.D.   On: 04/30/2017 10:20   Dg Chest Port 1 View  Result Date: 04/29/2017 CLINICAL DATA:  Chest tube in place. EXAM: PORTABLE CHEST 1 VIEW COMPARISON:  Radiograph of April 28, 2017. FINDINGS: Stable cardiomediastinal silhouette. Right-sided chest tube is unchanged position, with mild right apical pneumothorax noted. Stable subcutaneous emphysema is seen over both supraclavicular regions and right lateral chest wall. Stable coarse interstitial densities are noted throughout both lungs concerning for edema or chronic interstitial lung disease. IMPRESSION: Mildly decreased right apical pneumothorax is noted. Stable subcutaneous emphysema is noted. Right-sided chest tube is unchanged in position. Electronically Signed   By: Lupita Raider, M.D.   On: 04/29/2017 18:36   Dg Chest Port 1 View  Result Date: 04/28/2017 CLINICAL DATA:  Right-sided chest tube EXAM: PORTABLE CHEST 1 VIEW COMPARISON:  04/27/2017 FINDINGS: Cardiac shadow is stable. Persistent right apical pneumothorax is noted but slightly decreased when compared with  the prior exam. Right-sided chest tube remains in place. Increased interstitial markings are again noted and stable. No new focal abnormality is seen. IMPRESSION: Improving right pneumothorax.  The remainder of the exam is stable. Electronically Signed   By: Alcide Clever M.D.   On: 04/28/2017 07:45   Dg Chest Port 1 View  Result Date: 04/27/2017 CLINICAL DATA:  Chest pain and shortness of breath. Chest tube treatment for right-sided pneumothorax. EXAM: PORTABLE CHEST 1 VIEW COMPARISON:  Portable chest x-ray of April 23, 2017 and [place] and lateral chest x-ray of April 26, 2017 FINDINGS: The lungs are well-expanded. The interstitial markings remain  increased. There is a persistent right-sided pneumo pneumothorax with the pleural line noted along a portion of the course of the chest tube it does not appear to of increased in size since yesterday's study. There is no mediastinal shift. There is no significant pleural effusion. The heart is normal in size. There is right hilar prominence that is unchanged. There is extensive subcutaneous emphysema greatest on the right but also of the base of the left neck. IMPRESSION: Persistent right-sided pneumothorax amounting to approximately 30% of the lung volume. No mediastinal shift. The right chest tube tip remains in the pulmonary apex. Persistently increased interstitial markings likely reflecting known sarcoidosis. Electronically Signed   By: David  [place] M.D.   On: 04/27/2017 07:39   Dg Chest Port 1 View  Result Date: 04/25/2017 CLINICAL DATA:  Right-sided chest tube EXAM: PORTABLE CHEST 1 VIEW COMPARISON:  04/24/2017 FINDINGS: Bilateral diffuse interstitial thickening. Right-sided chest tube directed towards the apex. Right apical pneumothorax measuring 2 cm at the apex similar in appearance to the prior exam. No left pneumothorax. No pleural effusion. Stable cardiomediastinal silhouette. Extensive soft tissue emphysema along the right lateral chest wall and the  right and left side of the neck. IMPRESSION: 1. Right-sided chest tube directed towards the apex. Right apical pneumothorax measuring 2 cm at the apex similar in appearance to the prior exam. 2. Chronic interstitial lung disease. Electronically Signed   By: Elige Ko   On: 04/25/2017 08:14   Dg Chest Port 1 View  Result Date: 04/24/2017 CLINICAL DATA:  Follow-up chest tube EXAM: PORTABLE CHEST 1 VIEW COMPARISON:  04/23/2017 FINDINGS: Right thoracostomy catheter is again identified and stable. The tiny apical pneumothorax is again noted. Diffuse fibrotic changes are noted bilaterally. No new focal infiltrate is seen. Significant subcutaneous emphysema is noted. No bony abnormality is noted. IMPRESSION: Stable right apical pneumothorax. Chronic fibrotic changes bilaterally. Electronically Signed   By: Alcide Clever M.D.   On: 04/24/2017 07:16   Dg Chest Portable 1 View  Result Date: 04/23/2017 CLINICAL DATA:  Chest tube insertion EXAM: PORTABLE CHEST 1 VIEW COMPARISON:  Study obtained earlier in the day FINDINGS: Right-sided pigtail catheter has been removed with a new chest tube present on the right, directed medially. Pneumothorax on the right is smaller with a small apical pneumothorax on the right remaining. There is extensive soft tissue air on the right. There is underlying interstitial fibrosis with superimposed interstitial edema and small pleural effusions. Heart is prominent in size with mild pulmonary venous hypertension. No adenopathy is evident. No bone lesions. IMPRESSION: Pneumothorax smaller after chest tube replacement. No tension component. Extensive subcutaneous air remains the right. Findings are present felt to be indicative of a degree of congestive heart failure superimposed on underlying pulmonary fibrosis. Stable cardiac prominence. Electronically Signed   By: Bretta Bang III M.D.   On: 04/23/2017 13:46   Dg Chest Port 1 View  Result Date: 04/23/2017 CLINICAL DATA:   Right-sided chest tube.  Shortness of breath. EXAM: PORTABLE CHEST 1 VIEW COMPARISON:  04/22/2017.  06/27/2016.  04/04/2015.  CT 04/19/2017. FINDINGS: Right chest tube noted in stable position. Small right apical pneumothorax again noted without interim change. Stable cardiomegaly and prominent central pulmonary artery. Diffuse bilateral pulmonary interstitial prominence again noted consistent with chronic interstitial lung disease without interim change. Bronchiectasis again noted without interim change. Right chest wall subcutaneous emphysema again noted. Surgical clips right shoulder. IMPRESSION: 1. Right chest tube in stable position. Stable small right apical pneumothorax again noted.  Stable diffuse right chest wall subcutaneous emphysema. 2. Chronic interstitial lung disease and bronchiectasis again noted without interim change. 3. Stable cardiomegaly and prominent central pulmonary arteries suggesting pulmonary hypertension. Electronically Signed   By: Maisie Fus  Register   On: 04/23/2017 06:53   Dg Chest Port 1 View  Result Date: 04/22/2017 CLINICAL DATA:  Chest tube.  Pneumothorax. EXAM: PORTABLE CHEST 1 VIEW COMPARISON:  Radiograph of April 21, 2017. FINDINGS: Stable cardiomediastinal silhouette. Stable mild right apical pneumothorax is noted. Stable large amount of subcutaneous emphysema is seen over right lateral chest wall. Stable position of right-sided pigtail drainage catheter. Stable interstitial densities are noted throughout both lungs, most prominently seen in the lung bases, consistent chronic interstitial lung disease. IMPRESSION: Stable position of right-sided pigtail drainage catheter. Stable mild right apical pneumothorax is noted. Stable subcutaneous emphysema is seen overlying right lateral chest wall. Electronically Signed   By: Lupita Raider, M.D.   On: 04/22/2017 12:05   Dg Chest Port 1 View  Result Date: 04/21/2017 CLINICAL DATA:  Right pneumothorax. EXAM: PORTABLE CHEST 1 VIEW  COMPARISON:  Radiographs dated 04/20/2017 and 04/19/2017 and CT scan dated 04/19/2017 FINDINGS: Right chest tube remains unchanged in position. Right subcutaneous emphysema has slightly increased since the prior study. Right apical pneumothorax has diminished slightly in size. Blebs are seen at the right lung apex. The patient has severe chronic interstitial lung disease with bronchiectasis, as demonstrated on the prior CT scan. No acute abnormality in the left lung. Improved aeration at the the lung bases. Overall heart size and vascularity are normal. IMPRESSION: 1. Slight decrease in the right apical pneumothorax. 2. Increased subcutaneous emphysema on the right. 3. No change in the position of the right-sided pigtail chest tube. 4. Improved aeration of both lung bases. Electronically Signed   By: Francene Boyers M.D.   On: 04/21/2017 08:50   Dg Chest Port 1 View  Result Date: 04/20/2017 CLINICAL DATA:  Sarcoidosis, asthma, chronic respiratory failure with hypoxia, right-sided pneumothorax with chest tube treatment. EXAM: PORTABLE CHEST 1 VIEW COMPARISON:  Chest x-ray as well as chest CT scan of April 19, 2017 FINDINGS: There remains an approximately 20% right-sided pneumothorax. The small caliber chest tube is in stable position projecting over the lateral aspect of the mid ribcage. There is considerable subcutaneous emphysema on the right which is stable. There is no significant mediastinal shift. The interstitial markings of both lungs remain increased. The heart is top-normal in size. There is a hiatal hernia. IMPRESSION: Fairly stable appearance of the approximately 20% right-sided pneumothorax. The small caliber right-sided chest tube is in stable position but lies inferior to the pneumothorax. Stable subcutaneous emphysema on the right. Stable chronic interstitial changes bilaterally most compatible with the history of reactive airway disease and/or sarcoidosis. Electronically Signed   By: David   [place] M.D.   On: 04/20/2017 07:39   Dg Chest Port 1 View  Result Date: 04/19/2017 CLINICAL DATA:  RIGHT-sided pneumothorax, chest tube, history sarcoidosis EXAM: PORTABLE CHEST 1 VIEW COMPARISON:  Portable exam 1403 hours compared to 04/18/2017 FINDINGS: Pigtail RIGHT thoracostomy tube again noted. Slight increase in size of RIGHT pneumothorax. No mediastinal shift. Diffuse pulmonary infiltrates with a predominance in the mid to lower lungs question pulmonary edema superimposed upon a background of pulmonary fibrosis, increased versus 06/27/2016. No gross pleural effusion. Bones demineralized. IMPRESSION: Slight increase in size of RIGHT pneumothorax despite thoracostomy tube. Pulmonary fibrosis with suspect mild superimposed pulmonary edema. Electronically Signed   By: Angelyn Punt.D.  On: 04/19/2017 15:24   Dg Chest Port 1 View  Result Date: 04/19/2017 CLINICAL DATA:  Right-sided chest tube repositioning. Initial encounter. EXAM: PORTABLE CHEST 1 VIEW COMPARISON:  Chest radiograph performed earlier today at 10:22 p.m. FINDINGS: There is a persistent small right apical pneumothorax, stable from the prior study. The right-sided chest tube has been advanced. Patchy bibasilar airspace opacities raise concern for pulmonary edema. Mild vascular congestion is noted. No definite pleural effusion is seen. The cardiomediastinal silhouette is borderline normal in size. No acute osseous abnormalities are identified. Soft tissue air is noted along the right chest wall. IMPRESSION: 1. Persistent small right apical pneumothorax is stable from the recent prior study. Right-sided chest tube has been advanced. 2. Patchy bilateral airspace opacities raise concern for pulmonary edema. Mild vascular congestion noted. Electronically Signed   By: Roanna Raider M.D.   On: 04/19/2017 00:24   Dg Chest Port 1 View  Result Date: 04/18/2017 CLINICAL DATA:  66 y/o  F; pneumothorax.  History of sarcoidosis. EXAM: PORTABLE CHEST  1 VIEW COMPARISON:  04/18/2017 chest radiograph FINDINGS: Stable right-sided pneumothorax. No leftward mediastinal shift. Stable position of right-sided pigtail catheter partially in right chest wall and partially pleural space. Interval increase in pneumatosis within the right chest wall. Stable diffuse coarse reticular opacities of the lungs. Stable cardiac silhouette. IMPRESSION: 1. Stable right pneumothorax. Stable position of right pigtail catheter partially in the right chest wall and partially in pleural space. Interval increase in right chest wall pneumatosis. 2. Stable coarse reticular opacities compatible with chronic interstitial lung disease. Electronically Signed   By: Mitzi Hansen M.D.   On: 04/18/2017 22:41   Dg Chest Port 1 View  Result Date: 04/18/2017 CLINICAL DATA:  Status post chest tube placement. History of sarcoidosis. EXAM: PORTABLE CHEST 1 VIEW COMPARISON:  Earlier today. FINDINGS: Interval right lateral pigtail chest catheter with some side holes projected within the lateral right hemithorax and some projected outside of the hemithorax. Significant decrease in size of the previously demonstrated large right pneumothorax. The residual pneumothorax occupies approximately 10% of the volume of the right hemithorax. No significant change in diffuse prominence of the interstitial markings. Normal sized heart. Unremarkable bones. IMPRESSION: 1. Interval right lateral pigtail chest catheter with some side holes in the hemithorax and some side holes outside of the hemithorax. 2. Significant decrease in size of the right pneumothorax with a residual approximately 10% pneumothorax. 3. Stable chronic interstitial lung disease compatible with the history of sarcoidosis. Electronically Signed   By: Beckie Salts M.D.   On: 04/18/2017 19:01   Dg Chest Port 1 View  Result Date: 04/18/2017 CLINICAL DATA:  Acute shortness of breath and diminished breath sounds on RIGHT, history  sarcoidosis, GERD, asthma EXAM: PORTABLE CHEST 1 VIEW COMPARISON:  Portable exam 1735 hours compared to 06/27/2016 FINDINGS: Normal heart size and mediastinal contours. Large RIGHT pneumothorax with complete collapse of the RIGHT lung. Underlying chronic interstitial lung disease/pulmonary fibrosis. No mediastinal shift. Bones demineralized. IMPRESSION: Large RIGHT pneumothorax with complete collapse of the RIGHT lung without evidence of mediastinal shift/tension. Underlying chronic interstitial lung disease/ pulmonary fibrosis. Critical Value/emergent results were called by telephone at the time of interpretation on 04/18/2017 at 5:52 pm to [facility], who verbally acknowledged these results. Electronically Signed   By: Ulyses Southward M.D.   On: 04/18/2017 17:53       Subjective: No chest pain or sob.   Discharge Exam: Vitals:   05/12/17 0813 05/12/17 0814  BP:  Pulse:    Resp:    Temp:    SpO2: 98% 95%   Vitals:   05/11/17 2215 05/12/17 0325 05/12/17 0813 05/12/17 0814  BP: 115/73 97/71    Pulse:      Resp: (!) 23 (!) 22    Temp: 99.7 F (37.6 C) 98.7 F (37.1 C)    TempSrc: Oral Oral    SpO2: 99% 94% 98% 95%  Weight:  94.2 kg (207 lb 9.6 oz)    Height:  5\' 5"  (1.651 m)      General: Pt is alert, awake, not in acute distress Cardiovascular: RRR, S1/S2 +, no rubs, no gallops Respiratory: CTA bilaterally, no wheezing, no rhonchi Abdominal: Soft, NT, ND, bowel sounds + Extremities: no edema, no cyanosis    The results of significant diagnostics from this hospitalization (including imaging, microbiology, ancillary and laboratory) are listed below for reference.     Microbiology: Recent Results (from the past 240 hour(s))  Culture, blood (Routine X 2) w Reflex to ID Panel     Status: None   Collection Time: 05/05/17 11:30 PM  Result Value Ref Range Status   Specimen Description BLOOD RIGHT ARM  Final   Special Requests   Final    BOTTLES DRAWN AEROBIC AND ANAEROBIC  Blood Culture adequate volume   Culture NO GROWTH 5 DAYS  Final   Report Status 05/11/2017 FINAL  Final  Culture, blood (Routine X 2) w Reflex to ID Panel     Status: None   Collection Time: 05/05/17 11:39 PM  Result Value Ref Range Status   Specimen Description BLOOD LEFT HAND  Final   Special Requests IN PEDIATRIC BOTTLE Blood Culture adequate volume  Final   Culture NO GROWTH 5 DAYS  Final   Report Status 05/11/2017 FINAL  Final     Labs: BNP (last 3 results)  Recent Labs  06/05/16 1520  BNP 95.0   Basic Metabolic Panel:  Recent Labs Lab 05/05/17 2330 05/10/17 0948  NA 133* 131*  K 4.3 3.5  CL 97* 95*  CO2 28 26  GLUCOSE 100* 153*  BUN 7 5*  CREATININE 1.04* 1.05*  CALCIUM 8.7* 8.6*   Liver Function Tests: No results for input(s): AST, ALT, ALKPHOS, BILITOT, PROT, ALBUMIN in the last 168 hours. No results for input(s): LIPASE, AMYLASE in the last 168 hours. No results for input(s): AMMONIA in the last 168 hours. CBC:  Recent Labs Lab 05/05/17 2330 05/10/17 0948  WBC 10.0 8.7  NEUTROABS 6.2  --   HGB 11.3* 11.1*  HCT 35.6* 35.2*  MCV 92.7 91.9  PLT 286 326   Cardiac Enzymes: No results for input(s): CKTOTAL, CKMB, CKMBINDEX, TROPONINI in the last 168 hours. BNP: Invalid input(s): POCBNP CBG: No results for input(s): GLUCAP in the last 168 hours. D-Dimer No results for input(s): DDIMER in the last 72 hours. Hgb A1c No results for input(s): HGBA1C in the last 72 hours. Lipid Profile No results for input(s): CHOL, HDL, LDLCALC, TRIG, CHOLHDL, LDLDIRECT in the last 72 hours. Thyroid function studies No results for input(s): TSH, T4TOTAL, T3FREE, THYROIDAB in the last 72 hours.  Invalid input(s): FREET3 Anemia work up No results for input(s): VITAMINB12, FOLATE, FERRITIN, TIBC, IRON, RETICCTPCT in the last 72 hours. Urinalysis    Component Value Date/Time   COLORURINE AMBER (A) 05/10/2017 1600   APPEARANCEUR HAZY (A) 05/10/2017 1600   LABSPEC  1.029 05/10/2017 1600   PHURINE 5.0 05/10/2017 1600   GLUCOSEU NEGATIVE 05/10/2017 1600  HGBUR SMALL (A) 05/10/2017 1600   BILIRUBINUR SMALL (A) 05/10/2017 1600   BILIRUBINUR Negaitve 03/20/2016 1158   KETONESUR NEGATIVE 05/10/2017 1600   PROTEINUR 100 (A) 05/10/2017 1600   UROBILINOGEN negative 03/20/2016 1158   NITRITE NEGATIVE 05/10/2017 1600   LEUKOCYTESUR NEGATIVE 05/10/2017 1600   Sepsis Labs Invalid input(s): PROCALCITONIN,  WBC,  LACTICIDVEN Microbiology Recent Results (from the past 240 hour(s))  Culture, blood (Routine X 2) w Reflex to ID Panel     Status: None   Collection Time: 05/05/17 11:30 PM  Result Value Ref Range Status   Specimen Description BLOOD RIGHT ARM  Final   Special Requests   Final    BOTTLES DRAWN AEROBIC AND ANAEROBIC Blood Culture adequate volume   Culture NO GROWTH 5 DAYS  Final   Report Status 05/11/2017 FINAL  Final  Culture, blood (Routine X 2) w Reflex to ID Panel     Status: None   Collection Time: 05/05/17 11:39 PM  Result Value Ref Range Status   Specimen Description BLOOD LEFT HAND  Final   Special Requests IN PEDIATRIC BOTTLE Blood Culture adequate volume  Final   Culture NO GROWTH 5 DAYS  Final   Report Status 05/11/2017 FINAL  Final     Time coordinating discharge: Over 30 minutes  SIGNED:   Kathlen ModyAKULA,Austin Pongratz, MD  [facility] 05/12/2017, 3:11 PM Pager   If 7PM-7AM, please contact night-coverage [email or web address] Password [email or web address]

## 2017-05-13 ENCOUNTER — Telehealth: Payer: Self-pay | Admitting: Behavioral Health

## 2017-05-13 NOTE — Telephone Encounter (Signed)
Unable to reach patient for TCM/Hospital Follow-up call. Left message for patient to return call when available.    

## 2017-05-13 NOTE — Telephone Encounter (Signed)
Pt returned call

## 2017-05-14 ENCOUNTER — Telehealth: Payer: Self-pay | Admitting: Pulmonary Disease

## 2017-05-14 ENCOUNTER — Telehealth: Payer: Self-pay

## 2017-05-14 NOTE — Telephone Encounter (Signed)
Called patient, left message for return call regarding Hospital follow up appointment.

## 2017-05-14 NOTE — Telephone Encounter (Signed)
Called and spoke with pt. Pt states she was recently discharged on 05/12/17. Pt is requesting HFU with RA at the [facility]. First availabke with RA is 07/09/17. Pt states she would prefer not to see a NP. Pt states if she can get an apt in the next month at the [facility] she will be able to make that, with any provider as her husband is off work.  Pt has been scheduled with MW on 05/21/17 @ 3:30. Nothing further needed.

## 2017-05-14 NOTE — Telephone Encounter (Signed)
05/14/17   Transition Care Management Follow-up Telephone Call  ADMISSION DATE: 04/18/2017  DISCHARGE DATE: 05/12/2017  How have you been since you were released from the hospital? Patient has had some weakness  Do you understand why you were in the hospital? Yes Right lung collapsed   Do you understand the discharge instrcutions? Yes per patient.    Items Reviewed:  Medications reviewed: Yes   Allergies reviewed:Yes   Dietary changes reviewed: Low sodium heart healthy   Referrals reviewed:Yes appointment scheduled with PCP [facility]   Functional Questionnaire:   Activities of Daily Living (ADLs): Patient states she is able to perform ADL's  but does have help from her husband.    Any patient concerns? Would like to know why she is on a Heart Healthy Diet if she does not have heart problems.    Confirmed importance and date/time of follow-up visits scheduled:Yes Confirmed with patient if condition begins to worsen call PCP or go to the ER. Yes   Patient was given the office number and encouragred to call back with questions or concerns.Yes

## 2017-05-15 NOTE — Telephone Encounter (Signed)
Patient has scheduled Hospital Follow-up appointment for 05/25/17 at 12:30 PM w/ Dr. Patsy [place].

## 2017-05-17 ENCOUNTER — Other Ambulatory Visit: Payer: Self-pay | Admitting: Gastroenterology

## 2017-05-21 ENCOUNTER — Emergency Department (HOSPITAL_COMMUNITY): Payer: Medicare Other

## 2017-05-21 ENCOUNTER — Encounter: Payer: Self-pay | Admitting: Internal Medicine

## 2017-05-21 ENCOUNTER — Encounter (HOSPITAL_COMMUNITY): Payer: Self-pay

## 2017-05-21 ENCOUNTER — Ambulatory Visit (INDEPENDENT_AMBULATORY_CARE_PROVIDER_SITE_OTHER): Payer: Medicare Other | Admitting: Internal Medicine

## 2017-05-21 ENCOUNTER — Emergency Department (HOSPITAL_COMMUNITY)
Admission: EM | Admit: 2017-05-21 | Discharge: 2017-05-21 | Disposition: A | Discharge status: Home / Self Care | Payer: Medicare Other | Attending: Emergency Medicine | Admitting: Emergency Medicine

## 2017-05-21 VITALS — BP 110/78 | HR 133 | Ht 65.0 in | Wt 204.0 lb

## 2017-05-21 DIAGNOSIS — R531 Weakness: Secondary | ICD-10-CM | POA: Diagnosis not present

## 2017-05-21 DIAGNOSIS — Z880 Allergy status to penicillin: Secondary | ICD-10-CM | POA: Insufficient documentation

## 2017-05-21 DIAGNOSIS — R0602 Shortness of breath: Secondary | ICD-10-CM | POA: Insufficient documentation

## 2017-05-21 DIAGNOSIS — Z87891 Personal history of nicotine dependence: Secondary | ICD-10-CM | POA: Diagnosis not present

## 2017-05-21 DIAGNOSIS — J9383 Other pneumothorax: Secondary | ICD-10-CM | POA: Diagnosis not present

## 2017-05-21 DIAGNOSIS — J9611 Chronic respiratory failure with hypoxia: Secondary | ICD-10-CM

## 2017-05-21 DIAGNOSIS — R05 Cough: Secondary | ICD-10-CM

## 2017-05-21 DIAGNOSIS — D869 Sarcoidosis, unspecified: Secondary | ICD-10-CM

## 2017-05-21 DIAGNOSIS — J45909 Unspecified asthma, uncomplicated: Secondary | ICD-10-CM | POA: Insufficient documentation

## 2017-05-21 DIAGNOSIS — I4891 Unspecified atrial fibrillation: Secondary | ICD-10-CM | POA: Diagnosis present

## 2017-05-21 DIAGNOSIS — Z79899 Other long term (current) drug therapy: Secondary | ICD-10-CM | POA: Diagnosis not present

## 2017-05-21 DIAGNOSIS — R058 Other specified cough: Secondary | ICD-10-CM

## 2017-05-21 LAB — CBC
HEMATOCRIT: 43.3 % (ref 36.0–46.0)
HEMOGLOBIN: 13.7 g/dL (ref 12.0–15.0)
MCH: 29.8 pg (ref 26.0–34.0)
MCHC: 31.6 g/dL (ref 30.0–36.0)
MCV: 94.1 fL (ref 78.0–100.0)
Platelets: 368 10*3/uL (ref 150–400)
RBC: 4.6 MIL/uL (ref 3.87–5.11)
RDW: 14 % (ref 11.5–15.5)
WBC: 7.8 10*3/uL (ref 4.0–10.5)

## 2017-05-21 LAB — BASIC METABOLIC PANEL
ANION GAP: 7 (ref 5–15)
BUN: 9 mg/dL (ref 6–20)
CO2: 29 mmol/L (ref 22–32)
CREATININE: 0.95 mg/dL (ref 0.44–1.00)
Calcium: 10 mg/dL (ref 8.9–10.3)
Chloride: 102 mmol/L (ref 101–111)
GFR calc Af Amer: >60 mL/min (ref >60)
GFR calc non Af Amer: >60 mL/min (ref >60)
Glucose, Bld: 104 mg/dL — ABNORMAL HIGH (ref 65–99)
POTASSIUM: 3.9 mmol/L (ref 3.5–5.1)
Sodium: 138 mmol/L (ref 135–145)

## 2017-05-21 LAB — TROPONIN I

## 2017-05-21 MED ORDER — BUDESONIDE-FORMOTEROL FUMARATE 160-4.5 MCG/ACT IN AERO: 2.0000 | INHALATION_SPRAY | Freq: Two times a day (BID) | RESPIRATORY_TRACT | 0 refills | Status: DC

## 2017-05-21 MED ORDER — BUDESONIDE-FORMOTEROL FUMARATE 160-4.5 MCG/ACT IN AERO: 2.0000 | INHALATION_SPRAY | Freq: Two times a day (BID) | RESPIRATORY_TRACT | 11 refills | Status: DC

## 2017-05-21 MED FILL — White Petrolatum-Mineral Oil Ophth Ointment: OPHTHALMIC | Qty: 3.5 | Status: AC

## 2017-05-21 NOTE — Progress Notes (Signed)
Subjective:    Patient ID: [name], female    DOB: Mar 13, 1951 MRN: [id number]    Brief patient profile:  [id number] yobf quit smoking in 1980 and dx with sarcoidosis in 1996 but free of evidence of active sarcoid off steroids when last seen in pulmonary clinic in 04/2010 with predominantly upper airway cough    07/11/2013 ov/ Lamarr Feenstra  Chief Complaint  Patient presents with  . Pulmonary Consult    Pt last seen here in 2009- referred back per Dr. Frederik PearHopper's request. Pt c/o cough for 2 yrs and SOB on and off for the past 6 months.  Cough is occ prod with very minimal yellow sputum.  She states that she gets out of breath walking minimal distances such as from room to room at home and from parking lot to building today.   always better p prednisone but 2 weeks later  symptoms resume, last time was Feb 2014 and gradual onset progressive symptoms since then of  Cough = sob, better p saba,  Cough seems worse p supper, does not typically wake her up.  Only using rescue once or twice weekly and says symbicort works when she takes it.  Has h/o GERD / Barrett's denies being told to stay on ppi rec Prednisone 10 mg take  4 each am x 2 days,   2 each am x 2 days,  1 each am x 2 days and stop  Symbicort 160 Take 2 puffs first thing in am and then another 2 puffs about 12 hours later.  Work on inhaler technique:   Only use your albuterol (ventolin) as rescue GERD   Please remember to go to the  x-ray department downstairs for your tests - we will call you with the results when they are available. Please schedule a follow up office visit in 6 weeks, call sooner if needed with pfts  add :  Need to sort out ? Of Barrett's and whether she needs lifetime ppi/ gi     Admit date: 03/07/2014  Discharge date: 03/09/2014  Discharge Diagnoses:  Acute on chronic hypoxemic respiratory failure  Baseline stage 4 sarcoid  Chronic cough  GERD  DISCHARGE PLAN BY DIAGNOSIS  Acute on chronic hypoxemic respiratory failure   Baseline stage 4 sarcoid  Chronic cough  Discharge Plan:  - Supplemental home O2.  - Prednisone taper.  - Continue 7 days of doxycycline.  - Tramadol 50mg  q6h.  - Respiratory virus panel results pending.  - F/u appt with Pulmonary as scheduled below.  GERD  Discharge Plan:  - Continue PPI (over the counter).  DISCHARGE SUMMARY  Kiara [name] is a 66 y.o. y/o AA female with a PMH of Pulmonary Sarcoidosis, Asthma, GERD, Barrett's, Hiatal hernia, Migraines, IBS, Anxiety. She was seen by her PCP [name] on 6/30 for cough, fever, tachycardia, SOB x 10 days. In office, SpO2 82%. PCP sent her to ED for further evaluation.  In ED, CT angio was obtained and was negative for PE and demonstrated findings c/w sarcoidosis. She was treated with nebulizers, put on supplemental O2, and [facility]  She was started on IV steroids and given empiric abx (doxycycline).  On 7/2, her symptoms were markedly improved and she was deemed safe for discharge home. She is scheduled for a follow up outpatient visit with pulmonary as noted below.  SIGNIFICANT DIAGNOSTIC STUDIES / EVENTS  6/30 admit  6/30 CTA >>> neg for PE , cystic lucencies/fibrosis in a perilymphatic/peribronchovascular distribution with subpleural reticulation/fibrosis,  related to patient's known sarcoidosis.  MICRO DATA  Blood 6/30 >>> ngtd  Urine 6/30 >>> ? contaminant  Autoimmune Panel 6/30 >>> RF high, ANA neg, CCP and HP neg ANTIBIOTICS  Doxycycline 6/30 >>> (will be discharged with 7 days outpatient therapy).      05/16/2014 f/u ov/Miata Culbreth re: burned out sarcoid/ 02 dep resp failure 24/7 02  Chief Complaint  Patient presents with  . Followup with PFT    Pt states that hoarseness is slightly better. She c/o minimal swelling in her rt foot.    not really limited from desired acitvities on 02 but notes desats with exertion on 2lpm rec No change rx Pt stopped prednisone ? Date   06/28/2014 f/u ov/[name] re: 02 dep resp failure/  sarcoid off pred since ? But no worse off  Chief Complaint  Patient presents with  . Follow-up    Breathing is unchaged since the last visit.  No new co's today. She has used rescue inhaler x 2 this past wk.   doing well with rehab though needing more 02 with the heaviest of exertion than the 2lpm we prev recommended.  No more sob on 02 at baseline activity, though. >DME order for new portable O2 .   07/13/2014 Follow and Med Review 02 dep resp failure/ sarcoid  Patient returns for a two-week follow-up and medication review. Says overall she is doing okay with her breathing .  Reviewed all her medications organize them into a medication calendar. Patient education and thought appears that she is taking her medications correctly. Says she is doing well with pulmonary rehab, really loves the program.  Is being evaluated by DME for new portable O2 device.  She denies any chest pain, orthopnea, PND, or increased leg swelling rec No change rx/ follow med calendar    09/20/2014 acute ov/[name] re: ?uri/ ? asthma exac/not following med calendar action plans  "I have my meds on my cell phone" Chief Complaint  Patient presents with  . Follow-up    pt c/o sob with lots of exertion, prod cough X1 week with green mucus.  is requesting abx and prednisone. Needs nasula cannula with "mustache" according to pulmonary rehab.   prior to acute onset cough was not using any inhalers at all and was supposed to start back on symbicort 160 immediately for any flare as per action plan but hasn't started as yet despite back on ventolin with severe sob over baseline mostly just while coughing but ok with adls and even still doing rehab despite flare of cough and sob   rec zpak Prednisone 10 mg take  4 each am x 2 days,   2 each am x 2 days,  1 each am x 2 days and stop     04/04/2015 f/u ov/[name] re: sarcoid/ 02 dep 2lpm x 24 h per day and prn prednisone  Chief Complaint  Patient presents with  . Follow-up    Pt  needing pulmonary clearance for colonoscopy. She states her breathing is doing well and denies any new co's today. She is using Symbicort 2 x per wk on average.        Admit date: 04/18/2017 Discharge date: 05/12/2017  Admitted From: Home.  Disposition: HOme.  Recommendations for Outpatient Follow-up:  1. Follow up with PCP in 1-2 weeks 2. Please obtain BMP/CBC in one week 3. Please follow up with cardiothoracic surgery as recommended.  4. You will need a repeat CXR in before your visit.  Discharge Condition: stable.  CODE STATUS: full code.  Diet recommendation: Heart Healthy / Brief/Interim Summary: Markelle Asaro Hyltonis a 66 y.o.femalewith medical history significant of pulmonary sarcoidosis, follows up with [name] presents with sudden onset of chest pain or sob. She was found to have large right pneumothorax with complete collapse. Cardiothoracic surgery consulted and she underwent chest tube placement on the rigth on 8/16, and s/p talk pleurodesis. Over the last one week, she has air leek and repeat CXR this am shows increase in the pneumothorax to 20%. Another issue is her pain at the site of the chest tube, which is better today. She underwent IBV via video bronchoscopy on 9/1 Chest tube was removed on 9/3. She tolerated and repeat CXR does not show any pneumothorax.    Discharge Diagnoses:  Principal Problem:   Pneumothorax on right Active Problems:   PULMONARY SARCOIDOSIS   Mild persistent asthma in adult without complication with component of vcd   Chronic respiratory failure with hypoxia (HCC)   Spontaneous pneumothorax   Other chest pain   Goals of care, counseling/discussion   Palliative care by specialist  Right sided pneumothorax:  Resolved.  Cardiothoracic surgery following, she underwent IBV placement via video bronchoscopy. No more leak, repeat CXR does not show any pneumothorax. underwentwater seal of the chest tube on 9/1.  Chest tube removed on  9/3. She tolerated it without any complications.  Repeat CXR does not show any new pneumothorax.  She was discharged to be followed by cardiothoracic surgery in one week at the office with a repeat CXR.     Pulmonary sarcoidosis:  H/o chronic respiratory failure with hypoxia on 2l it of [place] oxygen at home.  Follows up with [name], no exacerbation this admission.     Constipation:  From pain meds.  Resume miralax, senna colace and dulcolax.  Improved.     Fever: none overnight.  No new pneumonia on cxr. Wbc normal. UA is negative for infection.  Lactic acid is 0.7.  No leukocytosis.  Blood cultures ordered and pending, neg so far. Pro calcitonin is 0.45   Mild normocytic anemia:  Possibly from anemia of chronic disease. Hemoglobin stable around 11.     05/21/2017  Extended f/u ov/Lurae Hornbrook re: post hosp/ transition of care / 02 dep 2lpm 24/7  Chief Complaint  Patient presents with  . Hospitalization Follow-up    She c/o having no energy. She states that she gets winded just walking a few steps.   50 ft sob  On symbicort  160  No longer on symbicort 160  2 bid consistently "doesn't help breathing or cough" nor does ventolin though hfa quite poor  Says coughing for "years now 24/7"  dry quality/ only response is to tussionex   No obvious day to day or daytime variability or assoc excess/ purulent sputum or mucus plugs or hemoptysis or cp or chest tightness, subjective wheeze or overt sinus or hb symptoms. No unusual exp hx or h/o childhood pna/ asthma or knowledge of premature birth.   Also denies any obvious fluctuation of symptoms with weather or environmental changes or other aggravating or alleviating factors except as outlined above   Current Allergies, Complete Past Medical History, Past Surgical History, Family History, and Social History were reviewed in [employer] record.  ROS  The following are not active complaints unless  bolded sore throat, dysphagia, dental problems, itching, sneezing,  nasal congestion or disharge of excess mucus or purulent secretions, ear ache,  fever, chills, sweats, unintended wt loss or wt gain, classically pleuritic or exertional cp,  orthopnea pnd or leg swelling, presyncope, palpitations, abdominal pain, anorexia, nausea, vomiting, diarrhea  or change in bowel habits or bladder habits, change in stools or change in urine, dysuria, hematuria,  rash, arthralgias, visual complaints, headache, numbness, weakness or ataxia or problems with walking or coordination,  change in mood/affect or memory.        Current Meds  Medication Sig  . albuterol (VENTOLIN HFA) 108 (90 Base) MCG/ACT inhaler Inhale 2 puffs into the lungs as needed. (Patient taking differently: Inhale 2 puffs into the lungs every 4 (four) hours as needed for wheezing or shortness of breath. )  . ALOE VERA JUICE PO Take 120 mLs by mouth daily.   . Ascorbic Acid (VITAMIN C PO) Take 1 tablet by mouth daily.  . ASTRAGALUS PO Take 1 tablet by mouth daily.   Marland Kitchen buPROPion (WELLBUTRIN XL) 300 MG 24 hr tablet Take 1 tablet (300 mg total) by mouth daily. (Patient taking differently: Take 300 mg by mouth daily before breakfast. )  . Calcium Carbonate-Vitamin D (CALCIUM-D PO) Take 1 tablet by mouth daily.  . chlorpheniramine-HYDROcodone (TUSSIONEX) 10-8 MG/5ML SUER Take 5 mLs by mouth every 12 (twelve) hours.  . Collagen Hydrolysate POWD Take 1 scoop by mouth daily with supper.   . Cyanocobalamin (VITAMIN B-12) 5000 MCG TBDP Take 5,000 mcg by mouth daily.  . cycloSPORINE (RESTASIS) 0.05 % ophthalmic emulsion Place 1 drop into both eyes 2 (two) times daily.   . fluticasone (FLONASE) 50 MCG/ACT nasal spray Place 1 spray into both nostrils 3 (three) times daily.  . furosemide (LASIX) 20 MG tablet TAKE 1 TABLET (20 MG TOTAL) BY MOUTH DAILY. USE AS NEEDED FOR SWELLING (Patient taking differently: Take 20 mg by mouth daily as needed (swelling). )   . ipratropium (ATROVENT HFA) 17 MCG/ACT inhaler Inhale 2 puffs into the lungs every 6 (six) hours as needed for wheezing.  Marland Kitchen loratadine (CLARITIN) 10 MG tablet Take 10 mg by mouth daily.  Marland Kitchen LORazepam (ATIVAN) 0.5 MG tablet TAKE 1/2 TO 1 TABLET BY MOUTH EVERY 8 TO 12 HRS ONLY AS NEEDED (Patient taking differently: TAKE 1/2 TO 1 TABLET (0.25 - 0.5 MG) BY MOUTH EVERY 8 TO 12 HRS ONLY AS NEEDED FOR PANIC ATTACKS)  . Multiple Vitamin (MULTIVITAMIN WITH MINERALS) TABS tablet Take 1 tablet by mouth daily.  . Nasal Moisturizer Combination (RHINASE) SOLN Place 2 sprays into the nose 2 (two) times daily as needed (dry, bloody nostrils).   . OXYGEN Inhale 2 L into the lungs continuous.  . pantoprazole (PROTONIX) 20 MG tablet TAKE 1 TABLET (20 MG TOTAL) BY MOUTH DAILY.  Marland Kitchen PREBIOTIC PRODUCT PO Take 1 scoop by mouth daily before breakfast.   . ranitidine (ZANTAC) 150 MG tablet TAKE 1 TABLET (150 MG TOTAL) BY MOUTH 2 (TWO) TIMES DAILY.  . SYMBICORT 160-4.5 MCG/ACT inhaler INHALE 2 PUFF INTO THE LUNGS TWICE A DAY AS NEEDED (Patient taking differently: INHALE 2 PUFFS INTO THE LUNGS EVERY MORNING, MAY ALSO USE IN THE EVENING AS NEEDED FOR SHORTNESS OF BREATH)          .      Objective:   Physical Exam  amb bf nad/ mild pseudowheeze = baseline     Vital signs reviewed:  Note pulse 133 and - Note on arrival 02 sats  96% on 2lpm POC    08/22/2013     204 > 03/20/2014  214  >214 04/03/2014 > 04/11/2014  220 > 227 04/26/2014 > 05/16/2014  227> 06/28/2014  228 >228 07/13/2014 > 09/20/2014  213 > 04/04/2015 216 >  05/21/2017  204   HEENT: nl dentition, turbinates, and orophanx. Nl external ear canals without cough reflex -  ? Cannon A waves present?   NECK :  without JVD/Nodes/TM/ nl carotid upstrokes bilaterally   LUNGS: no acc muscle use, clear bilaterally to A and P s cough on insp or exp   CV:  RRR  no s3 or murmur or increase in P2, no edema   ABD:  soft and nontender with nl excursion in the supine  position. No bruits or organomegaly, bowel sounds nl  MS:  warm without deformities, calf tenderness, cyanosis or clubbing/ chest tube insertion sites clean / dry   SKIN: warm and dry without lesions         CXR PA and Lateral:   05/21/2017 :    I personally reviewed images and agree with radiology impression as follows:    Grossly unchanged findings most compatible pulmonary fibrosis. No acute superimposed area of consolidation.   Labs ordered/ reviewed:      Chemistry      Component Value Date/Time   NA 138 05/21/2017 1944   K 3.9 05/21/2017 1944   CL 102 05/21/2017 1944   CO2 29 05/21/2017 1944   BUN 9 05/21/2017 1944   CREATININE 0.95 05/21/2017 1944   CREATININE 1.11 (H) 06/27/2016 1544      Component Value Date/Time   CALCIUM 10.0 05/21/2017 1944   ALKPHOS 80 04/18/2017 1730   AST 39 04/18/2017 1730   ALT 27 04/18/2017 1730   BILITOT 0.4 04/18/2017 1730        Lab Results  Component Value Date   WBC 7.8 05/21/2017   HGB 13.7 05/21/2017   HCT 43.3 05/21/2017   MCV 94.1 05/21/2017   PLT 368 05/21/2017      ekg   ST vs Aflutter s ischemic changes            Assessment & Plan:

## 2017-05-21 NOTE — Patient Instructions (Addendum)
[facility] for your [facility] rhythm problems   When you go home set up appt with Tammy NP   w/in 2 weeks with all your medications, even over the counter meds, separated in two separate bags, the ones you take no matter what vs the ones you stop once you feel better and take only as needed when you feel you need them.   Tammy  will generate for you a new user friendly medication calendar that will put us all on the same page re: your medication use.     Without this process, it simply isn't possible to assure that we are providing  your outpatient care  with  the attention to detail we feel you deserve.   If we cannot assure that you're getting that kind of care,  then we cannot manage your problem effectively from this clinic.  Once you have seen Tammy and we are sure that we're all on the same page with your medication use she will arrange follow up with me Dr Vassie LollAlva to work on your cough

## 2017-05-21 NOTE — ED Provider Notes (Signed)
[facility]-EMER[facility]ENCY DEPT Provider Note   CSN: [id number] Arrival date & time: 05/21/17  1715   History   Chief Complaint Chief Complaint  Patient presents with  . Atrial Fibrillation    new onset  . Shortness of Breath    HPI [name] is a 66 y.o. female.  This is a 66 year old female with PMH of asthma, GERD, pulmonary sarcoidosis apparently now in remission but with chronic lung fibrosis who presents with progressive shortness of breath over the past week and concern for EKG changes at her PCP. Patient was recently discharged from the hospital approximately 9 days prior for a right-sided pneumothorax and exacerbation of her chronic respiratory failure with hypoxia. Chest tube was placed and the patient was monitored closely by cardiothoracic surgery and pleurodesis was considered.  She underwent IBV via bronchoscopy on September 1 and chest tube was removed 2 days later. Since discharge the patient has noted progressive dyspnea on exertion for the past 2 days but otherwise ahs no complaints.  No leg swelling, chest pain, abdominal pain, change in bowel movements, numbness or tingling in extremities, blurry vision, change in cough or sputum production.    The history is provided by the patient and medical records.   Past Medical History:  Diagnosis Date  . Anxiety   . Asthma   . Barrett esophagus   . GERD (gastroesophageal reflux disease)   . Hiatal hernia   . IBS (irritable bowel syndrome)   . Migraines   . Pulmonary sarcoidosis [facility])     Patient Active Problem List   Diagnosis Date Noted  . Other chest pain   . Goals of care, counseling/discussion   . Palliative care by specialist   . Pneumothorax on right 04/18/2017  . Spontaneous pneumothorax 04/18/2017  . SOB (shortness of breath) 06/05/2016  . Osteopenia 02/12/2016  . Lumbar radiculopathy 01/26/2015  . Chronic respiratory failure with hypoxia (HCC) 04/12/2014  . Obesity (BMI 30-39.9) 02/08/2014  . Allergic  rhinitis 09/05/2010  . IBS 09/05/2010  . GERD 02/18/2010  . Barrett's esophagus 02/12/2010  . Upper airway cough syndrome 11/02/2009  . Anxiety state 08/29/2008  . History of colonic polyps 08/29/2008  . PULMONARY SARCOIDOSIS 08/12/2007  . Mild persistent asthma in adult without complication with component of vcd 08/12/2007  . Diaphragmatic hernia 08/12/2007  . Migraine 08/12/2007    Past Surgical History:  Procedure Laterality Date  . BRAVO PH STUDY N/A 10/10/2013   Procedure: BRAVO PH STUDY;  Surgeon: Hart Carwin, MD;  Location: [facility] ENDOSCOPY;  Service: Endoscopy;  Laterality: N/A;  placed 27  . BRONCHIAL BIOPSY  1996  . CATARACT EXTRACTION, BILATERAL    . CHEST TUBE INSERTION Right 04/23/2017   Procedure: CHEST TUBE INSERTION;  Surgeon: [name], MD;  Location[facility] OR;  Service: Thoracic;  Laterality: Right;  . CHOLECYSTECTOMY    . ESOPHAGOGASTRODUODENOSCOPY N/A 10/10/2013   Procedure: ESOPHAGOGASTRODUODENOSCOPY (EGD);  Surgeon: Hart Carwin, MD;  Location: [facility];  Service: Endoscopy;  Laterality: N/A;  pt asthmatic, RA 92% at rest.  Coughing frequently. Wheezes noted in upper lung fields at auscultation. Pt instructed to use home inhaler prior to procedure and placed on supportive O2 @ 2 lpm in pre-procedural. Teaching done ie. use of inhaler. Pt states she ju  . NISSEN FUNDOPLICATION    . ROTATOR CUFF REPAIR     x3(2 on L, 1 on R)  . VIDEO BRONCHOSCOPY WITH INSERTION OF INTERBRONCHIAL VALVE (IBV) N/A 05/08/2017   Procedure: VIDEO  BRONCHOSCOPY WITH INSERTION OF INTERBRONCHIAL VALVE (IBV);  Surgeon: Loreli Slot, MD;  Location: [facility] OR;  Service: Thoracic;  Laterality: N/A;    OB History    No data available       Home Medications    Prior to Admission medications   Medication Sig Start Date End Date Taking? Authorizing Provider  albuterol (VENTOLIN HFA) 108 (90 Base) MCG/ACT inhaler Inhale 2 puffs into the lungs as needed. Patient taking differently:  Inhale 2 puffs into the lungs every 4 (four) hours as needed for wheezing or shortness of breath.  12/11/16  Yes Copland, Gwenlyn Found, MD  ALOE VERA JUICE PO Take 120 mLs by mouth daily.    Yes [provider]  Artificial Tear Ointment (DRY EYES OP) Place 1 drop into both eyes daily as needed (dry eye). Use in between  Restatis   Yes [provider]  Ascorbic Acid (VITAMIN C PO) Take 1 tablet by mouth daily.   Yes [provider]  ASTRAGALUS PO Take 1 tablet by mouth daily.    Yes [provider]  budesonide-formoterol (SYMBICORT) 160-4.5 MCG/ACT inhaler Inhale 2 puffs into the lungs 2 (two) times daily. 05/21/17  Yes Nyoka Cowden, MD  buPROPion (WELLBUTRIN XL) 300 MG 24 hr tablet Take 1 tablet (300 mg total) by mouth daily. Patient taking differently: Take 300 mg by mouth daily before breakfast.  12/12/16  Yes Copland, Gwenlyn Found, MD  Calcium Carbonate-Vitamin D (CALCIUM-D PO) Take 1 tablet by mouth daily.   Yes [provider]  chlorpheniramine-HYDROcodone (TUSSIONEX) 10-8 MG/5ML SUER Take 5 mLs by mouth every 12 (twelve) hours. 05/12/17  Yes Kathlen Mody, MD  Collagen Hydrolysate POWD Take 1 scoop by mouth daily with supper.    Yes [provider]  Cyanocobalamin (VITAMIN B-12) 5000 MCG TBDP Take 5,000 mcg by mouth daily.   Yes [provider]  cycloSPORINE (RESTASIS) 0.05 % ophthalmic emulsion Place 1 drop into both eyes 2 (two) times daily.    Yes [provider]  fluticasone (FLONASE) 50 MCG/ACT nasal spray Place 1 spray into both nostrils 3 (three) times daily.   Yes [provider]  furosemide (LASIX) 20 MG tablet TAKE 1 TABLET (20 MG TOTAL) BY MOUTH DAILY. USE AS NEEDED FOR SWELLING Patient taking differently: Take 20 mg by mouth daily as needed (swelling).  09/18/16  Yes Copland, Gwenlyn Found, MD  ipratropium (ATROVENT HFA) 17 MCG/ACT inhaler Inhale 2 puffs into the lungs every 6 (six) hours as needed for wheezing.  06/27/16  Yes Saguier, Ramon Dredge, PA-C  loratadine (CLARITIN) 10 MG tablet Take 10 mg by mouth daily.   Yes [provider]  LORazepam (ATIVAN) 0.5 MG tablet TAKE 1/2 TO 1 TABLET BY MOUTH EVERY 8 TO 12 HRS ONLY AS NEEDED Patient taking differently: TAKE 1/2 TO 1 TABLET (0.25 - 0.5 MG) BY MOUTH EVERY 8 TO 12 HRS ONLY AS NEEDED FOR PANIC ATTACKS 09/18/16  Yes Copland, Gwenlyn Found, MD  Multiple Vitamin (MULTIVITAMIN WITH MINERALS) TABS tablet Take 1 tablet by mouth daily.   Yes [provider]  Nasal Moisturizer Combination (RHINASE) SOLN Place 2 sprays into the nose 2 (two) times daily as needed (dry, bloody nostrils).    Yes [provider]  Omega 3-6-9 Fatty Acids (OMEGA 3-6-9 COMPLEX PO) Take 1 capsule by mouth daily.   Yes [provider]  OXYGEN Inhale 2 L into the lungs continuous.   Yes [provider]  pantoprazole (PROTONIX) 20 MG  tablet TAKE 1 TABLET (20 MG TOTAL) BY MOUTH DAILY. 03/16/17  Yes Armbruster, Willaim RayasSteven P, MD  PREBIOTIC PRODUCT PO Take 1 scoop by mouth daily before breakfast.    Yes [provider]  ranitidine (ZANTAC) 150 MG tablet TAKE 1 TABLET (150 MG TOTAL) BY MOUTH 2 (TWO) TIMES DAILY. 05/18/17  Yes Armbruster, Willaim RayasSteven P, MD  SYMBICORT 160-4.5 MCG/ACT inhaler INHALE 2 PUFF INTO THE LUNGS TWICE A DAY AS NEEDED Patient taking differently: INHALE 2 PUFFS INTO THE LUNGS EVERY MORNING, MAY ALSO USE IN THE EVENING AS NEEDED FOR SHORTNESS OF BREATH 03/30/17  Yes Oretha MilchAlva, Rakesh V, MD    Family History Family History  Problem Relation Age of Onset  . Adopted: Yes    Social History Social History  Substance Use Topics  . Smoking status: Former Smoker    Packs/day: 1.00    Years: 20.00    Types: Cigarettes    Quit date: 09/08/1978  . Smokeless tobacco: Never Used  . Alcohol use No     Allergies   Gabapentin; Lactose intolerance (gi); Penicillins; Pineapple; and Aspirin   Review of Systems Review of Systems  Constitutional:  Negative for chills, diaphoresis, fatigue and fever.  HENT: Negative for ear pain and sore throat.   Eyes: Negative for pain and visual disturbance.  Respiratory: Positive for shortness of breath. Negative for cough and wheezing.   Cardiovascular: Negative for chest pain, palpitations and leg swelling.  Gastrointestinal: Negative for abdominal pain, constipation, diarrhea, nausea and vomiting.  Genitourinary: Negative for dysuria and hematuria.  Musculoskeletal: Negative for arthralgias and back pain.  Skin: Negative for color change and rash.  Neurological: Negative for dizziness, tremors, seizures, syncope, speech difficulty, weakness, light-headedness and numbness.  All other systems reviewed and are negative.    Physical Exam Updated Vital Signs BP (!) 113/94   Pulse (!) 111   Temp 98.2 F (36.8 C) (Oral)   Resp (!) 21   Ht 5\' 5"  (1.651 m)   Wt 92.8 kg (204 lb 9.6 oz)   SpO2 98%   BMI 34.05 kg/m   Physical Exam  Constitutional: She appears well-developed and well-nourished. No distress.  HENT:  Head: Normocephalic and atraumatic.  Eyes: Pupils are equal, round, and reactive to light. Conjunctivae are normal.  Neck: Neck supple.  Cardiovascular: Normal rate, regular rhythm and intact distal pulses.   No murmur heard. Pulmonary/Chest: Effort normal and breath sounds normal. No respiratory distress.  Abdominal: Soft. There is no tenderness.  Musculoskeletal: She exhibits no edema.  Neurological: She is alert.  Skin: Skin is warm and dry. No rash noted.  Psychiatric: She has a normal mood and affect.  Nursing note and vitals reviewed.  ED Treatments / Results  Labs (all labs ordered are listed, but only abnormal results are displayed) Labs Reviewed  BASIC METABOLIC PANEL - Abnormal; Notable for the following:       Result Value   Glucose, Bld 104 (*)    All other components within normal limits  CBC  TROPONIN I  TSH    EKG  EKG  Interpretation  Date/Time:  Thursday May 21 2017 17:20:27 EDT Ventricular Rate:  124 PR Interval:  166 QRS Duration: 88 QT Interval:  294 QTC Calculation: 422 R Axis:   77 Text Interpretation:  Sinus tachycardia Abnormal ekg Confirmed by Gerhard MunchLockwood, Robert [phone]([id number]) on 05/21/2017 7:16:19 PM       Radiology Dg Chest 2 View  Result Date: 05/21/2017 CLINICAL DATA:  Patient with shortness of  breath with exertion. Fatigue. History of right-sided pneumothorax. EXAM: CHEST  2 VIEW COMPARISON:  Chest radiograph 05/12/2017. FINDINGS: Stable cardiac and mediastinal contours. Grossly unchanged diffuse bilateral coarse interstitial pulmonary opacities. No superimposed area of pulmonary consolidation. No pleural effusion or pneumothorax. Thoracic spine degenerative changes. Cholecystectomy clips. IMPRESSION: Grossly unchanged findings most compatible pulmonary fibrosis. No acute superimposed area of consolidation. Electronically Signed   By: Annia Belt M.D.   On: 05/21/2017 18:22    Procedures Procedures (including critical care time)  Medications Ordered in ED Medications - No data to display   Initial Impression / Assessment and Plan / ED Course  I have reviewed the triage vital signs and the nursing notes.  Pertinent labs & imaging results that were available during my care of the patient were reviewed by me and considered in my medical decision making (see chart for details).     This is a 66 year old female with PMH of asthma, GERD, pulmonary sarcoidosis apparently now in remission but with chronic lung fibrosis who presents with progressive weakness over the past week and concern for EKG changes at her PCP.  Neuro exam unremarkable.  EKG reviewed from triage.  And displays sinus tachycardia in the 110s.  Twelve-lead from patient's office appointment reviewed. HR unchanged, sinus tachycardia. Rhythm is regular.   Basic laboratory studies reviewed. No leukocytosis on laboratory  studies, electrolytes within normal range.  Given well-appearing patient with chronic respiratory disease secondary to sarcoidosis, reassuring physical exam findings and clinical presentation, at this time no further workup is indicated in the ED.  Patient able to ambulate without change in oxygenation status or weakness.  Follow-up with PCP is indicated for longer-term heart monitoring including Holter or Ziopatch, as needed.  At this time given patient's EKG only showing baseline sinus tachycardia in the 100s, consistent with prior EKGs dating 3 years back, no further workup is indicated.  Low concern for PE at this time. No exertional dyspnea, no leg swelling, no recent travel or surgery. No change in SaO2 on pulse oximetry.  All questions answered prior to discharge, return precautions given.  Final Clinical Impressions(s) / ED Diagnoses   Final diagnoses:  Weakness    New Prescriptions Discharge Medication List as of 05/21/2017 10:50 PM       Shaune Pollack, MD 05/21/17 2349    Gerhard Munch, MD 05/25/17 1108

## 2017-05-21 NOTE — ED Notes (Signed)
Pt and family upset about wait times; Spoke to Dr.Briggs concerning delay in disposition; awaiting TSH levels; spoke to lab tech, lab never received TSH  Sandwich and apple juice given

## 2017-05-21 NOTE — ED Triage Notes (Addendum)
[name] arrives POV from post op appointment with Dr. Sherene SiresWert. [name] was found to be tachycardic with initial vs. EKG revealed new onset afib RVR. [name] denies chest pain other than at operative site but endorses increased fatigue, weakness, and sob especially with any ambulation. [name] wears 2 lpm [place] at all times. NAD

## 2017-05-21 NOTE — ED Notes (Signed)
Dr. Briggs at bedside

## 2017-05-22 ENCOUNTER — Encounter: Payer: Self-pay | Admitting: Internal Medicine

## 2017-05-22 NOTE — Assessment & Plan Note (Signed)
Disproportionate to objective findings and assoc with tachycardia, either st or AFlutter > to ER to sort out.

## 2017-05-22 NOTE — Assessment & Plan Note (Signed)
No recurrence, explained this is not a common complication of chronic cough but rather a manifestation of underlying lung dz that will need to be carefully monitored going forward but she needs to stick with one provider for f/u, either here or in the [facility] office ([name])
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
## 2017-05-22 NOTE — Assessment & Plan Note (Signed)
-   placed on 02 at [facility] from [facility] 03/08/14 >   rx 2lpm 24/7  - 05/16/2014   Walked 2lpm x one lap @ 185 stopped due to  desat to 88% at end with sob resolved on 3lpm - 05/17/2014 referred to rehab - 06/20/14  walked 674 feet with 2 breaks. The patient's lowest oxygen saturation was 84% , highest heart rate was 124 , and highest blood pressure was 124/80. The patient was on 6 liters of oxygen with a nasal cannula. Pt stated that fatigue hindered their walk test.    rx as of   .05/21/2017    2lpm > Adequate control on present rx, reviewed in detail with pt > no change in rx needed

## 2017-05-22 NOTE — Assessment & Plan Note (Signed)
Pfts 08/22/2013 nl lung vols,  dlco 53 corrects to 73% - PFTs 05/16/2014  Nl lung vols,  DLCO 42% corrects to 67%  - prednisone daily restarted 03/08/14  > stopped by pt around 9/15 /15 - 05/21/2017  After extensive coaching HFA effectiveness =    50% > continue symb 160 2bid   Not clear how much is any airways dz still present and note not on longterm prednisone so rec trial of symb 160 2bid used more consistently pending f/u for full med reconciliation

## 2017-05-25 ENCOUNTER — Ambulatory Visit (INDEPENDENT_AMBULATORY_CARE_PROVIDER_SITE_OTHER): Payer: Medicare Other | Admitting: Family Medicine

## 2017-05-25 VITALS — BP 99/67 | HR 111 | Temp 97.5°F | Ht 65.0 in | Wt 203.8 lb

## 2017-05-25 DIAGNOSIS — R29898 Other symptoms and signs involving the musculoskeletal system: Secondary | ICD-10-CM | POA: Diagnosis not present

## 2017-05-25 DIAGNOSIS — D860 Sarcoidosis of lung: Secondary | ICD-10-CM

## 2017-05-25 DIAGNOSIS — J45909 Unspecified asthma, uncomplicated: Secondary | ICD-10-CM | POA: Diagnosis not present

## 2017-05-25 DIAGNOSIS — D869 Sarcoidosis, unspecified: Secondary | ICD-10-CM | POA: Diagnosis not present

## 2017-05-25 DIAGNOSIS — R0600 Dyspnea, unspecified: Secondary | ICD-10-CM

## 2017-05-25 NOTE — Progress Notes (Addendum)
[facility] [address], [address] [place], [place] [address] [phone]  Date:  05/25/2017   Name:  [name]   DOB:  [birth year]   MRN:  [id number]  PCP:  Pearline Cables, MD    Chief Complaint: Hospitalization Follow-up   History of Present Illness:  [name] is a 66 y.o. very pleasant female patient who presents with the following:  She was in the hospital with a pneumothorax last month- she had a chest tube placed and then removed  Admit date: 04/18/2017 Discharge date: 05/12/2017  Admitted From: Home.  Disposition: HOme.  Recommendations for Outpatient Follow-up:  1. Follow up with PCP in 1-2 weeks 2. Please obtain BMP/CBC in one week 3. Please follow up with cardiothoracic surgery as recommended.  4. You will need a repeat CXR in before your visit.    Discharge Condition: stable.  CODE STATUS: full code.  Diet recommendation: Heart Healthy / Brief/Interim Summary: Annsleigh Dragoo Hyltonis a 66 y.o.femalewith medical history significant of pulmonary sarcoidosis, follows up with [name] presents with sudden onset of chest pain or sob. She was found to have large right pneumothorax with complete collapse. Cardiothoracic surgery consulted and she underwent chest tube placement on the rigth on 8/16, and s/p talk pleurodesis. Over the last one week, she has air leek and repeat CXR this am shows increase in the pneumothorax to 20%. Another issue is her pain at the site of the chest tube, which is better today. She underwent IBV via video bronchoscopy on 9/1 Chest tube was removed on 9/3. She tolerated and repeat CXR does not show any pneumothorax.   She then went to the ER on 9/13 and had a repeat CXR and labs, these looked ok  She is seeing Dr. Vassie Loll in November and she is seeing Tammy on 9/27 She had an IBV valve placed via bronch on 8/31- apparently this will be removed in a few weeks by CT  surgery This is her first pneumothorax- she is not aware of any family history of same  Her breathing and energy are getting better each day She is walking around the house ok, her energy level and breathing are getting a bit better She will see her CT surgeon this Wednesday.    No fever or chills over the last week or so.  She did have chills when she first was discharged home She is eating some, but not like her normal. She has lost weight  Her weight back in March was 233 lbs Wt Readings from Last 3 Encounters:  05/25/17 203 lb 12.8 oz (92.4 kg)  05/21/17 204 lb 9.6 oz (92.8 kg)  05/21/17 204 lb (92.5 kg)   Pulse Readings from Last 3 Encounters:  05/25/17 (!) 125  05/21/17 (!) 111  05/21/17 (!) 133   Her BP is a bit low, but she is not taking any BP meds, she does not use her lasix   BP Readings from Last 3 Encounters:  05/25/17 99/67  05/21/17 (!) 113/94  05/21/17 110/78   Lab Results  Component Value Date   TSH 1.38 09/03/2016   Her husband asks several questions about Allice's long term outlook.  He is concerned that there is not much therapy left that can improve her lungs and that she may need a lung transplant.  He would like to start this process for her if appropriate.  I agreed that I think  she is on all appropriate medical therapy at this time, and that her lungs are not likely to improve from this point Dg Chest 2 View  Result Date: 05/21/2017 CLINICAL DATA:  Patient with shortness of breath with exertion. Fatigue. History of right-sided pneumothorax. EXAM: CHEST  2 VIEW COMPARISON:  Chest radiograph 05/12/2017. FINDINGS: Stable cardiac and mediastinal contours. Grossly unchanged diffuse bilateral coarse interstitial pulmonary opacities. No superimposed area of pulmonary consolidation. No pleural effusion or pneumothorax. Thoracic spine degenerative changes. Cholecystectomy clips. IMPRESSION: Grossly unchanged findings most compatible pulmonary fibrosis. No acute  superimposed area of consolidation. Electronically Signed   By: Annia Belt M.D.   On: 05/21/2017 18:22   Dg Chest 2 View  Result Date: 05/12/2017 CLINICAL DATA:  Pneumothorax, shortness of Breath EXAM: CHEST  2 VIEW COMPARISON:  05/11/2017 FINDINGS: Small lucency at the right apex is stable compatible with tiny right apical pneumothorax. Interval removal of right chest tube. Diffuse interstitial prominence throughout the lungs. Heart is normal size. IMPRESSION: Tiny residual right apical pneumothorax following removal of right chest tube, unchanged. Stable diffuse interstitial prominence, likely fibrosis. Electronically Signed   By: Charlett Nose M.D.   On: 05/12/2017 07:21   Dg Chest 2 View  Result Date: 04/26/2017 CLINICAL DATA:  Pt admitted for pneumothorax. Pt has chest tube in right lung. Hx of asthma, barrett esophagus and pulmonary sarcoidosis. EXAM: CHEST  2 VIEW COMPARISON:  04/25/2017 FINDINGS: Moderate right pneumothorax, which appears mildly increased in size from the previous day's study. There is extensive subcutaneous emphysema mostly on the right, without change. Right chest tube tip projects at the right apex, also stable. Lungs show irregular interstitial thickening bilaterally consistent with fibrosis, stable. No left pneumothorax. IMPRESSION: 1. Mild increase in the size of the right pneumothorax since the previous day's study. 2. No other change. Electronically Signed   By: Amie Portland M.D.   On: 04/26/2017 10:45   Dg Chest Port 1 View  Result Date: 05/11/2017 CLINICAL DATA:  Right-sided pneumothorax. EXAM: PORTABLE CHEST 1 VIEW COMPARISON:  One-view chest x-ray 05/10/2017 FINDINGS: Heart is enlarged. Interstitial edema is slightly increased. Right pleural effusion is unchanged. A right-sided chest tube remains in place. The side port is at the chest wall. A small apical pneumothorax is present. IMPRESSION: 1. Right-sided chest tube in place with small apical pneumothorax. 2.  Persistent right pleural effusion. 3. Slight increase in interstitial edema. Electronically Signed   By: Marin Roberts M.D.   On: 05/11/2017 07:29   Dg Chest Port 1 View  Result Date: 05/10/2017 CLINICAL DATA:  Follow-up right pneumothorax with chest tube in place. EXAM: PORTABLE CHEST 1 VIEW COMPARISON:  05/09/2017, 05/07/2017 and earlier. FINDINGS: Right chest tube in place without visible pneumothorax. Pleural thickening and/or loculated fluid involving the lateral right pleural space. Chronic interstitial pulmonary fibrosis, unchanged. No new pulmonary parenchymal abnormalities. Small amount of subcutaneous emphysema in the right lateral chest wall, unchanged. IMPRESSION: 1. Right chest tube in place with no visible pneumothorax. 2. Pleural thickening and/or loculated fluid involving the right lateral pleural space. 3. Chronic interstitial fibrosis. No new/acute pulmonary parenchymal abnormalities. Electronically Signed   By: Hulan Saas M.D.   On: 05/10/2017 07:23   Dg Chest Port 1 View  Result Date: 05/09/2017 CLINICAL DATA:  Pneumothorax, right chest tube EXAM: PORTABLE CHEST 1 VIEW COMPARISON:  05/07/2017 FINDINGS: Stable right chest tube position extending to the apex. Suspect similar small residual right apical pneumothorax without significant change. Extensive reticulonodular interstitial opacities and parenchymal  scarring bilaterally compatible with history of sarcoid. Slightly improved lung volumes. Bronchial valves noted in the right hilum. No large effusion. Trachea is midline. Stable heart size and vascularity. IMPRESSION: Suspect stable right apical small pneumothorax. Stable chest tube position. Slight improvement in lung volumes with similar chronic lung disease. Electronically Signed   By: Judie Petit.  Shick M.D.   On: 05/09/2017 09:36   Dg Chest Port 1 View  Result Date: 05/07/2017 CLINICAL DATA:  Chest tube EXAM: PORTABLE CHEST 1 VIEW COMPARISON:  05/06/2017 FINDINGS: Right chest  tube remains in place. Small residual right apical pneumothorax, less than 5%. Very low lung volumes with diffuse bilateral airspace disease. Lung volumes have decreased further since prior study. No definite effusions. IMPRESSION: Significant decrease in the size of the right pneumothorax, now less than 5%. New graft very low lung volumes with diffuse bilateral airspace disease. Electronically Signed   By: Charlett Nose M.D.   On: 05/07/2017 07:33   Dg Chest Port 1 View  Result Date: 05/06/2017 CLINICAL DATA:  Right chest tube positioning. EXAM: PORTABLE CHEST 1 VIEW COMPARISON:  Multiple prior chest x-rays, most recently from yesterday. FINDINGS: Right-sided chest tube in place with interval increase in size of the small right pneumothorax, now approximately 20%. The chest tube side port is now just inside the inner margin of the ribs. Unchanged low lung volumes with coarsened basilar predominant opacities. The enlarged cardiomediastinal silhouette is unchanged. IMPRESSION: 1. Interval increase in size of the small right pneumothorax, now approximately 20%. The chest tube side port projects just inside the inner margin of the ribs and could potentially be partially extrapleural. 2. Unchanged pulmonary fibrosis. These results will be called to the ordering clinician or representative by the Radiologist Assistant, and communication documented in the PACS or zVision Dashboard. Electronically Signed   By: Obie Dredge M.D.   On: 05/06/2017 08:23   Dg Chest Port 1 View  Result Date: 05/05/2017 CLINICAL DATA:  Right pneumothorax EXAM: PORTABLE CHEST 1 VIEW COMPARISON:  Two days ago FINDINGS: Right pneumothorax is decreased from prior, now less than 5%. Right-sided chest tube is somewhat shorter than before, but side-port still projecting in side the pleural cavity. Low volume chest with coarse opacities mainly at the bases. Apparent cardiomegaly, accentuated by technique. Enlarged hila, reference chest CT  04/19/2017. IMPRESSION: 1. Decreased right apical pneumothorax, now less than 5%. The right-sided chest tube is slightly shorter than before. 2. Pulmonary fibrosis. Electronically Signed   By: Marnee Spring M.D.   On: 05/05/2017 07:48   Dg Chest Port 1 View  Result Date: 05/03/2017 CLINICAL DATA:  Followup right pneumothorax and atelectasis. Sarcoidosis. EXAM: PORTABLE CHEST 1 VIEW COMPARISON:  05/01/2017 FINDINGS: Right chest tube remains in place. Allowing for more lordotic positioning on current exam, a 20-25% right apical pneumothorax shows no significant change in size. Chronic interstitial lung disease shows no significant interval change. No evidence of superimposed pulmonary consolidation. Heart size is normal. IMPRESSION: No significant change in right apical pneumothorax with chest tube remaining in place. Chronic interstitial lung disease. Electronically Signed   By: Myles Rosenthal M.D.   On: 05/03/2017 07:43   Dg Chest Port 1 View  Result Date: 05/01/2017 CLINICAL DATA:  Pneumothorax with chest tube present. EXAM: PORTABLE CHEST 1 VIEW COMPARISON:  April 30, 2017 FINDINGS: Right-sided chest tube position is unchanged. Pneumothorax in the right apex and lateral regions is stable in size and contour without tension component. There is extensive subcutaneous air on the right. Fibrosis  in the mid and lower lung zones remain stable. There is a small right pleural effusion. There is no appreciable airspace consolidation. However, there is increased atelectasis in the left base compared to 1 day prior. Heart is mildly enlarged with pulmonary vascularity within normal limits. No adenopathy. No bone lesions. IMPRESSION: 1. Chest tube position on the right unchanged. No change and right apical and lateral pneumothorax. No tension component. Extensive subcutaneous air on the right is stable. 2. Increase in left base atelectasis. Persistent fibrosis in the mid and lower lung zones. Small right pleural  effusion. 3.  Stable cardiac silhouette. Electronically Signed   By: Bretta Bang III M.D.   On: 05/01/2017 07:19   Dg Chest Port 1 View  Result Date: 04/30/2017 CLINICAL DATA:  History of pneumothorax, right chest tube, followup EXAM: PORTABLE CHEST 1 VIEW COMPARISON:  Portable chest x-ray of 04/29/2017 FINDINGS: The volume of the right pneumothorax has increased slightly, with no change in position of the right chest tube. Bibasilar pulmonary fibrotic change is again noted. Subcutaneous emphysema has not changed significantly. Heart size is stable. IMPRESSION: 1. Slight increase in volume of the right pneumothorax with no change in right chest tube. 2. No change in subcutaneous emphysema. Electronically Signed   By: Dwyane Dee M.D.   On: 04/30/2017 10:20   Dg Chest Port 1 View  Result Date: 04/29/2017 CLINICAL DATA:  Chest tube in place. EXAM: PORTABLE CHEST 1 VIEW COMPARISON:  Radiograph of April 28, 2017. FINDINGS: Stable cardiomediastinal silhouette. Right-sided chest tube is unchanged position, with mild right apical pneumothorax noted. Stable subcutaneous emphysema is seen over both supraclavicular regions and right lateral chest wall. Stable coarse interstitial densities are noted throughout both lungs concerning for edema or chronic interstitial lung disease. IMPRESSION: Mildly decreased right apical pneumothorax is noted. Stable subcutaneous emphysema is noted. Right-sided chest tube is unchanged in position. Electronically Signed   By: Lupita Raider, M.D.   On: 04/29/2017 18:36   Dg Chest Port 1 View  Result Date: 04/28/2017 CLINICAL DATA:  Right-sided chest tube EXAM: PORTABLE CHEST 1 VIEW COMPARISON:  04/27/2017 FINDINGS: Cardiac shadow is stable. Persistent right apical pneumothorax is noted but slightly decreased when compared with the prior exam. Right-sided chest tube remains in place. Increased interstitial markings are again noted and stable. No new focal abnormality is seen.  IMPRESSION: Improving right pneumothorax.  The remainder of the exam is stable. Electronically Signed   By: Alcide Clever M.D.   On: 04/28/2017 07:45   Dg Chest Port 1 View  Result Date: 04/27/2017 CLINICAL DATA:  Chest pain and shortness of breath. Chest tube treatment for right-sided pneumothorax. EXAM: PORTABLE CHEST 1 VIEW COMPARISON:  Portable chest x-ray of April 23, 2017 and [place] and lateral chest x-ray of April 26, 2017 FINDINGS: The lungs are well-expanded. The interstitial markings remain increased. There is a persistent right-sided pneumo pneumothorax with the pleural line noted along a portion of the course of the chest tube it does not appear to of increased in size since yesterday's study. There is no mediastinal shift. There is no significant pleural effusion. The heart is normal in size. There is right hilar prominence that is unchanged. There is extensive subcutaneous emphysema greatest on the right but also of the base of the left neck. IMPRESSION: Persistent right-sided pneumothorax amounting to approximately 30% of the lung volume. No mediastinal shift. The right chest tube tip remains in the pulmonary apex. Persistently increased interstitial markings likely reflecting known sarcoidosis.  Electronically Signed   By: David  [place] M.D.   On: 04/27/2017 07:39     Patient Active Problem List   Diagnosis Date Noted  . Other chest pain   . Goals of care, counseling/discussion   . Palliative care by specialist   . Pneumothorax on right 04/18/2017  . Spontaneous pneumothorax 04/18/2017  . SOB (shortness of breath) 06/05/2016  . Osteopenia 02/12/2016  . Lumbar radiculopathy 01/26/2015  . Chronic respiratory failure with hypoxia (HCC) 04/12/2014  . Obesity (BMI 30-39.9) 02/08/2014  . Allergic rhinitis 09/05/2010  . IBS 09/05/2010  . GERD 02/18/2010  . Barrett's esophagus 02/12/2010  . Upper airway cough syndrome 11/02/2009  . Anxiety state 08/29/2008  . History of colonic polyps  08/29/2008  . PULMONARY SARCOIDOSIS 08/12/2007  . Mild persistent asthma in adult without complication with component of vcd 08/12/2007  . Diaphragmatic hernia 08/12/2007  . Migraine 08/12/2007    Past Medical History:  Diagnosis Date  . Anxiety   . Asthma   . Barrett esophagus   . GERD (gastroesophageal reflux disease)   . Hiatal hernia   . IBS (irritable bowel syndrome)   . Migraines   . Pulmonary sarcoidosis [facility])     Past Surgical History:  Procedure Laterality Date  . BRAVO PH STUDY N/A 10/10/2013   Procedure: BRAVO PH STUDY;  Surgeon: Hart Carwin, MD;  Location: [facility] ENDOSCOPY;  Service: Endoscopy;  Laterality: N/A;  placed 27  . BRONCHIAL BIOPSY  1996  . CATARACT EXTRACTION, BILATERAL    . CHEST TUBE INSERTION Right 04/23/2017   Procedure: CHEST TUBE INSERTION;  Surgeon: [name], MD;  Location: [facility] OR;  Service: Thoracic;  Laterality: Right;  . CHOLECYSTECTOMY    . ESOPHAGOGASTRODUODENOSCOPY N/A 10/10/2013   Procedure: ESOPHAGOGASTRODUODENOSCOPY (EGD);  Surgeon: Hart Carwin, MD;  Location: [facility];  Service: Endoscopy;  Laterality: N/A;  pt asthmatic, RA 92% at rest.  Coughing frequently. Wheezes noted in upper lung fields at auscultation. Pt instructed to use home inhaler prior to procedure and placed on supportive O2 @ 2 lpm in pre-procedural. Teaching done ie. use of inhaler. Pt states she ju  . NISSEN FUNDOPLICATION    . ROTATOR CUFF REPAIR     x3(2 on L, 1 on R)  . VIDEO BRONCHOSCOPY WITH INSERTION OF INTERBRONCHIAL VALVE (IBV) N/A 05/08/2017   Procedure: VIDEO BRONCHOSCOPY WITH INSERTION OF INTERBRONCHIAL VALVE (IBV);  Surgeon: Loreli Slot, MD;  Location: [facility] OR;  Service: Thoracic;  Laterality: N/A;    Social History  Substance Use Topics  . Smoking status: Former Smoker    Packs/day: 1.00    Years: 20.00    Types: Cigarettes    Quit date: 09/08/1978  . Smokeless tobacco: Never Used  . Alcohol use No    Family History  Problem Relation  Age of Onset  . Adopted: Yes    Allergies  Allergen Reactions  . Gabapentin Other (See Comments)    Edema on 2 separate trials  . Lactose Intolerance (Gi) Diarrhea and Other (See Comments)    Stomach cramps  . Penicillins Other (See Comments)    Unknown - childhood allergy Has patient had a PCN reaction causing immediate rash, facial/tongue/throat swelling, SOB or lightheadedness with hypotension: Unknown Has patient had a PCN reaction causing severe rash involving mucus membranes or skin necrosis: Unknown Has patient had a PCN reaction that required hospitalization: Unknown Has patient had a PCN reaction occurring within the last 10 years: No If all of the  above answers are "NO", then may proceed with Cephalosporin use.  Marland Kitchen Pineapple Hives and Itching  . Aspirin Other (See Comments)    gastritis    Medication list has been reviewed and updated.  Current Outpatient Prescriptions on File Prior to Visit  Medication Sig Dispense Refill  . albuterol (VENTOLIN HFA) 108 (90 Base) MCG/ACT inhaler Inhale 2 puffs into the lungs as needed. (Patient taking differently: Inhale 2 puffs into the lungs every 4 (four) hours as needed for wheezing or shortness of breath. ) 1 Inhaler 2  . ALOE VERA JUICE PO Take 120 mLs by mouth daily.     . Artificial Tear Ointment (DRY EYES OP) Place 1 drop into both eyes daily as needed (dry eye). Use in between  Restatis    . Ascorbic Acid (VITAMIN C PO) Take 1 tablet by mouth daily.    . ASTRAGALUS PO Take 1 tablet by mouth daily.     . budesonide-formoterol (SYMBICORT) 160-4.5 MCG/ACT inhaler Inhale 2 puffs into the lungs 2 (two) times daily. 1 Inhaler 11  . buPROPion (WELLBUTRIN XL) 300 MG 24 hr tablet Take 1 tablet (300 mg total) by mouth daily. (Patient taking differently: Take 300 mg by mouth daily before breakfast. ) 30 tablet 5  . Calcium Carbonate-Vitamin D (CALCIUM-D PO) Take 1 tablet by mouth daily.    . chlorpheniramine-HYDROcodone (TUSSIONEX) 10-8  MG/5ML SUER Take 5 mLs by mouth every 12 (twelve) hours. 140 mL 0  . Collagen Hydrolysate POWD Take 1 scoop by mouth daily with supper.     . Cyanocobalamin (VITAMIN B-12) 5000 MCG TBDP Take 5,000 mcg by mouth daily.    . cycloSPORINE (RESTASIS) 0.05 % ophthalmic emulsion Place 1 drop into both eyes 2 (two) times daily.     . fluticasone (FLONASE) 50 MCG/ACT nasal spray Place 1 spray into both nostrils 3 (three) times daily.    . OXYGEN Inhale 2 L into the lungs continuous.    . pantoprazole (PROTONIX) 20 MG tablet TAKE 1 TABLET (20 MG TOTAL) BY MOUTH DAILY. 90 tablet 3  . PREBIOTIC PRODUCT PO Take 1 scoop by mouth daily before breakfast.     . ranitidine (ZANTAC) 150 MG tablet TAKE 1 TABLET (150 MG TOTAL) BY MOUTH 2 (TWO) TIMES DAILY. 120 tablet 2  . SYMBICORT 160-4.5 MCG/ACT inhaler INHALE 2 PUFF INTO THE LUNGS TWICE A DAY AS NEEDED (Patient taking differently: INHALE 2 PUFFS INTO THE LUNGS EVERY MORNING, MAY ALSO USE IN THE EVENING AS NEEDED FOR SHORTNESS OF BREATH) 10.2 Inhaler 2  . furosemide (LASIX) 20 MG tablet TAKE 1 TABLET (20 MG TOTAL) BY MOUTH DAILY. USE AS NEEDED FOR SWELLING (Patient taking differently: Take 20 mg by mouth daily as needed (swelling). ) 90 tablet 1  . ipratropium (ATROVENT HFA) 17 MCG/ACT inhaler Inhale 2 puffs into the lungs every 6 (six) hours as needed for wheezing. 1 Inhaler 1  . loratadine (CLARITIN) 10 MG tablet Take 10 mg by mouth daily.    Marland Kitchen LORazepam (ATIVAN) 0.5 MG tablet TAKE 1/2 TO 1 TABLET BY MOUTH EVERY 8 TO 12 HRS ONLY AS NEEDED (Patient taking differently: TAKE 1/2 TO 1 TABLET (0.25 - 0.5 MG) BY MOUTH EVERY 8 TO 12 HRS ONLY AS NEEDED FOR PANIC ATTACKS) 30 tablet 0  . Multiple Vitamin (MULTIVITAMIN WITH MINERALS) TABS tablet Take 1 tablet by mouth daily.    . Nasal Moisturizer Combination (RHINASE) SOLN Place 2 sprays into the nose 2 (two) times daily as needed (dry,  bloody nostrils).     . Omega 3-6-9 Fatty Acids (OMEGA 3-6-9 COMPLEX PO) Take 1 capsule by  mouth daily.     No current facility-administered medications on file prior to visit.     Review of Systems:  As per HPI- otherwise negative.   Physical Examination: Vitals:   05/25/17 1235  BP: 99/67  Pulse: (!) 125  Temp: (!) 97.5 F (36.4 C)  SpO2: 96%   Vitals:   05/25/17 1235  Weight: 203 lb 12.8 oz (92.4 kg)  Height: 5\' 5"  (1.651 m)   Body mass index is 33.91 kg/m. Ideal Body Weight: Weight in (lb) to have BMI = 25: 149.9  GEN: WDWN, NAD, Non-toxic, A & O x 3, looks her normal self.  Wearing oxygen via [place] HEENT: Atraumatic, Normocephalic. Neck supple. No masses, No LAD. Ears and Nose: No external deformity. CV: RRR, No M/G/R. No JVD. No thrill. No extra heart sounds. PULM: CTA B, no wheezes, crackles, rhonchi. No retractions. No resp. distress. No accessory muscle use. ABD: S, NT, ND, +BS. No rebound. No HSM. EXTR: No c/c/e NEURO Normal gait.  PSYCH: Normally interactive. Conversant. Not depressed or anxious appearing.  Calm demeanor.    Assessment and Plan: Pulmonary sarcoidosis (HCC)  Dyspnea, unspecified type  Muscular deconditioning  Here today to follow-up from recent admission for a pneumothorax during which she underwent a chest tube and a "talc procedure" for her lungs.  She was then seen in the ER last week when Dr. Sherene Sires was concerned about EKG changes but all turned out to be ok She is getting back to her baseline currently, is regaining strength, but has lost a good bit of weight Encouraged her to work on eating enough protein, she is drinking ensure shakes when she does not feel like eating a meal Advised that the decision to consider lung transplant is beyond my expertise, but I encouraged them to discuss with her pulmonologist, Dr. Vassie Loll, at next visit  She had repeat labs and CXR in the ER on 9/13 so do not need to do these today Flu shot today She will see me in 4-6 months- sooner if I can help her in any way  Signed Abbe Amsterdam, MD

## 2017-05-25 NOTE — Progress Notes (Signed)
Pre visit review using our clinic tool,if applicable. No additional management support is needed unless otherwise documented below in the visit note.  

## 2017-05-25 NOTE — Patient Instructions (Addendum)
It was good to see you today-  I am glad that you are feeling better from your recent illness!   Your repeat x-rays and labs from the ER looked fine on 9/13  Please come and see me in about 4 -6 months   We will give you your flu shot today

## 2017-05-26 ENCOUNTER — Other Ambulatory Visit: Payer: Self-pay | Admitting: Cardiothoracic Surgery

## 2017-05-26 DIAGNOSIS — J9383 Other pneumothorax: Secondary | ICD-10-CM

## 2017-05-27 ENCOUNTER — Ambulatory Visit (INDEPENDENT_AMBULATORY_CARE_PROVIDER_SITE_OTHER): Payer: Medicare Other | Admitting: Cardiothoracic Surgery

## 2017-05-27 ENCOUNTER — Ambulatory Visit
Admission: RE | Admit: 2017-05-27 | Discharge: 2017-05-27 | Disposition: A | Discharge status: Home / Self Care | Payer: Medicare Other | Source: Ambulatory Visit | Attending: Cardiothoracic Surgery | Admitting: Cardiothoracic Surgery

## 2017-05-27 VITALS — BP 120/87 | HR 114 | Resp 16 | Ht 65.0 in | Wt 203.0 lb

## 2017-05-27 DIAGNOSIS — J939 Pneumothorax, unspecified: Secondary | ICD-10-CM

## 2017-05-27 DIAGNOSIS — J9383 Other pneumothorax: Secondary | ICD-10-CM | POA: Diagnosis not present

## 2017-05-27 DIAGNOSIS — Z9889 Other specified postprocedural states: Secondary | ICD-10-CM

## 2017-05-27 NOTE — Progress Notes (Signed)
PCP is Copland, Gwenlyn Found, MD Referring Provider is Oretha Milch, MD  Chief Complaint  Patient presents with  . Spontaneous Pneumothorax    RIGHT ...f/u from hospital with CXR    HPI: Scheduled office follow-up after hospitalization for spontaneous right pneumothorax with previous history of poor sarcoidosis and Pulmicort fibrosis. She was treated with a chest tube which had prolonged air leak then endobronchial valves placed by Dr. Dorris Fetch. That stopped air leak and the chest tube was removed. She remains on home oxygen and is short of breath walking across her house. Chest x-ray today shows chronic pulmonary fibrosis but no pneumothorax.  Her chest tube site is healing well and the sutures removed. A small pigtail catheter placed in the ED had a suture which was also removed Past Medical History:  Diagnosis Date  . Anxiety   . Asthma   . Barrett esophagus   . GERD (gastroesophageal reflux disease)   . Hiatal hernia   . IBS (irritable bowel syndrome)   . Migraines   . Pulmonary sarcoidosis [facility])     Past Surgical History:  Procedure Laterality Date  . BRAVO PH STUDY N/A 10/10/2013   Procedure: BRAVO PH STUDY;  Surgeon: Hart Carwin, MD;  Location: [facility] ENDOSCOPY;  Service: Endoscopy;  Laterality: N/A;  placed 27  . BRONCHIAL BIOPSY  1996  . CATARACT EXTRACTION, BILATERAL    . CHEST TUBE INSERTION Right 04/23/2017   Procedure: CHEST TUBE INSERTION;  Surgeon: [name], MD;  Location: [facility] OR;  Service: Thoracic;  Laterality: Right;  . CHOLECYSTECTOMY    . ESOPHAGOGASTRODUODENOSCOPY N/A 10/10/2013   Procedure: ESOPHAGOGASTRODUODENOSCOPY (EGD);  Surgeon: Hart Carwin, MD;  Location: [facility];  Service: Endoscopy;  Laterality: N/A;  pt asthmatic, RA 92% at rest.  Coughing frequently. Wheezes noted in upper lung fields at auscultation. Pt instructed to use home inhaler prior to procedure and placed on supportive O2 @ 2 lpm in pre-procedural. Teaching done ie. use of inhaler.  Pt states she ju  . NISSEN FUNDOPLICATION    . ROTATOR CUFF REPAIR     x3(2 on L, 1 on R)  . VIDEO BRONCHOSCOPY WITH INSERTION OF INTERBRONCHIAL VALVE (IBV) N/A 05/08/2017   Procedure: VIDEO BRONCHOSCOPY WITH INSERTION OF INTERBRONCHIAL VALVE (IBV);  Surgeon: Loreli Slot, MD;  Location: [facility] OR;  Service: Thoracic;  Laterality: N/A;    Family History  Problem Relation Age of Onset  . Adopted: Yes    Social History Social History  Substance Use Topics  . Smoking status: Former Smoker    Packs/day: 1.00    Years: 20.00    Types: Cigarettes    Quit date: 09/08/1978  . Smokeless tobacco: Never Used  . Alcohol use No    Current Outpatient Prescriptions  Medication Sig Dispense Refill  . albuterol (VENTOLIN HFA) 108 (90 Base) MCG/ACT inhaler Inhale 2 puffs into the lungs as needed. (Patient taking differently: Inhale 2 puffs into the lungs every 4 (four) hours as needed for wheezing or shortness of breath. ) 1 Inhaler 2  . ALOE VERA JUICE PO Take 120 mLs by mouth daily.     . Artificial Tear Ointment (DRY EYES OP) Place 1 drop into both eyes daily as needed (dry eye). Use in between  Restatis    . Ascorbic Acid (VITAMIN C PO) Take 1 tablet by mouth daily.    . ASTRAGALUS PO Take 1 tablet by mouth daily.     . budesonide-formoterol (SYMBICORT) 160-4.5 MCG/ACT inhaler  Inhale 2 puffs into the lungs 2 (two) times daily. 1 Inhaler 11  . buPROPion (WELLBUTRIN XL) 300 MG 24 hr tablet Take 1 tablet (300 mg total) by mouth daily. (Patient taking differently: Take 300 mg by mouth daily before breakfast. ) 30 tablet 5  . Calcium Carbonate-Vitamin D (CALCIUM-D PO) Take 1 tablet by mouth daily.    . chlorpheniramine-HYDROcodone (TUSSIONEX) 10-8 MG/5ML SUER Take 5 mLs by mouth every 12 (twelve) hours. 140 mL 0  . Collagen Hydrolysate POWD Take 1 scoop by mouth daily with supper.     . Cyanocobalamin (VITAMIN B-12) 5000 MCG TBDP Take 5,000 mcg by mouth daily.    . cycloSPORINE (RESTASIS) 0.05 %  ophthalmic emulsion Place 1 drop into both eyes 2 (two) times daily.     . fluticasone (FLONASE) 50 MCG/ACT nasal spray Place 1 spray into both nostrils 3 (three) times daily.    . furosemide (LASIX) 20 MG tablet TAKE 1 TABLET (20 MG TOTAL) BY MOUTH DAILY. USE AS NEEDED FOR SWELLING (Patient taking differently: Take 20 mg by mouth daily as needed (swelling). ) 90 tablet 1  . ipratropium (ATROVENT HFA) 17 MCG/ACT inhaler Inhale 2 puffs into the lungs every 6 (six) hours as needed for wheezing. 1 Inhaler 1  . loratadine (CLARITIN) 10 MG tablet Take 10 mg by mouth daily.    Marland Kitchen LORazepam (ATIVAN) 0.5 MG tablet TAKE 1/2 TO 1 TABLET BY MOUTH EVERY 8 TO 12 HRS ONLY AS NEEDED (Patient taking differently: TAKE 1/2 TO 1 TABLET (0.25 - 0.5 MG) BY MOUTH EVERY 8 TO 12 HRS ONLY AS NEEDED FOR PANIC ATTACKS) 30 tablet 0  . Multiple Vitamin (MULTIVITAMIN WITH MINERALS) TABS tablet Take 1 tablet by mouth daily.    . Nasal Moisturizer Combination (RHINASE) SOLN Place 2 sprays into the nose 2 (two) times daily as needed (dry, bloody nostrils).     . Omega 3-6-9 Fatty Acids (OMEGA 3-6-9 COMPLEX PO) Take 1 capsule by mouth daily.    . OXYGEN Inhale 2 L into the lungs continuous.    . pantoprazole (PROTONIX) 20 MG tablet TAKE 1 TABLET (20 MG TOTAL) BY MOUTH DAILY. 90 tablet 3  . PREBIOTIC PRODUCT PO Take 1 scoop by mouth daily before breakfast.     . ranitidine (ZANTAC) 150 MG tablet TAKE 1 TABLET (150 MG TOTAL) BY MOUTH 2 (TWO) TIMES DAILY. 120 tablet 2  . SYMBICORT 160-4.5 MCG/ACT inhaler INHALE 2 PUFF INTO THE LUNGS TWICE A DAY AS NEEDED (Patient taking differently: INHALE 2 PUFFS INTO THE LUNGS EVERY MORNING, MAY ALSO USE IN THE EVENING AS NEEDED FOR SHORTNESS OF BREATH) 10.2 Inhaler 2   No current facility-administered medications for this visit.     Allergies  Allergen Reactions  . Gabapentin Other (See Comments)    Edema on 2 separate trials  . Lactose Intolerance (Gi) Diarrhea and Other (See Comments)     Stomach cramps  . Penicillins Other (See Comments)    Unknown - childhood allergy Has patient had a PCN reaction causing immediate rash, facial/tongue/throat swelling, SOB or lightheadedness with hypotension: Unknown Has patient had a PCN reaction causing severe rash involving mucus membranes or skin necrosis: Unknown Has patient had a PCN reaction that required hospitalization: Unknown Has patient had a PCN reaction occurring within the last 10 years: No If all of the above answers are "NO", then may proceed with Cephalosporin use.  Marland Kitchen Pineapple Hives and Itching  . Aspirin Other (See Comments)    gastritis  Review of Systems  Doing well at home. She is a nonsmoker. Currently in a wheelchair but starting to ambulate Remains on 2 L of option but she gets short of breath with ambulation probably from deconditioning BP 120/87 (BP Location: Right Arm, Patient Position: Sitting, Cuff Size: Large)   Pulse (!) 114   Resp 16   Ht  (1.651 m)   Wt 203 lb (92.1 kg)   SpO2 99% Comment: SET ON # 6  PER CONSERVING DEVICE  BMI 33.78 kg/m     Physical Exam      Exam    General- alert and comfortable   Lungs- clear without rales, wheezes, chest tube site with clean granulation tissue and treated with Betadine and a Neosporin Band-Aid   Cor- regular rate and rhythm, no murmur , gallop   Abdomen- soft, non-tender   Extremities - warm, non-tender, minimal edema   Neuro- oriented, appropriate, no focal weakness   Diagnostic Tests:  chest x-ray without pneumothorax   chronic bilateral pulmonary fibrosis unchanged No pleural effusion  Impression:  status post recent spontaneous pneumothorax treated with chest tube then endobronchial valves.   The endobronchial valves need to stay in place for 6 weeks to allow lung healing and prevent recurrent pneumothorax  Plan:return in one month with chest x-ray to discuss timing of endobronchial valve removal under general anesthesia as an  outpatient procedure topical wound care for her chest tube sites    Mikey Bussing, MD [facility] [phone]

## 2017-06-04 ENCOUNTER — Ambulatory Visit: Payer: Self-pay | Admitting: Adult Health

## 2017-06-05 ENCOUNTER — Emergency Department (HOSPITAL_COMMUNITY): Payer: Medicare Other

## 2017-06-05 ENCOUNTER — Emergency Department (HOSPITAL_COMMUNITY)
Admission: EM | Admit: 2017-06-05 | Discharge: 2017-06-05 | Disposition: A | Discharge status: Home / Self Care | Payer: Medicare Other | Attending: Emergency Medicine | Admitting: Emergency Medicine

## 2017-06-05 ENCOUNTER — Telehealth: Payer: Self-pay | Admitting: Pulmonary Disease

## 2017-06-05 ENCOUNTER — Encounter (HOSPITAL_COMMUNITY): Payer: Self-pay | Admitting: *Deleted

## 2017-06-05 DIAGNOSIS — R0602 Shortness of breath: Secondary | ICD-10-CM | POA: Diagnosis not present

## 2017-06-05 DIAGNOSIS — Z79899 Other long term (current) drug therapy: Secondary | ICD-10-CM | POA: Insufficient documentation

## 2017-06-05 DIAGNOSIS — J45909 Unspecified asthma, uncomplicated: Secondary | ICD-10-CM | POA: Diagnosis not present

## 2017-06-05 DIAGNOSIS — Z87891 Personal history of nicotine dependence: Secondary | ICD-10-CM | POA: Insufficient documentation

## 2017-06-05 DIAGNOSIS — R9431 Abnormal electrocardiogram [ECG] [EKG]: Secondary | ICD-10-CM | POA: Diagnosis not present

## 2017-06-05 LAB — BASIC METABOLIC PANEL
ANION GAP: 13 (ref 5–15)
BUN: 7 mg/dL (ref 6–20)
CHLORIDE: 103 mmol/L (ref 101–111)
CO2: 22 mmol/L (ref 22–32)
Calcium: 9.5 mg/dL (ref 8.9–10.3)
Creatinine, Ser: 1.22 mg/dL — ABNORMAL HIGH (ref 0.44–1.00)
GFR calc Af Amer: 52 mL/min — ABNORMAL LOW (ref >60)
GFR, EST NON AFRICAN AMERICAN: 44 mL/min — AB (ref >60)
GLUCOSE: 185 mg/dL — AB (ref 65–99)
POTASSIUM: 3.4 mmol/L — AB (ref 3.5–5.1)
Sodium: 138 mmol/L (ref 135–145)

## 2017-06-05 LAB — CBC
HEMATOCRIT: 35.1 % — AB (ref 36.0–46.0)
HEMOGLOBIN: 11.1 g/dL — AB (ref 12.0–15.0)
MCH: 29.2 pg (ref 26.0–34.0)
MCHC: 31.6 g/dL (ref 30.0–36.0)
MCV: 92.4 fL (ref 78.0–100.0)
Platelets: 267 10*3/uL (ref 150–400)
RBC: 3.8 MIL/uL — ABNORMAL LOW (ref 3.87–5.11)
RDW: 13.5 % (ref 11.5–15.5)
WBC: 6.7 10*3/uL (ref 4.0–10.5)

## 2017-06-05 LAB — TROPONIN I: Troponin I: <0.03 ng/mL (ref <0.03)

## 2017-06-05 LAB — I-STAT TROPONIN, ED: TROPONIN I, POC: 0 ng/mL (ref 0.00–0.08)

## 2017-06-05 MED ORDER — IOPAMIDOL (ISOVUE-370) INJECTION 76%
INTRAVENOUS | Status: AC
  Administered 2017-06-05: 100 mL
  Filled 2017-06-05: qty 100

## 2017-06-05 NOTE — Discharge Instructions (Signed)
You were evaluated for acute onset palpitations and difficulty breathing. Your labwork and physical exam were reassuring that you did not have a heart attack. Your CT scan did not reveal any blood clots in your lungs. Your imaging did not show a pneumonia or lung collapse that could be causing your symptoms. It is possible that your symptoms are a result of a flare of your chronic lung disease. Please follow up with your pulmonary doctor as already scheduled regarding your rsymptoms of shortness of breath and palpitations that worsen with exertion. You should also follow up with your primary care physician regarding your ED visit so that they can work with your pulmonologist to manage symptoms of chronic lung disease.  In the meantime, when walking around your house you should use 3L of oxygen instead of 2L, as your lung doctors noted that you need more oxygen while walking around to keep the oxygen in your blood high. Please seek care if you develop fevers, worsening productive cough, radiating chest pain with exertion, or progressively worsening shortness of breath at rest.

## 2017-06-05 NOTE — ED Provider Notes (Signed)
[facility]ENCY DEPT Provider Note   CSN: [id number] Arrival date & time: 06/05/17  1613   History   Chief Complaint Chief Complaint  Patient presents with  . Shortness of Breath    HPI DELOMA SPINDLE is a 66 y.o. female.  Patient with PMH of chronic respiratory failure with hypoxia (on 2L O2 at baseline, 3L with exertion), pulmonary sarcoidosis, GERD, and recent hospitalization for R pneumomothorax who is presenting for evaluation of acute onset palpitations, dizziness, and worsening shortness of breath with exertion. Per chart review the patient was recently discharged from the hospital at the beginning of the month after a prolonged stay for management of acute onset R pneumothorax. She has been doing well since this hospitalization until last night when she all of the sudden started to "feel weird". She stays she didn't feel well all day, endorsing increased fatigue and decreased appetite. She stayed in bed all day and decided to get up to fix herself something for dinner. Since she was walking a short distance, the patient did not increase her oxygen levels. When she was walking around she felt increased palpitations, dizziness, tingling in her extremities, and worsening shortness of breath. She noticed her oxygen level drop to 83% on home monitor and her heart rate jumped into the 140s. Her vital signs settled down after resting, but she has continued to feel this constellation of symptoms every time she moves around. She also noticed increased pleuritic chest pain on her R side, stating that every time she takes a deep breath she feels pain where her chest tube was previously placed.   She denies abdominal pain, dysuria, lower extremity edema, hemoptysis, and worsening of chronic productive cough.      Past Medical History:  Diagnosis Date  . Anxiety   . Asthma   . Barrett esophagus   . GERD (gastroesophageal reflux disease)   . Hiatal hernia   . IBS (irritable bowel syndrome)     . Migraines   . Pulmonary sarcoidosis [facility])     Patient Active Problem List   Diagnosis Date Noted  . Other chest pain   . Goals of care, counseling/discussion   . Palliative care by specialist   . Pneumothorax on right 04/18/2017  . Spontaneous pneumothorax 04/18/2017  . SOB (shortness of breath) 06/05/2016  . Osteopenia 02/12/2016  . Lumbar radiculopathy 01/26/2015  . Chronic respiratory failure with hypoxia (HCC) 04/12/2014  . Obesity (BMI 30-39.9) 02/08/2014  . Allergic rhinitis 09/05/2010  . IBS 09/05/2010  . GERD 02/18/2010  . Barrett's esophagus 02/12/2010  . Upper airway cough syndrome 11/02/2009  . Anxiety state 08/29/2008  . History of colonic polyps 08/29/2008  . PULMONARY SARCOIDOSIS 08/12/2007  . Mild persistent asthma in adult without complication with component of vcd 08/12/2007  . Diaphragmatic hernia 08/12/2007  . Migraine 08/12/2007    Past Surgical History:  Procedure Laterality Date  . BRAVO PH STUDY N/A 10/10/2013   Procedure: BRAVO PH STUDY;  Surgeon: Hart Carwin, MD;  Location: [facility] ENDOSCOPY;  Service: Endoscopy;  Laterality: N/A;  placed 27  . BRONCHIAL BIOPSY  1996  . CATARACT EXTRACTION, BILATERAL    . CHEST TUBE INSERTION Right 04/23/2017   Procedure: CHEST TUBE INSERTION;  Surgeon: [name], MD;  Location: [facility] OR;  Service: Thoracic;  Laterality: Right;  . CHOLECYSTECTOMY    . ESOPHAGOGASTRODUODENOSCOPY N/A 10/10/2013   Procedure: ESOPHAGOGASTRODUODENOSCOPY (EGD);  Surgeon: Hart Carwin, MD;  Location: [facility];  Service: Endoscopy;  Laterality: N/A;  pt asthmatic, RA 92% at rest.  Coughing frequently. Wheezes noted in upper lung fields at auscultation. Pt instructed to use home inhaler prior to procedure and placed on supportive O2 @ 2 lpm in pre-procedural. Teaching done ie. use of inhaler. Pt states she ju  . NISSEN FUNDOPLICATION    . ROTATOR CUFF REPAIR     x3(2 on L, 1 on R)  . VIDEO BRONCHOSCOPY WITH INSERTION OF INTERBRONCHIAL  VALVE (IBV) N/A 05/08/2017   Procedure: VIDEO BRONCHOSCOPY WITH INSERTION OF INTERBRONCHIAL VALVE (IBV);  Surgeon: Loreli Slot, MD;  Location: [facility] OR;  Service: Thoracic;  Laterality: N/A;    OB History    No data available      Home Medications    Prior to Admission medications   Medication Sig Start Date End Date Taking? Authorizing Provider  albuterol (VENTOLIN HFA) 108 (90 Base) MCG/ACT inhaler Inhale 2 puffs into the lungs as needed. Patient taking differently: Inhale 2 puffs into the lungs every 4 (four) hours as needed for wheezing or shortness of breath.  12/11/16  Yes Copland, Gwenlyn Found, MD  ALOE VERA JUICE PO Take 120 mLs by mouth daily.    Yes [provider]  Ascorbic Acid (VITAMIN C PO) Take 1 tablet by mouth daily.   Yes [provider]  ASTRAGALUS PO Take 1 tablet by mouth daily.    Yes [provider]  budesonide-formoterol (SYMBICORT) 160-4.5 MCG/ACT inhaler Inhale 2 puffs into the lungs 2 (two) times daily. 05/21/17  Yes Nyoka Cowden, MD  buPROPion (WELLBUTRIN XL) 300 MG 24 hr tablet Take 1 tablet (300 mg total) by mouth daily. Patient taking differently: Take 300 mg by mouth daily before breakfast.  12/12/16  Yes Copland, Gwenlyn Found, MD  Calcium Carbonate-Vitamin D (CALCIUM-D PO) Take 1 tablet by mouth daily.   Yes [provider]  Collagen Hydrolysate POWD Take 1 scoop by mouth daily with supper.    Yes [provider]  Cyanocobalamin (VITAMIN B-12) 5000 MCG TBDP Take 5,000 mcg by mouth daily.   Yes [provider]  cycloSPORINE (RESTASIS) 0.05 % ophthalmic emulsion Place 1 drop into both eyes 2 (two) times daily.    Yes [provider]  fluticasone (FLONASE) 50 MCG/ACT nasal spray Place 1 spray into both nostrils 2 (two) times daily.    Yes [provider]  furosemide (LASIX) 20 MG tablet TAKE 1 TABLET (20 MG TOTAL) BY MOUTH DAILY. USE AS NEEDED FOR SWELLING Patient taking differently: Take 20  mg by mouth daily as needed (swelling).  09/18/16  Yes Copland, Gwenlyn Found, MD  GuaiFENesin (MUCINEX PO) Take 1 tablet by mouth every 12 (twelve) hours as needed (cough).   Yes [provider]  ipratropium (ATROVENT HFA) 17 MCG/ACT inhaler Inhale 2 puffs into the lungs every 6 (six) hours as needed for wheezing. 06/27/16  Yes Saguier, Ramon Dredge, PA-C  loratadine (CLARITIN) 10 MG tablet Take 10 mg by mouth daily.   Yes [provider]  LORazepam (ATIVAN) 0.5 MG tablet TAKE 1/2 TO 1 TABLET BY MOUTH EVERY 8 TO 12 HRS ONLY AS NEEDED Patient taking differently: TAKE 1/2 TO 1 TABLET (0.25 - 0.5 MG) BY MOUTH EVERY 8 TO 12 HRS ONLY AS NEEDED FOR PANIC ATTACKS 09/18/16  Yes Copland, Gwenlyn Found, MD  Multiple Vitamin (MULTIVITAMIN WITH MINERALS) TABS tablet Take 1 tablet by mouth daily.   Yes [provider]  Nasal Moisturizer Combination (RHINASE) SOLN Place 2 sprays into the nose 2 (two)  times daily as needed (dry, bloody nostrils).    Yes [provider]  Omega 3-6-9 Fatty Acids (OMEGA 3-6-9 COMPLEX PO) Take 1 capsule by mouth daily.   Yes [provider]  OXYGEN Inhale 2 L into the lungs continuous.   Yes [provider]  pantoprazole (PROTONIX) 20 MG tablet TAKE 1 TABLET (20 MG TOTAL) BY MOUTH DAILY. Patient taking differently: TAKE 1 TABLET (20 MG TOTAL) BY MOUTH DAILY BEFORE BREAKFAST 03/16/17  Yes Armbruster, Willaim Rayas, MD  polyvinyl alcohol (ARTIFICIAL TEARS) 1.4 % ophthalmic solution Place 1 drop into both eyes See admin instructions. Instill 1 drop in to both eyes once or twice daily as needed for dry eyes - in between restasis doses   Yes [provider]  PREBIOTIC PRODUCT PO Take 1 scoop by mouth daily before breakfast.    Yes [provider]  ranitidine (ZANTAC) 150 MG tablet TAKE 1 TABLET (150 MG TOTAL) BY MOUTH 2 (TWO) TIMES DAILY. 05/18/17  Yes Armbruster, Willaim Rayas, MD  chlorpheniramine-HYDROcodone (TUSSIONEX) 10-8 MG/5ML SUER Take 5  mLs by mouth every 12 (twelve) hours. Patient not taking: Reported on 06/05/2017 05/12/17   Kathlen Mody, MD  SYMBICORT 160-4.5 MCG/ACT inhaler INHALE 2 PUFF INTO THE LUNGS TWICE A DAY AS NEEDED Patient not taking: Reported on 06/05/2017 03/30/17   Oretha Milch, MD    Family History Family History  Problem Relation Age of Onset  . Adopted: Yes    Social History Social History  Substance Use Topics  . Smoking status: Former Smoker    Packs/day: 1.00    Years: 20.00    Types: Cigarettes    Quit date: 09/08/1978  . Smokeless tobacco: Never Used  . Alcohol use No     Allergies   Gabapentin; Lactose intolerance (gi); Penicillins; Pineapple; and Aspirin   Review of Systems Review of Systems  Constitutional: Positive for activity change, appetite change, diaphoresis and fatigue.  HENT: Positive for congestion (chronic). Negative for rhinorrhea, sinus pain, sinus pressure, sneezing and sore throat.   Respiratory: Positive for cough (chronic) and shortness of breath (acute worsening of chronic SOB).   Cardiovascular: Positive for chest pain (R sided) and palpitations. Negative for leg swelling.  Gastrointestinal: Negative for abdominal pain, constipation, diarrhea, nausea and vomiting.  Genitourinary: Negative for dysuria, flank pain, frequency, hematuria and urgency.  Neurological: Positive for light-headedness and headaches. Negative for syncope, speech difficulty, weakness and numbness.    Physical Exam Updated Vital Signs BP (!) 124/92   Pulse (!) 107   Temp 97.8 F (36.6 C) (Oral)   Resp (!) 22   Ht  (1.651 m)   Wt 92.1 kg (203 lb)   SpO2 98%   BMI 33.78 kg/m   Physical Exam  Constitutional:  Obese woman sitting comfortably in bed with [place] in place in no acute distress.  HENT:  Mouth/Throat: Oropharynx is clear and moist.  Eyes: Conjunctivae and EOM are normal.  Cardiovascular: Regular rhythm and intact distal pulses.  Exam reveals no friction rub.   No murmur  heard. Tachycardic on exam  Pulmonary/Chest: Effort normal. She exhibits tenderness (R chest wall).  No signs of cyanosis. No nasal flaring or accessory muscle use. Scattered crackles and intermittent inspiratory wheezing throughout all lung fields.   Abdominal: Soft. She exhibits no distension and no mass. There is no tenderness. There is no guarding.  Musculoskeletal: She exhibits no edema (of bilateral lower extremities) or tenderness (of bilateral lower extremities).  Lymphadenopathy:  She has no cervical adenopathy.  Skin: Skin is warm and dry. No rash noted. No erythema.    ED Treatments / Results  Labs (all labs ordered are listed, but only abnormal results are displayed) Labs Reviewed  BASIC METABOLIC PANEL - Abnormal; Notable for the following:       Result Value   Potassium 3.4 (*)    Glucose, Bld 185 (*)    Creatinine, Ser 1.22 (*)    GFR calc non Af Amer 44 (*)    GFR calc Af Amer 52 (*)    All other components within normal limits  CBC - Abnormal; Notable for the following:    RBC 3.80 (*)    Hemoglobin 11.1 (*)    HCT 35.1 (*)    All other components within normal limits  TROPONIN I  TROPONIN I  I-STAT TROPONIN, ED    EKG  EKG Interpretation  Date/Time:  Friday June 05 2017 16:15:49 EDT Ventricular Rate:  137 PR Interval:    QRS Duration: 88 QT Interval:  372 QTC Calculation: 561 R Axis:   75 Text Interpretation:  Supraventricular tachycardia Nonspecific ST and T wave abnormality Abnormal ECG v5 with likely t wave at normal locatio, likely measuring p as t Otherwise no significant change Confirmed by Melene Plan [phone]) on 06/05/2017 5:16:55 PM       Radiology Dg Chest 2 View  Result Date: 06/05/2017 CLINICAL DATA:  Shortness of breath and tachycardia. History of chest tube. EXAM: CHEST  2 VIEW COMPARISON:  Chest radiograph May 27, 2017 and CT chest April 19, 2017 FINDINGS: Cardiomediastinal silhouette is normal. No pleural effusions or  focal consolidations. Chronic interstitial lung disease, stable from prior imaging. Trachea projects midline and there is no pneumothorax. Small wire like radiopaque foreign body projects the RIGHT hilum, likely reflecting inter bronchial valve replacement. Soft tissue planes and included osseous structures are non-suspicious. Suture anchors RIGHT humeral head. Surgical clips in the included right abdomen compatible with cholecystectomy. IMPRESSION: Stable examination of chronic interstitial lung disease. Electronically Signed   By: Awilda Metro M.D.   On: 06/05/2017 17:30   Ct Angio Chest Pe W/cm &/or Wo Cm  Result Date: 06/05/2017 CLINICAL DATA:  Shortness of breath with hypoxemia and palpitations today. Evaluate for pulmonary embolism. EXAM: CT ANGIOGRAPHY CHEST WITH CONTRAST TECHNIQUE: Multidetector CT imaging of the chest was performed using the standard protocol during bolus administration of intravenous contrast. Multiplanar CT image reconstructions and MIPs were obtained to evaluate the vascular anatomy. CONTRAST:  70 ml Isovue 370. COMPARISON:  Radiographs today.  Chest CT 04/19/2017. FINDINGS: Cardiovascular: The pulmonary arteries are well opacified with contrast to the level of the subsegmental branches. There is no evidence of acute pulmonary embolism. There is central enlargement of the pulmonary arteries consistent with pulmonary arterial hypertension. The thoracic aorta and great vessels appear unremarkable. The heart size is normal. There is no pericardial effusion. Mediastinum/Nodes: Multiple enlarged mediastinal and hilar lymph nodes are again noted, including a 15 mm AP window node on image 33 of series 5 and 824 mm subcarinal node on image 66. There is no axillary lymphadenopathy. The thyroid gland, trachea and esophagus demonstrate no significant findings. Lungs/Pleura: There is no pleural effusion. Right chest tube has been removed. There is no recurrent pneumothorax or soft tissue  emphysema. Right upper lobe intrabronchial valve is noted. Multifocal bronchiectasis, honeycomb formation and subpleural reticulation is again noted. No superimposed airspace disease identified. Upper abdomen: Stable postsurgical changes in the upper abdomen.  No acute findings. Musculoskeletal/Chest wall: There is no chest wall mass or suspicious osseous finding. Review of the MIP images confirms the above findings. IMPRESSION: 1. No evidence of acute pulmonary embolism. 2. No evidence of recurrent pneumothorax following right upper lobe intrabronchial valve placement. 3. Stable chronic lung disease attributed to chronic sarcoidosis. Associated mediastinal and hilar adenopathy. 4. Central enlargement of the pulmonary arteries consistent with pulmonary arterial hypertension. Electronically Signed   By: Carey Bullocks M.D.   On: 06/05/2017 20:04   Procedures Procedures (including critical care time)  Medications Ordered in ED Medications  iopamidol (ISOVUE-370) 76 % injection (100 mLs  Contrast Given 06/05/17 1928)     Initial Impression / Assessment and Plan / ED Course  I have reviewed the triage vital signs and the nursing notes.  Pertinent labs & imaging results that were available during my care of the patient were reviewed by me and considered in my medical decision making (see chart for details).  Patient with PMH of chronic respiratory failure 2/2 pulmonary sarcoidosis and recent hospitalization for R pneumothorax presents with acute onset palpitations, SOB, and oxygen desaturations at home. Patient's initial troponin was negative and her EKG showed sinus tachycardia. Patient's initial chest X ray did not show evidence of recurrent pneumothorax or new pneumonia that could be causing her symptoms.   Given recent hospitalization, tachycardia, and tachypnea on exam, I am most worried about pulmonary embolism. Her Well's score is 3.0 points, making her at moderate risk (>15%) for PE in ED  population. Will obtain CT angio to evaluate for this condition.   It is possible that the patient's symptoms are a result acute worsening of pulmonary sarcoidosis. If above studies return normal, patient will be encourage to follow up as scheduled with pulmonologist Dr. Vassie Loll later next week for evaluation of chronic lung disease  Final Clinical Impressions(s) / ED Diagnoses   Final diagnoses:  Shortness of breath   Patient's follow up troponin negative and CT angiography did not reveal acute pulmonary embolisms. Patient had chronic mediastinal lymphadenopathy and lung changes consistent with known sarcoidosis. It is possible that the patient's symptoms are a result of worsening underlying lung disease. Patient did have elevated pulmonary arteries which may be consistent with this diagnosis. Patient instructed to follow up with pulmonologist as planned next week. Patient also instructed to ensure she increases oxygen when moving around, as instructed by PCP and pulmonologist. It appears that her symptoms worsen when she does not use oxygen as prescribed.  Patients vital signs stable and patient will be discharged with plans for close follow up. Return precautions given.    New Prescriptions New Prescriptions   No medications on file     Rozann Lesches, MD 06/05/17 2036    Rozann Lesches, MD 06/05/17 2039    Melene Plan, DO 06/05/17 2041

## 2017-06-05 NOTE — ED Notes (Signed)
MD at bedside. 

## 2017-06-05 NOTE — ED Notes (Signed)
Pt provided with apple sauce, peanut butter, and apple juice.

## 2017-06-05 NOTE — ED Triage Notes (Signed)
The npt is c/o some sob today with lower sats than usual and faster heart rate... She was recently in the hospital for pes   She reports that she was here 2 months.  She was discharged from the hospital mid month sept

## 2017-06-05 NOTE — Telephone Encounter (Signed)
Left message for patient to call back to get scheduled to see RA in [place] next Thursday 06/11/17.

## 2017-06-05 NOTE — Progress Notes (Deleted)
Subjective:   [name] is a 65 y.o. female who presents for Medicare Annual (Subsequent) preventive examination.  Review of Systems:  No ROS.  Medicare Wellness Visit. Additional risk factors are reflected in the social history.    Sleep patterns:    Home Safety/Smoke Alarms: Feels safe in home. Smoke alarms in place.  Living environment; residence and Firearm Safety:  Seat Belt Safety/Bike Helmet: Wears seat belt.   Female:   Pap- last 09/03/16:norma   Mammo-    Last 02/19/16-normal   Dexa scan-   Last 02/12/16-osteopenia     CCS- last 12/17/15: cologuard-negative    Objective:     Vitals: There were no vitals taken for this visit.  There is no height or weight on file to calculate BMI.   Tobacco History  Smoking Status  . Former Smoker  . Packs/day: 1.00  . Years: 20.00  . Types: Cigarettes  . Quit date: 09/08/1978  Smokeless Tobacco  . Never Used     Counseling given: Not Answered   Past Medical History:  Diagnosis Date  . Anxiety   . Asthma   . Barrett esophagus   . GERD (gastroesophageal reflux disease)   . Hiatal hernia   . IBS (irritable bowel syndrome)   . Migraines   . Pulmonary sarcoidosis [facility])    Past Surgical History:  Procedure Laterality Date  . BRAVO PH STUDY N/A 10/10/2013   Procedure: BRAVO PH STUDY;  Surgeon: Hart Carwin, MD;  Location: [facility];  Service: Endoscopy;  Laterality: N/A;  placed 29  . BRONCHIAL BIOPSY  1996  . CATARACT EXTRACTION, BILATERAL    . CHEST TUBE INSERTION Right 04/23/2017   Procedure: CHEST TUBE INSERTION;  Surgeon: [name], MD;  Location: [facility] OR;  Service: Thoracic;  Laterality: Right;  . CHOLECYSTECTOMY    . ESOPHAGOGASTRODUODENOSCOPY N/A 10/10/2013   Procedure: ESOPHAGOGASTRODUODENOSCOPY (EGD);  Surgeon: Hart Carwin, MD;  Location: [facility];  Service: Endoscopy;  Laterality: N/A;  pt asthmatic, RA 92% at rest.  Coughing frequently. Wheezes noted in upper lung fields at auscultation. Pt  instructed to use home inhaler prior to procedure and placed on supportive O2 @ 2 lpm in pre-procedural. Teaching done ie. use of inhaler. Pt states she ju  . NISSEN FUNDOPLICATION    . ROTATOR CUFF REPAIR     x3(2 on L, 1 on R)  . VIDEO BRONCHOSCOPY WITH INSERTION OF INTERBRONCHIAL VALVE (IBV) N/A 05/08/2017   Procedure: VIDEO BRONCHOSCOPY WITH INSERTION OF INTERBRONCHIAL VALVE (IBV);  Surgeon: Loreli Slot, MD;  Location: [facility] OR;  Service: Thoracic;  Laterality: N/A;   Family History  Problem Relation Age of Onset  . Adopted: Yes   History  Sexual Activity  . Sexual activity: Not on file    Outpatient Encounter Prescriptions as of 06/09/2017  Medication Sig  . albuterol (VENTOLIN HFA) 108 (90 Base) MCG/ACT inhaler Inhale 2 puffs into the lungs as needed. (Patient taking differently: Inhale 2 puffs into the lungs every 4 (four) hours as needed for wheezing or shortness of breath. )  . ALOE VERA JUICE PO Take 120 mLs by mouth daily.   . Artificial Tear Ointment (DRY EYES OP) Place 1 drop into both eyes daily as needed (dry eye). Use in between  Restatis  . Ascorbic Acid (VITAMIN C PO) Take 1 tablet by mouth daily.  . ASTRAGALUS PO Take 1 tablet by mouth daily.   . budesonide-formoterol (SYMBICORT) 160-4.5 MCG/ACT inhaler Inhale 2 puffs  into the lungs 2 (two) times daily.  Marland Kitchen buPROPion (WELLBUTRIN XL) 300 MG 24 hr tablet Take 1 tablet (300 mg total) by mouth daily. (Patient taking differently: Take 300 mg by mouth daily before breakfast. )  . Calcium Carbonate-Vitamin D (CALCIUM-D PO) Take 1 tablet by mouth daily.  . chlorpheniramine-HYDROcodone (TUSSIONEX) 10-8 MG/5ML SUER Take 5 mLs by mouth every 12 (twelve) hours.  . Collagen Hydrolysate POWD Take 1 scoop by mouth daily with supper.   . Cyanocobalamin (VITAMIN B-12) 5000 MCG TBDP Take 5,000 mcg by mouth daily.  . cycloSPORINE (RESTASIS) 0.05 % ophthalmic emulsion Place 1 drop into both eyes 2 (two) times daily.   . fluticasone  (FLONASE) 50 MCG/ACT nasal spray Place 1 spray into both nostrils 3 (three) times daily.  . furosemide (LASIX) 20 MG tablet TAKE 1 TABLET (20 MG TOTAL) BY MOUTH DAILY. USE AS NEEDED FOR SWELLING (Patient taking differently: Take 20 mg by mouth daily as needed (swelling). )  . ipratropium (ATROVENT HFA) 17 MCG/ACT inhaler Inhale 2 puffs into the lungs every 6 (six) hours as needed for wheezing.  Marland Kitchen loratadine (CLARITIN) 10 MG tablet Take 10 mg by mouth daily.  Marland Kitchen LORazepam (ATIVAN) 0.5 MG tablet TAKE 1/2 TO 1 TABLET BY MOUTH EVERY 8 TO 12 HRS ONLY AS NEEDED (Patient taking differently: TAKE 1/2 TO 1 TABLET (0.25 - 0.5 MG) BY MOUTH EVERY 8 TO 12 HRS ONLY AS NEEDED FOR PANIC ATTACKS)  . Multiple Vitamin (MULTIVITAMIN WITH MINERALS) TABS tablet Take 1 tablet by mouth daily.  . Nasal Moisturizer Combination (RHINASE) SOLN Place 2 sprays into the nose 2 (two) times daily as needed (dry, bloody nostrils).   . Omega 3-6-9 Fatty Acids (OMEGA 3-6-9 COMPLEX PO) Take 1 capsule by mouth daily.  . OXYGEN Inhale 2 L into the lungs continuous.  . pantoprazole (PROTONIX) 20 MG tablet TAKE 1 TABLET (20 MG TOTAL) BY MOUTH DAILY.  Marland Kitchen PREBIOTIC PRODUCT PO Take 1 scoop by mouth daily before breakfast.   . ranitidine (ZANTAC) 150 MG tablet TAKE 1 TABLET (150 MG TOTAL) BY MOUTH 2 (TWO) TIMES DAILY.  . SYMBICORT 160-4.5 MCG/ACT inhaler INHALE 2 PUFF INTO THE LUNGS TWICE A DAY AS NEEDED (Patient taking differently: INHALE 2 PUFFS INTO THE LUNGS EVERY MORNING, MAY ALSO USE IN THE EVENING AS NEEDED FOR SHORTNESS OF BREATH)   No facility-administered encounter medications on file as of 06/09/2017.     Activities of Daily Living In your present state of health, do you have any difficulty performing the following activities: 04/20/2017 04/20/2017  Hearing? N N  Vision? N N  Difficulty concentrating or making decisions? N N  Walking or climbing stairs? N N  Dressing or bathing? N N  Doing errands, shopping? - N  Some recent data  might be hidden    Patient Care Team: Copland, Gwenlyn Found, MD as PCP - General (Family Medicine) Nelson Chimes, MD as Consulting Physician (Ophthalmology) Nyoka Cowden, MD as Consulting Physician (Pulmonary Disease) Armbruster, Willaim Rayas, MD as Consulting Physician (Gastroenterology) Newman Pies, MD as Consulting Physician (Otolaryngology)    Assessment:    Physical assessment deferred to PCP.  Exercise Activities and Dietary recommendations    Diet (meal preparation, eat out, water intake, caffeinated beverages, dairy products, fruits and vegetables): {Desc; diets:16563} Breakfast: Lunch:  Dinner:      Goals    . Increase physical activity          As tolerated. Begin chair exercises as tolerated.    Marland Kitchen  Patient Stated          Get back to normal weight of 145-150 lbs.      Fall Risk Fall Risk  09/03/2016 03/04/2016 02/07/2016 06/19/2014  Falls in the past year? No No No No  Comment - Pt does report she sometimes gets tangled in O2 tubing, but has not fallen. - -  Risk for fall due to : - Other (Comment) - -  Risk for fall due to: Comment - Pt on continuous O2 at home and has long O2 extension tubing. - -   Depression Screen PHQ 2/9 Scores 09/03/2016 03/04/2016 02/07/2016 12/12/2014  PHQ - 2 Score 0 0 0 1     Cognitive Function MMSE - Mini Mental State Exam 03/04/2016  Orientation to time 5  Orientation to Place 5  Registration 3  Attention/ Calculation 5  Recall 3  Language- name 2 objects 2  Language- repeat 1  Language- follow 3 step command 3  Language- read & follow direction 1  Write a sentence 1  Copy design 1  Total score 30        Immunization History  Administered Date(s) Administered  . Influenza Split 07/29/2012  . Influenza Whole 07/09/2002, 07/09/2009, 06/20/2010  . Influenza, High Dose Seasonal PF 06/03/2016  . Influenza,inj,Quad PF,6+ Mos 07/11/2013, 06/29/2014  . Pneumococcal Conjugate-13 06/29/2014  . Pneumococcal Polysaccharide-23 07/09/2002,  12/14/2009, 03/04/2016  . Td 09/05/2010   Screening Tests Health Maintenance  Topic Date Due  . INFLUENZA VACCINE  04/08/2017  . MAMMOGRAM  02/18/2018  . Fecal DNA (Cologuard)  12/17/2018  . TETANUS/TDAP  09/05/2020  . DEXA SCAN  Completed  . Hepatitis C Screening  Completed  . PNA vac Low Risk Adult  Completed      Plan:   ***   I have personally reviewed and noted the following in the patient's chart:   . Medical and social history . Use of alcohol, tobacco or illicit drugs  . Current medications and supplements . Functional ability and status . Nutritional status . Physical activity . Advanced directives . List of other physicians . Hospitalizations, surgeries, and ER visits in previous 12 months . Vitals . Screenings to include cognitive, depression, and falls . Referrals and appointments  In addition, I have reviewed and discussed with patient certain preventive protocols, quality metrics, and best practice recommendations. A written personalized care plan for preventive services as well as general preventive health recommendations were provided to patient.     Mady Haagensen [place], [place]  06/05/2017

## 2017-06-09 ENCOUNTER — Ambulatory Visit: Payer: Self-pay | Admitting: *Deleted

## 2017-06-11 ENCOUNTER — Ambulatory Visit (INDEPENDENT_AMBULATORY_CARE_PROVIDER_SITE_OTHER): Payer: Medicare Other | Admitting: Pulmonary Disease

## 2017-06-11 ENCOUNTER — Encounter: Payer: Self-pay | Admitting: Pulmonary Disease

## 2017-06-11 DIAGNOSIS — J9611 Chronic respiratory failure with hypoxia: Secondary | ICD-10-CM

## 2017-06-11 DIAGNOSIS — J9383 Other pneumothorax: Secondary | ICD-10-CM

## 2017-06-11 DIAGNOSIS — D869 Sarcoidosis, unspecified: Secondary | ICD-10-CM

## 2017-06-11 NOTE — Patient Instructions (Signed)
Check ACE level Referral to pulmonary rehab program Ambulation to advise about oxygen level on walking

## 2017-06-11 NOTE — Assessment & Plan Note (Signed)
Questionable this is still active-imaging is more suggestive of burned out sarcoidosis with fibrosis and honeycombing  We'll check ACE level for completion She is off prednisone now, and continue Symbicort as needed  She will certainly benefit from pulmonary rehabilitation program since her deconditioning has increased

## 2017-06-11 NOTE — Assessment & Plan Note (Signed)
Plan is for removal of endobronchial valves within the next month. Not Clear to me if she underwent pleurodesis or not on the right side. She is clearly at some risk for recurrent pneumothorax in the future

## 2017-06-11 NOTE — Assessment & Plan Note (Signed)
All her questions were answered She is not a great transplant candidate She will need 4 L of oxygen on ambulation and 2 L at rest

## 2017-06-11 NOTE — Progress Notes (Signed)
   Subjective:    Patient ID: [name], female    DOB: 19-Feb-1951, 66 y.o.   MRN: [id number]  HPI  [id number] For FU of  mild persistent asthma and sarcoidosis -quit smoking in 1980 and dx with sarcoidosis in 1996 She has been maintained on oxygen since 2015  Interim -She had a prolonged hospitalization 04/2017 due to spontaneous right pneumothorax with persistent air leak. This finally resolved after placement of endobronchial valves by Dr. Dorris Fetch. Not clear to me that pleurodesis was performed. She was seen by my partner Dr. Sherene Sires on 9/13 and advised to go to the ER for tachycardia, ER evaluation did not reveal any cardiac issues  She has several questions and concerns today -About ACE level not being checked - Level of oxygen that she should be on while walking -Whether sarcoid is active or not  She feels reflux is controlled on omeprazole I reviewed her CT angiogram from 9/28 which again shows chronic lung disease with honeycombing, reticulation bronchiectasis. No superimposed airspace disease of groundglass She was on 5 mg prednisone during her last visit with me in 01/2017, she has been off of this since July  She desaturated to 86% on 3 L oxygen on the third lap  Significant tests/ events  Pfts 08/22/2013 nl lung vols, dlco 53 corrects to 73% - PFTs 05/16/2014 Nl lung vols, DLCO 42% corrects to 67%   PFT 08/2017  shows FEV1 88%, ratio 87, FVC 78%, ++BD repsonse , DLCO 35% (down from 42%)  CT angio 05/2016 >>Diffuse mediastinal and bilateral hilar lymphadenopathy, Diffuse honeycombing and scarring   2-D echo on December 7 showed an EF of 60-65%, grade 1 diastolic dysfunction, pulmonary artery pressure 30 [email or web address] dilated aortic root  UGI 10/2013 > Trace reflux  Past Medical History:  Diagnosis Date  . Anxiety   . Asthma   . Barrett esophagus   . GERD (gastroesophageal reflux disease)   . Hiatal hernia   . IBS (irritable bowel syndrome)   . Migraines     . Pulmonary sarcoidosis (HCC)      Review of Systems neg for any significant sore throat, dysphagia, itching, sneezing, nasal congestion or excess/ purulent secretions, fever, chills, sweats, unintended wt loss, pleuritic or exertional cp, hempoptysis, orthopnea pnd or change in chronic leg swelling.   Also denies presyncope, palpitations, heartburn, abdominal pain, nausea, vomiting, diarrhea or change in bowel or urinary habits, dysuria,hematuria, rash, arthralgias, visual complaints, headache, numbness weakness or ataxia.     Objective:   Physical Exam  Gen. Pleasant, obese, in no distress, normal affect ENT - no thrush, no post nasal drip, class 2 airway Neck: No JVD, no thyromegaly, no carotid bruits Lungs: no use of accessory muscles, no dullness to percussion, BL scattered  rales  Cardiovascular: Rhythm regular, heart sounds  normal, no murmurs or gallops, no peripheral edema Abdomen: soft and non-tender, no hepatosplenomegaly, BS normal. Musculoskeletal: No deformities, no cyanosis or clubbing Neuro:  alert, non focal, no tremors       Assessment & Plan:

## 2017-06-15 NOTE — Progress Notes (Deleted)
Subjective:   [name] is a 66 y.o. female who presents for Medicare Annual (Subsequent) preventive examination.  Review of Systems:  No ROS.  Medicare Wellness Visit. Additional risk factors are reflected in the social history.    Sleep patterns:  Home Safety/Smoke Alarms: Feels safe in home. Smoke alarms in place.  Living environment; residence and Firearm Safety:  Seat Belt Safety/Bike Helmet: Wears seat belt.   Female:   Pap-  Last 09/03/16 -normal   Mammo- last 02/19/16      Dexa scan-   Last 02/12/16  osteopenia  CCS- last 11/29/04 recall 10 yrs Objective:     Vitals: There were no vitals taken for this visit.  There is no height or weight on file to calculate BMI.   Tobacco History  Smoking Status  . Former Smoker  . Packs/day: 1.00  . Years: 20.00  . Types: Cigarettes  . Quit date: 09/08/1978  Smokeless Tobacco  . Never Used     Counseling given: Not Answered   Past Medical History:  Diagnosis Date  . Anxiety   . Asthma   . Barrett esophagus   . GERD (gastroesophageal reflux disease)   . Hiatal hernia   . IBS (irritable bowel syndrome)   . Migraines   . Pulmonary sarcoidosis [facility])    Past Surgical History:  Procedure Laterality Date  . BRAVO PH STUDY N/A 10/10/2013   Procedure: BRAVO PH STUDY;  Surgeon: Hart Carwin, MD;  Location: [facility];  Service: Endoscopy;  Laterality: N/A;  placed 29  . BRONCHIAL BIOPSY  1996  . CATARACT EXTRACTION, BILATERAL    . CHEST TUBE INSERTION Right 04/23/2017   Procedure: CHEST TUBE INSERTION;  Surgeon: [name], MD;  Location: [facility] OR;  Service: Thoracic;  Laterality: Right;  . CHOLECYSTECTOMY    . ESOPHAGOGASTRODUODENOSCOPY N/A 10/10/2013   Procedure: ESOPHAGOGASTRODUODENOSCOPY (EGD);  Surgeon: Hart Carwin, MD;  Location: [facility];  Service: Endoscopy;  Laterality: N/A;  pt asthmatic, RA 92% at rest.  Coughing frequently. Wheezes noted in upper lung fields at auscultation. Pt instructed to use home  inhaler prior to procedure and placed on supportive O2 @ 2 lpm in pre-procedural. Teaching done ie. use of inhaler. Pt states she ju  . NISSEN FUNDOPLICATION    . ROTATOR CUFF REPAIR     x3(2 on L, 1 on R)  . VIDEO BRONCHOSCOPY WITH INSERTION OF INTERBRONCHIAL VALVE (IBV) N/A 05/08/2017   Procedure: VIDEO BRONCHOSCOPY WITH INSERTION OF INTERBRONCHIAL VALVE (IBV);  Surgeon: Loreli Slot, MD;  Location: [facility] OR;  Service: Thoracic;  Laterality: N/A;   Family History  Problem Relation Age of Onset  . Adopted: Yes   History  Sexual Activity  . Sexual activity: Not on file    Outpatient Encounter Prescriptions as of 06/19/2017  Medication Sig  . albuterol (VENTOLIN HFA) 108 (90 Base) MCG/ACT inhaler Inhale 2 puffs into the lungs as needed. (Patient taking differently: Inhale 2 puffs into the lungs every 4 (four) hours as needed for wheezing or shortness of breath. )  . ALOE VERA JUICE PO Take 120 mLs by mouth daily.   . Ascorbic Acid (VITAMIN C PO) Take 1 tablet by mouth daily.  . ASTRAGALUS PO Take 1 tablet by mouth daily.   . budesonide-formoterol (SYMBICORT) 160-4.5 MCG/ACT inhaler Inhale 2 puffs into the lungs 2 (two) times daily.  Marland Kitchen buPROPion (WELLBUTRIN XL) 300 MG 24 hr tablet Take 1 tablet (300 mg total) by mouth daily. (  Patient taking differently: Take 300 mg by mouth daily before breakfast. )  . Calcium Carbonate-Vitamin D (CALCIUM-D PO) Take 1 tablet by mouth daily.  . Collagen Hydrolysate POWD Take 1 scoop by mouth daily with supper.   . Cyanocobalamin (VITAMIN B-12) 5000 MCG TBDP Take 5,000 mcg by mouth daily.  . cycloSPORINE (RESTASIS) 0.05 % ophthalmic emulsion Place 1 drop into both eyes 2 (two) times daily.   . fluticasone (FLONASE) 50 MCG/ACT nasal spray Place 1 spray into both nostrils 2 (two) times daily.   . furosemide (LASIX) 20 MG tablet TAKE 1 TABLET (20 MG TOTAL) BY MOUTH DAILY. USE AS NEEDED FOR SWELLING (Patient taking differently: Take 20 mg by mouth daily as  needed (swelling). )  . GuaiFENesin (MUCINEX PO) Take 1 tablet by mouth every 12 (twelve) hours as needed (cough).  Marland Kitchen ipratropium (ATROVENT HFA) 17 MCG/ACT inhaler Inhale 2 puffs into the lungs every 6 (six) hours as needed for wheezing.  Marland Kitchen loratadine (CLARITIN) 10 MG tablet Take 10 mg by mouth daily.  Marland Kitchen LORazepam (ATIVAN) 0.5 MG tablet TAKE 1/2 TO 1 TABLET BY MOUTH EVERY 8 TO 12 HRS ONLY AS NEEDED (Patient taking differently: TAKE 1/2 TO 1 TABLET (0.25 - 0.5 MG) BY MOUTH EVERY 8 TO 12 HRS ONLY AS NEEDED FOR PANIC ATTACKS)  . Multiple Vitamin (MULTIVITAMIN WITH MINERALS) TABS tablet Take 1 tablet by mouth daily.  . Nasal Moisturizer Combination (RHINASE) SOLN Place 2 sprays into the nose 2 (two) times daily as needed (dry, bloody nostrils).   . Omega 3-6-9 Fatty Acids (OMEGA 3-6-9 COMPLEX PO) Take 1 capsule by mouth daily.  . OXYGEN Inhale 2 L into the lungs continuous.  . pantoprazole (PROTONIX) 20 MG tablet TAKE 1 TABLET (20 MG TOTAL) BY MOUTH DAILY. (Patient taking differently: TAKE 1 TABLET (20 MG TOTAL) BY MOUTH DAILY BEFORE BREAKFAST)  . polyvinyl alcohol (ARTIFICIAL TEARS) 1.4 % ophthalmic solution Place 1 drop into both eyes See admin instructions. Instill 1 drop in to both eyes once or twice daily as needed for dry eyes - in between restasis doses  . PREBIOTIC PRODUCT PO Take 1 scoop by mouth daily before breakfast.   . ranitidine (ZANTAC) 150 MG tablet TAKE 1 TABLET (150 MG TOTAL) BY MOUTH 2 (TWO) TIMES DAILY.  . SYMBICORT 160-4.5 MCG/ACT inhaler INHALE 2 PUFF INTO THE LUNGS TWICE A DAY AS NEEDED   No facility-administered encounter medications on file as of 06/19/2017.     Activities of Daily Living In your present state of health, do you have any difficulty performing the following activities: 04/20/2017 04/20/2017  Hearing? N N  Vision? N N  Difficulty concentrating or making decisions? N N  Walking or climbing stairs? N N  Dressing or bathing? N N  Doing errands, shopping? - N    Some recent data might be hidden    Patient Care Team: Copland, Gwenlyn Found, MD as PCP - General (Family Medicine) Nelson Chimes, MD as Consulting Physician (Ophthalmology) Nyoka Cowden, MD as Consulting Physician (Pulmonary Disease) Armbruster, Willaim Rayas, MD as Consulting Physician (Gastroenterology) Newman Pies, MD as Consulting Physician (Otolaryngology)    Assessment:    No ROS.  Medicare Wellness Visit. Additional risk factors are reflected in the social history.  Exercise Activities and Dietary recommendations   Diet (meal preparation, eat out, water intake, caffeinated beverages, dairy products, fruits and vegetables): {Desc; diets:16563} Breakfast: Lunch:  Dinner:      Goals    . Increase physical activity  As tolerated. Begin chair exercises as tolerated.    . Patient Stated          Get back to normal weight of 145-150 lbs.      Fall Risk Fall Risk  09/03/2016 03/04/2016 02/07/2016 06/19/2014  Falls in the past year? No No No No  Comment - Pt does report she sometimes gets tangled in O2 tubing, but has not fallen. - -  Risk for fall due to : - Other (Comment) - -  Risk for fall due to: Comment - Pt on continuous O2 at home and has long O2 extension tubing. - -   Depression Screen PHQ 2/9 Scores 09/03/2016 03/04/2016 02/07/2016 12/12/2014  PHQ - 2 Score 0 0 0 1     Cognitive Function MMSE - Mini Mental State Exam 03/04/2016  Orientation to time 5  Orientation to Place 5  Registration 3  Attention/ Calculation 5  Recall 3  Language- name 2 objects 2  Language- repeat 1  Language- follow 3 step command 3  Language- read & follow direction 1  Write a sentence 1  Copy design 1  Total score 30        Immunization History  Administered Date(s) Administered  . Influenza Split 07/29/2012  . Influenza Whole 07/09/2002, 07/09/2009, 06/20/2010  . Influenza, High Dose Seasonal PF 06/03/2016  . Influenza,inj,Quad PF,6+ Mos 07/11/2013, 06/29/2014  .  Pneumococcal Conjugate-13 06/29/2014  . Pneumococcal Polysaccharide-23 07/09/2002, 12/14/2009, 03/04/2016  . Td 09/05/2010   Screening Tests Health Maintenance  Topic Date Due  . INFLUENZA VACCINE  04/08/2017  . MAMMOGRAM  02/18/2018  . Fecal DNA (Cologuard)  12/17/2018  . TETANUS/TDAP  09/05/2020  . DEXA SCAN  Completed  . Hepatitis C Screening  Completed  . PNA vac Low Risk Adult  Completed      Plan:   ***   I have personally reviewed and noted the following in the patient's chart:   . Medical and social history . Use of alcohol, tobacco or illicit drugs  . Current medications and supplements . Functional ability and status . Nutritional status . Physical activity . Advanced directives . List of other physicians . Hospitalizations, surgeries, and ER visits in previous 12 months . Vitals . Screenings to include cognitive, depression, and falls . Referrals and appointments  In addition, I have reviewed and discussed with patient certain preventive protocols, quality metrics, and best practice recommendations. A written personalized care plan for preventive services as well as general preventive health recommendations were provided to patient.     [place], [place]  06/15/2017

## 2017-06-17 ENCOUNTER — Encounter: Payer: Self-pay | Admitting: Family Medicine

## 2017-06-17 ENCOUNTER — Telehealth: Payer: Self-pay | Admitting: Family Medicine

## 2017-06-17 NOTE — Telephone Encounter (Signed)
[name] Self [phone] or MyChart   Nyriah called to say she would like for you to remove that she has a heart problem from her chart. Please call or email her about this.

## 2017-06-17 NOTE — Telephone Encounter (Signed)
Called Dynasti back-  I do not see any mention of heart disease on her problem list or history. However apparently her insurance company called her and wanted to discuss her "heart problem" with her.  She is upset about this.    She was in the ER a couple of times recently with apparent complications surrounding her sarcoidosis, but no evidence of heart disease that I can see.  They do mention possibly doing a holter monitor for tachycardia, but this is not a dx of disease  Pulse Readings from Last 3 Encounters:  06/11/17 (!) 111  06/05/17 (!) 116  05/27/17 (!) 114   I will write said letter for pt and will offer her a holter monitor to eval tachycardia

## 2017-06-19 ENCOUNTER — Ambulatory Visit: Payer: Self-pay | Admitting: *Deleted

## 2017-06-24 DIAGNOSIS — J45909 Unspecified asthma, uncomplicated: Secondary | ICD-10-CM | POA: Diagnosis not present

## 2017-06-24 DIAGNOSIS — D869 Sarcoidosis, unspecified: Secondary | ICD-10-CM | POA: Diagnosis not present

## 2017-06-30 ENCOUNTER — Other Ambulatory Visit: Payer: Self-pay | Admitting: Cardiothoracic Surgery

## 2017-06-30 DIAGNOSIS — J9383 Other pneumothorax: Secondary | ICD-10-CM

## 2017-07-01 ENCOUNTER — Encounter: Payer: Self-pay | Admitting: Cardiothoracic Surgery

## 2017-07-01 ENCOUNTER — Ambulatory Visit
Admission: RE | Admit: 2017-07-01 | Discharge: 2017-07-01 | Disposition: A | Discharge status: Home / Self Care | Payer: Medicare Other | Source: Ambulatory Visit | Attending: Cardiothoracic Surgery | Admitting: Cardiothoracic Surgery

## 2017-07-01 ENCOUNTER — Other Ambulatory Visit: Payer: Self-pay

## 2017-07-01 ENCOUNTER — Ambulatory Visit (INDEPENDENT_AMBULATORY_CARE_PROVIDER_SITE_OTHER): Payer: Medicare Other | Admitting: Cardiothoracic Surgery

## 2017-07-01 VITALS — BP 140/93 | HR 115 | Resp 20 | Ht 65.0 in | Wt 203.2 lb

## 2017-07-01 DIAGNOSIS — J9383 Other pneumothorax: Secondary | ICD-10-CM | POA: Diagnosis not present

## 2017-07-01 DIAGNOSIS — Z9889 Other specified postprocedural states: Secondary | ICD-10-CM | POA: Diagnosis not present

## 2017-07-01 DIAGNOSIS — J939 Pneumothorax, unspecified: Secondary | ICD-10-CM | POA: Diagnosis not present

## 2017-07-01 DIAGNOSIS — D869 Sarcoidosis, unspecified: Secondary | ICD-10-CM

## 2017-07-01 DIAGNOSIS — R918 Other nonspecific abnormal finding of lung field: Secondary | ICD-10-CM | POA: Diagnosis not present

## 2017-07-01 NOTE — Progress Notes (Signed)
PCP is Copland, Gwenlyn FoundJessica C, MD Referring Provider is Oretha MilchAlva, Rakesh V, MD  Chief Complaint  Patient presents with  . Follow-up    1 month right pneumothorax with a CXR    HPI: Patient returns with chest x-ray to discuss removal of endobronchial valves placed September 1 in the right upper lobe for treatment of spontaneous pneumothorax in the setting of end-stage sarcoidosis.  Shortly after the valve was replaced the patient's air leak resolved and the chest tube was removed.  Patient is continued to do well at home.  No recent flareups of pneumonia or bronchitis.  She is on home oxygen 3 L/min with activity.  She is on no chronic anticoagulation.  We have tentatively scheduled her elective endobronchial valve removal to occur on November 2 at [facility] with general anesthesia.  This will be set up as outpatient but the patient understands that she could need overnight observation.  Past Medical History:  Diagnosis Date  . Anxiety   . Asthma   . Barrett esophagus   . GERD (gastroesophageal reflux disease)   . Hiatal hernia   . IBS (irritable bowel syndrome)   . Migraines   . Pulmonary sarcoidosis [facility](HCC)     Past Surgical History:  Procedure Laterality Date  . BRAVO PH STUDY N/A 10/10/2013   Procedure: BRAVO PH STUDY;  Surgeon: Hart Carwinora M Brodie, MD;  Location: [facility];  Service: Endoscopy;  Laterality: N/A;  placed 27  . BRONCHIAL BIOPSY  1996  . CATARACT EXTRACTION, BILATERAL    . CHEST TUBE INSERTION Right 04/23/2017   Procedure: CHEST TUBE INSERTION;  Surgeon: Kerin PernaVan Trigt, Peter, MD;  [facility];  Service: Thoracic;  Laterality: Right;  . CHOLECYSTECTOMY    . ESOPHAGOGASTRODUODENOSCOPY N/A 10/10/2013   Procedure: ESOPHAGOGASTRODUODENOSCOPY (EGD);  Surgeon: Hart Carwinora M Brodie, MD;  [facility];  Service: Endoscopy;  Laterality: N/A;  pt asthmatic, RA 92% at rest.  Coughing frequently. Wheezes noted in upper lung fields at auscultation. Pt instructed to use home inhaler prior  to procedure and placed on supportive O2 @ 2 lpm in pre-procedural. Teaching done ie. use of inhaler. Pt states she ju  . NISSEN FUNDOPLICATION    . ROTATOR CUFF REPAIR     x3(2 on L, 1 on R)  . VIDEO BRONCHOSCOPY WITH INSERTION OF INTERBRONCHIAL VALVE (IBV) N/A 05/08/2017   Procedure: VIDEO BRONCHOSCOPY WITH INSERTION OF INTERBRONCHIAL VALVE (IBV);  Surgeon: Loreli SlotHendrickson, Steven C, MD;  [facility];  Service: Thoracic;  Laterality: N/A;    Family History  Problem Relation Age of Onset  . Adopted: Yes    Social History Social History  Substance Use Topics  . Smoking status: Former Smoker    Packs/day: 1.00    Years: 20.00    Types: Cigarettes    Quit date: 09/08/1978  . Smokeless tobacco: Never Used  . Alcohol use No    Current Outpatient Prescriptions  Medication Sig Dispense Refill  . albuterol (VENTOLIN HFA) 108 (90 Base) MCG/ACT inhaler Inhale 2 puffs into the lungs as needed. (Patient taking differently: Inhale 2 puffs into the lungs every 4 (four) hours as needed for wheezing or shortness of breath. ) 1 Inhaler 2  . ALOE VERA JUICE PO Take 120 mLs by mouth daily.     . Ascorbic Acid (VITAMIN C PO) Take 1 tablet by mouth daily.    . ASTRAGALUS PO Take 1 tablet by mouth daily.     . budesonide-formoterol (SYMBICORT) 160-4.5 MCG/ACT inhaler Inhale 2  puffs into the lungs 2 (two) times daily. 1 Inhaler 11  . buPROPion (WELLBUTRIN XL) 300 MG 24 hr tablet Take 1 tablet (300 mg total) by mouth daily. (Patient taking differently: Take 300 mg by mouth daily before breakfast. ) 30 tablet 5  . Calcium Carbonate-Vitamin D (CALCIUM-D PO) Take 1 tablet by mouth daily.    . Collagen Hydrolysate POWD Take 1 scoop by mouth daily with supper.     . Cyanocobalamin (VITAMIN B-12) 5000 MCG TBDP Take 5,000 mcg by mouth daily.    . cycloSPORINE (RESTASIS) 0.05 % ophthalmic emulsion Place 1 drop into both eyes 2 (two) times daily.     . fluticasone (FLONASE) 50 MCG/ACT nasal spray Place 1 spray  into both nostrils 2 (two) times daily.     . furosemide (LASIX) 20 MG tablet TAKE 1 TABLET (20 MG TOTAL) BY MOUTH DAILY. USE AS NEEDED FOR SWELLING (Patient taking differently: Take 20 mg by mouth daily as needed (swelling). ) 90 tablet 1  . GuaiFENesin (MUCINEX PO) Take 1 tablet by mouth every 12 (twelve) hours as needed (cough).    Marland Kitchen ipratropium (ATROVENT HFA) 17 MCG/ACT inhaler Inhale 2 puffs into the lungs every 6 (six) hours as needed for wheezing. 1 Inhaler 1  . loratadine (CLARITIN) 10 MG tablet Take 10 mg by mouth daily.    Marland Kitchen LORazepam (ATIVAN) 0.5 MG tablet TAKE 1/2 TO 1 TABLET BY MOUTH EVERY 8 TO 12 HRS ONLY AS NEEDED (Patient taking differently: TAKE 1/2 TO 1 TABLET (0.25 - 0.5 MG) BY MOUTH EVERY 8 TO 12 HRS ONLY AS NEEDED FOR PANIC ATTACKS) 30 tablet 0  . Multiple Vitamin (MULTIVITAMIN WITH MINERALS) TABS tablet Take 1 tablet by mouth daily.    . Nasal Moisturizer Combination (RHINASE) SOLN Place 2 sprays into the nose 2 (two) times daily as needed (dry, bloody nostrils).     . Omega 3-6-9 Fatty Acids (OMEGA 3-6-9 COMPLEX PO) Take 1 capsule by mouth daily.    . OXYGEN Inhale 2 L into the lungs continuous.    . pantoprazole (PROTONIX) 20 MG tablet TAKE 1 TABLET (20 MG TOTAL) BY MOUTH DAILY. (Patient taking differently: TAKE 1 TABLET (20 MG TOTAL) BY MOUTH DAILY BEFORE BREAKFAST) 90 tablet 3  . polyvinyl alcohol (ARTIFICIAL TEARS) 1.4 % ophthalmic solution Place 1 drop into both eyes See admin instructions. Instill 1 drop in to both eyes once or twice daily as needed for dry eyes - in between restasis doses    . PREBIOTIC PRODUCT PO Take 1 scoop by mouth daily before breakfast.     . ranitidine (ZANTAC) 150 MG tablet TAKE 1 TABLET (150 MG TOTAL) BY MOUTH 2 (TWO) TIMES DAILY. 120 tablet 2   No current facility-administered medications for this visit.     Allergies  Allergen Reactions  . Gabapentin Other (See Comments)    Edema on 2 separate trials  . Lactose Intolerance (Gi) Diarrhea  and Other (See Comments)    Stomach cramps  . Penicillins Other (See Comments)    Unknown - childhood allergy Has patient had a PCN reaction causing immediate rash, facial/tongue/throat swelling, SOB or lightheadedness with hypotension: Unknown Has patient had a PCN reaction causing severe rash involving mucus membranes or skin necrosis: Unknown Has patient had a PCN reaction that required hospitalization: Unknown Has patient had a PCN reaction occurring within the last 10 years: No If all of the above answers are "NO", then may proceed with Cephalosporin use.  Marland Kitchen Pineapple Hives  and Itching  . Aspirin Other (See Comments)    gastritis    Review of Systems        Review of Systems :  [ y ] = yes, [  ] = no        General :  Weight gain [   ]    Weight loss  [   ]  Fatigue [x  ]  Fever [  ]  Chills  [  ]                                Weakness  [  ]           HEENT    Headache [x  ]  Dizziness [  ]  Blurred vision [  ] Glaucoma  [  ]                          Nosebleeds [  ] Painful or loose teeth [  ]        Cardiac :  Chest pain/ pressure [  ]  Resting SOB [  ] exertional SOB [ x ]                        Orthopnea [  ]  Pedal edema  [  ]  Palpitations [  ] Syncope/presyncope [ ]                         Paroxysmal nocturnal dyspnea [  ]         Pulmonary : cough [x  ]  wheezing [x  ]  Hemoptysis [  ] Sputum [  ] Snoring [  ]                              Pneumothorax [  ]  Sleep apnea [  ]        GI : Vomiting [  ]  Dysphagia [  ]  Melena  [  ]  Abdominal pain [ x ] BRBPR [  ]              Heart burn [ x ]  Constipation [  ] Diarrhea  [  ] Colonoscopy [   ]        GU : Hematuria [  ]  Dysuria [  ]  Nocturia [  ] UTI's [  ]        Vascular : Claudication [  ]  Rest pain [  ]  DVT [  ] Vein stripping [  ] leg ulcers [  ]                          TIA [  ] Stroke [  ]  Varicose veins [  ]        NEURO :  Headaches  [x]  Seizures [  ] Vision changes [  ] Paresthesias [  ]                                        Seizures [  ]  Musculoskeletal :  Arthritis [x  ] Gout  [  ]  Back pain [  ]  Joint pain [  ]        Skin :  Rash [  ]  Melanoma [  ] Sores [  ]        Heme : Bleeding problems [  ]Clotting Disorders [  ] Anemia [  ]Blood Transfusion [ ]         Endocrine : Diabetes [  ] Heat or Cold intolerance [  ] Polyuria [  ]excessive thirst [ ]         Psych : Depression [  ]  Anxiety [ x ]  Psych hospitalizations [  ] Memory change [  ]                                               BP (!) 140/93 (BP Location: Right Arm, Patient Position: Sitting, Cuff Size: Large)   Pulse (!) 115   Resp 20   Ht 5\' 5"  (1.651 m)   Wt 203 lb 3.2 oz (92.2 kg)   SpO2 96% Comment: ON 2L O2  BMI 33.81 kg/m  Physical Exam      Physical Exam  General: Pleasant obese female on oxygen accompanied by her son in no acute distress HEENT: Normocephalic pupils equal , dentition adequate Neck: Supple without JVD, adenopathy, or bruit Chest: Clear to auscultation, symmetrical breath sounds, slightly coarse, no rhonchi, no tenderness             or deformity.  Old right chest tube site healed. Cardiovascular: Regular rate and rhythm, no murmur, no gallop, peripheral pulses             palpable in all extremities Abdomen:  Soft, nontender, no palpable mass or organomegaly Extremities: Warm, well-perfused, no clubbing cyanosis edema or tenderness,              no venous stasis changes of the legs Rectal/GU: Deferred Neuro: Grossly non--focal and symmetrical throughout Skin: Clean and dry without rash or ulceration   Diagnostic Tests: Chest x-ray today shows bilateral pulmonary fibrosis, no pneumothorax or pleural effusion  Impression: Successful resolution of air leak and right spontaneous pneumothorax with placement of right upper lobe endobronchial valves.  These will be have been in place for 8 weeks and will be scheduled for removal as outpatient bronchoscopy under general anesthesia  on November 2 at [facility]  Plan: I discussed the procedure of general anesthesia and bronchoscopy and removal of endobronchial valves with the potential risks of bleeding and recurrent pneumothorax and ventilator dependence.  She understands and agrees to proceed with surgery.   Mikey Bussing, MD [facility] [phone]

## 2017-07-03 ENCOUNTER — Telehealth: Payer: Self-pay | Admitting: Pulmonary Disease

## 2017-07-03 NOTE — Telephone Encounter (Signed)
Noted. It was my pleasure.

## 2017-07-03 NOTE — Telephone Encounter (Signed)
Spoke with patient. She stated that she was told by [employer] that the POC she wants is on back order. She has been waiting since June. The POC she has now is too heavy. Advised patient that I would call [employer] to check on the status of her order.   Called [facility] and spoke with [place]. She stated that she does see where they had to order a smaller POC for her and it was on back order. However when she looked at the patient's file, she found a shipping number. Minna AntisLorna stated that she would then contact the patient to advise her of the update. Nothing further needed from our office.   Will close this encounter.

## 2017-07-05 ENCOUNTER — Encounter: Payer: Self-pay | Admitting: Family Medicine

## 2017-07-05 ENCOUNTER — Telehealth: Payer: Self-pay | Admitting: Family Medicine

## 2017-07-05 DIAGNOSIS — I471 Supraventricular tachycardia: Secondary | ICD-10-CM

## 2017-07-05 NOTE — Telephone Encounter (Signed)
Called pt and LMOM- I sent her a message on mychart about holter monitor.  Please review and let me know what she thinks

## 2017-07-07 NOTE — Pre-Procedure Instructions (Signed)
[name]  07/07/2017      [id number] - [place], [place] - [address] [place] [address] Phone: [phone] Fax: [phone]  [facility] - [place], [place] - [address] [facility] [address] [place] [address] [place] [address] Phone: [phone] Fax: [phone]    Your procedure is scheduled on November 2  Report to [facility] [employer] A.M.  Call this number if you have problems the morning of surgery:  [phone]   Remember:  Do not eat food or drink liquids after midnight.  Continue all other medications as directed by your physician except follow these medication instructions before surgery   Take these medicines the morning of surgery with A SIP OF WATER  albuterol (VENTOLIN HFA)  budesonide-formoterol (SYMBICORT)  buPROPion (WELLBUTRIN XL) fluticasone (FLONASE) 50 MCG/ACT ipratropium (ATROVENT HFA) loratadine (CLARITIN) LORazepam (ATIVAN)  pantoprazole (PROTONIX) ranitidine (ZANTAC)  Eye drops if needed  7 days prior to surgery STOP taking any Aspirin (unless otherwise instructed by your surgeon), Aleve, Naproxen, Ibuprofen, Motrin, Advil, Goody's, BC's, all herbal medications, fish oil, and all vitamins    Do not wear jewelry, make-up or nail polish.  Do not wear lotions, powders, or perfumes, or deoderant.  Do not shave 48 hours prior to surgery.  Men may shave face and neck.  Do not bring valuables to the hospital.  [facility] is not responsible for any belongings or valuables.  Contacts, dentures or bridgework may not be worn into surgery.  Leave your suitcase in the car.  After surgery it may be brought to your room.  For patients admitted to the hospital, discharge time will be determined by your treatment team.  Patients discharged the day of surgery will not be allowed to drive home.    Special instructions:   [place]- Preparing For Surgery  Before  surgery, you can play an important role. Because skin is not sterile, your skin needs to be as free of germs as possible. You can reduce the number of germs on your skin by washing with CHG (chlorahexidine gluconate) Soap before surgery.  CHG is an antiseptic cleaner which kills germs and bonds with the skin to continue killing germs even after washing.  Please do not use if you have an allergy to CHG or antibacterial soaps. If your skin becomes reddened/irritated stop using the CHG.  Do not shave (including legs and underarms) for at least 48 hours prior to first CHG shower. It is OK to shave your face.  Please follow these instructions carefully.   1. Shower the NIGHT BEFORE SURGERY and the MORNING OF SURGERY with CHG.   2. If you chose to wash your hair, wash your hair first as usual with your normal shampoo.  3. After you shampoo, rinse your hair and body thoroughly to remove the shampoo.  4. Use CHG as you would any other liquid soap. You can apply CHG directly to the skin and wash gently with a scrungie or a clean washcloth.   5. Apply the CHG Soap to your body ONLY FROM THE NECK DOWN.  Do not use on open wounds or open sores. Avoid contact with your eyes, ears, mouth and genitals (private parts). Wash Face and genitals (private parts)  with your normal soap.  6. Wash thoroughly, paying special attention to the area where your surgery will be performed.  7. Thoroughly rinse your body with warm water from the neck down.  8. DO NOT shower/wash with your normal soap after using and rinsing off the CHG Soap.  9. Pat yourself dry with a CLEAN TOWEL.  10. Wear CLEAN PAJAMAS to bed the night before surgery, wear comfortable clothes the morning of surgery  11. Place CLEAN SHEETS on your bed the night of your first shower and DO NOT SLEEP WITH PETS.    Day of Surgery: Do not apply any deodorants/lotions. Please wear clean clothes to the hospital/surgery center.      Please read over  the following fact sheets that you were given.

## 2017-07-07 NOTE — Progress Notes (Signed)
Anesthesia Chart Review: Patient is a 66 year old female scheduled for video bronchoscopy for removal of endoscopic bronchial valve on 07/10/17 by Dr. Kathlee NationsPeter Van Trigt. She underwent right chest tube insertion on 04/23/17 (for spontaneous pneumothorax in the setting of end-stage sarcoidosis) and video bronchoscopy with insertion of interbronchial valve (for persistent air leak) on 05/08/17. Her air leak resolved, so recommended endobronchial valve after 8 weeks.  History includes former smoker (quit '80), pulmonary sarcoidosis (diagnosed '96), home O2 (presribed 2015; 2L at rest, 3-4L/Prosser with activity), asthma, anxiety, Barretts esophagus, GERD, hiatal hernia, IBS, migraines, cholecystectomy, Nissen fundoplication, bilateral rotator cuff repairs.  - ED visit 06/05/17 for SOB with palpitations and dizziness while walking. Notes indicate she had been walking around without her oxygen. She was tachycardic (SVT versus SR). CTA was negative for PE and recurrent PTX. Troponin was negative. She was discharged with instructions to follow-up up pulmonology. (Patient called Dr. Patsy Lageropland who noted patient was tachycardic during her ED visit and offered patient a Holter monitor test, but appears to be letting patient decide if she would like to pursue.)     Meds include albuterol, Symbicort, Wellbutrin XL, Flonase, Lasix, Mucinex, Atrovent, Claritin, Ativan, omega fatty acids, oxygen, Protonix, Zantac.  - PCP is Dr. Shanda BumpsJessica Copland.  - Pulmonologist is Dr. Cyril Mourningakesh Alva. Last visit 06/11/17. He feels she would benefit from pulmonary rehab since her deconditioning has increased. He is aware of surgery plans, but felt there was some risk for recurrent pneumothorax in the future.  EKG: SVT at 137 bpm, non-specific ST/T wave abnormality. QT 372, QTc 561 ms. Interpreting physician felt p wave may have been measured as a T wave.   Echo 08/14/16: Study Conclusions - Left ventricle: The cavity size was normal. Wall thickness was  increased in a pattern of mild LVH. Systolic function was normal.   The estimated ejection fraction was in the range of 60% to 65%.   Wall motion was normal; there were no regional wall motion   abnormalities. Doppler parameters are consistent with abnormal   left ventricular relaxation (grade 1 diastolic dysfunction). - Aortic valve: There was no stenosis. - Aorta: Mildly dilated ascending aorta. Aortic root dimension: 36   mm (ED). Ascending aortic diameter: 38 mm (S). - Mitral valve: There was no significant regurgitation. - Right ventricle: Poorly visualized. - Tricuspid valve: Peak RV-RA gradient (S): 27 mm Hg. - Pulmonary arteries: PA peak pressure: 30 mm Hg (S). - Inferior vena cava: The vessel was normal in size. The   respirophasic diameter changes were in the normal range (>= 50%),   consistent with normal central venous pressure. Impressions: - Normal LV size with mild LV hypertrophy. EF 60-65%. The RV was   never well-visualized. No significant valvular abnormalities.  CTA 06/05/17: IMPRESSION: 1. No evidence of acute pulmonary embolism. 2. No evidence of recurrent pneumothorax following right upper lobe intrabronchial valve placement. 3. Stable chronic lung disease attributed to chronic sarcoidosis. Associated mediastinal and hilar adenopathy. 4. Central enlargement of the pulmonary arteries consistent with pulmonary arterial hypertension.  CXR 07/01/17: IMPRESSION: 1. No evidence of acute cardiopulmonary disease. No evidence of pneumothorax. 2. Chronic interstitial opacities compatible with known sarcoidosis.  PFT 08/14/16: FEV1 post 1.60 (88%), post ratio 87, FVC post 1.83 (78%), ++BD repsonse , DLCO 35% (down from 42%).  Preoperative labs are pending PAT. I would also like to repeat her EKG at PAT since she was tachycardiac with question of critical [facility]. Additional input  pending PAT appointment.  Velna Ochs [facility]/Anesthesiology Phone [phone] 07/07/2017 5:16 PM

## 2017-07-08 ENCOUNTER — Inpatient Hospital Stay (HOSPITAL_COMMUNITY)
Admission: RE | Admit: 2017-07-08 | Discharge: 2017-07-08 | Disposition: A | Discharge status: Home / Self Care | Payer: Self-pay | Source: Ambulatory Visit

## 2017-07-08 NOTE — Progress Notes (Addendum)
Subjective:   [name] is a 66 y.o. female who presents for Medicare Annual (Subsequent) preventive examination.  Pt describes her health as fair to good.   Review of Systems:  No ROS.  Medicare Wellness Visit. Additional risk factors are reflected in the social history.    Sleep patterns: Pt states sleep is very sporadic.Naps during the day as needed.   Female:   Pap-  Last 08/24/16 -normal Mammo-  Last 02/09/16 -pt states she will schedule. Dexa scan-    Last 02/12/16: osteopenia    CCS- pt reports she had normal Cologuard in 2016 with Dr.Armbruster  Objective:     Vitals: BP 128/82 (BP Location: Left Wrist, Patient Position: Sitting, Cuff Size: Normal)   Pulse 93   Ht 5\' 5"  (1.651 m)   Wt 201 lb 6.4 oz (91.4 kg)   SpO2 98%   BMI 33.51 kg/m   Body mass index is 33.51 kg/m.   Tobacco Social History   Tobacco Use  Smoking Status Former Smoker  . Packs/day: 1.00  . Years: 20.00  . Pack years: 20.00  . Types: Cigarettes  . Last attempt to quit: 09/08/1978  . Years since quitting: 38.8  Smokeless Tobacco Never Used     Counseling given: Not Answered   Past Medical History:  Diagnosis Date  . Anemia   . Anxiety   . Asthma   . Barrett esophagus   . Depression   . GERD (gastroesophageal reflux disease)   . Hiatal hernia   . IBS (irritable bowel syndrome)   . Migraines   . Pneumothorax   . Pulmonary sarcoidosis (HCC)   . Supplemental oxygen dependent   . Wears dentures    Past Surgical History:  Procedure Laterality Date  . APPENDECTOMY    . BRONCHIAL BIOPSY  1996  . CATARACT EXTRACTION, BILATERAL    . CHOLECYSTECTOMY    . NISSEN FUNDOPLICATION    . ROTATOR CUFF REPAIR     x3(2 on L, 1 on R)   Family History  Adopted: Yes   Social History   Substance and Sexual Activity  Sexual Activity Yes    Outpatient Encounter Medications as of 07/17/2017  Medication Sig  . albuterol (VENTOLIN HFA) 108 (90 Base) MCG/ACT inhaler Inhale 2 puffs into  the lungs as needed. (Patient taking differently: Inhale 2 puffs into the lungs every 4 (four) hours as needed for wheezing or shortness of breath. )  . ALOE VERA JUICE PO Take 120 mLs by mouth daily.   . Ascorbic Acid (VITAMIN C PO) Take 1 tablet by mouth daily. 1000 MG  . ASTRAGALUS PO Take 1 tablet by mouth daily.   . budesonide-formoterol (SYMBICORT) 160-4.5 MCG/ACT inhaler Inhale 2 puffs 2 (two) times daily into the lungs.  Marland Kitchen buPROPion (WELLBUTRIN XL) 300 MG 24 hr tablet Take 1 tablet (300 mg total) by mouth daily. (Patient taking differently: Take 300 mg by mouth daily before breakfast. )  . Calcium Carbonate-Vitamin D (CALCIUM-D PO) Take 1 tablet by mouth daily.  . Collagen Hydrolysate POWD Take 1 scoop by mouth daily with supper.   . Cyanocobalamin (VITAMIN B-12) 5000 MCG TBDP Take 5,000 mcg by mouth daily.  . cycloSPORINE (RESTASIS) 0.05 % ophthalmic emulsion Place 1 drop into both eyes 2 (two) times daily.   . fluticasone (FLONASE) 50 MCG/ACT nasal spray Place 1 spray into both nostrils 2 (two) times daily.   . furosemide (LASIX) 20 MG tablet TAKE 1 TABLET (20 MG TOTAL) BY  MOUTH DAILY. USE AS NEEDED FOR SWELLING (Patient taking differently: Take 20 mg by mouth daily as needed (swelling). )  . guaiFENesin (MUCINEX) 600 MG 12 hr tablet Take 1 tablet by mouth every 12 (twelve) hours as needed (cough).  Marland Kitchen. ipratropium (ATROVENT HFA) 17 MCG/ACT inhaler Inhale 2 puffs into the lungs every 6 (six) hours as needed for wheezing.  Marland Kitchen. loratadine (CLARITIN) 10 MG tablet Take 10 mg by mouth daily.  Marland Kitchen. LORazepam (ATIVAN) 0.5 MG tablet TAKE 1/2 TO 1 TABLET BY MOUTH EVERY 8 TO 12 HRS ONLY AS NEEDED (Patient taking differently: TAKE 1/2 TO 1 TABLET (0.25 - 0.5 MG) BY MOUTH EVERY 8 TO 12 HRS ONLY AS NEEDED FOR PANIC ATTACKS)  . Multiple Vitamin (MULTIVITAMIN WITH MINERALS) TABS tablet Take 1 tablet by mouth daily.  . Nasal Moisturizer Combination (RHINASE) SOLN Place 2 sprays into the nose 2 (two) times daily  as needed (dry, bloody nostrils).   . Omega 3-6-9 Fatty Acids (OMEGA 3-6-9 COMPLEX PO) Take 1 capsule by mouth daily.  . OXYGEN Inhale 2 L into the lungs continuous.  . pantoprazole (PROTONIX) 20 MG tablet TAKE 1 TABLET (20 MG TOTAL) BY MOUTH DAILY. (Patient taking differently: TAKE 1 TABLET (20 MG TOTAL) BY MOUTH DAILY BEFORE BREAKFAST)  . polyvinyl alcohol (ARTIFICIAL TEARS) 1.4 % ophthalmic solution Place 1 drop into both eyes See admin instructions. Instill 1 drop in to both eyes once or twice daily as needed for dry eyes - in between restasis doses  . PREBIOTIC PRODUCT PO Take 1 scoop by mouth daily before breakfast.   . ranitidine (ZANTAC) 150 MG tablet TAKE 1 TABLET (150 MG TOTAL) BY MOUTH 2 (TWO) TIMES DAILY.  . [DISCONTINUED] budesonide-formoterol (SYMBICORT) 160-4.5 MCG/ACT inhaler Inhale 2 puffs into the lungs 2 (two) times daily.   No facility-administered encounter medications on file as of 07/17/2017.     Activities of Daily Living In your present state of health, do you have any difficulty performing the following activities: 07/17/2017 07/09/2017  Hearing? N N  Vision? N N  Comment hx. of cataract sx -  Difficulty concentrating or making decisions? N N  Walking or climbing stairs? Y Y  Comment SOB w/ exertion -  Dressing or bathing? N N  Doing errands, shopping? N N  Preparing Food and eating ? N -  Using the Toilet? N -  In the past six months, have you accidently leaked urine? N -  Do you have problems with loss of bowel control? N -  Managing your Medications? N -  Managing your Finances? N -  Housekeeping or managing your Housekeeping? N -  Some recent data might be hidden    Patient Care Team: Copland, Gwenlyn FoundJessica C, MD as PCP - General (Family Medicine) Nelson Chimesigby, Donald, MD as Consulting Physician (Ophthalmology) Armbruster, Willaim RayasSteven P, MD as Consulting Physician (Gastroenterology) Newman Pieseoh, Su, MD as Consulting Physician (Otolaryngology) CarefreeAlvarado, Anna GenreAmber R Alva, Rakesh V,  MD as Consulting Physician (Pulmonary Disease)    Assessment:    Physical assessment deferred to PCP.  Exercise Activities and Dietary recommendations Current Exercise Habits: The patient does not participate in regular exercise at present, Exercise limited by: respiratory conditions(s) Diet (meal preparation, eat out, water intake, caffeinated beverages, dairy products, fruits and vegetables): in general, a "healthy" diet      Goals    . Weight (lb) < 170 lb (77.1 kg)     Do chair exercises and eat healthy       Fall Risk  Fall Risk  07/17/2017 09/03/2016 03/04/2016 02/07/2016 06/19/2014  Falls in the past year? No No No No No  Comment - - Pt does report she sometimes gets tangled in O2 tubing, but has not fallen. - -  Risk for fall due to : - - Other (Comment) - -  Risk for fall due to: Comment - - Pt on continuous O2 at home and has long O2 extension tubing. - -   Depression Screen PHQ 2/9 Scores 07/17/2017 09/03/2016 03/04/2016 02/07/2016  PHQ - 2 Score 0 0 0 0     Cognitive Function MMSE - Mini Mental State Exam 07/17/2017 03/04/2016  Orientation to time 5 5  Orientation to Place 5 5  Registration 3 3  Attention/ Calculation 5 5  Recall 1 3  Language- name 2 objects 2 2  Language- repeat 1 1  Language- follow 3 step command 3 3  Language- read & follow direction 1 1  Write a sentence 1 1  Copy design 1 1  Total score 28 30        Immunization History  Administered Date(s) Administered  . Influenza Split 07/29/2012  . Influenza Whole 07/09/2002, 07/09/2009, 06/20/2010  . Influenza, High Dose Seasonal PF 06/03/2016  . Influenza,inj,Quad PF,6+ Mos 07/11/2013, 06/29/2014  . Pneumococcal Conjugate-13 06/29/2014  . Pneumococcal Polysaccharide-23 07/09/2002, 12/14/2009, 03/04/2016  . Td 09/05/2010   Screening Tests Health Maintenance  Topic Date Due  . INFLUENZA VACCINE  04/08/2017  . MAMMOGRAM  02/18/2018  . Fecal DNA (Cologuard)  12/17/2018  . TETANUS/TDAP   09/05/2020  . DEXA SCAN  Completed  . Hepatitis C Screening  Completed  . PNA vac Low Risk Adult  Completed      Plan:   Follow up with PCP as directed  Continue to eat heart healthy diet (full of fruits, vegetables, whole grains, lean protein, water--limit salt, fat, and sugar intake) and increase physical activity as tolerated.  Continue doing brain stimulating activities (puzzles, reading, adult coloring books, staying active) to keep memory sharp.   Bring a copy of your living will and/or healthcare power of attorney to your next office visit.   I have personally reviewed and noted the following in the patient's chart:   . Medical and social history . Use of alcohol, tobacco or illicit drugs  . Current medications and supplements . Functional ability and status . Nutritional status . Physical activity . Advanced directives . List of other physicians . Hospitalizations, surgeries, and ER visits in previous 12 months . Vitals . Screenings to include cognitive, depression, and falls . Referrals and appointments  In addition, I have reviewed and discussed with patient certain preventive protocols, quality metrics, and best practice recommendations. A written personalized care plan for preventive services as well as general preventive health recommendations were provided to patient.     Avon Gully, RN  07/17/2017  I have reviewed the MWE note by Ms. Krysti Hickling above and agree with her documentation Arthor Captain MD

## 2017-07-09 ENCOUNTER — Encounter (HOSPITAL_COMMUNITY): Payer: Self-pay

## 2017-07-09 ENCOUNTER — Encounter (HOSPITAL_COMMUNITY)
Admission: RE | Admit: 2017-07-09 | Discharge: 2017-07-09 | Disposition: A | Discharge status: Home / Self Care | Payer: Medicare Other | Source: Ambulatory Visit | Attending: Cardiothoracic Surgery | Admitting: Cardiothoracic Surgery

## 2017-07-09 ENCOUNTER — Other Ambulatory Visit: Payer: Self-pay

## 2017-07-09 DIAGNOSIS — Z7951 Long term (current) use of inhaled steroids: Secondary | ICD-10-CM | POA: Diagnosis not present

## 2017-07-09 DIAGNOSIS — Z4682 Encounter for fitting and adjustment of non-vascular catheter: Secondary | ICD-10-CM | POA: Diagnosis not present

## 2017-07-09 DIAGNOSIS — R918 Other nonspecific abnormal finding of lung field: Secondary | ICD-10-CM | POA: Diagnosis not present

## 2017-07-09 DIAGNOSIS — Z01810 Encounter for preprocedural cardiovascular examination: Secondary | ICD-10-CM | POA: Diagnosis not present

## 2017-07-09 DIAGNOSIS — D869 Sarcoidosis, unspecified: Secondary | ICD-10-CM

## 2017-07-09 DIAGNOSIS — Z886 Allergy status to analgesic agent status: Secondary | ICD-10-CM | POA: Diagnosis not present

## 2017-07-09 DIAGNOSIS — K219 Gastro-esophageal reflux disease without esophagitis: Secondary | ICD-10-CM | POA: Diagnosis not present

## 2017-07-09 DIAGNOSIS — D860 Sarcoidosis of lung: Secondary | ICD-10-CM | POA: Diagnosis not present

## 2017-07-09 DIAGNOSIS — J45909 Unspecified asthma, uncomplicated: Secondary | ICD-10-CM | POA: Diagnosis not present

## 2017-07-09 DIAGNOSIS — Z87891 Personal history of nicotine dependence: Secondary | ICD-10-CM | POA: Diagnosis not present

## 2017-07-09 DIAGNOSIS — F418 Other specified anxiety disorders: Secondary | ICD-10-CM | POA: Diagnosis not present

## 2017-07-09 DIAGNOSIS — Z79899 Other long term (current) drug therapy: Secondary | ICD-10-CM | POA: Diagnosis not present

## 2017-07-09 DIAGNOSIS — E669 Obesity, unspecified: Secondary | ICD-10-CM | POA: Diagnosis not present

## 2017-07-09 DIAGNOSIS — Z01812 Encounter for preprocedural laboratory examination: Secondary | ICD-10-CM | POA: Diagnosis not present

## 2017-07-09 DIAGNOSIS — J939 Pneumothorax, unspecified: Secondary | ICD-10-CM | POA: Diagnosis not present

## 2017-07-09 DIAGNOSIS — Z6833 Body mass index (BMI) 33.0-33.9, adult: Secondary | ICD-10-CM | POA: Diagnosis not present

## 2017-07-09 HISTORY — DX: Dependence on supplemental oxygen: Z99.81

## 2017-07-09 HISTORY — DX: Anemia, unspecified: D64.9

## 2017-07-09 HISTORY — DX: Depression, unspecified: F32.A

## 2017-07-09 HISTORY — DX: Pneumothorax, unspecified: J93.9

## 2017-07-09 HISTORY — DX: Major depressive disorder, single episode, unspecified: F32.9

## 2017-07-09 HISTORY — DX: Presence of dental prosthetic device (complete) (partial): Z97.2

## 2017-07-09 LAB — COMPREHENSIVE METABOLIC PANEL
ALT: 27 U/L (ref 14–54)
AST: 33 U/L (ref 15–41)
Albumin: 4 g/dL (ref 3.5–5.0)
Alkaline Phosphatase: 82 U/L (ref 38–126)
Anion gap: 10 (ref 5–15)
BUN: 14 mg/dL (ref 6–20)
CO2: 25 mmol/L (ref 22–32)
Calcium: 9.8 mg/dL (ref 8.9–10.3)
Chloride: 104 mmol/L (ref 101–111)
Creatinine, Ser: 1.15 mg/dL — ABNORMAL HIGH (ref 0.44–1.00)
GFR calc Af Amer: 55 mL/min — ABNORMAL LOW (ref >60)
GFR calc non Af Amer: 48 mL/min — ABNORMAL LOW (ref >60)
Glucose, Bld: 101 mg/dL — ABNORMAL HIGH (ref 65–99)
Potassium: 3.9 mmol/L (ref 3.5–5.1)
Sodium: 139 mmol/L (ref 135–145)
Total Bilirubin: 0.4 mg/dL (ref 0.3–1.2)
Total Protein: 8.1 g/dL (ref 6.5–8.1)

## 2017-07-09 LAB — CBC
HCT: 37.8 % (ref 36.0–46.0)
Hemoglobin: 12.2 g/dL (ref 12.0–15.0)
MCH: 30.1 pg (ref 26.0–34.0)
MCHC: 32.3 g/dL (ref 30.0–36.0)
MCV: 93.3 fL (ref 78.0–100.0)
Platelets: 316 10*3/uL (ref 150–400)
RBC: 4.05 MIL/uL (ref 3.87–5.11)
RDW: 13.6 % (ref 11.5–15.5)
WBC: 7.7 10*3/uL (ref 4.0–10.5)

## 2017-07-09 LAB — PROTIME-INR
INR: 1.01
Prothrombin Time: 13.2 seconds (ref 11.4–15.2)

## 2017-07-09 LAB — APTT: aPTT: 31 seconds (ref 24–36)

## 2017-07-09 NOTE — Progress Notes (Signed)
Anesthesia PAT Evaluation: Patient is a 66 year old female scheduled for video bronchoscopy for removal of endoscopic bronchial valve on 07/10/17 by Dr. Kathlee NationsPeter Van Trigt. She underwent right chest tube insertion on 04/23/17 (for spontaneous pneumothorax in the setting of end-stage sarcoidosis) and video bronchoscopy with insertion of interbronchial valve (for persistent air leak) on 05/08/17. Her air leak resolved, so recommended endobronchial valve after 8 weeks. PAT was initially scheduled for 06/10/17, but she rescheduled due to sinus headache which is now improved. She took an Pharmacist, community[employer]ber to (and from) PAT today, but her husband will be bringing her to the hospital tomorrow.  History includes former smoker (quit '80), pulmonary sarcoidosis (diagnosed '96), home O2 (presribed 2015; 2L at rest, 3-4L/[place] with activity), asthma, anxiety, Barretts esophagus, GERD, hiatal hernia, IBS, migraines, cholecystectomy, Nissen fundoplication, bilateral rotator cuff repairs. BMI is consistent with obesity. - ED visit 06/05/17 for SOB with palpitations and dizziness while walking. Notes indicate she had been walking around without her oxygen. She was tachycardic (SVT versus SR on EKG). CTA was negative for PE and recurrent PTX. Troponin was negative. She was discharged with instructions to follow-up up pulmonology. (Patient called Dr. Patsy Lageropland who noted patient was tachycardic during her ED visit and offered patient a Holter monitor test [see 07/05/17 patient email under Encounter tab). Patient reports she plans to have a Holter monitor following recovery for surgery.     Meds include albuterol, Symbicort, Wellbutrin XL, Flonase, Lasix, Mucinex, Atrovent, Claritin, Ativan, omega fatty acids, oxygen, Protonix, Zantac.  - PCP is Dr. Shanda BumpsJessica Copland.  - Pulmonologist is Dr. Cyril Mourningakesh Alva. Last visit 06/11/17. He feels she would benefit from pulmonary rehab since her deconditioning has increased. He is aware of surgery plans, but felt  there was some risk for recurrent pneumothorax in the future.  BP 110/75   Pulse (!) 125   Temp 37.1 C (Oral)   Resp 20   Ht 5\' 5"  (1.651 m)   Wt 200 lb 3 oz (90.8 kg)   SpO2 97%   BMI 33.31 kg/m  She is currently on 2L/[place]. HR on EKG was 118.  (In review of multiple office visits from 09/03/16-07/01/17, HR has consistently been tachycardic 122 (09/03/16)-126-116-133-111-114-111-115 (07/01/17). Patient feels "fine". Denied chest pain, palpitations, syncope, recent wheezing. Her breathing feels stable. She is on 2L at rest and at night, but has to increase to 3-4L anytime she is out of the bed or chair--if she doesn't she will have increased SOB. She is able to do spot pulse ox/HR checks and note there are times when she is tachycardic but also times when her HR can be ~ 80-90 bpm (ie, first thing in the morning or when she has been resting at home for several hours). She has had a normal TSH within the past year. She only uses albuterol PRN and only used it once today (denied frequent use). She sleeps in her bed on 3-4 pillows. She was able to run errands prior to her pneumothorax, but has not attempted since then. She denied any known CAD/MI or diabetes history. On exam, heart regular, but tachycardiac at 118 bpm. No murmur noted. Lungs clear. She is pleasant and very talkative and without any significant conversational dyspnea.   EKG 07/09/17: ST at 118, possible LAE. Tracing on 06/05/17 showed SVT at 137 bpm, non-specific ST/T wave abnormality. QT 372, QTc 561 ms. Interpreting physician felt p wave may have been measured as a T wave. Tracing on 05/21/17 showed ST at 124 bpm.  Echo 08/14/16: Study Conclusions - Left ventricle: The cavity size was normal. Wall thickness was increased in a pattern of mild LVH. Systolic function was normal. The estimated ejection fraction was in the range of 60% to 65%. Wall motion was normal; there were no regional wall motion abnormalities. Doppler  parameters are consistent with abnormal left ventricular relaxation (grade 1 diastolic dysfunction). - Aortic valve: There was no stenosis. - Aorta: Mildly dilated ascending aorta. Aortic root dimension: 36 mm (ED). Ascending aortic diameter: 38 mm (S). - Mitral valve: There was no significant regurgitation. - Right ventricle: Poorly visualized. - Tricuspid valve: Peak RV-RA gradient (S): 27 mm Hg. - Pulmonary arteries: PA peak pressure: 30 mm Hg (S). - Inferior vena cava: The vessel was normal in size. The respirophasic diameter changes were in the normal range (>= 50%), consistent with normal central venous pressure. Impressions: - Normal LV size with mild LV hypertrophy. EF 60-65%. The RV was never well-visualized. No significant valvular abnormalities.  CTA 06/05/17: IMPRESSION: 1. No evidence of acute pulmonary embolism. 2. No evidence of recurrent pneumothorax following right upper lobe intrabronchial valve placement. 3. Stable chronic lung disease attributed to chronic sarcoidosis. Associated mediastinal and hilar adenopathy. 4. Central enlargement of the pulmonary arteries consistent with pulmonary arterial hypertension.  CXR 07/09/17: FINDINGS: Heart size is normal. There is fullness of the hilar regions, stable in appearance. There are coarse parenchymal markings in the lungs, stable in appearance. No new consolidations or pleural effusions are identified. No pleural effusions. Endobronchial valve overlies the superior right hilar region, unchanged. IMPRESSION: Stable appearance of the chest.  PFT 08/14/16: FEV1 post 1.60 (88%), post ratio 87, FVC post 1.83 (78%), ++BD repsonse , DLCO 35% (down from 42%).  Preoperative labs noted. Cr 1.15. CBC, PT/PTT WNL.   Patient with persistent tachycardia since at least 08/2016 (HR ~ 110's - 130's). Patient reports she does have intermittent HR ~ 80-90 at home when resting. BP and O2 sat okay at PAT. EKG today confirms  ST. She is asymptomatic of her tachycardia. She had a normal TSH within the past year. Recent CTA negative for PE. No PTX noted on CXR report. She denies frequent use of albuterol. There is no significant anemia and WBC normal. She denied any acute respiratory symptoms. LVEF normal by echo this year. There is discussion with her PCP about future Holter monitor.  Encouraged patient to follow through with Holter monitor in hopes to determine what percentage of the time she is actually tachycardic. In regards to her surgery, if she remains asymptomatic and HR stable (~ 110's) can hopefully proceed as planned, but did discuss that persistently higher heart rates in the perioperative period could result in need for further evaluation/consultation. Discussed with anesthesiologist Dr. Marcene Duos. Patient to be evaluated by her anesthesiologist and surgeon on the day of surgery.   Velna Ochs [facility]/Anesthesiology Phone [phone] 07/09/2017 4:14 PM

## 2017-07-09 NOTE — Progress Notes (Signed)
Pt denies any acute cardiopulmonary issues.  Pt denies having a stress test and cardiac cath.  Pt under the care of Dr.  Cyril Mourningakesh Alva, Pulmonology and Dr. Abbe AmsterdamJessica Copland, PCP. Anesthesia reviewed pt history (see note).

## 2017-07-10 ENCOUNTER — Encounter (HOSPITAL_COMMUNITY): Payer: Self-pay | Admitting: *Deleted

## 2017-07-10 ENCOUNTER — Ambulatory Visit (HOSPITAL_COMMUNITY)
Admission: RE | Admit: 2017-07-10 | Discharge: 2017-07-10 | Disposition: A | Discharge status: Home / Self Care | Payer: Medicare Other | Source: Ambulatory Visit | Attending: Cardiothoracic Surgery | Admitting: Cardiothoracic Surgery

## 2017-07-10 ENCOUNTER — Ambulatory Visit (HOSPITAL_COMMUNITY): Payer: Medicare Other

## 2017-07-10 ENCOUNTER — Ambulatory Visit (HOSPITAL_COMMUNITY): Payer: Medicare Other | Admitting: Vascular Surgery

## 2017-07-10 ENCOUNTER — Encounter (HOSPITAL_COMMUNITY)
Admission: RE | Disposition: A | Discharge status: Home / Self Care | Payer: Self-pay | Source: Ambulatory Visit | Attending: Cardiothoracic Surgery

## 2017-07-10 DIAGNOSIS — R918 Other nonspecific abnormal finding of lung field: Secondary | ICD-10-CM | POA: Diagnosis not present

## 2017-07-10 DIAGNOSIS — Z87891 Personal history of nicotine dependence: Secondary | ICD-10-CM | POA: Insufficient documentation

## 2017-07-10 DIAGNOSIS — D869 Sarcoidosis, unspecified: Secondary | ICD-10-CM

## 2017-07-10 DIAGNOSIS — Z4682 Encounter for fitting and adjustment of non-vascular catheter: Secondary | ICD-10-CM | POA: Insufficient documentation

## 2017-07-10 DIAGNOSIS — J939 Pneumothorax, unspecified: Secondary | ICD-10-CM | POA: Diagnosis not present

## 2017-07-10 DIAGNOSIS — D860 Sarcoidosis of lung: Secondary | ICD-10-CM | POA: Diagnosis not present

## 2017-07-10 DIAGNOSIS — J9383 Other pneumothorax: Secondary | ICD-10-CM | POA: Diagnosis not present

## 2017-07-10 DIAGNOSIS — T8579XA Infection and inflammatory reaction due to other internal prosthetic devices, implants and grafts, initial encounter: Secondary | ICD-10-CM | POA: Diagnosis not present

## 2017-07-10 DIAGNOSIS — Z01810 Encounter for preprocedural cardiovascular examination: Secondary | ICD-10-CM | POA: Diagnosis not present

## 2017-07-10 DIAGNOSIS — Z7951 Long term (current) use of inhaled steroids: Secondary | ICD-10-CM | POA: Diagnosis not present

## 2017-07-10 DIAGNOSIS — K219 Gastro-esophageal reflux disease without esophagitis: Secondary | ICD-10-CM | POA: Insufficient documentation

## 2017-07-10 DIAGNOSIS — F418 Other specified anxiety disorders: Secondary | ICD-10-CM | POA: Insufficient documentation

## 2017-07-10 DIAGNOSIS — Z79899 Other long term (current) drug therapy: Secondary | ICD-10-CM | POA: Diagnosis not present

## 2017-07-10 DIAGNOSIS — Z01812 Encounter for preprocedural laboratory examination: Secondary | ICD-10-CM | POA: Insufficient documentation

## 2017-07-10 DIAGNOSIS — Z886 Allergy status to analgesic agent status: Secondary | ICD-10-CM | POA: Diagnosis not present

## 2017-07-10 DIAGNOSIS — J45909 Unspecified asthma, uncomplicated: Secondary | ICD-10-CM | POA: Insufficient documentation

## 2017-07-10 DIAGNOSIS — K589 Irritable bowel syndrome without diarrhea: Secondary | ICD-10-CM | POA: Diagnosis not present

## 2017-07-10 DIAGNOSIS — J9611 Chronic respiratory failure with hypoxia: Secondary | ICD-10-CM | POA: Diagnosis not present

## 2017-07-10 DIAGNOSIS — Z6833 Body mass index (BMI) 33.0-33.9, adult: Secondary | ICD-10-CM | POA: Insufficient documentation

## 2017-07-10 DIAGNOSIS — Z419 Encounter for procedure for purposes other than remedying health state, unspecified: Secondary | ICD-10-CM

## 2017-07-10 DIAGNOSIS — E669 Obesity, unspecified: Secondary | ICD-10-CM | POA: Insufficient documentation

## 2017-07-10 HISTORY — PX: VIDEO BRONCHOSCOPY WITH INSERTION OF INTERBRONCHIAL VALVE (IBV): SHX6178

## 2017-07-10 SURGERY — BRONCHOSCOPY, FLEXIBLE, WITH INTRABRONCHIAL VALVE INSERTION
Anesthesia: General

## 2017-07-10 MED ORDER — OXYCODONE HCL 5 MG PO TABS: 5.0000 mg | ORAL_TABLET | Freq: Once | ORAL | Status: DC | PRN

## 2017-07-10 MED ORDER — LACTATED RINGERS IV SOLN
INTRAVENOUS | Status: DC
  Administered 2017-07-10 (×2): via INTRAVENOUS

## 2017-07-10 MED ORDER — FENTANYL CITRATE (PF) 100 MCG/2ML IJ SOLN: 25.0000 ug | INTRAMUSCULAR | Status: DC | PRN

## 2017-07-10 MED ORDER — ROCURONIUM BROMIDE 50 MG/5ML IV SOSY
PREFILLED_SYRINGE | INTRAVENOUS | Status: DC | PRN
  Administered 2017-07-10: 40 mg via INTRAVENOUS

## 2017-07-10 MED ORDER — SUGAMMADEX SODIUM 200 MG/2ML IV SOLN
INTRAVENOUS | Status: DC | PRN
  Administered 2017-07-10: 200 mg via INTRAVENOUS

## 2017-07-10 MED ORDER — SODIUM CHLORIDE 0.9% FLUSH: 3.0000 mL | Freq: Two times a day (BID) | INTRAVENOUS | Status: DC

## 2017-07-10 MED ORDER — FENTANYL CITRATE (PF) 100 MCG/2ML IJ SOLN
INTRAMUSCULAR | Status: DC | PRN
  Administered 2017-07-10 (×2): 50 ug via INTRAVENOUS

## 2017-07-10 MED ORDER — ROCURONIUM BROMIDE 10 MG/ML (PF) SYRINGE
PREFILLED_SYRINGE | INTRAVENOUS | Status: AC
  Filled 2017-07-10: qty 5

## 2017-07-10 MED ORDER — LIDOCAINE 2% (20 MG/ML) 5 ML SYRINGE
INTRAMUSCULAR | Status: AC
  Filled 2017-07-10: qty 5

## 2017-07-10 MED ORDER — LIDOCAINE 2% (20 MG/ML) 5 ML SYRINGE
INTRAMUSCULAR | Status: DC | PRN
  Administered 2017-07-10: 100 mg via INTRAVENOUS

## 2017-07-10 MED ORDER — ACETAMINOPHEN 325 MG PO TABS: 650.0000 mg | ORAL_TABLET | ORAL | Status: DC | PRN

## 2017-07-10 MED ORDER — FENTANYL CITRATE (PF) 250 MCG/5ML IJ SOLN
INTRAMUSCULAR | Status: AC
  Filled 2017-07-10: qty 5

## 2017-07-10 MED ORDER — OXYCODONE HCL 5 MG/5ML PO SOLN: 5.0000 mg | Freq: Once | ORAL | Status: DC | PRN

## 2017-07-10 MED ORDER — DEXAMETHASONE SODIUM PHOSPHATE 10 MG/ML IJ SOLN
INTRAMUSCULAR | Status: AC
  Filled 2017-07-10: qty 1

## 2017-07-10 MED ORDER — 0.9 % SODIUM CHLORIDE (POUR BTL) OPTIME
TOPICAL | Status: DC | PRN
  Administered 2017-07-10: 1000 mL

## 2017-07-10 MED ORDER — SUGAMMADEX SODIUM 200 MG/2ML IV SOLN
INTRAVENOUS | Status: AC
  Filled 2017-07-10: qty 2

## 2017-07-10 MED ORDER — PROMETHAZINE HCL 25 MG/ML IJ SOLN: 6.2500 mg | INTRAMUSCULAR | Status: DC | PRN

## 2017-07-10 MED ORDER — ONDANSETRON HCL 4 MG/2ML IJ SOLN
INTRAMUSCULAR | Status: DC | PRN
  Administered 2017-07-10: 4 mg via INTRAVENOUS

## 2017-07-10 MED ORDER — ACETAMINOPHEN 500 MG PO TABS: 1000.0000 mg | ORAL_TABLET | Freq: Four times a day (QID) | ORAL | Status: DC

## 2017-07-10 MED ORDER — ACETAMINOPHEN 650 MG RE SUPP: 650.0000 mg | RECTAL | Status: DC | PRN

## 2017-07-10 MED ORDER — DEXAMETHASONE SODIUM PHOSPHATE 10 MG/ML IJ SOLN
INTRAMUSCULAR | Status: DC | PRN
  Administered 2017-07-10: 10 mg via INTRAVENOUS

## 2017-07-10 MED ORDER — SODIUM CHLORIDE 0.9 % IV SOLN: 250.0000 mL | INTRAVENOUS | Status: DC | PRN

## 2017-07-10 MED ORDER — MEPERIDINE HCL 25 MG/ML IJ SOLN: 6.2500 mg | INTRAMUSCULAR | Status: DC | PRN

## 2017-07-10 MED ORDER — PROPOFOL 10 MG/ML IV BOLUS
INTRAVENOUS | Status: DC | PRN
  Administered 2017-07-10: 200 mg via INTRAVENOUS

## 2017-07-10 MED ORDER — OXYCODONE HCL 5 MG PO TABS: 5.0000 mg | ORAL_TABLET | ORAL | Status: DC | PRN

## 2017-07-10 MED ORDER — ONDANSETRON HCL 4 MG/2ML IJ SOLN
INTRAMUSCULAR | Status: AC
  Filled 2017-07-10: qty 2

## 2017-07-10 MED ORDER — SODIUM CHLORIDE 0.9% FLUSH: 3.0000 mL | INTRAVENOUS | Status: DC | PRN

## 2017-07-10 SURGICAL SUPPLY — 29 items
CANISTER SUCT 3000ML PPV (MISCELLANEOUS) ×3 IMPLANT
CONT SPEC 4OZ CLIKSEAL STRL BL (MISCELLANEOUS) ×1 IMPLANT
COVER BACK TABLE 60X90IN (DRAPES) ×3 IMPLANT
DRSG AQUACEL AG ADV 3.5X14 (GAUZE/BANDAGES/DRESSINGS) ×1 IMPLANT
FILTER STRAW FLUID ASPIR (MISCELLANEOUS) IMPLANT
FORCEPS BIOP RJ4 1.8 (CUTTING FORCEPS) IMPLANT
FORCEPS RADIAL JAW LRG 4 PULM (INSTRUMENTS) IMPLANT
GAUZE SPONGE 4X4 12PLY STRL (GAUZE/BANDAGES/DRESSINGS) ×3 IMPLANT
GLOVE BIO SURGEON STRL SZ7.5 (GLOVE) ×3 IMPLANT
GOWN BRE IMP PREV XXLGXLNG (GOWN DISPOSABLE) ×1 IMPLANT
GOWN STRL REUS W/ TWL LRG LVL3 (GOWN DISPOSABLE) IMPLANT
GOWN STRL REUS W/ TWL XL LVL3 (GOWN DISPOSABLE) IMPLANT
GOWN STRL REUS W/TWL LRG LVL3 (GOWN DISPOSABLE) ×3
GOWN STRL REUS W/TWL XL LVL3 (GOWN DISPOSABLE) ×3
KIT CLEAN ENDO COMPLIANCE (KITS) ×3 IMPLANT
KIT ROOM TURNOVER OR (KITS) ×3 IMPLANT
MARKER SKIN DUAL TIP RULER LAB (MISCELLANEOUS) ×3 IMPLANT
NS IRRIG 1000ML POUR BTL (IV SOLUTION) ×3 IMPLANT
OIL SILICONE PENTAX (PARTS (SERVICE/REPAIRS)) ×2 IMPLANT
PAD ARMBOARD 7.5X6 YLW CONV (MISCELLANEOUS) ×6 IMPLANT
RADIAL JAW LRG 4 PULMONARY (INSTRUMENTS) ×4
SOLUTION ANTI FOG 6CC (MISCELLANEOUS) ×3 IMPLANT
STOPCOCK MORSE 400PSI 3WAY (MISCELLANEOUS) ×3 IMPLANT
SYR 10ML LL (SYRINGE) ×3 IMPLANT
SYR 20ML ECCENTRIC (SYRINGE) ×3 IMPLANT
TOWEL NATURAL 4PK STERILE (DISPOSABLE) ×3 IMPLANT
TRAP SPECIMEN MUCOUS 40CC (MISCELLANEOUS) ×3 IMPLANT
TUBE CONNECTING 20'X1/4 (TUBING) ×1
TUBE CONNECTING 20X1/4 (TUBING) ×2 IMPLANT

## 2017-07-10 NOTE — Brief Op Note (Signed)
07/10/2017  3:25 PM  PATIENT:  [name]  66 y.o. female  PRE-OPERATIVE DIAGNOSIS:  pneumothorax, sarcoidosis  POST-OPERATIVE DIAGNOSIS:  pneumothorax, sarcoidosis  PROCEDURE:  Procedure(s): VIDEO BRONCHOSCOPY FOR REMOVAL OF ENDOSCOPIC BRONCHIAL VALVE (N/A)  SURGEON:  Surgeon(s) and Role:    [name]* Van Trigt, Peter, MD - Primary  PHYSICIAN ASSISTANT:   ASSISTANTS: none   ANESTHESIA:   general  EBL:  2 mL   BLOOD ADMINISTERED:none  DRAINS: none   LOCAL MEDICATIONS USED:  NONE  SPECIMEN:   2 EBVs  DISPOSITION OF SPECIMEN:  PATHOLOGY  COUNTS:  YES  TOURNIQUET:  * No tourniquets in log *  DICTATION: .Dragon Dictation  PLAN OF CARE: Discharge to home after PACU  PATIENT DISPOSITION:  PACU - hemodynamically stable.   Delay start of Pharmacological VTE agent (>24hrs) due to surgical blood loss or risk of bleeding: yes

## 2017-07-10 NOTE — Progress Notes (Signed)
Pre Procedure note for inpatients:   [name] has been scheduled for Procedure(s): VIDEO BRONCHOSCOPY FOR REMOVAL OF ENDOSCOPIC BRONCHIAL VALVE (N/A) today. The various methods of treatment have been discussed with the patient. After consideration of the risks, benefits and treatment options the patient has consented to the planned procedure.   The patient has been seen and labs reviewed. There are no changes in the patient's condition to prevent proceeding with the planned procedure today.  Recent labs:  Lab Results  Component Value Date   WBC 7.7 07/09/2017   HGB 12.2 07/09/2017   HCT 37.8 07/09/2017   PLT 316 07/09/2017   GLUCOSE 101 (H) 07/09/2017   CHOL 187 09/03/2016   TRIG 106.0 09/03/2016   HDL 75.50 09/03/2016   LDLCALC 91 09/03/2016   ALT 27 07/09/2017   AST 33 07/09/2017   NA 139 07/09/2017   K 3.9 07/09/2017   CL 104 07/09/2017   CREATININE 1.15 (H) 07/09/2017   BUN 14 07/09/2017   CO2 25 07/09/2017   TSH 1.38 09/03/2016   INR 1.01 07/09/2017    Kerin PernaPeter Van Trigt III, MD 07/10/2017 2:20 PM

## 2017-07-10 NOTE — Transfer of Care (Signed)
Immediate Anesthesia Transfer of Care Note  Patient: Margaretha SeedsBarbara V Deere  Procedure(s) Performed: VIDEO BRONCHOSCOPY FOR REMOVAL OF ENDOSCOPIC BRONCHIAL VALVE (N/A )  Patient Location: PACU  Anesthesia Type:General  Level of Consciousness: awake, alert  and oriented  Airway & Oxygen Therapy: Patient Spontanous Breathing and Patient connected to nasal cannula oxygen  Post-op Assessment: Report given to RN and Post -op Vital signs reviewed and stable  Post vital signs: Reviewed and stable  Last Vitals:  Vitals:   07/10/17 1159 07/10/17 1541  BP:    Pulse:  96  Resp:  (!) 21  Temp: 36.9 C 36.7 C  SpO2:  98%    Last Pain:  Vitals:   07/10/17 1159  TempSrc: Oral      Patients Stated Pain Goal: 3 (07/10/17 1204)  Complications: No apparent anesthesia complications

## 2017-07-10 NOTE — Anesthesia Procedure Notes (Signed)
Procedure Name: Intubation Date/Time: 07/10/2017 2:36 PM Performed by: Pearson GrippeOBERTSON, Joelly Bolanos M Pre-anesthesia Checklist: Patient identified, Emergency Drugs available, Suction available and Patient being monitored Patient Re-evaluated:Patient Re-evaluated prior to induction Oxygen Delivery Method: Circle system utilized Preoxygenation: Pre-oxygenation with 100% oxygen Induction Type: IV induction Ventilation: Mask ventilation without difficulty Laryngoscope Size: Miller and 2 Grade View: Grade I Tube type: Oral Tube size: 8.5 mm Number of attempts: 1 Airway Equipment and Method: Stylet and Oral airway Placement Confirmation: ETT inserted through vocal cords under direct vision,  positive ETCO2 and breath sounds checked- equal and bilateral Secured at: 22 cm Tube secured with: Tape Dental Injury: Teeth and Oropharynx as per pre-operative assessment

## 2017-07-10 NOTE — Anesthesia Preprocedure Evaluation (Signed)
Anesthesia Evaluation  Patient identified by MRN, date of birth, ID band Patient awake    Reviewed: Allergy & Precautions, NPO status , Patient's Chart, lab work & pertinent test results  Airway Mallampati: I  TM Distance: >3 FB Neck ROM: Full    Dental   Pulmonary asthma , former smoker,    Pulmonary exam normal        Cardiovascular Normal cardiovascular exam Rhythm:Regular     Neuro/Psych  Headaches, Anxiety Depression    GI/Hepatic hiatal hernia, GERD  Medicated and Controlled,  Endo/Other    Renal/GU      Musculoskeletal   Abdominal   Peds  Hematology  (+) anemia ,   Anesthesia Other Findings   Reproductive/Obstetrics                             Anesthesia Physical  Anesthesia Plan  ASA: III  Anesthesia Plan: General   Post-op Pain Management:    Induction: Intravenous  PONV Risk Score and Plan: 2 and Ondansetron, Dexamethasone and Treatment may vary due to age or medical condition  Airway Management Planned: Oral ETT  Additional Equipment:   Intra-op Plan: Utilization Of Total Body Hypothermia per surgeon request  Post-operative Plan: Extubation in OR  Informed Consent: I have reviewed the patients History and Physical, chart, labs and discussed the procedure including the risks, benefits and alternatives for the proposed anesthesia with the patient or authorized representative who has indicated his/her understanding and acceptance.     Plan Discussed with: CRNA and Surgeon  Anesthesia Plan Comments:         Anesthesia Quick Evaluation

## 2017-07-11 NOTE — Op Note (Signed)
NAMLenore Cordia:  Rone, Laury              ACCOUNT NO.:  [id number]  MEDICAL RECORD NO.:  [id number]  LOCATION:  D32C                         FACILITY:  [facility]  PHYSICIAN:  Kerin PernaPeter Van Trigt, M.D.  DATE OF BIRTH:  1951-03-24  DATE OF PROCEDURE:  07/10/2017 DATE OF DISCHARGE:                              OPERATIVE REPORT   OPERATION:  Video bronchoscopy with removal of 2 endobronchial valves from the right upper lobe.  ANESTHESIA:  General.  SURGEON:  Kerin PernaPeter Van Trigt, MD.  PREOPERATIVE DIAGNOSIS:  History of previous spontaneous pneumothorax, treated with chest tube and subsequent endobronchial valves and the 2 segments of the right upper lobe.  POSTOPERATIVE DIAGNOSIS:  History of previous spontaneous pneumothorax, treated with chest tube and subsequent endobronchial valves and the 2 segments of the right upper lobe.  DESCRIPTION OF PROCEDURE:  The patient had been prepared as an outpatient, the procedure, benefits, and risks previously discussed and informed consent was obtained.  The patient was brought from the preop assessment area directly to the operating room and placed supine on operative table.  General anesthesia was induced without difficulty.  A chest x-ray was brought up on the monitor to confirm the presence of 2 endobronchial valves in the right upper lobe, which had been placed 2 months earlier by Dr. Dorris FetchHendrickson to help her spontaneous pneumothorax heal.  The patient has history of severe end-stage sarcoidosis.  A proper time-out was performed.  The fiberoptic scope was passed using video imaging.  There were some mucoid secretions at the distal trachea and carina, which were cleared.  The left mainstem bronchus was clear. The right mainstem bronchus was clear.  The segments of the right middle and right lower lobe had some slight erythema and some airway secretions, which were minimal.  The right upper lobe was examined. There were 2 endobronchial valves and 2  segments.  These had some mucoid material around them.  Using the biopsy forceps, they were both engaged with forceps and removed in their entirety in succession and submitted to Pathology.  The airway was irrigated and there was no significant bleeding.  The bronchoscope was withdrawn.  A chest x-ray was taken in the operating room and confirmed that there was no pneumothorax or any other retained valves.  The patient was then extubated and returned to the recovery room for monitoring.     Kerin PernaPeter Van Trigt, M.D.     PV/MEDQ  D:  07/10/2017  T:  07/11/2017  Job:  [id number]  cc:   Oretha Milchakesh V Alva, MD

## 2017-07-12 NOTE — Anesthesia Postprocedure Evaluation (Signed)
Anesthesia Post Note  Patient: [name]  Procedure(s) Performed: VIDEO BRONCHOSCOPY FOR REMOVAL OF ENDOSCOPIC BRONCHIAL VALVE (N/A )     Patient location during evaluation: PACU Anesthesia Type: General Level of consciousness: sedated and patient cooperative Pain management: pain level controlled Vital Signs Assessment: post-procedure vital signs reviewed and stable Respiratory status: spontaneous breathing Cardiovascular status: stable Anesthetic complications: no    Last Vitals:  Vitals:   07/10/17 1615 07/10/17 1630  BP: 112/65 121/80  Pulse: 92 92  Resp: 17 19  Temp:  36.7 C  SpO2: 95% 100%    Last Pain:  Vitals:   07/10/17 1615  TempSrc:   PainSc: 0-No pain                 Lewie LoronJohn Easter Schinke

## 2017-07-13 ENCOUNTER — Encounter (HOSPITAL_COMMUNITY): Payer: Self-pay | Admitting: Cardiothoracic Surgery

## 2017-07-13 ENCOUNTER — Telehealth: Payer: Self-pay | Admitting: Pulmonary Disease

## 2017-07-13 NOTE — Telephone Encounter (Signed)
Called and spoke with pt and she is aware of RA recs.  Nothing further is needed.  

## 2017-07-13 NOTE — Telephone Encounter (Signed)
Take mucinex in am only Delsym cough syrup 5ml in pm

## 2017-07-13 NOTE — Telephone Encounter (Signed)
Spoke with pt, who reports of prod cough with clear to yellow mucus. Pt had removal of endoscopic bronchial valve on 07/10/17. Pt was instructed to take mucinex daily. Pt states she takes mucinex DM daily with no relief- pt states she had been taking mucinex prior to procedure too.  RA please advise. Thanks.

## 2017-07-16 ENCOUNTER — Telehealth: Payer: Self-pay | Admitting: Pulmonary Disease

## 2017-07-16 NOTE — Telephone Encounter (Signed)
LMTCB

## 2017-07-17 ENCOUNTER — Other Ambulatory Visit: Payer: Self-pay | Admitting: Family Medicine

## 2017-07-17 ENCOUNTER — Ambulatory Visit (INDEPENDENT_AMBULATORY_CARE_PROVIDER_SITE_OTHER): Payer: Medicare Other | Admitting: *Deleted

## 2017-07-17 ENCOUNTER — Encounter: Payer: Self-pay | Admitting: *Deleted

## 2017-07-17 ENCOUNTER — Ambulatory Visit: Payer: Medicare Other

## 2017-07-17 VITALS — BP 128/82 | HR 93 | Ht 65.0 in | Wt 201.4 lb

## 2017-07-17 DIAGNOSIS — Z0000 Encounter for general adult medical examination without abnormal findings: Secondary | ICD-10-CM | POA: Diagnosis not present

## 2017-07-17 DIAGNOSIS — I471 Supraventricular tachycardia, unspecified: Secondary | ICD-10-CM

## 2017-07-17 MED ORDER — BUPROPION HCL ER (XL) 300 MG PO TB24: 300.0000 mg | ORAL_TABLET | Freq: Every day | ORAL | 5 refills | Status: DC

## 2017-07-17 MED ORDER — BUDESONIDE-FORMOTEROL FUMARATE 160-4.5 MCG/ACT IN AERO: 2.0000 | INHALATION_SPRAY | Freq: Two times a day (BID) | RESPIRATORY_TRACT | 11 refills | Status: DC

## 2017-07-17 NOTE — Telephone Encounter (Signed)
Relation to ZO:XWRUpt:self Call back number:[phone] Pharmacy: [id number] - [place], [place] - [address] [phone] (Phone) [phone] (Fax)     Reason for call:  Patient requesting buPROPion (WELLBUTRIN XL) 300 MG 24 hr tablet refill, please advise

## 2017-07-17 NOTE — Patient Instructions (Signed)
Continue to eat heart healthy diet (full of fruits, vegetables, whole grains, lean protein, water--limit salt, fat, and sugar intake) and increase physical activity as tolerated.  Bring a copy of your living will and/or healthcare power of attorney to your next office visit.  Continue doing brain stimulating activities (puzzles, reading, adult coloring books, staying active) to keep memory sharp.    [name] , Thank you for taking time to come for your Medicare Wellness Visit. I appreciate your ongoing commitment to your health goals. Please review the following plan we discussed and let me know if I can assist you in the future.   These are the goals we discussed: Goals    . Weight (lb) < 170 lb (77.1 kg)     Do chair exercises and eat healthy        This is a list of the screening recommended for you and due dates:  Health Maintenance  Topic Date Due  . Mammogram  02/18/2018  . Cologuard (Stool DNA test)  12/17/2018  . Tetanus Vaccine  09/05/2020  . Flu Shot  Completed  . DEXA scan (bone density measurement)  Completed  .  Hepatitis C: One time screening is recommended by Center for Disease Control  (CDC) for  adults born from [birth year] through 1967.   Completed  . Pneumonia vaccines  Completed    Health Maintenance for Postmenopausal Women Menopause is a normal process in which your reproductive ability comes to an end. This process happens gradually over a span of months to years, usually between the ages of 104 and 36. Menopause is complete when you have missed 12 consecutive menstrual periods. It is important to talk with your health care provider about some of the most common conditions that affect postmenopausal women, such as heart disease, cancer, and bone loss (osteoporosis). Adopting a healthy lifestyle and getting preventive care can help to promote your health and wellness. Those actions can also lower your chances of developing some of these common conditions. What should  I know about menopause? During menopause, you may experience a number of symptoms, such as:  Moderate-to-severe hot flashes.  Night sweats.  Decrease in sex drive.  Mood swings.  Headaches.  Tiredness.  Irritability.  Memory problems.  Insomnia.  Choosing to treat or not to treat menopausal changes is an individual decision that you make with your health care provider. What should I know about hormone replacement therapy and supplements? Hormone therapy products are effective for treating symptoms that are associated with menopause, such as hot flashes and night sweats. Hormone replacement carries certain risks, especially as you become older. If you are thinking about using estrogen or estrogen with progestin treatments, discuss the benefits and risks with your health care provider. What should I know about heart disease and stroke? Heart disease, heart attack, and stroke become more likely as you age. This may be due, in part, to the hormonal changes that your body experiences during menopause. These can affect how your body processes dietary fats, triglycerides, and cholesterol. Heart attack and stroke are both medical emergencies. There are many things that you can do to help prevent heart disease and stroke:  Have your blood pressure checked at least every 1-2 years. High blood pressure causes heart disease and increases the risk of stroke.  If you are 46-78 years old, ask your health care provider if you should take aspirin to prevent a heart attack or a stroke.  Do not use any tobacco products, including  cigarettes, chewing tobacco, or electronic cigarettes. If you need help quitting, ask your health care provider.  It is important to eat a healthy diet and maintain a healthy weight. ? Be sure to include plenty of vegetables, fruits, low-fat dairy products, and lean protein. ? Avoid eating foods that are high in solid fats, added sugars, or salt (sodium).  Get regular  exercise. This is one of the most important things that you can do for your health. ? Try to exercise for at least 150 minutes each week. The type of exercise that you do should increase your heart rate and make you sweat. This is known as moderate-intensity exercise. ? Try to do strengthening exercises at least twice each week. Do these in addition to the moderate-intensity exercise.  Know your numbers.Ask your health care provider to check your cholesterol and your blood glucose. Continue to have your blood tested as directed by your health care provider.  What should I know about cancer screening? There are several types of cancer. Take the following steps to reduce your risk and to catch any cancer development as early as possible. Breast Cancer  Practice breast self-awareness. ? This means understanding how your breasts normally appear and feel. ? It also means doing regular breast self-exams. Let your health care provider know about any changes, no matter how small.  If you are 80 or older, have a clinician do a breast exam (clinical breast exam or CBE) every year. Depending on your age, family history, and medical history, it may be recommended that you also have a yearly breast X-ray (mammogram).  If you have a family history of breast cancer, talk with your health care provider about genetic screening.  If you are at high risk for breast cancer, talk with your health care provider about having an MRI and a mammogram every year.  Breast cancer (BRCA) gene test is recommended for women who have family members with BRCA-related cancers. Results of the assessment will determine the need for genetic counseling and BRCA1 and for BRCA2 testing. BRCA-related cancers include these types: ? Breast. This occurs in males or females. ? Ovarian. ? Tubal. This may also be called fallopian tube cancer. ? Cancer of the abdominal or pelvic lining (peritoneal  cancer). ? Prostate. ? Pancreatic.  Cervical, Uterine, and Ovarian Cancer Your health care provider may recommend that you be screened regularly for cancer of the pelvic organs. These include your ovaries, uterus, and vagina. This screening involves a pelvic exam, which includes checking for microscopic changes to the surface of your cervix (Pap test).  For women ages 21-65, health care providers may recommend a pelvic exam and a Pap test every three years. For women ages 66-65, they may recommend the Pap test and pelvic exam, combined with testing for human papilloma virus (HPV), every five years. Some types of HPV increase your risk of cervical cancer. Testing for HPV may also be done on women of any age who have unclear Pap test results.  Other health care providers may not recommend any screening for nonpregnant women who are considered low risk for pelvic cancer and have no symptoms. Ask your health care provider if a screening pelvic exam is right for you.  If you have had past treatment for cervical cancer or a condition that could lead to cancer, you need Pap tests and screening for cancer for at least 20 years after your treatment. If Pap tests have been discontinued for you, your risk factors (such as  having a new sexual partner) need to be reassessed to determine if you should start having screenings again. Some women have medical problems that increase the chance of getting cervical cancer. In these cases, your health care provider may recommend that you have screening and Pap tests more often.  If you have a family history of uterine cancer or ovarian cancer, talk with your health care provider about genetic screening.  If you have vaginal bleeding after reaching menopause, tell your health care provider.  There are currently no reliable tests available to screen for ovarian cancer.  Lung Cancer Lung cancer screening is recommended for adults 65-83 years old who are at high risk for  lung cancer because of a history of smoking. A yearly low-dose CT scan of the lungs is recommended if you:  Currently smoke.  Have a history of at least 30 pack-years of smoking and you currently smoke or have quit within the past 15 years. A pack-year is smoking an average of one pack of cigarettes per day for one year.  Yearly screening should:  Continue until it has been 15 years since you quit.  Stop if you develop a health problem that would prevent you from having lung cancer treatment.  Colorectal Cancer  This type of cancer can be detected and can often be prevented.  Routine colorectal cancer screening usually begins at age 90 and continues through age 71.  If you have risk factors for colon cancer, your health care provider may recommend that you be screened at an earlier age.  If you have a family history of colorectal cancer, talk with your health care provider about genetic screening.  Your health care provider may also recommend using home test kits to check for hidden blood in your stool.  A small camera at the end of a tube can be used to examine your colon directly (sigmoidoscopy or colonoscopy). This is done to check for the earliest forms of colorectal cancer.  Direct examination of the colon should be repeated every 5-10 years until age 86. However, if early forms of precancerous polyps or small growths are found or if you have a family history or genetic risk for colorectal cancer, you may need to be screened more often.  Skin Cancer  Check your skin from head to toe regularly.  Monitor any moles. Be sure to tell your health care provider: ? About any new moles or changes in moles, especially if there is a change in a mole's shape or color. ? If you have a mole that is larger than the size of a pencil eraser.  If any of your family members has a history of skin cancer, especially at a young age, talk with your health care provider about genetic  screening.  Always use sunscreen. Apply sunscreen liberally and repeatedly throughout the day.  Whenever you are outside, protect yourself by wearing long sleeves, pants, a wide-brimmed hat, and sunglasses.  What should I know about osteoporosis? Osteoporosis is a condition in which bone destruction happens more quickly than new bone creation. After menopause, you may be at an increased risk for osteoporosis. To help prevent osteoporosis or the bone fractures that can happen because of osteoporosis, the following is recommended:  If you are 18-8 years old, get at least 1,000 mg of calcium and at least 600 mg of vitamin D per day.  If you are older than age 61 but younger than age 49, get at least 1,200 mg of calcium and  at least 600 mg of vitamin D per day.  If you are older than age 54, get at least 1,200 mg of calcium and at least 800 mg of vitamin D per day.  Smoking and excessive alcohol intake increase the risk of osteoporosis. Eat foods that are rich in calcium and vitamin D, and do weight-bearing exercises several times each week as directed by your health care provider. What should I know about how menopause affects my mental health? Depression may occur at any age, but it is more common as you become older. Common symptoms of depression include:  Low or sad mood.  Changes in sleep patterns.  Changes in appetite or eating patterns.  Feeling an overall lack of motivation or enjoyment of activities that you previously enjoyed.  Frequent crying spells.  Talk with your health care provider if you think that you are experiencing depression. What should I know about immunizations? It is important that you get and maintain your immunizations. These include:  Tetanus, diphtheria, and pertussis (Tdap) booster vaccine.  Influenza every year before the flu season begins.  Pneumonia vaccine.  Shingles vaccine.  Your health care provider may also recommend other  immunizations. This information is not intended to replace advice given to you by your health care provider. Make sure you discuss any questions you have with your health care provider. Document Released: 10/17/2005 Document Revised: 03/14/2016 Document Reviewed: 05/29/2015 Elsevier Interactive Patient Education  2018 [employer].

## 2017-07-17 NOTE — Telephone Encounter (Signed)
Called and lmom to make the pt aware of refill of the symbicort that has been sent to the pharmacy.

## 2017-07-17 NOTE — Telephone Encounter (Signed)
Medication sent to pharmacy  

## 2017-07-19 NOTE — Op Note (Signed)
NAMLenore Cordia:  Lindenberger, Sheniya              ACCOUNT NO.:  [id number]  MEDICAL RECORD NO.:  [id number]  LOCATION:  [facility]                         FACILITY:  [facility]  PHYSICIAN:  Kerin PernaPeter Van Trigt, M.D.  DATE OF BIRTH:  Dec 03, 1950  DATE OF PROCEDURE:  07/10/2017 DATE OF DISCHARGE:  07/10/2017                              OPERATIVE REPORT   OPERATIONS:  Video bronchoscopy, removal of endobronchial valve from right upper lobe.  SURGEON:  Kerin PernaPeter Van Trigt, MD.  ANESTHESIA:  General.  PREOPERATIVE DIAGNOSIS:  History of sarcoidosis and spontaneous pneumothorax requiring placement of endobronchial valve for resolution of air leak.  POSTOPERATIVE DIAGNOSIS:  History of sarcoidosis and spontaneous pneumothorax requiring placement of endobronchial valve for resolution of air leak.  DESCRIPTION OF PROCEDURE:  The patient was brought to the operating room after informed consent obtained and the procedure had been completely explained to the patient and family including benefits and risks.  She was placed supine on the operating table.  General anesthesia was induced.  Through the endobronchial tube after proper time-out, a bronchoscope was passed.  Some mild secretions were removed from the carina and mainstem bronchi.  The bronchoscope was then advanced into the segments of the right upper lobe.  Two endobronchial valves were isolated.  Each was grabbed with a biopsy forceps and removed in their entirety.  There was minimal bleeding and the patient tolerated the procedure well.  She was extubated and returned to the recovery room in stable condition.     Kerin PernaPeter Van Trigt, M.D.     PV/MEDQ  D:  07/19/2017  T:  07/19/2017  Job:  [id number]

## 2017-07-23 ENCOUNTER — Ambulatory Visit: Payer: Self-pay | Admitting: Pulmonary Disease

## 2017-07-25 ENCOUNTER — Encounter: Payer: Self-pay | Admitting: Family Medicine

## 2017-07-25 DIAGNOSIS — J45909 Unspecified asthma, uncomplicated: Secondary | ICD-10-CM | POA: Diagnosis not present

## 2017-07-25 DIAGNOSIS — D869 Sarcoidosis, unspecified: Secondary | ICD-10-CM | POA: Diagnosis not present

## 2017-08-06 ENCOUNTER — Telehealth: Payer: Self-pay | Admitting: Family Medicine

## 2017-08-06 NOTE — Telephone Encounter (Signed)
Copied from CRM [id number]. Topic: Quick Communication - See Telephone Encounter >> Aug 06, 2017  9:57 AM Oneal GroutSebastian, Jennifer S wrote: CRM for notification. See Telephone encounter for:  Requesting to know if she can claritin 2 times daily? Having sinus problems 08/06/17.

## 2017-08-10 ENCOUNTER — Telehealth: Payer: Self-pay | Admitting: Family Medicine

## 2017-08-10 NOTE — Telephone Encounter (Signed)
Noted  

## 2017-08-10 NOTE — Telephone Encounter (Signed)
Made an appointment for a physical per pt request.

## 2017-08-10 NOTE — Telephone Encounter (Signed)
Pt called to see how she was doing with her sinus problems. She stated she was better, doing the humidifier, neti pot and taking her Claritin. She wanted to know if she could take it more than once a day. I advised her not and to check with her pharmacist regarding other meds for nasal congestion. She stated that she would.

## 2017-08-19 ENCOUNTER — Encounter: Payer: Self-pay | Admitting: Family Medicine

## 2017-08-24 DIAGNOSIS — D869 Sarcoidosis, unspecified: Secondary | ICD-10-CM | POA: Diagnosis not present

## 2017-08-24 DIAGNOSIS — J45909 Unspecified asthma, uncomplicated: Secondary | ICD-10-CM | POA: Diagnosis not present

## 2017-08-26 ENCOUNTER — Encounter: Payer: Self-pay | Admitting: Family Medicine

## 2017-09-06 NOTE — Progress Notes (Deleted)
[facility] [address], [address] [place], [place] [address] [phone] [phone]  Date:  09/09/2017   Name:  Margaretha SeedsBarbara V Falotico   DOB:  1951/07/05   MRN:  [id number]  PCP:  Pearline Cablesopland, Bjorn Hallas C, MD    Chief Complaint: No chief complaint on file.   History of Present Illness:  Margaretha SeedsBarbara V Ullery is a 66 y.o. very pleasant female patient who presents with the following:  Last seen by myself in September of 2017  Here today to follow-up from recent admission for a pneumothorax during which she underwent a chest tube and a "talc procedure" for her lungs.  She was then seen in the ER last week when Dr. Sherene SiresWert was concerned about EKG changes but all turned out to be ok She is getting back to her baseline currently, is regaining strength, but has lost a good bit of weight Encouraged her to work on eating enough protein, she is drinking ensure shakes when she does not feel like eating a meal Advised that the decision to consider lung transplant is beyond my expertise, but I encouraged them to discuss with her pulmonologist, Dr. Vassie LollAlva, at next visit She will see me in 4-6 months- sooner if I can help her in any way  Patient Active Problem List   Diagnosis Date Noted  . Other chest pain   . Palliative care by specialist   . Spontaneous pneumothorax 04/18/2017  . SOB (shortness of breath) 06/05/2016  . Osteopenia 02/12/2016  . Lumbar radiculopathy 01/26/2015  . Chronic respiratory failure with hypoxia (HCC) 04/12/2014  . Obesity (BMI 30-39.9) 02/08/2014  . Allergic rhinitis 09/05/2010  . IBS 09/05/2010  . GERD 02/18/2010  . Barrett's esophagus 02/12/2010  . Upper airway cough syndrome 11/02/2009  . Anxiety state 08/29/2008  . History of colonic polyps 08/29/2008  . PULMONARY SARCOIDOSIS 08/12/2007  . Mild persistent asthma in adult without complication with component of vcd 08/12/2007  . Diaphragmatic hernia 08/12/2007  . Migraine 08/12/2007     Past Medical History:  Diagnosis Date  . Anemia   . Anxiety   . Asthma   . Barrett esophagus   . Depression   . GERD (gastroesophageal reflux disease)   . Hiatal hernia   . IBS (irritable bowel syndrome)   . Migraines   . Pneumothorax   . Pulmonary sarcoidosis (HCC)   . Supplemental oxygen dependent   . Wears dentures     Past Surgical History:  Procedure Laterality Date  . APPENDECTOMY    . BRAVO PH STUDY N/A 10/10/2013   Procedure: BRAVO PH STUDY;  Surgeon: Hart Carwinora M Brodie, MD;  Location: [facility];  Service: Endoscopy;  Laterality: N/A;  placed 27  . BRONCHIAL BIOPSY  1996  . CATARACT EXTRACTION, BILATERAL    . CHEST TUBE INSERTION Right 04/23/2017   Procedure: CHEST TUBE INSERTION;  Surgeon: Kerin PernaVan Trigt, Peter, MD;  Location: [facility] OR;  Service: Thoracic;  Laterality: Right;  . CHOLECYSTECTOMY    . ESOPHAGOGASTRODUODENOSCOPY N/A 10/10/2013   Procedure: ESOPHAGOGASTRODUODENOSCOPY (EGD);  Surgeon: Hart Carwinora M Brodie, MD;  [facility];  Service: Endoscopy;  Laterality: N/A;  pt asthmatic, RA 92% at rest.  Coughing frequently. Wheezes noted in upper lung fields at auscultation. Pt instructed to use home inhaler prior to procedure and placed on supportive O2 @ 2 lpm in pre-procedural. Teaching done ie. use of inhaler. Pt states she ju  . NISSEN FUNDOPLICATION    . ROTATOR  CUFF REPAIR     x3(2 on L, 1 on R)  . VIDEO BRONCHOSCOPY WITH INSERTION OF INTERBRONCHIAL VALVE (IBV) N/A 05/08/2017   Procedure: VIDEO BRONCHOSCOPY WITH INSERTION OF INTERBRONCHIAL VALVE (IBV);  Surgeon: Loreli Slot, MD;  Location: [facility] OR;  Service: Thoracic;  Laterality: N/A;  . VIDEO BRONCHOSCOPY WITH INSERTION OF INTERBRONCHIAL VALVE (IBV) N/A 07/10/2017   Procedure: VIDEO BRONCHOSCOPY FOR REMOVAL OF ENDOSCOPIC BRONCHIAL VALVE;  Surgeon: [name], MD;  Location: [facility] OR;  Service: Thoracic;  Laterality: N/A;    Social History   Tobacco Use  . Smoking status: Former Smoker    Packs/day:  1.00    Years: 20.00    Pack years: 20.00    Types: Cigarettes    Last attempt to quit: 09/08/1978    Years since quitting: 39.0  . Smokeless tobacco: Never Used  Substance Use Topics  . Alcohol use: No  . Drug use: No    Family History  Adopted: Yes    Allergies  Allergen Reactions  . Gabapentin Swelling    EDEMA WITH POSITIVE RECHALLENGE  . Lactose Intolerance (Gi) Diarrhea and Other (See Comments)    Stomach cramps  . Pineapple Hives and Itching  . Penicillins Other (See Comments)    Unknown - childhood allergy Has patient had a PCN reaction causing immediate rash, facial/tongue/throat swelling, SOB or lightheadedness with hypotension: Unknown Has patient had a PCN reaction causing severe rash involving mucus membranes or skin necrosis: Unknown Has patient had a PCN reaction that required hospitalization: Unknown Has patient had a PCN reaction occurring within the last 10 years: No If all of the above answers are "NO", then may proceed with Cephalosporin use.  . Aspirin Other (See Comments)    gastritis    Medication list has been reviewed and updated.  Current Outpatient Medications on File Prior to Visit  Medication Sig Dispense Refill  . albuterol (VENTOLIN HFA) 108 (90 Base) MCG/ACT inhaler Inhale 2 puffs into the lungs as needed. (Patient taking differently: Inhale 2 puffs into the lungs every 4 (four) hours as needed for wheezing or shortness of breath. ) 1 Inhaler 2  . ALOE VERA JUICE PO Take 120 mLs by mouth daily.     . Ascorbic Acid (VITAMIN C PO) Take 1 tablet by mouth daily. 1000 MG    . ASTRAGALUS PO Take 1 tablet by mouth daily.     . budesonide-formoterol (SYMBICORT) 160-4.5 MCG/ACT inhaler Inhale 2 puffs 2 (two) times daily into the lungs. 1 Inhaler 11  . buPROPion (WELLBUTRIN XL) 300 MG 24 hr tablet Take 1 tablet (300 mg total) daily by mouth. 30 tablet 5  . Calcium Carbonate-Vitamin D (CALCIUM-D PO) Take 1 tablet by mouth daily.    . Collagen  Hydrolysate POWD Take 1 scoop by mouth daily with supper.     . Cyanocobalamin (VITAMIN B-12) 5000 MCG TBDP Take 5,000 mcg by mouth daily.    . cycloSPORINE (RESTASIS) 0.05 % ophthalmic emulsion Place 1 drop into both eyes 2 (two) times daily.     . fluticasone (FLONASE) 50 MCG/ACT nasal spray Place 1 spray into both nostrils 2 (two) times daily.     . furosemide (LASIX) 20 MG tablet TAKE 1 TABLET (20 MG TOTAL) BY MOUTH DAILY. USE AS NEEDED FOR SWELLING (Patient taking differently: Take 20 mg by mouth daily as needed (swelling). ) 90 tablet 1  . guaiFENesin (MUCINEX) 600 MG 12 hr tablet Take 1 tablet by mouth every 12 (twelve)  hours as needed (cough).    Marland Kitchen. ipratropium (ATROVENT HFA) 17 MCG/ACT inhaler Inhale 2 puffs into the lungs every 6 (six) hours as needed for wheezing. 1 Inhaler 1  . loratadine (CLARITIN) 10 MG tablet Take 10 mg by mouth daily.    Marland Kitchen. LORazepam (ATIVAN) 0.5 MG tablet TAKE 1/2 TO 1 TABLET BY MOUTH EVERY 8 TO 12 HRS ONLY AS NEEDED (Patient taking differently: TAKE 1/2 TO 1 TABLET (0.25 - 0.5 MG) BY MOUTH EVERY 8 TO 12 HRS ONLY AS NEEDED FOR PANIC ATTACKS) 30 tablet 0  . Multiple Vitamin (MULTIVITAMIN WITH MINERALS) TABS tablet Take 1 tablet by mouth daily.    . Nasal Moisturizer Combination (RHINASE) SOLN Place 2 sprays into the nose 2 (two) times daily as needed (dry, bloody nostrils).     . Omega 3-6-9 Fatty Acids (OMEGA 3-6-9 COMPLEX PO) Take 1 capsule by mouth daily.    . OXYGEN Inhale 2 L into the lungs continuous.    . pantoprazole (PROTONIX) 20 MG tablet TAKE 1 TABLET (20 MG TOTAL) BY MOUTH DAILY. (Patient taking differently: TAKE 1 TABLET (20 MG TOTAL) BY MOUTH DAILY BEFORE BREAKFAST) 90 tablet 3  . polyvinyl alcohol (ARTIFICIAL TEARS) 1.4 % ophthalmic solution Place 1 drop into both eyes See admin instructions. Instill 1 drop in to both eyes once or twice daily as needed for dry eyes - in between restasis doses    . PREBIOTIC PRODUCT PO Take 1 scoop by mouth daily before  breakfast.     . ranitidine (ZANTAC) 150 MG tablet TAKE 1 TABLET (150 MG TOTAL) BY MOUTH 2 (TWO) TIMES DAILY. 120 tablet 2   No current facility-administered medications on file prior to visit.     Review of Systems:  As per HPI- otherwise negative.   Physical Examination: There were no vitals filed for this visit. There were no vitals filed for this visit. There is no height or weight on file to calculate BMI. Ideal Body Weight:    GEN: WDWN, NAD, Non-toxic, A & O x 3 HEENT: Atraumatic, Normocephalic. Neck supple. No masses, No LAD. Ears and Nose: No external deformity. CV: RRR, No M/G/R. No JVD. No thrill. No extra heart sounds. PULM: CTA B, no wheezes, crackles, rhonchi. No retractions. No resp. distress. No accessory muscle use. ABD: S, NT, ND, +BS. No rebound. No HSM. EXTR: No c/c/e NEURO Normal gait.  PSYCH: Normally interactive. Conversant. Not depressed or anxious appearing.  Calm demeanor.    Assessment and Plan: ***  Signed Abbe AmsterdamJessica Rosalynn Sergent, MD

## 2017-09-09 ENCOUNTER — Encounter: Payer: Self-pay | Admitting: Family Medicine

## 2017-09-09 DIAGNOSIS — Z0289 Encounter for other administrative examinations: Secondary | ICD-10-CM

## 2017-09-16 ENCOUNTER — Telehealth: Payer: Self-pay | Admitting: Pulmonary Disease

## 2017-09-16 NOTE — Telephone Encounter (Signed)
Spoke with patient. She was upset that no one told her that RA stopped coming to the [facility] office. Apologized to patient. Attempted to get her scheduled with BQ or VS but she declined.   Appt was made for RA in [place] for 10/19/17 at 2pm.   Nothing else needed at time of call.

## 2017-09-20 ENCOUNTER — Encounter (HOSPITAL_COMMUNITY): Payer: Self-pay | Admitting: Emergency Medicine

## 2017-09-20 ENCOUNTER — Emergency Department (HOSPITAL_COMMUNITY)
Admission: EM | Admit: 2017-09-20 | Discharge: 2017-09-21 | Disposition: A | Discharge status: Home / Self Care | Payer: Medicare Other | Attending: Emergency Medicine | Admitting: Emergency Medicine

## 2017-09-20 ENCOUNTER — Other Ambulatory Visit: Payer: Self-pay

## 2017-09-20 ENCOUNTER — Emergency Department (HOSPITAL_COMMUNITY): Payer: Medicare Other

## 2017-09-20 DIAGNOSIS — J45909 Unspecified asthma, uncomplicated: Secondary | ICD-10-CM | POA: Insufficient documentation

## 2017-09-20 DIAGNOSIS — Z79899 Other long term (current) drug therapy: Secondary | ICD-10-CM | POA: Diagnosis not present

## 2017-09-20 DIAGNOSIS — R0789 Other chest pain: Secondary | ICD-10-CM | POA: Diagnosis not present

## 2017-09-20 DIAGNOSIS — R079 Chest pain, unspecified: Secondary | ICD-10-CM | POA: Diagnosis not present

## 2017-09-20 LAB — CBC
HCT: 40.4 % (ref 36.0–46.0)
Hemoglobin: 12.9 g/dL (ref 12.0–15.0)
MCH: 30.1 pg (ref 26.0–34.0)
MCHC: 31.9 g/dL (ref 30.0–36.0)
MCV: 94.4 fL (ref 78.0–100.0)
PLATELETS: 310 10*3/uL (ref 150–400)
RBC: 4.28 MIL/uL (ref 3.87–5.11)
RDW: 13 % (ref 11.5–15.5)
WBC: 8.3 10*3/uL (ref 4.0–10.5)

## 2017-09-20 LAB — BASIC METABOLIC PANEL
Anion gap: 13 (ref 5–15)
BUN: 9 mg/dL (ref 6–20)
CALCIUM: 9.7 mg/dL (ref 8.9–10.3)
CHLORIDE: 99 mmol/L — AB (ref 101–111)
CO2: 24 mmol/L (ref 22–32)
CREATININE: 1.16 mg/dL — AB (ref 0.44–1.00)
GFR, EST AFRICAN AMERICAN: 55 mL/min — AB (ref >60)
GFR, EST NON AFRICAN AMERICAN: 47 mL/min — AB (ref >60)
Glucose, Bld: 115 mg/dL — ABNORMAL HIGH (ref 65–99)
Potassium: 4.3 mmol/L (ref 3.5–5.1)
SODIUM: 136 mmol/L (ref 135–145)

## 2017-09-20 LAB — I-STAT TROPONIN, ED: TROPONIN I, POC: 0 ng/mL (ref 0.00–0.08)

## 2017-09-20 MED ORDER — VALACYCLOVIR HCL 1 G PO TABS: 1000.0000 mg | ORAL_TABLET | Freq: Three times a day (TID) | ORAL | 0 refills | Status: DC

## 2017-09-20 MED ORDER — ONDANSETRON HCL 4 MG/2ML IJ SOLN
4.0000 mg | Freq: Once | INTRAMUSCULAR | Status: AC
  Administered 2017-09-20: 4 mg via INTRAVENOUS
  Filled 2017-09-20: qty 2

## 2017-09-20 MED ORDER — FENTANYL CITRATE (PF) 100 MCG/2ML IJ SOLN
50.0000 ug | Freq: Once | INTRAMUSCULAR | Status: AC
  Administered 2017-09-20: 50 ug via INTRAVENOUS
  Filled 2017-09-20: qty 2

## 2017-09-20 MED ORDER — OXYCODONE-ACETAMINOPHEN 5-325 MG PO TABS: 1.0000 | ORAL_TABLET | ORAL | 0 refills | Status: DC | PRN

## 2017-09-20 MED ORDER — IOPAMIDOL (ISOVUE-370) INJECTION 76%
INTRAVENOUS | Status: AC
  Administered 2017-09-20: 100 mL via INTRAVENOUS
  Filled 2017-09-20: qty 100

## 2017-09-20 NOTE — ED Provider Notes (Signed)
[facility] EMERGENCY DEPARTMENT Provider Note   CSN: [id number] Arrival date & time: 09/20/17  2033     History   Chief Complaint Chief Complaint  Patient presents with  . Chest Pain    HPI IRIEL NASON is a 67 y.o. female who presents the emergency department chief complaint of sharp right-sided chest pain.  She has a past medical history of sarcoid and advanced pulmonary fibrosis.  She is on oxygen at all times.  Patient states about 1 week ago she developed achiness along the right anterior chest wall just under her breast.  Over the past 24 hours it became severe, burning, sharp, worse with deep breathing.  She denies hemoptysis.  She has a history of a previous pneumothorax and states that the pain she is having is worse.  She denies irruption of any rashes, nausea or diaphoresis.  She feels short of breath because of the pain she is to breathe against.  She denies fevers or chills.  HPI  Past Medical History:  Diagnosis Date  . Anemia   . Anxiety   . Asthma   . Barrett esophagus   . Depression   . GERD (gastroesophageal reflux disease)   . Hiatal hernia   . IBS (irritable bowel syndrome)   . Migraines   . Pneumothorax   . Pulmonary sarcoidosis (HCC)   . Supplemental oxygen dependent   . Wears dentures     Patient Active Problem List   Diagnosis Date Noted  . Other chest pain   . Palliative care by specialist   . Spontaneous pneumothorax 04/18/2017  . SOB (shortness of breath) 06/05/2016  . Osteopenia 02/12/2016  . Lumbar radiculopathy 01/26/2015  . Chronic respiratory failure with hypoxia (HCC) 04/12/2014  . Obesity (BMI 30-39.9) 02/08/2014  . Allergic rhinitis 09/05/2010  . IBS 09/05/2010  . GERD 02/18/2010  . Barrett's esophagus 02/12/2010  . Upper airway cough syndrome 11/02/2009  . Anxiety state 08/29/2008  . History of colonic polyps 08/29/2008  . PULMONARY SARCOIDOSIS 08/12/2007  . Mild persistent asthma in adult without  complication with component of vcd 08/12/2007  . Diaphragmatic hernia 08/12/2007  . Migraine 08/12/2007    Past Surgical History:  Procedure Laterality Date  . APPENDECTOMY    . BRAVO PH STUDY N/A 10/10/2013   Procedure: BRAVO PH STUDY;  Surgeon: Hart Carwin, MD;  Location: [facility];  Service: Endoscopy;  Laterality: N/A;  placed 27  . BRONCHIAL BIOPSY  1996  . CATARACT EXTRACTION, BILATERAL    . CHEST TUBE INSERTION Right 04/23/2017   Procedure: CHEST TUBE INSERTION;  Surgeon: [name], MD;  Location[facility] OR;  Service: Thoracic;  Laterality: Right;  . CHOLECYSTECTOMY    . ESOPHAGOGASTRODUODENOSCOPY N/A 10/10/2013   Procedure: ESOPHAGOGASTRODUODENOSCOPY (EGD);  Surgeon: Hart Carwin, MD;  Location[facility];  Service: Endoscopy;  Laterality: N/A;  pt asthmatic, RA 92% at rest.  Coughing frequently. Wheezes noted in upper lung fields at auscultation. Pt instructed to use home inhaler prior to procedure and placed on supportive O2 @ 2 lpm in pre-procedural. Teaching done ie. use of inhaler. Pt states she ju  . NISSEN FUNDOPLICATION    . ROTATOR CUFF REPAIR     x3(2 on L, 1 on R)  . VIDEO BRONCHOSCOPY WITH INSERTION OF INTERBRONCHIAL VALVE (IBV) N/A 05/08/2017   Procedure: VIDEO BRONCHOSCOPY WITH INSERTION OF INTERBRONCHIAL VALVE (IBV);  Surgeon: Loreli Slot, MD;  Location: [facility] OR;  Service: Thoracic;  Laterality: N/A;  .  VIDEO BRONCHOSCOPY WITH INSERTION OF INTERBRONCHIAL VALVE (IBV) N/A 07/10/2017   Procedure: VIDEO BRONCHOSCOPY FOR REMOVAL OF ENDOSCOPIC BRONCHIAL VALVE;  Surgeon: Kerin PernaVan Trigt, Peter, MD;  [facility];  Service: Thoracic;  Laterality: N/A;    OB History    No data available       Home Medications    Prior to Admission medications   Medication Sig Start Date End Date Taking? Authorizing Provider  albuterol (VENTOLIN HFA) 108 (90 Base) MCG/ACT inhaler Inhale 2 puffs into the lungs as needed. Patient taking differently: Inhale 2 puffs into the  lungs every 4 (four) hours as needed for wheezing or shortness of breath.  12/11/16  Yes Copland, Gwenlyn FoundJessica C, MD  ALOE VERA JUICE PO Take 120 mLs by mouth daily.    Yes [provider]  Ascorbic Acid (VITAMIN C PO) Take 1 tablet by mouth daily. 1000 MG   Yes [provider]  ASTRAGALUS PO Take 1 tablet by mouth daily.    Yes [provider]  budesonide-formoterol (SYMBICORT) 160-4.5 MCG/ACT inhaler Inhale 2 puffs 2 (two) times daily into the lungs. 07/17/17  Yes Oretha MilchAlva, Rakesh V, MD  buPROPion (WELLBUTRIN XL) 300 MG 24 hr tablet Take 1 tablet (300 mg total) daily by mouth. 07/17/17  Yes Copland, Gwenlyn FoundJessica C, MD  Calcium Carbonate-Vitamin D (CALCIUM-D PO) Take 1 tablet by mouth daily.   Yes [provider]  Collagen Hydrolysate POWD Take 1 scoop by mouth daily with supper.    Yes [provider]  Cyanocobalamin (VITAMIN B-12) 5000 MCG TBDP Take 5,000 mcg by mouth daily.   Yes [provider]  cycloSPORINE (RESTASIS) 0.05 % ophthalmic emulsion Place 1 drop into both eyes 2 (two) times daily.    Yes [provider]  fluticasone (FLONASE) 50 MCG/ACT nasal spray Place 1 spray into both nostrils 2 (two) times daily.    Yes [provider]  furosemide (LASIX) 20 MG tablet TAKE 1 TABLET (20 MG TOTAL) BY MOUTH DAILY. USE AS NEEDED FOR SWELLING Patient taking differently: Take 20 mg by mouth daily as needed (swelling).  09/18/16  Yes Copland, Gwenlyn FoundJessica C, MD  guaiFENesin (MUCINEX) 600 MG 12 hr tablet Take 1 tablet by mouth daily.    Yes [provider]  ipratropium (ATROVENT HFA) 17 MCG/ACT inhaler Inhale 2 puffs into the lungs every 6 (six) hours as needed for wheezing. 06/27/16  Yes Saguier, Ramon DredgeEdward, PA-C  loratadine (CLARITIN) 10 MG tablet Take 10 mg by mouth daily.   Yes [provider]  LORazepam (ATIVAN) 0.5 MG tablet TAKE 1/2 TO 1 TABLET BY MOUTH EVERY 8 TO 12 HRS ONLY AS NEEDED Patient taking differently: TAKE 1/2 TO 1 TABLET  (0.25 - 0.5 MG) BY MOUTH EVERY 8 TO 12 HRS ONLY AS NEEDED FOR PANIC ATTACKS 09/18/16  Yes Copland, Gwenlyn FoundJessica C, MD  Multiple Vitamin (MULTIVITAMIN WITH MINERALS) TABS tablet Take 1 tablet by mouth daily.   Yes [provider]  Nasal Moisturizer Combination (RHINASE) SOLN Place 2 sprays into the nose 2 (two) times daily as needed (dry, bloody nostrils).    Yes [provider]  Omega 3-6-9 Fatty Acids (OMEGA 3-6-9 COMPLEX PO) Take 1 capsule by mouth daily.   Yes [provider]  OXYGEN Inhale 2-4 L into the lungs continuous. If moving around it is increased   Yes [provider]  pantoprazole (PROTONIX) 20 MG tablet TAKE 1 TABLET (20 MG TOTAL) BY MOUTH DAILY. Patient taking differently: TAKE 1 TABLET (20 MG  TOTAL) BY MOUTH DAILY BEFORE BREAKFAST 03/16/17  Yes Armbruster, Willaim Rayas, MD  phenylephrine (SUDAFED PE) 10 MG TABS tablet Take 10 mg by mouth daily.   Yes [provider]  polyvinyl alcohol (ARTIFICIAL TEARS) 1.4 % ophthalmic solution Place 1 drop into both eyes See admin instructions. Instill 1 drop in to both eyes once or twice daily as needed for dry eyes - in between restasis doses   Yes [provider]  PREBIOTIC PRODUCT PO Take 1 scoop by mouth daily before breakfast.    Yes [provider]  ranitidine (ZANTAC) 150 MG tablet TAKE 1 TABLET (150 MG TOTAL) BY MOUTH 2 (TWO) TIMES DAILY. 05/18/17  Yes Armbruster, Willaim Rayas, MD    Family History Family History  Adopted: Yes    Social History Social History   Tobacco Use  . Smoking status: Former Smoker    Packs/day: 1.00    Years: 20.00    Pack years: 20.00    Types: Cigarettes    Last attempt to quit: 09/08/1978    Years since quitting: 39.0  . Smokeless tobacco: Never Used  Substance Use Topics  . Alcohol use: No  . Drug use: No     Allergies   Gabapentin; Lactose intolerance (gi); Pineapple; Penicillins; and Aspirin   Review of Systems Review of Systems Ten systems  reviewed and are negative for acute change, except as noted in the HPI.    Physical Exam Updated Vital Signs BP 118/79   Pulse (!) 103   Temp 98.8 F (37.1 C) (Oral)   Resp 17   Ht 5\' 4"  (1.626 m)   Wt 90.7 kg (200 lb)   SpO2 98%   BMI 34.33 kg/m   Physical Exam  Constitutional: She is oriented to person, place, and time. She appears well-developed and well-nourished. No distress.  HENT:  Head: Normocephalic and atraumatic.  Eyes: Conjunctivae and EOM are normal. Pupils are equal, round, and reactive to light. No scleral icterus.  Neck: Normal range of motion.  Cardiovascular: Normal rate, regular rhythm, normal heart sounds and normal pulses. Exam reveals no gallop and no friction rub.  No murmur heard. Pulmonary/Chest: Effort normal and breath sounds normal. No respiratory distress. She has no decreased breath sounds. She has no wheezes. She exhibits tenderness.  Tender to palpation along the anterior right chest wall under the breast.  No evidence of rash  Abdominal: Soft. Bowel sounds are normal. She exhibits no distension and no mass. There is no tenderness. There is no guarding.  Neurological: She is alert and oriented to person, place, and time.  Skin: Skin is warm and dry. She is not diaphoretic.  Psychiatric: Her behavior is normal.  Nursing note and vitals reviewed.    ED Treatments / Results  Labs (all labs ordered are listed, but only abnormal results are displayed) Labs Reviewed  BASIC METABOLIC PANEL - Abnormal; Notable for the following components:      Result Value   Chloride 99 (*)    Glucose, Bld 115 (*)    Creatinine, Ser 1.16 (*)    GFR calc non Af Amer 47 (*)    GFR calc Af Amer 55 (*)    All other components within normal limits  CBC  I-STAT TROPONIN, ED    EKG  EKG Interpretation None       Radiology Dg Chest 2 View  Result Date: 09/20/2017 CLINICAL DATA:  Right-sided chest pain.  History of sarcoidosis. EXAM: CHEST  2 VIEW  COMPARISON:  07/10/2017 FINDINGS: The cardiac silhouette, mediastinal and hilar contours are stable. Stable mediastinal and hilar adenopathy. Stable severe chronic lung disease with sarcoidosis related pulmonary fibrosis. No acute overlying pulmonary findings. The bony thorax is intact. IMPRESSION: Severe chronic lung disease but no acute overlying pulmonary findings. Electronically Signed   By: Rudie Meyer M.D.   On: 09/20/2017 21:31    Procedures Procedures (including critical care time)  Medications Ordered in ED Medications  fentaNYL (SUBLIMAZE) injection 50 mcg (50 mcg Intravenous Given 09/20/17 2201)  ondansetron (ZOFRAN) injection 4 mg (4 mg Intravenous Given 09/20/17 2201)  iopamidol (ISOVUE-370) 76 % injection (100 mLs Intravenous Contrast Given 09/20/17 2228)     Initial Impression / Assessment and Plan / ED Course  I have reviewed the triage vital signs and the nursing notes.  Pertinent labs & imaging results that were available during my care of the patient were reviewed by me and considered in my medical decision making (see chart for details).     Patient with chest wall pain.  It appears to be in dermatomal pattern and have concern for on erupted shingles given her burning severe pain.  Patient will be started on Valtrex and pain medications.  Her chest x-ray and CT angios show no pulmonary embolus or other acute changes.  She has reproducible chest wall pain.  Patient is advised to follow-up outpatient with her PCP.  She appears appropriate for discharge on her normal level of home oxygen.  Seen and shared visit with Dr. Jacqulyn Bath  Final Clinical Impressions(s) / ED Diagnoses   Final diagnoses:  None    ED Discharge Orders    None       Arthor Captain, PA-C 09/20/17 2339    Maia Plan, MD 09/21/17 1002

## 2017-09-20 NOTE — ED Triage Notes (Signed)
Pt c/o sharp stabbing pain under R breast, pt states she had similar pain last year when she has spontaneous pneumo after coughing. Pt denies any other s/s, pain worse with movement, deep breathing. Pt is on 2L  at all times

## 2017-09-20 NOTE — ED Notes (Signed)
Patient transported to CT 

## 2017-09-20 NOTE — Discharge Instructions (Signed)
You have been diagnosed by your caregiver as having chest wall pain. °SEEK IMMEDIATE MEDICAL ATTENTION IF: °You develop a fever.  °Your chest pains become severe or intolerable.  °You develop new, unexplained symptoms (problems).  °You develop shortness of breath, nausea, vomiting, sweating or feel light headed.  °You develop a new cough or you cough up blood. ° °

## 2017-09-24 ENCOUNTER — Encounter: Payer: Self-pay | Admitting: Family Medicine

## 2017-09-24 ENCOUNTER — Ambulatory Visit (INDEPENDENT_AMBULATORY_CARE_PROVIDER_SITE_OTHER): Payer: Medicare Other | Admitting: Family Medicine

## 2017-09-24 VITALS — BP 116/82 | HR 96 | Temp 98.3°F | Ht 65.0 in | Wt 203.4 lb

## 2017-09-24 DIAGNOSIS — Z13220 Encounter for screening for lipoid disorders: Secondary | ICD-10-CM

## 2017-09-24 DIAGNOSIS — D860 Sarcoidosis of lung: Secondary | ICD-10-CM | POA: Diagnosis not present

## 2017-09-24 DIAGNOSIS — J45909 Unspecified asthma, uncomplicated: Secondary | ICD-10-CM | POA: Diagnosis not present

## 2017-09-24 DIAGNOSIS — R109 Unspecified abdominal pain: Secondary | ICD-10-CM

## 2017-09-24 DIAGNOSIS — D869 Sarcoidosis, unspecified: Secondary | ICD-10-CM | POA: Diagnosis not present

## 2017-09-24 MED ORDER — OXYCODONE-ACETAMINOPHEN 5-325 MG PO TABS: 1.0000 | ORAL_TABLET | ORAL | 0 refills | Status: DC | PRN

## 2017-09-24 NOTE — Patient Instructions (Addendum)
It was good to see you today- please keep me posted regarding the pain in your side I will be in touch with your cholesterol results   Health Maintenance for Postmenopausal Women Menopause is a normal process in which your reproductive ability comes to an end. This process happens gradually over a span of months to years, usually between the ages of 91 and 58. Menopause is complete when you have missed 12 consecutive menstrual periods. It is important to talk with your health care provider about some of the most common conditions that affect postmenopausal women, such as heart disease, cancer, and bone loss (osteoporosis). Adopting a healthy lifestyle and getting preventive care can help to promote your health and wellness. Those actions can also lower your chances of developing some of these common conditions. What should I know about menopause? During menopause, you may experience a number of symptoms, such as:  Moderate-to-severe hot flashes.  Night sweats.  Decrease in sex drive.  Mood swings.  Headaches.  Tiredness.  Irritability.  Memory problems.  Insomnia.  Choosing to treat or not to treat menopausal changes is an individual decision that you make with your health care provider. What should I know about hormone replacement therapy and supplements? Hormone therapy products are effective for treating symptoms that are associated with menopause, such as hot flashes and night sweats. Hormone replacement carries certain risks, especially as you become older. If you are thinking about using estrogen or estrogen with progestin treatments, discuss the benefits and risks with your health care provider. What should I know about heart disease and stroke? Heart disease, heart attack, and stroke become more likely as you age. This may be due, in part, to the hormonal changes that your body experiences during menopause. These can affect how your body processes dietary fats, triglycerides,  and cholesterol. Heart attack and stroke are both medical emergencies. There are many things that you can do to help prevent heart disease and stroke:  Have your blood pressure checked at least every 1-2 years. High blood pressure causes heart disease and increases the risk of stroke.  If you are 12-37 years old, ask your health care provider if you should take aspirin to prevent a heart attack or a stroke.  Do not use any tobacco products, including cigarettes, chewing tobacco, or electronic cigarettes. If you need help quitting, ask your health care provider.  It is important to eat a healthy diet and maintain a healthy weight. ? Be sure to include plenty of vegetables, fruits, low-fat dairy products, and lean protein. ? Avoid eating foods that are high in solid fats, added sugars, or salt (sodium).  Get regular exercise. This is one of the most important things that you can do for your health. ? Try to exercise for at least 150 minutes each week. The type of exercise that you do should increase your heart rate and make you sweat. This is known as moderate-intensity exercise. ? Try to do strengthening exercises at least twice each week. Do these in addition to the moderate-intensity exercise.  Know your numbers.Ask your health care provider to check your cholesterol and your blood glucose. Continue to have your blood tested as directed by your health care provider.  What should I know about cancer screening? There are several types of cancer. Take the following steps to reduce your risk and to catch any cancer development as early as possible. Breast Cancer  Practice breast self-awareness. ? This means understanding how your breasts normally appear  and feel. ? It also means doing regular breast self-exams. Let your health care provider know about any changes, no matter how small.  If you are 63 or older, have a clinician do a breast exam (clinical breast exam or CBE) every year.  Depending on your age, family history, and medical history, it may be recommended that you also have a yearly breast X-ray (mammogram).  If you have a family history of breast cancer, talk with your health care provider about genetic screening.  If you are at high risk for breast cancer, talk with your health care provider about having an MRI and a mammogram every year.  Breast cancer (BRCA) gene test is recommended for women who have family members with BRCA-related cancers. Results of the assessment will determine the need for genetic counseling and BRCA1 and for BRCA2 testing. BRCA-related cancers include these types: ? Breast. This occurs in males or females. ? Ovarian. ? Tubal. This may also be called fallopian tube cancer. ? Cancer of the abdominal or pelvic lining (peritoneal cancer). ? Prostate. ? Pancreatic.  Cervical, Uterine, and Ovarian Cancer Your health care provider may recommend that you be screened regularly for cancer of the pelvic organs. These include your ovaries, uterus, and vagina. This screening involves a pelvic exam, which includes checking for microscopic changes to the surface of your cervix (Pap test).  For women ages 21-65, health care providers may recommend a pelvic exam and a Pap test every three years. For women ages 32-65, they may recommend the Pap test and pelvic exam, combined with testing for human papilloma virus (HPV), every five years. Some types of HPV increase your risk of cervical cancer. Testing for HPV may also be done on women of any age who have unclear Pap test results.  Other health care providers may not recommend any screening for nonpregnant women who are considered low risk for pelvic cancer and have no symptoms. Ask your health care provider if a screening pelvic exam is right for you.  If you have had past treatment for cervical cancer or a condition that could lead to cancer, you need Pap tests and screening for cancer for at least 20  years after your treatment. If Pap tests have been discontinued for you, your risk factors (such as having a new sexual partner) need to be reassessed to determine if you should start having screenings again. Some women have medical problems that increase the chance of getting cervical cancer. In these cases, your health care provider may recommend that you have screening and Pap tests more often.  If you have a family history of uterine cancer or ovarian cancer, talk with your health care provider about genetic screening.  If you have vaginal bleeding after reaching menopause, tell your health care provider.  There are currently no reliable tests available to screen for ovarian cancer.  Lung Cancer Lung cancer screening is recommended for adults 53-30 years old who are at high risk for lung cancer because of a history of smoking. A yearly low-dose CT scan of the lungs is recommended if you:  Currently smoke.  Have a history of at least 30 pack-years of smoking and you currently smoke or have quit within the past 15 years. A pack-year is smoking an average of one pack of cigarettes per day for one year.  Yearly screening should:  Continue until it has been 15 years since you quit.  Stop if you develop a health problem that would prevent you from  having lung cancer treatment.  Colorectal Cancer  This type of cancer can be detected and can often be prevented.  Routine colorectal cancer screening usually begins at age 56 and continues through age 64.  If you have risk factors for colon cancer, your health care provider may recommend that you be screened at an earlier age.  If you have a family history of colorectal cancer, talk with your health care provider about genetic screening.  Your health care provider may also recommend using home test kits to check for hidden blood in your stool.  A small camera at the end of a tube can be used to examine your colon directly (sigmoidoscopy or  colonoscopy). This is done to check for the earliest forms of colorectal cancer.  Direct examination of the colon should be repeated every 5-10 years until age 19. However, if early forms of precancerous polyps or small growths are found or if you have a family history or genetic risk for colorectal cancer, you may need to be screened more often.  Skin Cancer  Check your skin from head to toe regularly.  Monitor any moles. Be sure to tell your health care provider: ? About any new moles or changes in moles, especially if there is a change in a mole's shape or color. ? If you have a mole that is larger than the size of a pencil eraser.  If any of your family members has a history of skin cancer, especially at a young age, talk with your health care provider about genetic screening.  Always use sunscreen. Apply sunscreen liberally and repeatedly throughout the day.  Whenever you are outside, protect yourself by wearing long sleeves, pants, a wide-brimmed hat, and sunglasses.  What should I know about osteoporosis? Osteoporosis is a condition in which bone destruction happens more quickly than new bone creation. After menopause, you may be at an increased risk for osteoporosis. To help prevent osteoporosis or the bone fractures that can happen because of osteoporosis, the following is recommended:  If you are 40-51 years old, get at least 1,000 mg of calcium and at least 600 mg of vitamin D per day.  If you are older than age 43 but younger than age 44, get at least 1,200 mg of calcium and at least 600 mg of vitamin D per day.  If you are older than age 11, get at least 1,200 mg of calcium and at least 800 mg of vitamin D per day.  Smoking and excessive alcohol intake increase the risk of osteoporosis. Eat foods that are rich in calcium and vitamin D, and do weight-bearing exercises several times each week as directed by your health care provider. What should I know about how menopause  affects my mental health? Depression may occur at any age, but it is more common as you become older. Common symptoms of depression include:  Low or sad mood.  Changes in sleep patterns.  Changes in appetite or eating patterns.  Feeling an overall lack of motivation or enjoyment of activities that you previously enjoyed.  Frequent crying spells.  Talk with your health care provider if you think that you are experiencing depression. What should I know about immunizations? It is important that you get and maintain your immunizations. These include:  Tetanus, diphtheria, and pertussis (Tdap) booster vaccine.  Influenza every year before the flu season begins.  Pneumonia vaccine.  Shingles vaccine.  Your health care provider may also recommend other immunizations. This information is not  intended to replace advice given to you by your health care provider. Make sure you discuss any questions you have with your health care provider. Document Released: 10/17/2005 Document Revised: 03/14/2016 Document Reviewed: 05/29/2015 [employer] Interactive Patient Education  2018 [employer].

## 2017-09-24 NOTE — Progress Notes (Addendum)
[facility] [address] [place], [place] [address] [phone]  Date:  09/24/2017   Name:  [name]   DOB:  [birth year]   MRN:  [id number]  PCP:  Pearline Cables, MD    Chief Complaint: Annual Exam (Pt here for CPE. )   History of Present Illness:  [name] is a 67 y.o. very pleasant female patient who presents with the following:  Here today for a CPE Pulmonary sarcoidosis on chronic oxygen at this time  She was in the ER in 1/13 with possible shingles on her trunk- she is taking valtrex. She did not get any rash and feels uncertain about this dx She is taking oxycodone for her pain right now and it is helping.  She feels like the pain is maybe easing off over the last 2 days.  She has noted this pain for about 8 days total now  She feels like her energy level is good  mammo is coming due this summer She oatmeal and an banana 6 hours ago- we will check her lipids today She feels like she is doing well on the wellbutrin- she is feeling "upbeat, I don't have any depressing thoughts"  We started this back in November No falls   BP Readings from Last 3 Encounters:  09/24/17 116/82  09/20/17 116/72  07/17/17 128/82   She had a good Christmas.  She spent time with her 69 yo twin grand-daughters in [place].   When her husband [name] they would like to maybe move up to [facility] to be closer to her son and his family   Pulse Readings from Last 3 Encounters:  09/24/17 (!) 117  09/20/17 93  07/17/17 93   Dg Chest 2 View  Result Date: 09/20/2017 CLINICAL DATA:  Right-sided chest pain.  History of sarcoidosis. EXAM: CHEST  2 VIEW COMPARISON:  07/10/2017 FINDINGS: The cardiac silhouette, mediastinal and hilar contours are stable. Stable mediastinal and hilar adenopathy. Stable severe chronic lung disease with sarcoidosis related pulmonary fibrosis. No acute overlying pulmonary findings. The bony thorax  is intact. IMPRESSION: Severe chronic lung disease but no acute overlying pulmonary findings. Electronically Signed   By: Rudie Meyer M.D.   On: 09/20/2017 21:31   Ct Angio Chest Pe W And/or Wo Contrast  Result Date: 09/20/2017 CLINICAL DATA:  Sarcoidosis.  Right chest pain. EXAM: CT ANGIOGRAPHY CHEST WITH CONTRAST TECHNIQUE: Multidetector CT imaging of the chest was performed using the standard protocol during bolus administration of intravenous contrast. Multiplanar CT image reconstructions and MIPs were obtained to evaluate the vascular anatomy. CONTRAST:  ISOVUE-370 IOPAMIDOL (ISOVUE-370) INJECTION 76% COMPARISON:  Chest radiograph from earlier today. 06/05/2017 chest CT angiogram. FINDINGS: Cardiovascular: The study is high quality for the evaluation of pulmonary embolism. There are no filling defects in the central, lobar, segmental or subsegmental pulmonary artery branches to suggest acute pulmonary embolism. Normal course and caliber of the thoracic aorta. Stable dilated main pulmonary artery (3.5 cm diameter). Stable top-normal heart size. No significant pericardial fluid/thickening. Mediastinum/Nodes: No discrete thyroid nodules. Stable postsurgical changes from Nissen fundoplication. No acute abnormality in the esophagus. No axillary adenopathy. Stable right paratracheal adenopathy measuring up to 1.9 cm (series 6/image 43). Stable enlarged 2.2 cm subcarinal node (series 6/image 66). Stable enlarged 1.5 cm AP window node (series 6/image 35). Stable bilateral hilar adenopathy measuring up to 1.9 cm on the right (series 6/image 63) and 1.6 cm  on the left (series 6/image 47). Stable lower left para-aortic mediastinal adenopathy measuring up to 1.6 cm (series 6/image 84). Lungs/Pleura: No pneumothorax. No pleural effusion. No acute consolidative airspace disease, lung masses or significant pulmonary nodules. There is extensive patchy confluent peribronchovascular, perifissural and subpleural  reticulation, architectural distortion and honeycombing throughout both lungs involving all lung lobes without a clear basilar gradient. No appreciable interval change. Upper abdomen: No acute abnormality. Musculoskeletal: No aggressive appearing focal osseous lesions. Soft tissue anchors noted in the right humeral head. Healed deformity is noted in right lateral sixth and seventh ribs. Review of the MIP images confirms the above findings. IMPRESSION: 1. No pulmonary embolism. 2. Stable bulky mediastinal and bilateral hilar adenopathy compatible with sarcoidosis. 3. Stable advanced fibrotic interstitial lung disease with honeycombing, compatible with pulmonary sarcoidosis. No acute pulmonary disease. 4. Stable dilated main pulmonary artery, compatible with chronic pulmonary arterial hypertension. Electronically Signed   By: Delbert PhenixJason A Poff M.D.   On: 09/20/2017 23:04      Patient Active Problem List   Diagnosis Date Noted  . Other chest pain   . Palliative care by specialist   . Spontaneous pneumothorax 04/18/2017  . SOB (shortness of breath) 06/05/2016  . Osteopenia 02/12/2016  . Lumbar radiculopathy 01/26/2015  . Chronic respiratory failure with hypoxia (HCC) 04/12/2014  . Obesity (BMI 30-39.9) 02/08/2014  . Allergic rhinitis 09/05/2010  . IBS 09/05/2010  . GERD 02/18/2010  . Barrett's esophagus 02/12/2010  . Upper airway cough syndrome 11/02/2009  . Anxiety state 08/29/2008  . History of colonic polyps 08/29/2008  . PULMONARY SARCOIDOSIS 08/12/2007  . Mild persistent asthma in adult without complication with component of vcd 08/12/2007  . Diaphragmatic hernia 08/12/2007  . Migraine 08/12/2007    Past Medical History:  Diagnosis Date  . Anemia   . Anxiety   . Asthma   . Barrett esophagus   . Depression   . GERD (gastroesophageal reflux disease)   . Hiatal hernia   . IBS (irritable bowel syndrome)   . Migraines   . Pneumothorax   . Pulmonary sarcoidosis (HCC)   . Supplemental  oxygen dependent   . Wears dentures     Past Surgical History:  Procedure Laterality Date  . APPENDECTOMY    . BRAVO PH STUDY N/A 10/10/2013   Procedure: BRAVO PH STUDY;  Surgeon: Hart Carwinora M Brodie, MD;  Location: [facility];  Service: Endoscopy;  Laterality: N/A;  placed 27  . BRONCHIAL BIOPSY  1996  . CATARACT EXTRACTION, BILATERAL    . CHEST TUBE INSERTION Right 04/23/2017   Procedure: CHEST TUBE INSERTION;  Surgeon: Kerin PernaVan Trigt, Peter, MD;  [facility];  Service: Thoracic;  Laterality: Right;  . CHOLECYSTECTOMY    . ESOPHAGOGASTRODUODENOSCOPY N/A 10/10/2013   Procedure: ESOPHAGOGASTRODUODENOSCOPY (EGD);  Surgeon: Hart Carwinora M Brodie, MD;  [facility];  Service: Endoscopy;  Laterality: N/A;  pt asthmatic, RA 92% at rest.  Coughing frequently. Wheezes noted in upper lung fields at auscultation. Pt instructed to use home inhaler prior to procedure and placed on supportive O2 @ 2 lpm in pre-procedural. Teaching done ie. use of inhaler. Pt states she ju  . NISSEN FUNDOPLICATION    . ROTATOR CUFF REPAIR     x3(2 on L, 1 on R)  . VIDEO BRONCHOSCOPY WITH INSERTION OF INTERBRONCHIAL VALVE (IBV) N/A 05/08/2017   Procedure: VIDEO BRONCHOSCOPY WITH INSERTION OF INTERBRONCHIAL VALVE (IBV);  Surgeon: Loreli SlotHendrickson, Steven C, MD;  [facility];  Service: Thoracic;  Laterality: N/A;  .  VIDEO BRONCHOSCOPY WITH INSERTION OF INTERBRONCHIAL VALVE (IBV) N/A 07/10/2017   Procedure: VIDEO BRONCHOSCOPY FOR REMOVAL OF ENDOSCOPIC BRONCHIAL VALVE;  Surgeon: [name], MD;  Location: [facility] OR;  Service: Thoracic;  Laterality: N/A;    Social History   Tobacco Use  . Smoking status: Former Smoker    Packs/day: 1.00    Years: 20.00    Pack years: 20.00    Types: Cigarettes    Last attempt to quit: 09/08/1978    Years since quitting: 39.0  . Smokeless tobacco: Never Used  Substance Use Topics  . Alcohol use: No  . Drug use: No    Family History  Adopted: Yes    Allergies  Allergen Reactions  .  Gabapentin Swelling    EDEMA WITH POSITIVE RECHALLENGE  . Lactose Intolerance (Gi) Diarrhea and Other (See Comments)    Stomach cramps  . Pineapple Hives and Itching  . Penicillins Other (See Comments)    Unknown - childhood allergy Has patient had a PCN reaction causing immediate rash, facial/tongue/throat swelling, SOB or lightheadedness with hypotension: Unknown Has patient had a PCN reaction causing severe rash involving mucus membranes or skin necrosis: Unknown Has patient had a PCN reaction that required hospitalization: Unknown Has patient had a PCN reaction occurring within the last 10 years: No If all of the above answers are "NO", then may proceed with Cephalosporin use.  . Aspirin Other (See Comments)    gastritis    Medication list has been reviewed and updated.  Current Outpatient Medications on File Prior to Visit  Medication Sig Dispense Refill  . albuterol (VENTOLIN HFA) 108 (90 Base) MCG/ACT inhaler Inhale 2 puffs into the lungs as needed. (Patient taking differently: Inhale 2 puffs into the lungs every 4 (four) hours as needed for wheezing or shortness of breath. ) 1 Inhaler 2  . ALOE VERA JUICE PO Take 120 mLs by mouth daily.     . Ascorbic Acid (VITAMIN C PO) Take 1 tablet by mouth daily. 1000 MG    . ASTRAGALUS PO Take 1 tablet by mouth daily.     . budesonide-formoterol (SYMBICORT) 160-4.5 MCG/ACT inhaler Inhale 2 puffs 2 (two) times daily into the lungs. 1 Inhaler 11  . buPROPion (WELLBUTRIN XL) 300 MG 24 hr tablet Take 1 tablet (300 mg total) daily by mouth. 30 tablet 5  . Calcium Carbonate-Vitamin D (CALCIUM-D PO) Take 1 tablet by mouth daily.    . Collagen Hydrolysate POWD Take 1 scoop by mouth daily with supper.     . Cyanocobalamin (VITAMIN B-12) 5000 MCG TBDP Take 5,000 mcg by mouth daily.    . cycloSPORINE (RESTASIS) 0.05 % ophthalmic emulsion Place 1 drop into both eyes 2 (two) times daily.     . fluticasone (FLONASE) 50 MCG/ACT nasal spray Place 1 spray  into both nostrils 2 (two) times daily.     . furosemide (LASIX) 20 MG tablet TAKE 1 TABLET (20 MG TOTAL) BY MOUTH DAILY. USE AS NEEDED FOR SWELLING (Patient taking differently: Take 20 mg by mouth daily as needed (swelling). ) 90 tablet 1  . guaiFENesin (MUCINEX) 600 MG 12 hr tablet Take 1 tablet by mouth daily.     Marland Kitchen ipratropium (ATROVENT HFA) 17 MCG/ACT inhaler Inhale 2 puffs into the lungs every 6 (six) hours as needed for wheezing. 1 Inhaler 1  . loratadine (CLARITIN) 10 MG tablet Take 10 mg by mouth daily.    Marland Kitchen LORazepam (ATIVAN) 0.5 MG tablet TAKE 1/2 TO 1 TABLET  BY MOUTH EVERY 8 TO 12 HRS ONLY AS NEEDED (Patient taking differently: TAKE 1/2 TO 1 TABLET (0.25 - 0.5 MG) BY MOUTH EVERY 8 TO 12 HRS ONLY AS NEEDED FOR PANIC ATTACKS) 30 tablet 0  . Multiple Vitamin (MULTIVITAMIN WITH MINERALS) TABS tablet Take 1 tablet by mouth daily.    . Nasal Moisturizer Combination (RHINASE) SOLN Place 2 sprays into the nose 2 (two) times daily as needed (dry, bloody nostrils).     . Omega 3-6-9 Fatty Acids (OMEGA 3-6-9 COMPLEX PO) Take 1 capsule by mouth daily.    . OXYGEN Inhale 2-4 L into the lungs continuous. If moving around it is increased    . pantoprazole (PROTONIX) 20 MG tablet TAKE 1 TABLET (20 MG TOTAL) BY MOUTH DAILY. (Patient taking differently: TAKE 1 TABLET (20 MG TOTAL) BY MOUTH DAILY BEFORE BREAKFAST) 90 tablet 3  . phenylephrine (SUDAFED PE) 10 MG TABS tablet Take 10 mg by mouth daily.    . polyvinyl alcohol (ARTIFICIAL TEARS) 1.4 % ophthalmic solution Place 1 drop into both eyes See admin instructions. Instill 1 drop in to both eyes once or twice daily as needed for dry eyes - in between restasis doses    . PREBIOTIC PRODUCT PO Take 1 scoop by mouth daily before breakfast.     . ranitidine (ZANTAC) 150 MG tablet TAKE 1 TABLET (150 MG TOTAL) BY MOUTH 2 (TWO) TIMES DAILY. 120 tablet 2  . valACYclovir (VALTREX) 1000 MG tablet Take 1 tablet (1,000 mg total) by mouth 3 (three) times daily. 21  tablet 0   No current facility-administered medications on file prior to visit.     Review of Systems:  As per HPI- otherwise negative.   Physical Examination: Vitals:   09/24/17 1657  BP: 116/82  Pulse: (!) 117  Temp: 98.3 F (36.8 C)  SpO2: 97%   Vitals:   09/24/17 1657  Weight: 203 lb 6.4 oz (92.3 kg)  Height: 5\' 5"  (1.651 m)   Body mass index is 33.85 kg/m. Ideal Body Weight: Weight in (lb) to have BMI = 25: 149.9  GEN: WDWN, NAD, Non-toxic, A & O x 3 HEENT: Atraumatic, Normocephalic. Neck supple. No masses, No LAD.  Bilateral TM wnl, oropharynx normal.  PEERL,EOMI.   Ears and Nose: No external deformity. CV: RRR, No M/G/R. No JVD. No thrill. No extra heart sounds. PULM: CTA B, no wheezes, crackles, rhonchi. No retractions. No resp. distress. No accessory muscle use. ABD: S, NT, ND. No rebound. No HSM. EXTR: No c/c/e.  Feet are in good condition NEURO Normal gait.  PSYCH: Normally interactive. Conversant. Not depressed or anxious appearing.  Calm demeanor.  Wearing her oxygen tank as per her usual Looks well-  She shows me the are on her right side that is painful- at approx the T6 level.  She does have a couple of small, healing wounds due to her recent chest tube in this area.  They do not appear to be infected.  No shingles rash.  She is tender to palpation of the chest wall in this distribution Repeat pulse- once she had rested from walking to clinic- in the 90s  Assessment and Plan: Screening for hyperlipidemia - Plan: Lipid panel  Right sided abdominal pain - Plan: oxyCODONE-acetaminophen (PERCOCET) 5-325 MG tablet  Pulmonary sarcoidosis (HCC)  Here today for a physical and recheck visit She needs a lipid panel, order for her today She got 10 pills of percocet for presumed shingles from the ER.  These  are helping her and she would like a few more- will rx for her today. Same sig as from ER but she knows to take as little as she is able to control her pain   The exact cause of this pain is not certain- zoster vs MSK chest wall pain.  She did have a negative CT of her chest when she presented to the ER She will let me know if not continuing to improve   Signed Abbe Amsterdam, MD  Received her lipid panel Results for orders placed or performed in visit on 09/24/17  Lipid panel  Result Value Ref Range   Cholesterol 187 0 - 200 mg/dL   Triglycerides 161.0 0.0 - 149.0 mg/dL   HDL 96.04 >54.09 mg/dL   VLDL 81.1 0.0 - 91.4 mg/dL   LDL Cholesterol 93 0 - 99 mg/dL   Total CHOL/HDL Ratio 3    NonHDL 118.75    Looks good- message to pt

## 2017-09-25 LAB — LIPID PANEL
CHOL/HDL RATIO: 3
CHOLESTEROL: 187 mg/dL (ref 0–200)
HDL: 67.9 mg/dL (ref >39.00)
LDL CALC: 93 mg/dL (ref 0–99)
NONHDL: 118.75
Triglycerides: 129 mg/dL (ref 0.0–149.0)
VLDL: 25.8 mg/dL (ref 0.0–40.0)

## 2017-09-26 ENCOUNTER — Encounter: Payer: Self-pay | Admitting: Family Medicine

## 2017-10-10 ENCOUNTER — Other Ambulatory Visit: Payer: Self-pay | Admitting: Family Medicine

## 2017-10-19 ENCOUNTER — Ambulatory Visit: Payer: Self-pay | Admitting: Pulmonary Disease

## 2017-10-25 DIAGNOSIS — D869 Sarcoidosis, unspecified: Secondary | ICD-10-CM | POA: Diagnosis not present

## 2017-10-25 DIAGNOSIS — J45909 Unspecified asthma, uncomplicated: Secondary | ICD-10-CM | POA: Diagnosis not present

## 2017-11-02 ENCOUNTER — Ambulatory Visit: Payer: Self-pay | Admitting: Pulmonary Disease

## 2017-11-22 DIAGNOSIS — J45909 Unspecified asthma, uncomplicated: Secondary | ICD-10-CM | POA: Diagnosis not present

## 2017-11-22 DIAGNOSIS — D869 Sarcoidosis, unspecified: Secondary | ICD-10-CM | POA: Diagnosis not present

## 2017-11-23 ENCOUNTER — Ambulatory Visit: Payer: Self-pay | Admitting: Pulmonary Disease

## 2017-11-23 NOTE — Progress Notes (Deleted)
Subjective:   PATIENT ID: [name] GENDER: female DOB: 1951/01/20, MRN: [id number]  Synopsis: Referred in *** for ***   HPI  No chief complaint on file.   ***  Past Medical History:  Diagnosis Date  . Anemia   . Anxiety   . Asthma   . Barrett esophagus   . Depression   . GERD (gastroesophageal reflux disease)   . Hiatal hernia   . IBS (irritable bowel syndrome)   . Migraines   . Pneumothorax   . Pulmonary sarcoidosis (HCC)   . Supplemental oxygen dependent   . Wears dentures      Family History  Adopted: Yes     Social History   Socioeconomic History  . Marital status: Married    Spouse name: Not on file  . Number of children: Not on file  . Years of education: Not on file  . Highest education level: Not on file  Social Needs  . Financial resource strain: Not on file  . Food insecurity - worry: Not on file  . Food insecurity - inability: Not on file  . Transportation needs - medical: Not on file  . Transportation needs - non-medical: Not on file  Occupational History  . Occupation: Semi RetiredWater engineer- Manager at [employer]  Tobacco Use  . Smoking status: Former Smoker    Packs/day: 1.00    Years: 20.00    Pack years: 20.00    Types: Cigarettes    Last attempt to quit: 09/08/1978    Years since quitting: 39.2  . Smokeless tobacco: Never Used  Substance and Sexual Activity  . Alcohol use: No  . Drug use: No  . Sexual activity: Yes  Other Topics Concern  . Not on file  Social History Narrative  . Not on file     Allergies  Allergen Reactions  . Gabapentin Swelling    EDEMA WITH POSITIVE RECHALLENGE  . Lactose Intolerance (Gi) Diarrhea and Other (See Comments)    Stomach cramps  . Pineapple Hives and Itching  . Penicillins Other (See Comments)    Unknown - childhood allergy Has patient had a PCN reaction causing immediate rash, facial/tongue/throat swelling, SOB or lightheadedness with hypotension: Unknown Has patient had a PCN  reaction causing severe rash involving mucus membranes or skin necrosis: Unknown Has patient had a PCN reaction that required hospitalization: Unknown Has patient had a PCN reaction occurring within the last 10 years: No If all of the above answers are "NO", then may proceed with Cephalosporin use.  . Aspirin Other (See Comments)    gastritis     Outpatient Medications Prior to Visit  Medication Sig Dispense Refill  . albuterol (VENTOLIN HFA) 108 (90 Base) MCG/ACT inhaler Inhale 2 puffs into the lungs as needed. (Patient taking differently: Inhale 2 puffs into the lungs every 4 (four) hours as needed for wheezing or shortness of breath. ) 1 Inhaler 2  . ALOE VERA JUICE PO Take 120 mLs by mouth daily.     . Ascorbic Acid (VITAMIN C PO) Take 1 tablet by mouth daily. 1000 MG    . ASTRAGALUS PO Take 1 tablet by mouth daily.     . budesonide-formoterol (SYMBICORT) 160-4.5 MCG/ACT inhaler Inhale 2 puffs 2 (two) times daily into the lungs. 1 Inhaler 11  . buPROPion (WELLBUTRIN XL) 300 MG 24 hr tablet Take 1 tablet (300 mg total) daily by mouth. 30 tablet 5  . Calcium Carbonate-Vitamin D (CALCIUM-D PO) Take 1 tablet by  mouth daily.    . Collagen Hydrolysate POWD Take 1 scoop by mouth daily with supper.     . Cyanocobalamin (VITAMIN B-12) 5000 MCG TBDP Take 5,000 mcg by mouth daily.    . cycloSPORINE (RESTASIS) 0.05 % ophthalmic emulsion Place 1 drop into both eyes 2 (two) times daily.     . fluticasone (FLONASE) 50 MCG/ACT nasal spray Place 1 spray into both nostrils 2 (two) times daily.     . furosemide (LASIX) 20 MG tablet TAKE 1 TABLET (20 MG TOTAL) BY MOUTH DAILY. USE AS NEEDED FOR SWELLING (Patient taking differently: Take 20 mg by mouth daily as needed (swelling). ) 90 tablet 1  . guaiFENesin (MUCINEX) 600 MG 12 hr tablet Take 1 tablet by mouth daily.     Marland Kitchen ipratropium (ATROVENT HFA) 17 MCG/ACT inhaler Inhale 2 puffs into the lungs every 6 (six) hours as needed for wheezing. 1 Inhaler 1  .  loratadine (CLARITIN) 10 MG tablet Take 10 mg by mouth daily.    Marland Kitchen LORazepam (ATIVAN) 0.5 MG tablet TAKE 1/2 TO 1 TABLET BY MOUTH EVERY 8 TO 12 HRS ONLY AS NEEDED (Patient taking differently: TAKE 1/2 TO 1 TABLET (0.25 - 0.5 MG) BY MOUTH EVERY 8 TO 12 HRS ONLY AS NEEDED FOR PANIC ATTACKS) 30 tablet 0  . Multiple Vitamin (MULTIVITAMIN WITH MINERALS) TABS tablet Take 1 tablet by mouth daily.    . Nasal Moisturizer Combination (RHINASE) SOLN Place 2 sprays into the nose 2 (two) times daily as needed (dry, bloody nostrils).     . Omega 3-6-9 Fatty Acids (OMEGA 3-6-9 COMPLEX PO) Take 1 capsule by mouth daily.    Marland Kitchen oxyCODONE-acetaminophen (PERCOCET) 5-325 MG tablet Take 1-2 tablets by mouth every 4 (four) hours as needed. 10 tablet 0  . OXYGEN Inhale 2-4 L into the lungs continuous. If moving around it is increased    . pantoprazole (PROTONIX) 20 MG tablet TAKE 1 TABLET (20 MG TOTAL) BY MOUTH DAILY. (Patient taking differently: TAKE 1 TABLET (20 MG TOTAL) BY MOUTH DAILY BEFORE BREAKFAST) 90 tablet 3  . phenylephrine (SUDAFED PE) 10 MG TABS tablet Take 10 mg by mouth daily.    . polyvinyl alcohol (ARTIFICIAL TEARS) 1.4 % ophthalmic solution Place 1 drop into both eyes See admin instructions. Instill 1 drop in to both eyes once or twice daily as needed for dry eyes - in between restasis doses    . PREBIOTIC PRODUCT PO Take 1 scoop by mouth daily before breakfast.     . ranitidine (ZANTAC) 150 MG tablet TAKE 1 TABLET (150 MG TOTAL) BY MOUTH 2 (TWO) TIMES DAILY. 120 tablet 2  . valACYclovir (VALTREX) 1000 MG tablet Take 1 tablet (1,000 mg total) by mouth 3 (three) times daily. 21 tablet 0   No facility-administered medications prior to visit.     ROS    Objective:  Physical Exam   There were no vitals filed for this visit.  ***  CBC    Component Value Date/Time   WBC 8.3 09/20/2017 2048   RBC 4.28 09/20/2017 2048   HGB 12.9 09/20/2017 2048   HCT 40.4 09/20/2017 2048   PLT 310 09/20/2017  2048   MCV 94.4 09/20/2017 2048   MCH 30.1 09/20/2017 2048   MCHC 31.9 09/20/2017 2048   RDW 13.0 09/20/2017 2048   LYMPHSABS 1.6 05/05/2017 2330   MONOABS 1.6 (H) 05/05/2017 2330   EOSABS 0.5 05/05/2017 2330   BASOSABS 0.2 (H) 05/05/2017 2330    PFT  Pfts 08/22/2013 nl lung vols, dlco 53 corrects to 73% - PFTs 05/16/2014 Nl lung vols, DLCO 42% corrects to 67%   PFT 08/2017  shows FEV1 88%, ratio 87, FVC 78%, ++BD repsonse , DLCO 35% (down from 42%)   Chest imaging: CT angio 05/2016 >>Diffuse mediastinal and bilateral hilar lymphadenopathy, Diffuse honeycombing and scarring    Echo:  2-D echo on December 7 showed an EF of 60-65%, grade 1 diastolic dysfunction, pulmonary artery pressure 30 [email or web address] dilated aortic root   Other: UGI 10/2013 > Trace reflux       Assessment & Plan:   No diagnosis found.  Discussion: ***    Current Outpatient Medications:  .  albuterol (VENTOLIN HFA) 108 (90 Base) MCG/ACT inhaler, Inhale 2 puffs into the lungs as needed. (Patient taking differently: Inhale 2 puffs into the lungs every 4 (four) hours as needed for wheezing or shortness of breath. ), Disp: 1 Inhaler, Rfl: 2 .  ALOE VERA JUICE PO, Take 120 mLs by mouth daily. , Disp: , Rfl:  .  Ascorbic Acid (VITAMIN C PO), Take 1 tablet by mouth daily. 1000 MG, Disp: , Rfl:  .  ASTRAGALUS PO, Take 1 tablet by mouth daily. , Disp: , Rfl:  .  budesonide-formoterol (SYMBICORT) 160-4.5 MCG/ACT inhaler, Inhale 2 puffs 2 (two) times daily into the lungs., Disp: 1 Inhaler, Rfl: 11 .  buPROPion (WELLBUTRIN XL) 300 MG 24 hr tablet, Take 1 tablet (300 mg total) daily by mouth., Disp: 30 tablet, Rfl: 5 .  Calcium Carbonate-Vitamin D (CALCIUM-D PO), Take 1 tablet by mouth daily., Disp: , Rfl:  .  Collagen Hydrolysate POWD, Take 1 scoop by mouth daily with supper. , Disp: , Rfl:  .  Cyanocobalamin (VITAMIN B-12) 5000 MCG TBDP, Take 5,000 mcg by mouth daily., Disp: , Rfl:  .  cycloSPORINE  (RESTASIS) 0.05 % ophthalmic emulsion, Place 1 drop into both eyes 2 (two) times daily. , Disp: , Rfl:  .  fluticasone (FLONASE) 50 MCG/ACT nasal spray, Place 1 spray into both nostrils 2 (two) times daily. , Disp: , Rfl:  .  furosemide (LASIX) 20 MG tablet, TAKE 1 TABLET (20 MG TOTAL) BY MOUTH DAILY. USE AS NEEDED FOR SWELLING (Patient taking differently: Take 20 mg by mouth daily as needed (swelling). ), Disp: 90 tablet, Rfl: 1 .  guaiFENesin (MUCINEX) 600 MG 12 hr tablet, Take 1 tablet by mouth daily. , Disp: , Rfl:  .  ipratropium (ATROVENT HFA) 17 MCG/ACT inhaler, Inhale 2 puffs into the lungs every 6 (six) hours as needed for wheezing., Disp: 1 Inhaler, Rfl: 1 .  loratadine (CLARITIN) 10 MG tablet, Take 10 mg by mouth daily., Disp: , Rfl:  .  LORazepam (ATIVAN) 0.5 MG tablet, TAKE 1/2 TO 1 TABLET BY MOUTH EVERY 8 TO 12 HRS ONLY AS NEEDED (Patient taking differently: TAKE 1/2 TO 1 TABLET (0.25 - 0.5 MG) BY MOUTH EVERY 8 TO 12 HRS ONLY AS NEEDED FOR PANIC ATTACKS), Disp: 30 tablet, Rfl: 0 .  Multiple Vitamin (MULTIVITAMIN WITH MINERALS) TABS tablet, Take 1 tablet by mouth daily., Disp: , Rfl:  .  Nasal Moisturizer Combination (RHINASE) SOLN, Place 2 sprays into the nose 2 (two) times daily as needed (dry, bloody nostrils). , Disp: , Rfl:  .  Omega 3-6-9 Fatty Acids (OMEGA 3-6-9 COMPLEX PO), Take 1 capsule by mouth daily., Disp: , Rfl:  .  oxyCODONE-acetaminophen (PERCOCET) 5-325 MG tablet, Take 1-2 tablets by mouth every 4 (four) hours as needed., Disp: 10  tablet, Rfl: 0 .  OXYGEN, Inhale 2-4 L into the lungs continuous. If moving around it is increased, Disp: , Rfl:  .  pantoprazole (PROTONIX) 20 MG tablet, TAKE 1 TABLET (20 MG TOTAL) BY MOUTH DAILY. (Patient taking differently: TAKE 1 TABLET (20 MG TOTAL) BY MOUTH DAILY BEFORE BREAKFAST), Disp: 90 tablet, Rfl: 3 .  phenylephrine (SUDAFED PE) 10 MG TABS tablet, Take 10 mg by mouth daily., Disp: , Rfl:  .  polyvinyl alcohol (ARTIFICIAL TEARS) 1.4 %  ophthalmic solution, Place 1 drop into both eyes See admin instructions. Instill 1 drop in to both eyes once or twice daily as needed for dry eyes - in between restasis doses, Disp: , Rfl:  .  PREBIOTIC PRODUCT PO, Take 1 scoop by mouth daily before breakfast. , Disp: , Rfl:  .  ranitidine (ZANTAC) 150 MG tablet, TAKE 1 TABLET (150 MG TOTAL) BY MOUTH 2 (TWO) TIMES DAILY., Disp: 120 tablet, Rfl: 2 .  valACYclovir (VALTREX) 1000 MG tablet, Take 1 tablet (1,000 mg total) by mouth 3 (three) times daily., Disp: 21 tablet, Rfl: 0

## 2017-12-16 ENCOUNTER — Other Ambulatory Visit: Payer: Self-pay

## 2017-12-16 DIAGNOSIS — D860 Sarcoidosis of lung: Secondary | ICD-10-CM

## 2017-12-16 MED ORDER — ALBUTEROL SULFATE HFA 108 (90 BASE) MCG/ACT IN AERS: 2.0000 | INHALATION_SPRAY | RESPIRATORY_TRACT | 3 refills | Status: DC | PRN

## 2017-12-23 DIAGNOSIS — D869 Sarcoidosis, unspecified: Secondary | ICD-10-CM | POA: Diagnosis not present

## 2017-12-23 DIAGNOSIS — J45909 Unspecified asthma, uncomplicated: Secondary | ICD-10-CM | POA: Diagnosis not present

## 2018-01-21 IMAGING — CT CT ANGIO CHEST
3 of 7 series · 18 of 36 positions shown · IV contrast (isovue)
Comparison: Radiographs today.  Chest CT 04/19/2017.

CLINICAL DATA: Shortness of breath with hypoxemia and palpitations
today. Evaluate for pulmonary embolism.

EXAM:
CT ANGIOGRAPHY CHEST WITH CONTRAST
TECHNIQUE: Multidetector CT imaging of the chest was performed using the
standard protocol during bolus administration of intravenous
contrast. Multiplanar CT image reconstructions and MIPs were
obtained to evaluate the vascular anatomy.
CONTRAST:  70 ml Isovue 370.

[Series 6: lung · axial · 0.73mm/px · z∈[+1131,+1235]mm · 3 of 131 slices shown]
[im 27/131  mediastinal]
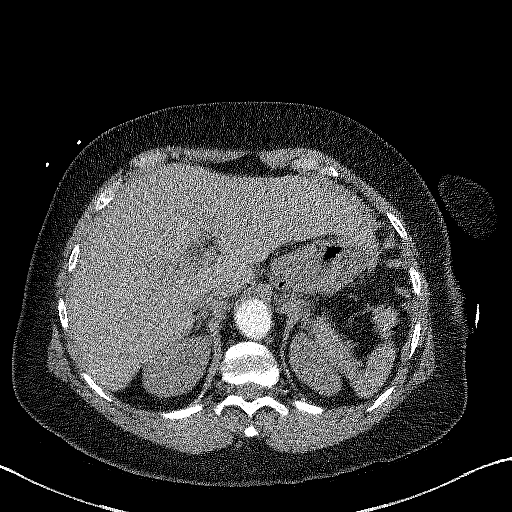
[im 53/131  mediastinal]
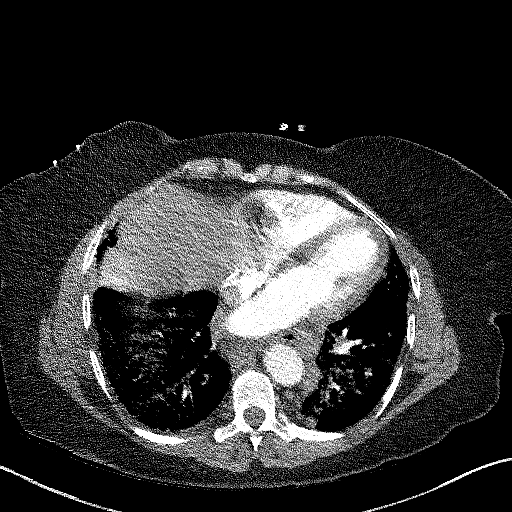
[im 79/131  mediastinal]
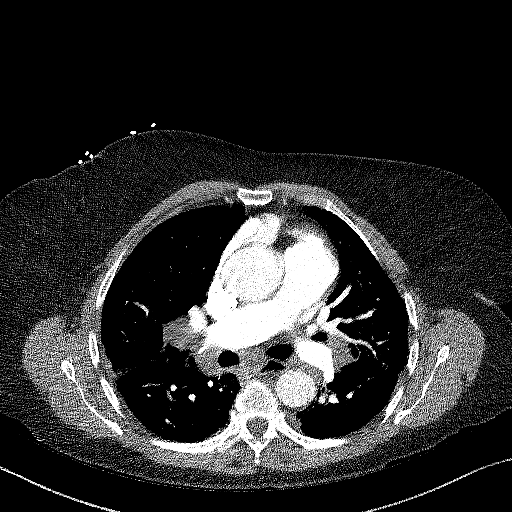

[Series 7: thins · axial · 0.73mm/px · z∈[+1096,+1321]mm · 14 of 372 slices shown]
[im 25/372  lung]
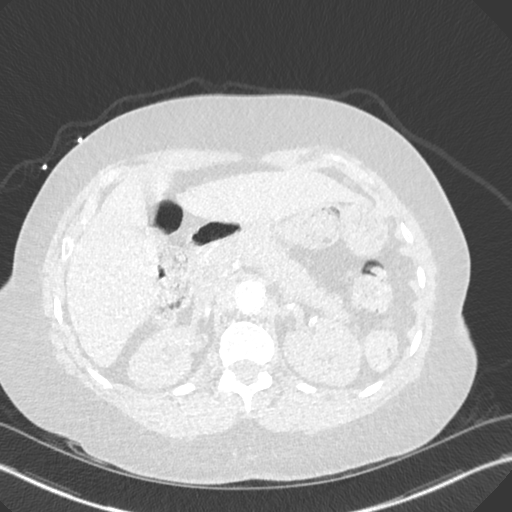
[im 50/372  mediastinal]
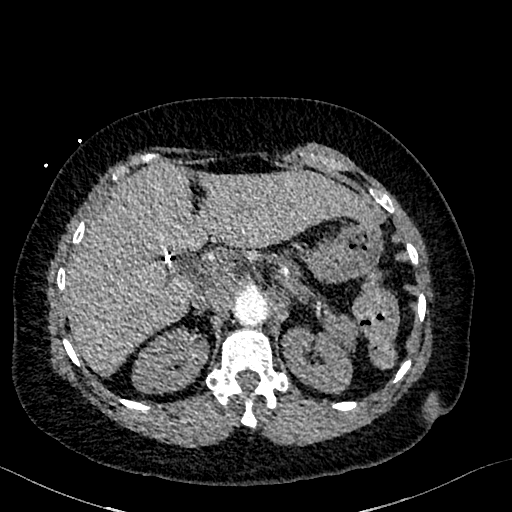
[im 75/372  lung]
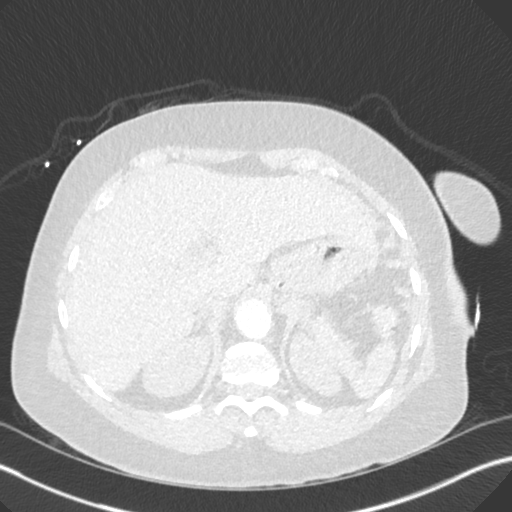
[im 99/372  mediastinal]
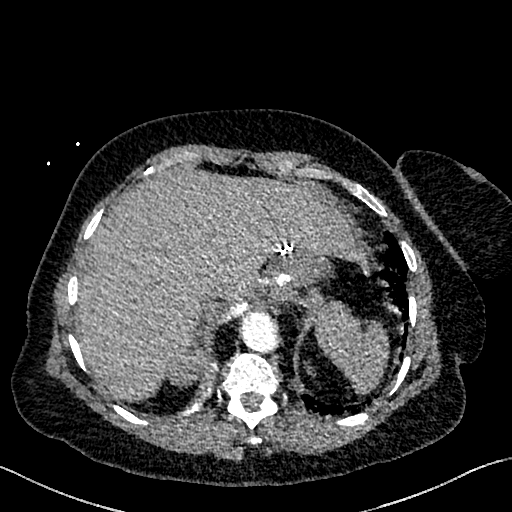
[im 124/372  lung]
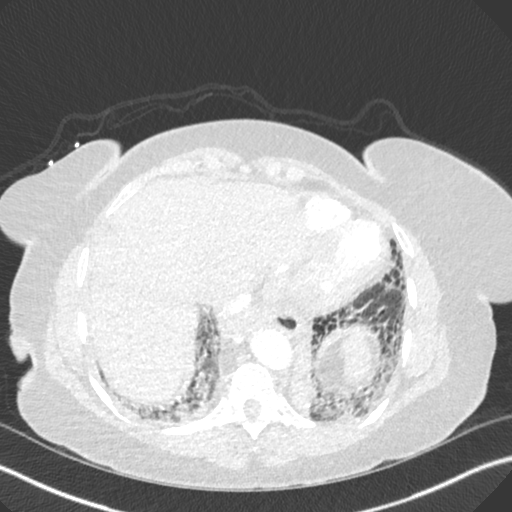
[im 149/372  mediastinal]
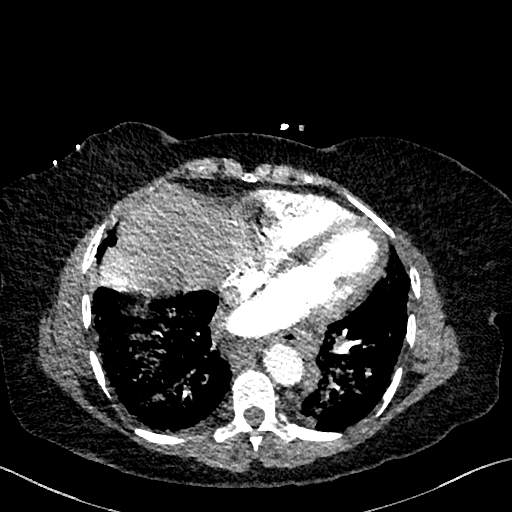
[im 174/372  lung]
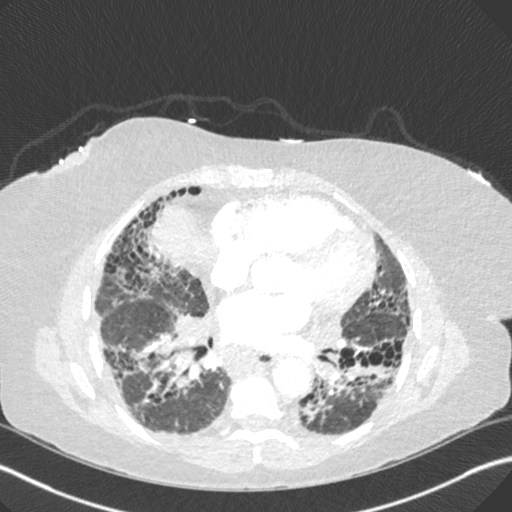
[im 198/372  mediastinal]
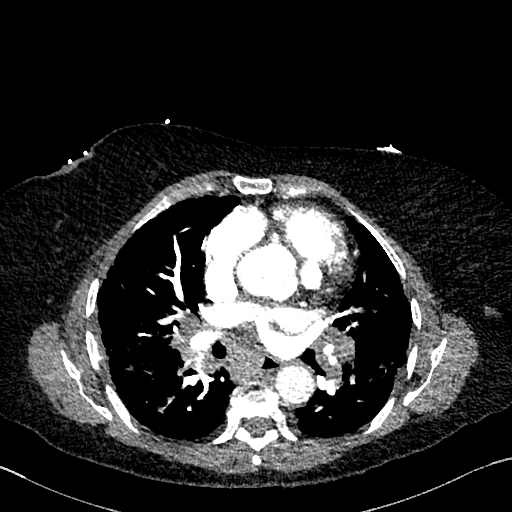
[im 223/372  lung]
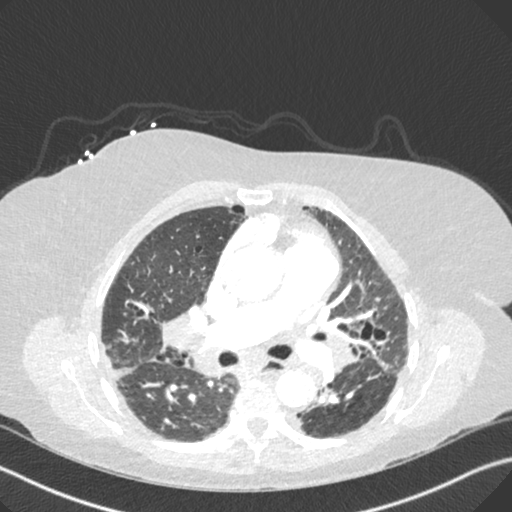
[im 248/372  mediastinal]
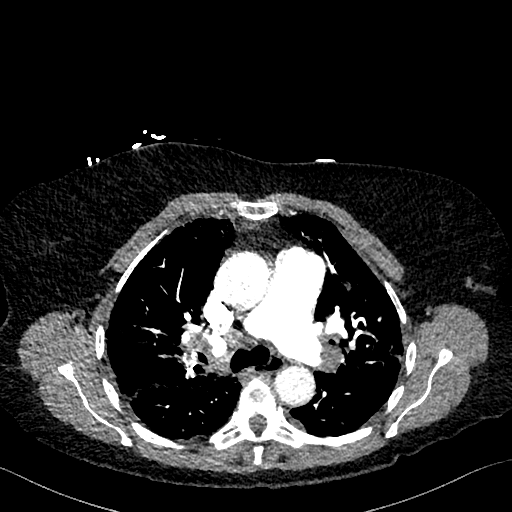
[im 273/372  lung]
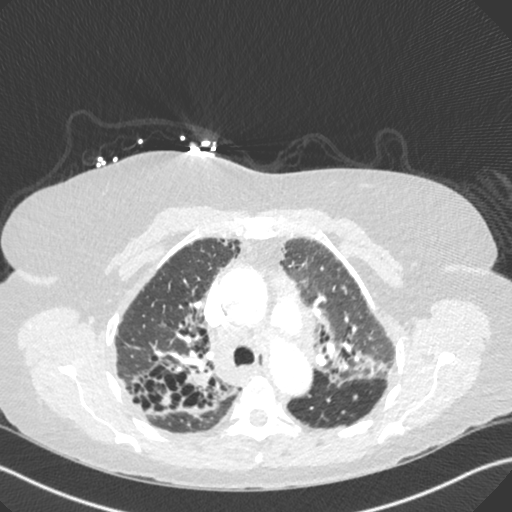
[im 297/372  mediastinal]
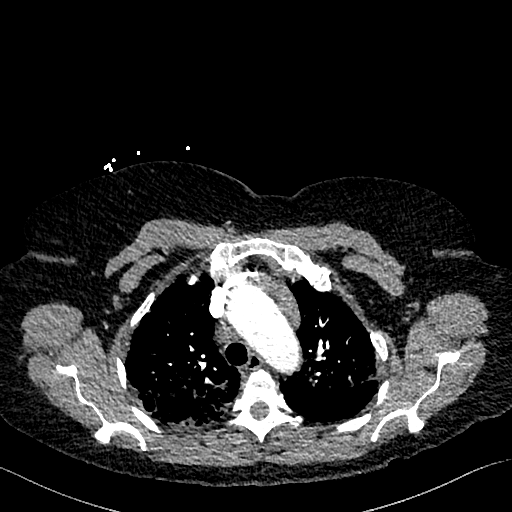
[im 322/372  lung]
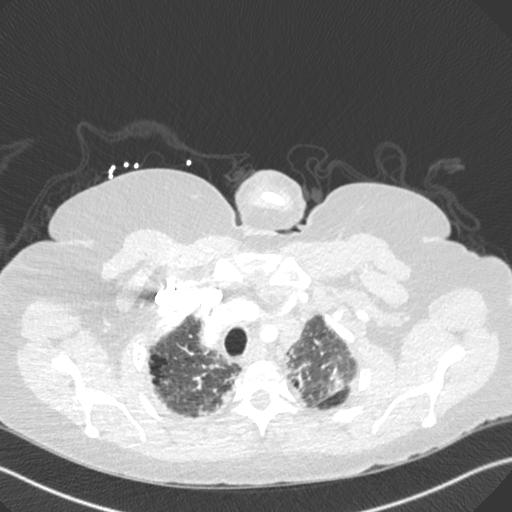
[im 347/372  mediastinal]
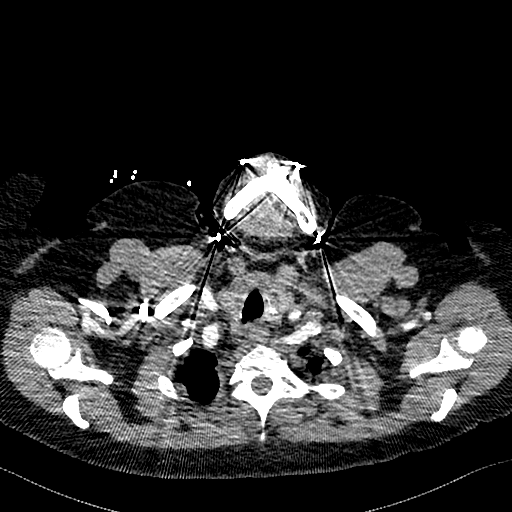

[Series 8: cor · coronal · 0.52mm/px · 1 of 140 slices shown]
[im 70/140  mediastinal]
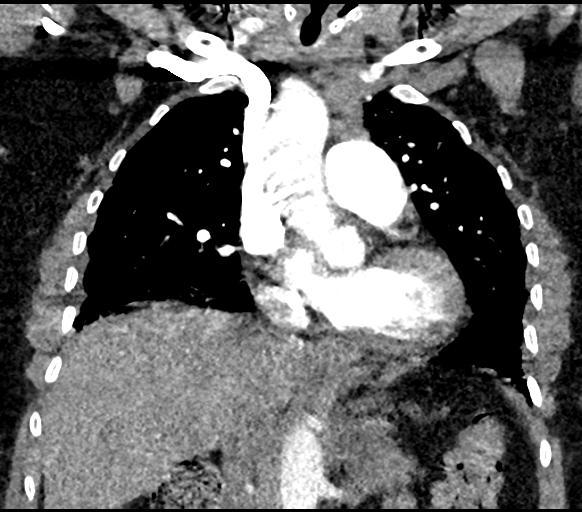

[18 of 36 positions shown; findings below may reference images not displayed]

FINDINGS: Cardiovascular: The pulmonary arteries are well opacified with
contrast to the level of the subsegmental branches. There is no
evidence of acute pulmonary embolism. There is central enlargement
of the pulmonary arteries consistent with pulmonary arterial
hypertension. The thoracic aorta and great vessels appear
unremarkable. The heart size is normal. There is no pericardial
effusion.

Mediastinum/Nodes: Multiple enlarged mediastinal and hilar lymph
nodes are again noted, including a 15 mm AP window node on image 33
of series 5 and 824 mm subcarinal node on image 66. There is no
axillary lymphadenopathy. The thyroid gland, trachea and esophagus
demonstrate no significant findings.

Lungs/Pleura: There is no pleural effusion. Right chest tube has
been removed. There is no recurrent pneumothorax or soft tissue
emphysema. Right upper lobe intrabronchial valve is noted.
Multifocal bronchiectasis, honeycomb formation and subpleural
reticulation is again noted. No superimposed airspace disease
identified.

Upper abdomen: Stable postsurgical changes in the upper abdomen. No
acute findings.

Musculoskeletal/Chest wall: There is no chest wall mass or
suspicious osseous finding.

Review of the MIP images confirms the above findings.
IMPRESSION: 1. No evidence of acute pulmonary embolism.
2. No evidence of recurrent pneumothorax following right upper lobe
intrabronchial valve placement.
3. Stable chronic lung disease attributed to chronic sarcoidosis.
Associated mediastinal and hilar adenopathy.
4. Central enlargement of the pulmonary arteries consistent with
pulmonary arterial hypertension.

## 2018-01-22 DIAGNOSIS — J45909 Unspecified asthma, uncomplicated: Secondary | ICD-10-CM | POA: Diagnosis not present

## 2018-01-22 DIAGNOSIS — D869 Sarcoidosis, unspecified: Secondary | ICD-10-CM | POA: Diagnosis not present

## 2018-02-22 DIAGNOSIS — D869 Sarcoidosis, unspecified: Secondary | ICD-10-CM | POA: Diagnosis not present

## 2018-02-22 DIAGNOSIS — J45909 Unspecified asthma, uncomplicated: Secondary | ICD-10-CM | POA: Diagnosis not present

## 2018-03-24 DIAGNOSIS — J45909 Unspecified asthma, uncomplicated: Secondary | ICD-10-CM | POA: Diagnosis not present

## 2018-03-24 DIAGNOSIS — D869 Sarcoidosis, unspecified: Secondary | ICD-10-CM | POA: Diagnosis not present

## 2018-04-07 ENCOUNTER — Telehealth: Payer: Self-pay

## 2018-04-07 NOTE — Telephone Encounter (Signed)
Copied from CRM #[id number]. [phone]: Quick Communication - Office Called Patient >> Apr 06, 2018  3:42 PM Oneal GroutSebastian, Jennifer S wrote: Reason for CRM: returning call to office

## 2018-04-07 NOTE — Telephone Encounter (Signed)
No documentation where patient was called anytime recently.

## 2018-04-24 DIAGNOSIS — J45909 Unspecified asthma, uncomplicated: Secondary | ICD-10-CM | POA: Diagnosis not present

## 2018-04-24 DIAGNOSIS — D869 Sarcoidosis, unspecified: Secondary | ICD-10-CM | POA: Diagnosis not present

## 2018-05-09 ENCOUNTER — Other Ambulatory Visit: Payer: Self-pay | Admitting: Gastroenterology

## 2018-05-14 ENCOUNTER — Telehealth: Payer: Self-pay | Admitting: Pulmonary Disease

## 2018-05-14 NOTE — Telephone Encounter (Signed)
Received call from [name], who is requesting OV with RA in [place]. I have made [name] aware that RA no longer sees [name]'s at the [facility] office.  I have offered apt with RA in [place] apt. [name] declined, as she prefers to not travel to [place]. [name] is requesting to see either BQ or VS in [place] office.  [name] is aware of our office protocol and that RA is out of the office until 05/24/18. [name] is okay with waiting for response once RA is back.  RA please advise. Thanks.

## 2018-05-15 NOTE — Telephone Encounter (Signed)
Ok to switch 

## 2018-05-17 NOTE — Telephone Encounter (Signed)
Patient returned call, she was scheduled for sleep consult in [place] office with Dr. Craige Cotta. Appt already sch with BQ was cancelled, as VS will be her physician in [place] now.  Patient is aware of this.  She states she has been having problems sleeping and wants appointment for this, so 30 min OV was changed to SLEEP CONSULT.  May close message, no call back is needed.

## 2018-05-17 NOTE — Telephone Encounter (Signed)
Called patient, unable to reach left message to give us a call back. 

## 2018-05-17 NOTE — Telephone Encounter (Signed)
Noted. Will route to Dr. Craige Cotta to advised if ok to take on pt in [place].   Dr. Craige Cotta please advise. Thanks.

## 2018-05-17 NOTE — Telephone Encounter (Signed)
I am okay for switch.

## 2018-05-25 DIAGNOSIS — J45909 Unspecified asthma, uncomplicated: Secondary | ICD-10-CM | POA: Diagnosis not present

## 2018-05-25 DIAGNOSIS — D869 Sarcoidosis, unspecified: Secondary | ICD-10-CM | POA: Diagnosis not present

## 2018-05-31 ENCOUNTER — Ambulatory Visit: Payer: Self-pay | Admitting: Pulmonary Disease

## 2018-06-16 ENCOUNTER — Ambulatory Visit (INDEPENDENT_AMBULATORY_CARE_PROVIDER_SITE_OTHER): Payer: Medicare Other | Admitting: Pulmonary Disease

## 2018-06-16 ENCOUNTER — Encounter: Payer: Self-pay | Admitting: Pulmonary Disease

## 2018-06-16 VITALS — BP 118/88 | HR 110 | Ht 63.0 in | Wt 208.0 lb

## 2018-06-16 DIAGNOSIS — Z23 Encounter for immunization: Secondary | ICD-10-CM

## 2018-06-16 DIAGNOSIS — J9620 Acute and chronic respiratory failure, unspecified whether with hypoxia or hypercapnia: Secondary | ICD-10-CM | POA: Diagnosis not present

## 2018-06-16 DIAGNOSIS — J9611 Chronic respiratory failure with hypoxia: Secondary | ICD-10-CM

## 2018-06-16 DIAGNOSIS — J4530 Mild persistent asthma, uncomplicated: Secondary | ICD-10-CM | POA: Diagnosis not present

## 2018-06-16 DIAGNOSIS — D869 Sarcoidosis, unspecified: Secondary | ICD-10-CM

## 2018-06-16 DIAGNOSIS — K219 Gastro-esophageal reflux disease without esophagitis: Secondary | ICD-10-CM | POA: Diagnosis not present

## 2018-06-16 DIAGNOSIS — J45909 Unspecified asthma, uncomplicated: Secondary | ICD-10-CM | POA: Diagnosis not present

## 2018-06-16 DIAGNOSIS — R05 Cough: Secondary | ICD-10-CM | POA: Diagnosis not present

## 2018-06-16 MED ORDER — TIOTROPIUM BROMIDE MONOHYDRATE 2.5 MCG/ACT IN AERS: 2.0000 | INHALATION_SPRAY | Freq: Every day | RESPIRATORY_TRACT | 5 refills | Status: DC

## 2018-06-16 MED ORDER — FLUTTER DEVI: 1.0000 "application " | Freq: Two times a day (BID) | 0 refills | Status: AC

## 2018-06-16 NOTE — Progress Notes (Signed)
   Subjective:    Patient ID: RABECCA BIRGE, female    DOB: 12/19/1950, 68 y.o.   MRN: [id number]  HPI    Review of Systems  Constitutional: Negative for fever and unexpected weight change.  HENT: Positive for postnasal drip. Negative for congestion, dental problem, ear pain, nosebleeds, rhinorrhea, sinus pressure, sneezing, sore throat and trouble swallowing.   Eyes: Negative for redness and itching.  Respiratory: Positive for cough, chest tightness, shortness of breath and wheezing.   Cardiovascular: Positive for leg swelling. Negative for palpitations.  Gastrointestinal: Negative for nausea and vomiting.  Genitourinary: Negative for dysuria.  Musculoskeletal: Negative for joint swelling.  Skin: Negative for rash.  Allergic/Immunologic: Positive for environmental allergies. Negative for food allergies and immunocompromised state.  Neurological: Negative for headaches.  Hematological: Does not bruise/bleed easily.  Psychiatric/Behavioral: Negative for dysphoric mood. The patient is not nervous/anxious.        Objective:   Physical Exam        Assessment & Plan:

## 2018-06-16 NOTE — Progress Notes (Signed)
[facility]  Chief Complaint  Patient presents with  . sleep consult    Pt falls asleep around 1am, sleeps an hour, up 3 hrs and then up few more hours. Pt has daytime sleepiness.    Constitutional:  BP 118/88 (BP Location: Left Arm, Cuff Size: Normal)   Pulse (!) 110   Ht 5\' 3"  (1.6 m)   Wt 208 lb (94.3 kg)   SpO2 96%   BMI 36.85 kg/m   Past Medical History:  Anemia, Anxiety, Barrett's Esophagus, Depression, HH, IBS, PTX  Brief Summary:  [name] is a 67 y.o. female former smoker with sarcoidosis, asthma, and chronic respiratory failure.  She was seen by Dr. Sherene Sires and then Dr. Vassie Loll.  She wants to continue therapy in [place].  She was having trouble with her sleep before.  She would have trouble falling asleep and staying asleep.  She started using CBD oil, and feels this has helped.  She doesn't think she snores or has sleep apnea.  Her husband uses CPAP and she wouldn't want to use this.  She uses 1.5 to 3 liters oxygen, depending on her activity.  She has persistent cough with clear sputum.  She is not having wheeze, chest pain, fever, or hemoptysis.  Denies leg swelling.  She has hx of reflux.  She gets episodes of nausea if she doesn't eat on time.   Physical Exam:   Appearance - well kempt, wearing oxygen ENMT - clear nasal mucosa, midline nasal septum, no oral exudates, no LAN, trachea midline Respiratory - normal chest wall, normal respiratory effort, basilar rhonchi, no wheeze CV - s1s2 regular rate and rhythm, no murmurs, no peripheral edema, radial pulses symmetric GI - soft, non tender, no masses Lymph - no adenopathy noted in neck and axillary areas MSK - normal muscle strength and tone, normal gait Ext - no cyanosis, clubbing, or joint inflammation noted Skin - no rashes, lesions, or ulcers Neuro - oriented to person, place, and time Psych - normal mood and affect  Assessment/Plan:   Disrupted sleep. -  improved with CBD oil; she can continue this - no need for additional sleep testing at this time  Pulmonary sarcoidosis. - she has persistent cough - mucinex, flutter valve  Persistent asthma. - continue symbicort, prn albuterol - add spiriva  Chronic respiratory failure with hypoxia. - 1.5 to 3 liters oxygen 24/7 depending on her activity level  Allergic rhinitis. - claritin prn  Reflux. - advised her to f/u with GI - she might be interested in switching to [facility] in [place]    Patient Instructions  Spiriva respimat two puffs daily Flutter valve twice per day when you ar have more cough and chest congestion High dose flu shot today  Follow up in 6 months    Coralyn Helling, MD [facility] Pager: [phone] 06/16/2018, 3:24 PM  Flow Sheet    Pulmonary tests:  PFT 08/14/16 >> 1.60 (88%), FEV1% 87, TLC 3.34 (68%), DLCO 35%, +BD CT chest 09/20/17 >> subpleural reticulation, distortion, honeycombing b/l  Cardiac tests:  Echo 08/14/16 >> EF 60 to 65%, grade 1 DD, PAS 30 mmHg   Review of Systems:  Constitutional: Negative for fever and unexpected weight change.  HENT: Positive for postnasal drip. Negative for congestion, dental problem, ear pain, nosebleeds, rhinorrhea, sinus pressure, sneezing, sore throat and trouble swallowing.   Eyes: Negative for redness and itching.  Respiratory: Positive for cough, chest tightness, shortness of breath  and wheezing.   Cardiovascular: Positive for leg swelling. Negative for palpitations.  Gastrointestinal: Negative for nausea and vomiting.  Genitourinary: Negative for dysuria.  Musculoskeletal: Negative for joint swelling.  Skin: Negative for rash.  Allergic/Immunologic: Positive for environmental allergies. Negative for food allergies and immunocompromised state.  Neurological: Negative for headaches.  Hematological: Does not bruise/bleed easily.  Psychiatric/Behavioral: Negative for dysphoric mood. The  patient is not nervous/anxious.    Medications:   Allergies as of 06/16/2018      Reactions   Gabapentin Swelling   EDEMA WITH POSITIVE RECHALLENGE   Lactose Intolerance (gi) Diarrhea, Other (See Comments)   Stomach cramps   Pineapple Hives, Itching   Penicillins Other (See Comments)   Unknown - childhood allergy Has patient had a PCN reaction causing immediate rash, facial/tongue/throat swelling, SOB or lightheadedness with hypotension: Unknown Has patient had a PCN reaction causing severe rash involving mucus membranes or skin necrosis: Unknown Has patient had a PCN reaction that required hospitalization: Unknown Has patient had a PCN reaction occurring within the last 10 years: No If all of the above answers are "NO", then may proceed with Cephalosporin use.   Aspirin Other (See Comments)   gastritis      Medication List        Accurate as of 06/16/18  3:24 PM. Always use your most recent med list.          albuterol 108 (90 Base) MCG/ACT inhaler Commonly known as:  PROVENTIL HFA;VENTOLIN HFA Inhale 2 puffs into the lungs every 4 (four) hours as needed for wheezing or shortness of breath.   ALOE VERA JUICE PO Take 120 mLs by mouth daily.   ARTIFICIAL TEARS 1.4 % ophthalmic solution Generic drug:  polyvinyl alcohol Place 1 drop into both eyes See admin instructions. Instill 1 drop in to both eyes once or twice daily as needed for dry eyes - in between restasis doses   ASTRAGALUS PO Take 1 tablet by mouth daily.   budesonide-formoterol 160-4.5 MCG/ACT inhaler Commonly known as:  SYMBICORT Inhale 2 puffs 2 (two) times daily into the lungs.   buPROPion 300 MG 24 hr tablet Commonly known as:  WELLBUTRIN XL Take 1 tablet (300 mg total) daily by mouth.   CALCIUM-D PO Take 1 tablet by mouth daily.   Collagen Hydrolysate Powd Take 1 scoop by mouth daily with supper.   cycloSPORINE 0.05 % ophthalmic emulsion Commonly known as:  RESTASIS Place 1 drop into both eyes 2  (two) times daily.   furosemide 20 MG tablet Commonly known as:  LASIX TAKE 1 TABLET (20 MG TOTAL) BY MOUTH DAILY. USE AS NEEDED FOR SWELLING   ipratropium 17 MCG/ACT inhaler Commonly known as:  ATROVENT HFA Inhale 2 puffs into the lungs every 6 (six) hours as needed for wheezing.   loratadine 10 MG tablet Commonly known as:  CLARITIN Take 10 mg by mouth daily.   MUCINEX 600 MG 12 hr tablet Generic drug:  guaiFENesin Take 1 tablet by mouth daily.   multivitamin with minerals Tabs tablet Take 1 tablet by mouth daily.   OMEGA 3-6-9 COMPLEX PO Take 1 capsule by mouth daily.   OXYGEN Inhale 2-4 L into the lungs continuous. If moving around it is increased   pantoprazole 20 MG tablet Commonly known as:  PROTONIX TAKE 1 TABLET (20 MG TOTAL) BY MOUTH DAILY.   phenylephrine 10 MG Tabs tablet Commonly known as:  SUDAFED PE Take 10 mg by mouth daily.   PREBIOTIC PRODUCT PO  Take 1 scoop by mouth daily before breakfast.   ranitidine 150 MG tablet Commonly known as:  ZANTAC TAKE 1 TABLET (150 MG TOTAL) BY MOUTH 2 (TWO) TIMES DAILY.   RHINASE Soln Place 2 sprays into the nose 2 (two) times daily as needed (dry, bloody nostrils).   Tiotropium Bromide Monohydrate 2.5 MCG/ACT Aers Inhale 2 puffs into the lungs daily.   valACYclovir 1000 MG tablet Commonly known as:  VALTREX Take 1 tablet (1,000 mg total) by mouth 3 (three) times daily.   Vitamin B-12 5000 MCG Tbdp Take 5,000 mcg by mouth daily.   VITAMIN C PO Take 1 tablet by mouth daily. 1000 MG       Past Surgical History:  She  has a past surgical history that includes Cholecystectomy; Rotator cuff repair; Cataract extraction, bilateral; Bronchial biopsy (1996); Nissen fundoplication; BRAVO ph study (N/A, 10/10/2013); Esophagogastroduodenoscopy (N/A, 10/10/2013); Chest tube insertion (Right, 04/23/2017); Video bronchoscopy with insertion of interbronchial valve (ibv) (N/A, 05/08/2017); Appendectomy; and Video bronchoscopy  with insertion of interbronchial valve (ibv) (N/A, 07/10/2017).  Family History:  Her family history is not on file. She was adopted.  Social History:  She  reports that she quit smoking about 39 years ago. Her smoking use included cigarettes. She has a 20.00 pack-year smoking history. She has never used smokeless tobacco. She reports that she does not drink alcohol or use drugs.

## 2018-06-16 NOTE — Patient Instructions (Addendum)
Spiriva respimat two puffs daily Flutter valve twice per day when you ar have more cough and chest congestion High dose flu shot today  Follow up in 6 months

## 2018-06-16 NOTE — Addendum Note (Signed)
Addended by: Cornell Barman on: 06/16/2018 03:29 PM   Modules accepted: Orders

## 2018-06-17 DIAGNOSIS — Z23 Encounter for immunization: Secondary | ICD-10-CM | POA: Diagnosis not present

## 2018-06-18 ENCOUNTER — Telehealth: Payer: Self-pay | Admitting: Pulmonary Disease

## 2018-06-18 MED ORDER — BUDESONIDE-FORMOTEROL FUMARATE 160-4.5 MCG/ACT IN AERO: 2.0000 | INHALATION_SPRAY | Freq: Two times a day (BID) | RESPIRATORY_TRACT | 11 refills | Status: DC

## 2018-06-18 NOTE — Telephone Encounter (Signed)
Called and spoke with patient.  She was wanting to make sure that she was suppose to continue with Symbicort, start the Spiriva.     Per Dr. Craige Cotta, 06/16/18 OV- Persistent asthma. - continue symbicort, prn albuterol - add spiriva Dr. Evlyn Courier Ov instructions given.  Patient stated understanding.  She stated that she needed a refill for Symbicort.  Refill sent to preferred pharmacy, [facility].  Nothing further at this time.

## 2018-06-24 DIAGNOSIS — J45909 Unspecified asthma, uncomplicated: Secondary | ICD-10-CM | POA: Diagnosis not present

## 2018-06-24 DIAGNOSIS — D869 Sarcoidosis, unspecified: Secondary | ICD-10-CM | POA: Diagnosis not present

## 2018-07-16 ENCOUNTER — Other Ambulatory Visit: Payer: Self-pay | Admitting: Gastroenterology

## 2018-07-16 NOTE — Telephone Encounter (Signed)
Patient needs an office visit. This was advised when we denied refill earlier today.

## 2018-07-23 ENCOUNTER — Other Ambulatory Visit: Payer: Self-pay

## 2018-07-23 ENCOUNTER — Telehealth: Payer: Self-pay | Admitting: Gastroenterology

## 2018-07-23 MED ORDER — PANTOPRAZOLE SODIUM 20 MG PO TBEC: DELAYED_RELEASE_TABLET | ORAL | 1 refills | Status: DC

## 2018-07-23 NOTE — Progress Notes (Signed)
rx sent

## 2018-07-25 DIAGNOSIS — J45909 Unspecified asthma, uncomplicated: Secondary | ICD-10-CM | POA: Diagnosis not present

## 2018-07-25 DIAGNOSIS — D869 Sarcoidosis, unspecified: Secondary | ICD-10-CM | POA: Diagnosis not present

## 2018-07-26 ENCOUNTER — Ambulatory Visit: Payer: Self-pay | Admitting: Gastroenterology

## 2018-07-29 DIAGNOSIS — H04123 Dry eye syndrome of bilateral lacrimal glands: Secondary | ICD-10-CM | POA: Diagnosis not present

## 2018-07-29 DIAGNOSIS — H16223 Keratoconjunctivitis sicca, not specified as Sjogren's, bilateral: Secondary | ICD-10-CM | POA: Diagnosis not present

## 2018-07-29 DIAGNOSIS — Z961 Presence of intraocular lens: Secondary | ICD-10-CM | POA: Diagnosis not present

## 2018-08-24 DIAGNOSIS — D869 Sarcoidosis, unspecified: Secondary | ICD-10-CM | POA: Diagnosis not present

## 2018-08-24 DIAGNOSIS — J45909 Unspecified asthma, uncomplicated: Secondary | ICD-10-CM | POA: Diagnosis not present

## 2018-08-24 NOTE — Telephone Encounter (Signed)
Appt for 07-26-18 cx due to pt being sick. Will c-b to reschedule later.

## 2018-09-24 DIAGNOSIS — J45909 Unspecified asthma, uncomplicated: Secondary | ICD-10-CM | POA: Diagnosis not present

## 2018-09-24 DIAGNOSIS — D869 Sarcoidosis, unspecified: Secondary | ICD-10-CM | POA: Diagnosis not present

## 2018-09-25 NOTE — Progress Notes (Addendum)
[facility] [address] [place], [place] [address] [phone]  Date:  09/27/2018   Name:  [name]   DOB:  03-02-1951   MRN:  [id number]  PCP:  Pearline Cables, MD    Chief Complaint: Annual Exam (needs to schedule mammogram)   History of Present Illness:  [name] is a 68 y.o. very pleasant female patient who presents with the following:  Here today for complete physical. She has history of pulmonary sarcoidosis/asthma, and chronic respiratory failure, lower back pain, and GERD Her pulmonologist is Dr. Craige Cotta, he saw her most recently in October She uses 1.5 to 3 L of oxygen, depending on her activity level. She may actually go up to 5 liters when using her portable oxygenator out of the home-she feels like this 1 does not work as well as the Architect she keeps at home. She is using oxygen 24/7 She is on Spiriva, and albuterol as needed  I saw her most recently about 1 year ago.  At that time her good mood was good on Wellbutrin She has twin 4-year-old granddaughters who live in [place], and one granddaughter who is graduated from college.  She plans to attend law school. She plans to eventually move to [facility] to be closer to family  Mammogram:?  2017- order for her  Colonoscopy: Negative Cologuard in 2017, due for repeat this year Immunizations: Flu and pneumonia are UTD, consider Shingrix DEXA scan: 2017, due for repeat Labs: Due for full labs today, she had a few bites of oatmeal this am  Pap:  We don't do any longer   She stopped taking wellbutrin- she is using CBD oils instead and feels like her mood is good right now  She is rarely using lasix  No falls, she is not feeling depressed    Patient Active Problem List   Diagnosis Date Noted  . Other chest pain   . Spontaneous pneumothorax 04/18/2017  . SOB (shortness of breath) 06/05/2016  . Osteopenia 02/12/2016  . Lumbar  radiculopathy 01/26/2015  . Chronic respiratory failure with hypoxia (HCC) 04/12/2014  . Obesity (BMI 30-39.9) 02/08/2014  . Allergic rhinitis 09/05/2010  . IBS 09/05/2010  . GERD 02/18/2010  . Barrett's esophagus 02/12/2010  . Upper airway cough syndrome 11/02/2009  . Anxiety state 08/29/2008  . History of colonic polyps 08/29/2008  . PULMONARY SARCOIDOSIS 08/12/2007  . Mild persistent asthma in adult without complication with component of vcd 08/12/2007  . Diaphragmatic hernia 08/12/2007  . Migraine 08/12/2007    Past Medical History:  Diagnosis Date  . Anemia   . Anxiety   . Asthma   . Barrett esophagus   . Depression   . GERD (gastroesophageal reflux disease)   . Hiatal hernia   . IBS (irritable bowel syndrome)   . Migraines   . Pneumothorax   . Pulmonary sarcoidosis (HCC)   . Supplemental oxygen dependent   . Wears dentures     Past Surgical History:  Procedure Laterality Date  . APPENDECTOMY    . BRAVO PH STUDY N/A 10/10/2013   Procedure: BRAVO PH STUDY;  Surgeon: Hart Carwin, MD;  Location: [facility];  Service: Endoscopy;  Laterality: N/A;  placed 27  . BRONCHIAL BIOPSY  1996  . CATARACT EXTRACTION, BILATERAL    . CHEST TUBE INSERTION Right 04/23/2017   Procedure: CHEST TUBE INSERTION;  Surgeon: [name], MD;  Location: [facility] OR;  Service: Thoracic;  Laterality: Right;  . CHOLECYSTECTOMY    . ESOPHAGOGASTRODUODENOSCOPY N/A 10/10/2013   Procedure: ESOPHAGOGASTRODUODENOSCOPY (EGD);  Surgeon: Hart Carwin, MD;  Location: [facility];  Service: Endoscopy;  Laterality: N/A;  pt asthmatic, RA 92% at rest.  Coughing frequently. Wheezes noted in upper lung fields at auscultation. Pt instructed to use home inhaler prior to procedure and placed on supportive O2 @ 2 lpm in pre-procedural. Teaching done ie. use of inhaler. Pt states she ju  . NISSEN FUNDOPLICATION    . ROTATOR CUFF REPAIR     x3(2 on L, 1 on R)  . VIDEO BRONCHOSCOPY WITH INSERTION OF INTERBRONCHIAL  VALVE (IBV) N/A 05/08/2017   Procedure: VIDEO BRONCHOSCOPY WITH INSERTION OF INTERBRONCHIAL VALVE (IBV);  Surgeon: Loreli Slot, MD;  Location: [facility] OR;  Service: Thoracic;  Laterality: N/A;  . VIDEO BRONCHOSCOPY WITH INSERTION OF INTERBRONCHIAL VALVE (IBV) N/A 07/10/2017   Procedure: VIDEO BRONCHOSCOPY FOR REMOVAL OF ENDOSCOPIC BRONCHIAL VALVE;  Surgeon: [name], MD;  Location: [facility] OR;  Service: Thoracic;  Laterality: N/A;    Social History   Tobacco Use  . Smoking status: Former Smoker    Packs/day: 1.00    Years: 20.00    Pack years: 20.00    Types: Cigarettes    Last attempt to quit: 09/08/1978    Years since quitting: 40.0  . Smokeless tobacco: Never Used  Substance Use Topics  . Alcohol use: No  . Drug use: No    Family History  Adopted: Yes    Allergies  Allergen Reactions  . Gabapentin Swelling    EDEMA WITH POSITIVE RECHALLENGE  . Lactose Intolerance (Gi) Diarrhea and Other (See Comments)    Stomach cramps  . Pineapple Hives and Itching  . Penicillins Other (See Comments)    Unknown - childhood allergy Has patient had a PCN reaction causing immediate rash, facial/tongue/throat swelling, SOB or lightheadedness with hypotension: Unknown Has patient had a PCN reaction causing severe rash involving mucus membranes or skin necrosis: Unknown Has patient had a PCN reaction that required hospitalization: Unknown Has patient had a PCN reaction occurring within the last 10 years: No If all of the above answers are "NO", then may proceed with Cephalosporin use.  . Aspirin Other (See Comments)    gastritis    Medication list has been reviewed and updated.  Current Outpatient Medications on File Prior to Visit  Medication Sig Dispense Refill  . albuterol (VENTOLIN HFA) 108 (90 Base) MCG/ACT inhaler Inhale 2 puffs into the lungs every 4 (four) hours as needed for wheezing or shortness of breath. 18 g 3  . ALOE VERA JUICE PO Take 120 mLs by mouth daily.     .  Ascorbic Acid (VITAMIN C PO) Take 1 tablet by mouth daily. 1000 MG    . ASTRAGALUS PO Take 1 tablet by mouth daily.     . budesonide-formoterol (SYMBICORT) 160-4.5 MCG/ACT inhaler Inhale 2 puffs into the lungs 2 (two) times daily. 1 Inhaler 11  . buPROPion (WELLBUTRIN XL) 300 MG 24 hr tablet Take 1 tablet (300 mg total) daily by mouth. 30 tablet 5  . Calcium Carbonate-Vitamin D (CALCIUM-D PO) Take 1 tablet by mouth daily.    . Collagen Hydrolysate POWD Take 1 scoop by mouth daily with supper.     . Cyanocobalamin (VITAMIN B-12) 5000 MCG TBDP Take 5,000 mcg by mouth daily.    . cycloSPORINE (RESTASIS) 0.05 % ophthalmic emulsion Place 1 drop into both eyes 2 (two)  times daily.     . furosemide (LASIX) 20 MG tablet TAKE 1 TABLET (20 MG TOTAL) BY MOUTH DAILY. USE AS NEEDED FOR SWELLING (Patient taking differently: Take 20 mg by mouth daily as needed (swelling). ) 90 tablet 1  . guaiFENesin (MUCINEX) 600 MG 12 hr tablet Take 1 tablet by mouth daily.     Marland Kitchen ipratropium (ATROVENT HFA) 17 MCG/ACT inhaler Inhale 2 puffs into the lungs every 6 (six) hours as needed for wheezing. 1 Inhaler 1  . loratadine (CLARITIN) 10 MG tablet Take 10 mg by mouth daily.    . Multiple Vitamin (MULTIVITAMIN WITH MINERALS) TABS tablet Take 1 tablet by mouth daily.    . Nasal Moisturizer Combination (RHINASE) SOLN Place 2 sprays into the nose 2 (two) times daily as needed (dry, bloody nostrils).     . Omega 3-6-9 Fatty Acids (OMEGA 3-6-9 COMPLEX PO) Take 1 capsule by mouth daily.    . OXYGEN Inhale 2-4 L into the lungs continuous. If moving around it is increased    . pantoprazole (PROTONIX) 20 MG tablet TAKE 1 TABLET (20 MG TOTAL) BY MOUTH DAILY BEFORE BREAKFAST 90 tablet 1  . phenylephrine (SUDAFED PE) 10 MG TABS tablet Take 10 mg by mouth daily.    . polyvinyl alcohol (ARTIFICIAL TEARS) 1.4 % ophthalmic solution Place 1 drop into both eyes See admin instructions. Instill 1 drop in to both eyes once or twice daily as needed  for dry eyes - in between restasis doses    . PREBIOTIC PRODUCT PO Take 1 scoop by mouth daily before breakfast.     . Respiratory Therapy Supplies (FLUTTER) DEVI 1 application by Does not apply route 2 (two) times daily. 1 each 0  . Tiotropium Bromide Monohydrate (SPIRIVA RESPIMAT) 2.5 MCG/ACT AERS Inhale 2 puffs into the lungs daily. 1 Inhaler 5  . valACYclovir (VALTREX) 1000 MG tablet Take 1 tablet (1,000 mg total) by mouth 3 (three) times daily. 21 tablet 0   No current facility-administered medications on file prior to visit.     Review of Systems:  As per HPI- otherwise negative. No fever or chills    Physical Examination: Vitals:   09/27/18 1114  BP: 128/70  Pulse: (!) 120  Resp: (!) 22  Temp: 98.1 F (36.7 C)  SpO2: 91%   Vitals:   09/27/18 1114  Weight: 206 lb (93.4 kg)  Height: 5\' 3"  (1.6 m)   Body mass index is 36.49 kg/m. Ideal Body Weight: Weight in (lb) to have BMI = 25: 140.8  GEN: WDWN, NAD, Non-toxic, A & O x 3, obese, looks well but is wearing oxygen  HEENT: Atraumatic, Normocephalic. Neck supple. No masses, No LAD.  Bilateral TM wnl, oropharynx normal.  PEERL,EOMI.   Ears and Nose: No external deformity. CV: RRR, No M/G/R. No JVD. No thrill. No extra heart sounds. PULM: CTA B, no wheezes, crackles, rhonchi. No retractions. No resp. distress. No accessory muscle use. ABD: S, NT, ND, +BS. No rebound. No HSM. EXTR: No c/c/e NEURO Normal gait.  PSYCH: Normally interactive. Conversant. Not depressed or anxious appearing.  Calm demeanor.    Assessment and Plan: Physical exam  Screening for hyperlipidemia - Plan: Lipid panel  Pulmonary sarcoidosis (HCC)  Depression, major, single episode, moderate (HCC)  PULMONARY SARCOIDOSIS  Screening for deficiency anemia - Plan: CBC  Screening for diabetes mellitus - Plan: Comprehensive metabolic panel, Hemoglobin A1c  Estrogen deficiency - Plan: DG Bone Density  Screening for breast cancer - Plan: MM  3D  SCREEN BREAST BILATERAL  [name] is here for physical today.  Overall she is doing well, other her pulmonary sarcoidosis continues to be an issue.  She is wearing oxygen 24/7.  Due for a mammogram bone density, I ordered both of these for her today.  We plan to repeat her Cologuard in the spring of this year.  [place] to her family later on this year.  We are glad to help her find appropriate specialisst near her new home when the time comes. [name] stopped taking her Wellbutrin, but feels that her mood is good.  She does not wish to start any other antidepressant at this time.  We will be in Day with her labs as soon as they come in.  Signed Abbe AmsterdamJessica Copland, MD  Addendum, received her labs.  Message to patient  Results for orders placed or performed in visit on 09/27/18  CBC  Result Value Ref Range   WBC 6.5 4.0 - 10.5 K/uL   RBC 4.31 3.87 - 5.11 Mil/uL   Platelets 278.0 150.0 - 400.0 K/uL   Hemoglobin 13.2 12.0 - 15.0 g/dL   HCT 40.939.8 81.136.0 - 91.446.0 %   MCV 92.5 78.0 - 100.0 fl   MCHC 33.1 30.0 - 36.0 g/dL   RDW 78.213.2 95.611.5 - 21.315.5 %  Comprehensive metabolic panel  Result Value Ref Range   Sodium 140 135 - 145 mEq/L   Potassium 4.2 3.5 - 5.1 mEq/L   Chloride 100 96 - 112 mEq/L   CO2 29 19 - 32 mEq/L   Glucose, Bld 83 70 - 99 mg/dL   BUN 18 6 - 23 mg/dL   Creatinine, Ser 0.861.28 (H) 0.40 - 1.20 mg/dL   Total Bilirubin 0.4 0.2 - 1.2 mg/dL   Alkaline Phosphatase 68 39 - 117 U/L   AST 26 0 - 37 U/L   ALT 17 0 - 35 U/L   Total Protein 8.4 (H) 6.0 - 8.3 g/dL   Albumin 4.5 3.5 - 5.2 g/dL   Calcium 57.810.3 8.4 - 46.910.5 mg/dL   GFR 62.9549.88 (L) >28.41>60.00 mL/min  Hemoglobin A1c  Result Value Ref Range   Hgb A1c MFr Bld 5.5 4.6 - 6.5 %  Lipid panel  Result Value Ref Range   Cholesterol 197 0 - 200 mg/dL   Triglycerides 32.488.0 0.0 - 149.0 mg/dL   HDL 40.1063.40 >27.25>39.00 mg/dL   VLDL 36.617.6 0.0 - 44.040.0 mg/dL   LDL Cholesterol 347116 (H) 0 - 99 mg/dL   Total CHOL/HDL  Ratio 3    NonHDL 133.18

## 2018-09-27 ENCOUNTER — Encounter: Payer: Self-pay | Admitting: Family Medicine

## 2018-09-27 ENCOUNTER — Ambulatory Visit (INDEPENDENT_AMBULATORY_CARE_PROVIDER_SITE_OTHER): Payer: Medicare Other | Admitting: Family Medicine

## 2018-09-27 VITALS — BP 128/70 | HR 106 | Temp 98.1°F | Resp 22 | Ht 63.0 in | Wt 206.0 lb

## 2018-09-27 DIAGNOSIS — Z130 Encounter for screening for diseases of the blood and blood-forming organs and certain disorders involving the immune mechanism: Secondary | ICD-10-CM | POA: Diagnosis not present

## 2018-09-27 DIAGNOSIS — D869 Sarcoidosis, unspecified: Secondary | ICD-10-CM | POA: Diagnosis not present

## 2018-09-27 DIAGNOSIS — Z131 Encounter for screening for diabetes mellitus: Secondary | ICD-10-CM

## 2018-09-27 DIAGNOSIS — F321 Major depressive disorder, single episode, moderate: Secondary | ICD-10-CM

## 2018-09-27 DIAGNOSIS — Z0000 Encounter for general adult medical examination without abnormal findings: Secondary | ICD-10-CM | POA: Diagnosis not present

## 2018-09-27 DIAGNOSIS — D860 Sarcoidosis of lung: Secondary | ICD-10-CM | POA: Diagnosis not present

## 2018-09-27 DIAGNOSIS — Z13220 Encounter for screening for lipoid disorders: Secondary | ICD-10-CM

## 2018-09-27 DIAGNOSIS — E2839 Other primary ovarian failure: Secondary | ICD-10-CM

## 2018-09-27 DIAGNOSIS — Z1239 Encounter for other screening for malignant neoplasm of breast: Secondary | ICD-10-CM

## 2018-09-27 LAB — CBC
HEMATOCRIT: 39.8 % (ref 36.0–46.0)
Hemoglobin: 13.2 g/dL (ref 12.0–15.0)
MCHC: 33.1 g/dL (ref 30.0–36.0)
MCV: 92.5 fl (ref 78.0–100.0)
Platelets: 278 10*3/uL (ref 150.0–400.0)
RBC: 4.31 Mil/uL (ref 3.87–5.11)
RDW: 13.2 % (ref 11.5–15.5)
WBC: 6.5 10*3/uL (ref 4.0–10.5)

## 2018-09-27 LAB — COMPREHENSIVE METABOLIC PANEL
ALT: 17 U/L (ref 0–35)
AST: 26 U/L (ref 0–37)
Albumin: 4.5 g/dL (ref 3.5–5.2)
Alkaline Phosphatase: 68 U/L (ref 39–117)
BUN: 18 mg/dL (ref 6–23)
CHLORIDE: 100 meq/L (ref 96–112)
CO2: 29 meq/L (ref 19–32)
CREATININE: 1.28 mg/dL — AB (ref 0.40–1.20)
Calcium: 10.3 mg/dL (ref 8.4–10.5)
GFR: 49.88 mL/min — ABNORMAL LOW (ref >60.00)
GLUCOSE: 83 mg/dL (ref 70–99)
Potassium: 4.2 mEq/L (ref 3.5–5.1)
SODIUM: 140 meq/L (ref 135–145)
Total Bilirubin: 0.4 mg/dL (ref 0.2–1.2)
Total Protein: 8.4 g/dL — ABNORMAL HIGH (ref 6.0–8.3)

## 2018-09-27 LAB — LIPID PANEL
Cholesterol: 197 mg/dL (ref 0–200)
HDL: 63.4 mg/dL (ref >39.00)
LDL CALC: 116 mg/dL — AB (ref 0–99)
NONHDL: 133.18
Total CHOL/HDL Ratio: 3
Triglycerides: 88 mg/dL (ref 0.0–149.0)
VLDL: 17.6 mg/dL (ref 0.0–40.0)

## 2018-09-27 LAB — HEMOGLOBIN A1C: Hgb A1c MFr Bld: 5.5 % (ref 4.6–6.5)

## 2018-09-27 NOTE — Patient Instructions (Addendum)
It was good to see you today I will be in touch with your labs asap We ordered a mammogram and a bone density for you to do at your convenience  We will set up the Cologuard test for you to do this spring- you are due for this in April  Let me know if you need anything    Health Maintenance After Age 70 After age 27, you are at a higher risk for certain long-term diseases and infections as well as injuries from falls. Falls are a major cause of broken bones and head injuries in people who are older than age 70. Getting regular preventive care can help to keep you healthy and well. Preventive care includes getting regular testing and making lifestyle changes as recommended by your health care provider. Talk with your health care provider about:  Which screenings and tests you should have. A screening is a test that checks for a disease when you have no symptoms.  A diet and exercise plan that is right for you. What should I know about screenings and tests to prevent falls? Screening and testing are the best ways to find a health problem early. Early diagnosis and treatment give you the best chance of managing medical conditions that are common after age 19. Certain conditions and lifestyle choices may make you more likely to have a fall. Your health care provider may recommend:  Regular vision checks. Poor vision and conditions such as cataracts can make you more likely to have a fall. If you wear glasses, make sure to get your prescription updated if your vision changes.  Medicine review. Work with your health care provider to regularly review all of the medicines you are taking, including over-the-counter medicines. Ask your health care provider about any side effects that may make you more likely to have a fall. Tell your health care provider if any medicines that you take make you feel dizzy or sleepy.  Osteoporosis screening. Osteoporosis is a condition that causes the bones to get weaker.  This can make the bones weak and cause them to break more easily.  Blood pressure screening. Blood pressure changes and medicines to control blood pressure can make you feel dizzy.  Strength and balance checks. Your health care provider may recommend certain tests to check your strength and balance while standing, walking, or changing positions.  Foot health exam. Foot pain and numbness, as well as not wearing proper footwear, can make you more likely to have a fall.  Depression screening. You may be more likely to have a fall if you have a fear of falling, feel emotionally low, or feel unable to do activities that you used to do.  Alcohol use screening. Using too much alcohol can affect your balance and may make you more likely to have a fall. What actions can I take to lower my risk of falls? General instructions  Talk with your health care provider about your risks for falling. Tell your health care provider if: ? You fall. Be sure to tell your health care provider about all falls, even ones that seem minor. ? You feel dizzy, sleepy, or off-balance.  Take over-the-counter and prescription medicines only as told by your health care provider. These include any supplements.  Eat a healthy diet and maintain a healthy weight. A healthy diet includes low-fat dairy products, low-fat (lean) meats, and fiber from whole grains, beans, and lots of fruits and vegetables. Home safety  Remove any tripping hazards, such as  rugs, cords, and clutter.  Install safety equipment such as grab bars in bathrooms and safety rails on stairs.  Keep rooms and walkways well-lit. Activity   Follow a regular exercise program to stay fit. This will help you maintain your balance. Ask your health care provider what types of exercise are appropriate for you.  If you need a cane or walker, use it as recommended by your health care provider.  Wear supportive shoes that have nonskid soles. Lifestyle  Do not  drink alcohol if your health care provider tells you not to drink.  If you drink alcohol, limit how much you have: ? 0-1 drink a day for women. ? 0-2 drinks a day for men.  Be aware of how much alcohol is in your drink. In the [place] one drink equals one typical bottle of beer (12 oz), one-half glass of wine (5 oz), or one shot of hard liquor (1 oz).  Do not use any products that contain nicotine or tobacco, such as cigarettes and e-cigarettes. If you need help quitting, ask your health care provider. Summary  Having a healthy lifestyle and getting preventive care can help to protect your health and wellness after age 40.  Screening and testing are the best way to find a health problem early and help you avoid having a fall. Early diagnosis and treatment give you the best chance for managing medical conditions that are more common for people who are older than age 34.  Falls are a major cause of broken bones and head injuries in people who are older than age 34. Take precautions to prevent a fall at home.  Work with your health care provider to learn what changes you can make to improve your health and wellness and to prevent falls. This information is not intended to replace advice given to you by your health care provider. Make sure you discuss any questions you have with your health care provider. Document Released: 07/08/2017 Document Revised: 07/08/2017 Document Reviewed: 07/08/2017 Elsevier Interactive Patient Education  2019 [employer].

## 2018-10-15 ENCOUNTER — Ambulatory Visit (HOSPITAL_BASED_OUTPATIENT_CLINIC_OR_DEPARTMENT_OTHER): Payer: Self-pay

## 2018-10-15 ENCOUNTER — Inpatient Hospital Stay (HOSPITAL_BASED_OUTPATIENT_CLINIC_OR_DEPARTMENT_OTHER): Admission: RE | Admit: 2018-10-15 | Payer: Self-pay | Source: Ambulatory Visit

## 2018-10-19 ENCOUNTER — Other Ambulatory Visit (HOSPITAL_BASED_OUTPATIENT_CLINIC_OR_DEPARTMENT_OTHER): Payer: Self-pay

## 2018-10-19 ENCOUNTER — Ambulatory Visit (HOSPITAL_BASED_OUTPATIENT_CLINIC_OR_DEPARTMENT_OTHER): Payer: Self-pay

## 2018-10-25 DIAGNOSIS — J45909 Unspecified asthma, uncomplicated: Secondary | ICD-10-CM | POA: Diagnosis not present

## 2018-10-25 DIAGNOSIS — D869 Sarcoidosis, unspecified: Secondary | ICD-10-CM | POA: Diagnosis not present

## 2018-10-27 DIAGNOSIS — Z1212 Encounter for screening for malignant neoplasm of rectum: Secondary | ICD-10-CM | POA: Diagnosis not present

## 2018-10-27 DIAGNOSIS — Z1211 Encounter for screening for malignant neoplasm of colon: Secondary | ICD-10-CM | POA: Diagnosis not present

## 2018-10-29 ENCOUNTER — Ambulatory Visit (HOSPITAL_BASED_OUTPATIENT_CLINIC_OR_DEPARTMENT_OTHER): Payer: Self-pay

## 2018-10-29 ENCOUNTER — Other Ambulatory Visit (HOSPITAL_BASED_OUTPATIENT_CLINIC_OR_DEPARTMENT_OTHER): Payer: Self-pay

## 2018-10-31 LAB — COLOGUARD

## 2018-11-01 ENCOUNTER — Encounter: Payer: Self-pay | Admitting: Family Medicine

## 2018-11-12 ENCOUNTER — Other Ambulatory Visit (HOSPITAL_BASED_OUTPATIENT_CLINIC_OR_DEPARTMENT_OTHER): Payer: Self-pay

## 2018-11-12 ENCOUNTER — Ambulatory Visit (HOSPITAL_BASED_OUTPATIENT_CLINIC_OR_DEPARTMENT_OTHER): Payer: Self-pay

## 2018-11-23 DIAGNOSIS — J45909 Unspecified asthma, uncomplicated: Secondary | ICD-10-CM | POA: Diagnosis not present

## 2018-11-23 DIAGNOSIS — D869 Sarcoidosis, unspecified: Secondary | ICD-10-CM | POA: Diagnosis not present

## 2018-12-10 ENCOUNTER — Encounter: Payer: Self-pay | Admitting: Gastroenterology

## 2018-12-22 ENCOUNTER — Other Ambulatory Visit: Payer: Self-pay | Admitting: Family Medicine

## 2018-12-22 DIAGNOSIS — D860 Sarcoidosis of lung: Secondary | ICD-10-CM

## 2018-12-24 ENCOUNTER — Ambulatory Visit (INDEPENDENT_AMBULATORY_CARE_PROVIDER_SITE_OTHER): Payer: Medicare Other | Admitting: Medical

## 2018-12-24 ENCOUNTER — Ambulatory Visit: Payer: Self-pay | Admitting: *Deleted

## 2018-12-24 ENCOUNTER — Encounter: Payer: Self-pay | Admitting: Medical

## 2018-12-24 ENCOUNTER — Other Ambulatory Visit: Payer: Self-pay

## 2018-12-24 VITALS — BP 133/100 | HR 84 | Temp 97.2°F

## 2018-12-24 DIAGNOSIS — R05 Cough: Secondary | ICD-10-CM | POA: Diagnosis not present

## 2018-12-24 DIAGNOSIS — R51 Headache: Secondary | ICD-10-CM | POA: Diagnosis not present

## 2018-12-24 DIAGNOSIS — R059 Cough, unspecified: Secondary | ICD-10-CM

## 2018-12-24 DIAGNOSIS — R110 Nausea: Secondary | ICD-10-CM | POA: Diagnosis not present

## 2018-12-24 DIAGNOSIS — R197 Diarrhea, unspecified: Secondary | ICD-10-CM

## 2018-12-24 DIAGNOSIS — D869 Sarcoidosis, unspecified: Secondary | ICD-10-CM

## 2018-12-24 DIAGNOSIS — R519 Headache, unspecified: Secondary | ICD-10-CM

## 2018-12-24 DIAGNOSIS — R062 Wheezing: Secondary | ICD-10-CM

## 2018-12-24 DIAGNOSIS — J45909 Unspecified asthma, uncomplicated: Secondary | ICD-10-CM | POA: Diagnosis not present

## 2018-12-24 MED ORDER — TRAMADOL HCL 50 MG PO TABS: ORAL_TABLET | ORAL | 0 refills | Status: DC

## 2018-12-24 MED ORDER — ONDANSETRON 4 MG PO TBDP: 4.0000 mg | ORAL_TABLET | Freq: Three times a day (TID) | ORAL | 0 refills | Status: DC | PRN

## 2018-12-24 MED ORDER — PREDNISONE 10 MG PO TABS: ORAL_TABLET | ORAL | 0 refills | Status: DC

## 2018-12-24 NOTE — Telephone Encounter (Signed)
Patient has been scheduled to see [name] today un absence of PCP.

## 2018-12-24 NOTE — Telephone Encounter (Signed)
Pt calling with having several symptoms, diarrhea, headaches, nausea that started last night, no vomiting. She has taking Tylenol and pain # came down to 8.  She has not traveled out of her house since January. She would like to speak with her provider regarding this issue.  Advised pt that she would have to have a virtual visit and she voiced understanding. She does not have respiratory distress, is on oxygen continuously. Home care advice given with verbal understanding. [facility] at [facility] notified regarding an appointment.   Call conference in with LB at [facility].   Reason for Disposition . [1] MODERATE headache (e.g., interferes with normal activities) AND [2] present > 24 hours AND [3] unexplained  (Exceptions: analgesics not tried, typical migraine, or headache part of viral illness)  Answer Assessment - Initial Assessment Questions LOCATION: "Where does it hurt?"      Front of head, from the eyes going to the top 2. ONSET: "When did the headache start?" (Minutes, hours or days)      yesterday 3. PATTERN: "Does the pain come and go, or has it been constant since it started?"     constant 4. SEVERITY: "How bad is the pain?" and "What does it keep you from doing?"  (e.g., Scale 1-10; mild, moderate, or severe)   - MILD (1-3): doesn't interfere with normal activities    - MODERATE (4-7): interferes with normal activities or awakens from sleep    - SEVERE (8-10): excruciating pain, unable to do any normal activities        Pain # 8 after taking Tylenol 5. RECURRENT SYMPTOM: "Have you ever had headaches before?" If so, ask: "When was the last time?" and "What happened that time?"      No, this is worst 6. CAUSE: "What do you think is causing the headache?"     Sinus? 7. MIGRAINE: "Have you been diagnosed with migraine headaches?" If so, ask: "Is this headache similar?"      Yes has hx of migraine 8. HEAD INJURY: "Has there been any recent injury to the head?"      no 9. OTHER  SYMPTOMS: "Do you have any other symptoms?" (fever, stiff neck, eye pain, sore throat, cold symptoms)     Eye pain, low grade fever (98.6)  10. PREGNANCY: "Is there any chance you are pregnant?" "When was your last menstrual period?"       n/a  Protocols used: [facility]

## 2018-12-24 NOTE — Patient Instructions (Signed)
Had a video visit with patient today. Onset of signs and symptoms since this am. Early in presentation. Longer I talked with her she appeared stable.   Vitals are also stable.  With her age and upcoming weekend offered to get labs in office cmp, cbc and low dose toradol for ha. She declined.  Her motor function appeared normal as she got out bp cuff and set it up to check bp.  For diarrhea recommend bland diet, hydrate with propel and immmodium otc If worsens will need to get stool panel studies.  zofran for nauseau  For ha advised to hydrate well. Making tramadol available. rx advisment given(nurse to call and explain chose this in place of fiornal. Epic note asprin issue). Sedation side effect explained through MA.  For wheezing rx 4 day low dose prednisone. Conitinue atrovent and albuterol  Chronic intermittent cough for year. Not worse than baseline but if worsens let us know. If cough productive let us know. Check temp for fever. If any fever `100 or above notify us.  Not supiscious for covid but counseled pt. She is sheltering at home. Husband running errand and very cautious per pt.  Update me on Monday how she is.  If worse signs or symptoms over weekend then ED or UC eval.

## 2018-12-24 NOTE — Progress Notes (Signed)
Subjective:    Patient ID: [name], female    DOB: [birth year], 68 y.o.   MRN: [id number]  HPI  Virtual Visit via Video Note  I connected with CLARESE AKIN on 12/24/18 at 10:00 AM EDT by a video enabled telemedicine application and verified that I am speaking with the correct person using two identifiers.   I discussed the limitations of evaluation and management by telemedicine and the availability of in person appointments. The patient expressed understanding and agreed to proceed.   History of Present Illness:  Pt states she started with some mild loose stools last night, nausuea, and HA. Pt mentions Sunday had faint mild st. Pt has been coughing mild intermittent but she always has mild cough baseline. She has sarcodoisis and is on oxgen. Remote history of smoking. Pt temp last night 98.8. Pt thinks wheezing is little worse than usual. She states has used atrovent and this helped.   Pt had about 3 loose stools since last night. No recent antbiotic. Pt lives with her husband. Pt had not been outside of her house since January. Her husband has been running errands for her. Husband has not bee sick.   Pt did wake up with HA this morning. Pt states was severe. She took tylenol this morning and her HA is better with tylenol. Pt does have hx of migraines. Pt does get light sensitivie.  Pt does know where her blood pressure cuff is.  Over last 24 hours feeling tired.     Observations/Objective: No acute distress.   Assessment and Plan: Had a video visit with patient today. Onset of signs and symptoms since this am. Early in presentation. Longer I talked with her she appeared stable.   Vitals are also stable.  With her age and upcoming weekend offered to get labs in office cmp, cbc and low dose toradol for ha. She declined.  Her motor function appeared normal as she got out bp cuff and set it up to check bp.  For diarrhea recommend bland diet, hydrate with propel  and immmodium otc If worsens will need to get stool panel studies.  zofran for nauseau  For ha advised to hydrate well. Making tramadol available. rx advisment given(nurse to call and explain chose this in place of fiornal. Epic note asprin issue). Sedation side effect explained through MA.  For wheezing rx 4 day low dose prednisone. Conitinue atrovent and albuterol  Chronic intermittent cough for year. Not worse than baseline but if worsens let us know. If cough productive let us know. Check temp for fever. If any fever `100 or above notify us.  Not supiscious for covid but counseled pt. She is sheltering at home. Husband running errand and very cautious per pt.  40 minutes spent with pt. 50% of time spent counseling on plan of care going forward. Extra time since doing video visit, can't do labs as she declined and upcoming weekend.  Follow Up Instructions:    I discussed the assessment and treatment plan with the patient. The patient was provided an opportunity to ask questions and all were answered. The patient agreed with the plan and demonstrated an understanding of the instructions.   The patient was advised to call back or seek an in-person evaluation if the symptoms worsen or if the condition fails to improve as anticipated.  I provided 40 minutes of non-face-to-face time during this encounter.   Esperanza Richters, PA-C   Review of Systems  Constitutional: Positive for fatigue.  Mild  HENT: Positive for sore throat. Negative for congestion, ear discharge and ear pain.        Faint st.  Eyes: Negative for pain.  Respiratory: Positive for wheezing. Negative for cough and chest tightness.   Cardiovascular: Negative for chest pain and palpitations.  Gastrointestinal: Negative for abdominal distention, abdominal pain, constipation, diarrhea and vomiting.  Genitourinary: Negative for dysuria, flank pain, frequency, genital sores, pelvic pain and vaginal pain.   Musculoskeletal: Negative for back pain.  Skin: Negative for rash.  Neurological: Positive for headaches. Negative for dizziness, tremors, seizures, syncope, weakness and light-headedness.  Hematological: Negative for adenopathy. Does not bruise/bleed easily.  Psychiatric/Behavioral: Negative for behavioral problems, dysphoric mood, self-injury and suicidal ideas. The patient is not nervous/anxious.        Objective:   Physical Exam  No acute distress. Neuro- good coordination and normal function seen checking her own bp.      Assessment & Plan:

## 2018-12-27 ENCOUNTER — Telehealth: Payer: Self-pay | Admitting: Family Medicine

## 2018-12-27 NOTE — Telephone Encounter (Signed)
Copied from CRM [id number]. Topic: Quick Communication - See Telephone Encounter >> Dec 27, 2018 11:27 AM Lorayne Bender wrote: CRM for notification. See Telephone encounter for: 12/27/18.  Pt states she was told at E-Visit to call back on Monday and report how she is doing: Pt is feeling better - medication is working - headache is down to a 1 or 2, nothing to really talk about - Blood Pressure is normal - temperature is still in the 97.5 range - pt had a small bowel movement today after feeling some cramping that was more solid - pt still has slight nausea (thinks this may be from GERD), still on a bland diet. Pt can be reached at [phone].

## 2018-12-28 NOTE — Telephone Encounter (Signed)
Glad to hear she is better. Regarding nausea and her thought of it being caused by gerd. She could take pepcid other the counter amd continue pantoprazole.See if nausea resolves.

## 2019-01-10 ENCOUNTER — Ambulatory Visit: Payer: Self-pay

## 2019-01-10 NOTE — Telephone Encounter (Signed)
Patient called and says she has shingles. She says she woke up this morning with a cluster of rash to the lower part of her scalp on the right side near the right ear, down the back of her neck on the right side to the clavicle and on her scalp. She says it's not itching, but very painful at a 7-8. She says she's taking Benadryl for her allergies. She says he has a fever of 99.4 and her body feels hot and prickly on the right side, also has a headache from her allergies. I advised the office is closed and tomorrow someone will call to set up a virtual visit with a provider, care advice given, she verbalized understanding.   Reason for Disposition . [1] Shingles rash (matches SYMPTOMS) AND [2] onset within past 72 hours  Answer Assessment - Initial Assessment Questions 1. APPEARANCE of RASH: "Describe the rash."      Cluster of what looks like pimples 2. LOCATION: "Where is the rash located?"      Lower part of the scalp on the right near my ear, in the scalp, up and down the back neck and right clavicle 3. ONSET: "When did the rash start?"      This morning it was there when I woke up 4. ITCHING: "Does the rash itch?" If so, ask: "How bad is the itch?"  (Scale 1-10; or mild, moderate, severe)     No 5. PAIN: "Does the rash hurt?" If so, ask: "How bad is the pain?"  (Scale 1-10; or mild, moderate, severe)     8 6. OTHER SYMPTOMS: "Do you have any other symptoms?" (e.g., fever)     Fever 99.4, right side body feels tingly and hot, headache 7. PREGNANCY: "Is there any chance you are pregnant?" "When was your last menstrual period?"     No  Protocols used: [facility]

## 2019-01-11 NOTE — Progress Notes (Signed)
[facility] [address] [place], [place] [address] [phone]  Date:  01/12/2019   Name:  [name]   DOB:  [birth year]   MRN:  [id number]  PCP:  Pearline Cables, MD    Chief Complaint: No chief complaint on file.   History of Present Illness:  [name] is a 68 y.o. very pleasant female patient who presents with the following:  Virtual visit today due to pandemic.  We plan for a doxy.me visit, but the patient did not have camera on her phone.  Converted to telephone Patient location is home Provider location is home Patient identity confirmed with name and date of birth- she gives consent for virtual visit today [name] had requested visit today due to concern of shingles Today is Wednesday- on Monday she noted a bump on her chin. It is on the right side. She thought it was a pimple and went to pop it- however it felt sore, her husband thought that it might be shingles It is tingling, burning, more painful that she would expect She has developed a cluster of lesions around the original lesion at this time.  She has some tenderness of associated lymph nodes.  Her throat feels a bit sore on the right side No lesions near or involvement of her eye She describes the rash as coming down towards her right clavicle.  Suspect this may be be C2 dermatome  History of pulmonary sarcoidosis, chronic respiratory failure with hypoxia, upper airway cough syndrome, spontaneous pneumothorax, IBS, GERD  We did a physical back in January Her pulmonologist is Dr. Craige Cotta She wears oxygen at home, between 1.5 to 3 L depending on activity level She is on oxygen 24/7  She did a sick visit with Ramon Dredge just about 2 weeks ago with headache-headache is now resolved.  She wonders if this is early prodrome of shingles  ?  Did she ever get Shingrix- she never ended up doing this vaccine due to deductible not being fulfilled.   However she does plan to get this once healed up  Patient Active Problem List   Diagnosis Date Noted  . Other chest pain   . Spontaneous pneumothorax 04/18/2017  . SOB (shortness of breath) 06/05/2016  . Osteopenia 02/12/2016  . Lumbar radiculopathy 01/26/2015  . Chronic respiratory failure with hypoxia (HCC) 04/12/2014  . Obesity (BMI 30-39.9) 02/08/2014  . Allergic rhinitis 09/05/2010  . IBS 09/05/2010  . GERD 02/18/2010  . Barrett's esophagus 02/12/2010  . Upper airway cough syndrome 11/02/2009  . Anxiety state 08/29/2008  . History of colonic polyps 08/29/2008  . PULMONARY SARCOIDOSIS 08/12/2007  . Mild persistent asthma in adult without complication with component of vcd 08/12/2007  . Diaphragmatic hernia 08/12/2007  . Migraine 08/12/2007    Past Medical History:  Diagnosis Date  . Anemia   . Anxiety   . Asthma   . Barrett esophagus   . Depression   . GERD (gastroesophageal reflux disease)   . Hiatal hernia   . IBS (irritable bowel syndrome)   . Migraines   . Pneumothorax   . Pulmonary sarcoidosis (HCC)   . Supplemental oxygen dependent   . Wears dentures     Past Surgical History:  Procedure Laterality Date  . APPENDECTOMY    . BRAVO PH STUDY N/A 10/10/2013   Procedure: BRAVO PH STUDY;  Surgeon: Hart Carwin, MD;  Location: [facility];  Service: Endoscopy;  Laterality: N/A;  placed 27  . BRONCHIAL BIOPSY  1996  . CATARACT EXTRACTION, BILATERAL    . CHEST TUBE INSERTION Right 04/23/2017   Procedure: CHEST TUBE INSERTION;  Surgeon: [name], MD;  Location: [facility] OR;  Service: Thoracic;  Laterality: Right;  . CHOLECYSTECTOMY    . ESOPHAGOGASTRODUODENOSCOPY N/A 10/10/2013   Procedure: ESOPHAGOGASTRODUODENOSCOPY (EGD);  Surgeon: Hart Carwin, MD;  Location: [facility];  Service: Endoscopy;  Laterality: N/A;  pt asthmatic, RA 92% at rest.  Coughing frequently. Wheezes noted in upper lung fields at auscultation. Pt instructed to use home inhaler prior to  procedure and placed on supportive O2 @ 2 lpm in pre-procedural. Teaching done ie. use of inhaler. Pt states she ju  . NISSEN FUNDOPLICATION    . ROTATOR CUFF REPAIR     x3(2 on L, 1 on R)  . VIDEO BRONCHOSCOPY WITH INSERTION OF INTERBRONCHIAL VALVE (IBV) N/A 05/08/2017   Procedure: VIDEO BRONCHOSCOPY WITH INSERTION OF INTERBRONCHIAL VALVE (IBV);  Surgeon: Loreli Slot, MD;  Location: [facility] OR;  Service: Thoracic;  Laterality: N/A;  . VIDEO BRONCHOSCOPY WITH INSERTION OF INTERBRONCHIAL VALVE (IBV) N/A 07/10/2017   Procedure: VIDEO BRONCHOSCOPY FOR REMOVAL OF ENDOSCOPIC BRONCHIAL VALVE;  Surgeon: [name], MD;  Location: [facility] OR;  Service: Thoracic;  Laterality: N/A;    Social History   Tobacco Use  . Smoking status: Former Smoker    Packs/day: 1.00    Years: 20.00    Pack years: 20.00    Types: Cigarettes    Last attempt to quit: 09/08/1978    Years since quitting: 40.3  . Smokeless tobacco: Never Used  Substance Use Topics  . Alcohol use: No  . Drug use: No    Family History  Adopted: Yes    Allergies  Allergen Reactions  . Gabapentin Swelling    EDEMA WITH POSITIVE RECHALLENGE  . Lactose Intolerance (Gi) Diarrhea and Other (See Comments)    Stomach cramps  . Pineapple Hives and Itching  . Penicillins Other (See Comments)    Unknown - childhood allergy Has patient had a PCN reaction causing immediate rash, facial/tongue/throat swelling, SOB or lightheadedness with hypotension: Unknown Has patient had a PCN reaction causing severe rash involving mucus membranes or skin necrosis: Unknown Has patient had a PCN reaction that required hospitalization: Unknown Has patient had a PCN reaction occurring within the last 10 years: No If all of the above answers are "NO", then may proceed with Cephalosporin use.  . Aspirin Other (See Comments)    gastritis    Medication list has been reviewed and updated.  Current Outpatient Medications on File Prior to Visit   Medication Sig Dispense Refill  . ALOE VERA JUICE PO Take 120 mLs by mouth daily.     . Ascorbic Acid (VITAMIN C PO) Take 1 tablet by mouth daily. 1000 MG    . ASTRAGALUS PO Take 1 tablet by mouth daily.     . budesonide-formoterol (SYMBICORT) 160-4.5 MCG/ACT inhaler Inhale 2 puffs into the lungs 2 (two) times daily. 1 Inhaler 11  . Calcium Carbonate-Vitamin D (CALCIUM-D PO) Take 1 tablet by mouth daily.    . Collagen Hydrolysate POWD Take 1 scoop by mouth daily with supper.     . Cyanocobalamin (VITAMIN B-12) 5000 MCG TBDP Take 5,000 mcg by mouth daily.    . cycloSPORINE (RESTASIS) 0.05 % ophthalmic emulsion Place 1 drop into both eyes 2 (two) times daily.     . furosemide (LASIX) 20 MG  tablet TAKE 1 TABLET (20 MG TOTAL) BY MOUTH DAILY. USE AS NEEDED FOR SWELLING (Patient taking differently: Take 20 mg by mouth daily as needed (swelling). ) 90 tablet 1  . guaiFENesin (MUCINEX) 600 MG 12 hr tablet Take 1 tablet by mouth daily.     Marland Kitchen ipratropium (ATROVENT HFA) 17 MCG/ACT inhaler Inhale 2 puffs into the lungs every 6 (six) hours as needed for wheezing. 1 Inhaler 1  . loratadine (CLARITIN) 10 MG tablet Take 10 mg by mouth daily.    . Multiple Vitamin (MULTIVITAMIN WITH MINERALS) TABS tablet Take 1 tablet by mouth daily.    . Nasal Moisturizer Combination (RHINASE) SOLN Place 2 sprays into the nose 2 (two) times daily as needed (dry, bloody nostrils).     . Omega 3-6-9 Fatty Acids (OMEGA 3-6-9 COMPLEX PO) Take 1 capsule by mouth daily.    . ondansetron (ZOFRAN ODT) 4 MG disintegrating tablet Take 1 tablet (4 mg total) by mouth every 8 (eight) hours as needed for nausea or vomiting. 20 tablet 0  . OXYGEN Inhale 2-4 L into the lungs continuous. If moving around it is increased    . pantoprazole (PROTONIX) 20 MG tablet TAKE 1 TABLET (20 MG TOTAL) BY MOUTH DAILY BEFORE BREAKFAST 90 tablet 1  . phenylephrine (SUDAFED PE) 10 MG TABS tablet Take 10 mg by mouth daily.    . polyvinyl alcohol (ARTIFICIAL  TEARS) 1.4 % ophthalmic solution Place 1 drop into both eyes See admin instructions. Instill 1 drop in to both eyes once or twice daily as needed for dry eyes - in between restasis doses    . PREBIOTIC PRODUCT PO Take 1 scoop by mouth daily before breakfast.     . predniSONE (DELTASONE) 10 MG tablet 4 tab po day 1, 3 tab po day 2, 2 tab po day 3, 1 tab po day 4 10 tablet 0  . Respiratory Therapy Supplies (FLUTTER) DEVI 1 application by Does not apply route 2 (two) times daily. 1 each 0  . Tiotropium Bromide Monohydrate (SPIRIVA RESPIMAT) 2.5 MCG/ACT AERS Inhale 2 puffs into the lungs daily. 1 Inhaler 5  . traMADol (ULTRAM) 50 MG tablet 1 tab po q 6 hours as needed severe ha 6 tablet 0  . valACYclovir (VALTREX) 1000 MG tablet Take 1 tablet (1,000 mg total) by mouth 3 (three) times daily. 21 tablet 0  . VENTOLIN HFA 108 (90 Base) MCG/ACT inhaler INHALE 2 PUFFS INTO THE LUNGS EVERY 4 (FOUR) HOURS AS NEEDED FOR WHEEZING OR SHORTNESS OF BREATH. 18 Inhaler 3   No current facility-administered medications on file prior to visit.     Review of Systems:  As per HPI- otherwise negative. She has felt hot She has her normal cough due to sarcoidosis Can be productive at times -no change here  Physical Examination: There were no vitals filed for this visit. There were no vitals filed for this visit. There is no height or weight on file to calculate BMI. Ideal Body Weight:    Two days ago her temp was 99.1, normal since Her oxygen sats have been normal- 95% this am, pulse 84 2 days ago her BP was ok  Assessment and Plan: Herpes zoster without complication - Plan: valACYclovir (VALTREX) 1000 MG tablet  Virtual visit today for shingles.  Called in Valtrex for her, explained how to use Advised her that she may get the Shingrix vaccine as soon as her rash is fully healed.  She intends to do this ASAP I  asked Britta MccreedyBarbara to contact me if her rash is getting worse, not getting better, or if she has any  involvement around her eye Patient is sent the following instructions on my chart:  It was nice to speak with you today As we discussed, it sounds like you have shingles.  I sent in a prescription for Valtrex for you to use, take it 3 times a day for 1 week Please let me know if your rash is not getting better in the next several days.  If it is getting worse, is starting to involve the eye area, or any other concerns please contact me  You can get the Shingrix vaccine as soon as your rash is fully healed Spoke with Britta MccreedyBarbara today for 13:22 Signed Abbe AmsterdamJessica Macrae Wiegman, MD

## 2019-01-11 NOTE — Telephone Encounter (Signed)
Needs virtual visit 

## 2019-01-12 ENCOUNTER — Other Ambulatory Visit: Payer: Self-pay

## 2019-01-12 ENCOUNTER — Ambulatory Visit (INDEPENDENT_AMBULATORY_CARE_PROVIDER_SITE_OTHER): Payer: Medicare Other | Admitting: Family Medicine

## 2019-01-12 ENCOUNTER — Encounter: Payer: Self-pay | Admitting: Family Medicine

## 2019-01-12 DIAGNOSIS — B029 Zoster without complications: Secondary | ICD-10-CM | POA: Diagnosis not present

## 2019-01-12 MED ORDER — VALACYCLOVIR HCL 1 G PO TABS: 1000.0000 mg | ORAL_TABLET | Freq: Three times a day (TID) | ORAL | 0 refills | Status: DC

## 2019-01-12 NOTE — Patient Instructions (Signed)
It was nice to speak with you today As we discussed, it sounds like you have shingles.  I sent in a prescription for Valtrex for you to use, take it 3 times a day for 1 week Please let me know if your rash is not getting better in the next several days.  If it is getting worse, is starting to involve the eye area, or any other concerns please contact me  You can get the Shingrix vaccine as soon as your rash is fully healed

## 2019-01-13 ENCOUNTER — Encounter: Payer: Self-pay | Admitting: Family Medicine

## 2019-01-13 ENCOUNTER — Ambulatory Visit (INDEPENDENT_AMBULATORY_CARE_PROVIDER_SITE_OTHER): Payer: Medicare Other | Admitting: Family Medicine

## 2019-01-13 ENCOUNTER — Other Ambulatory Visit: Payer: Self-pay

## 2019-01-13 ENCOUNTER — Ambulatory Visit: Payer: Self-pay

## 2019-01-13 DIAGNOSIS — B029 Zoster without complications: Secondary | ICD-10-CM

## 2019-01-13 MED ORDER — HYDROCODONE-ACETAMINOPHEN 5-325 MG PO TABS: 1.0000 | ORAL_TABLET | Freq: Four times a day (QID) | ORAL | 0 refills | Status: AC | PRN

## 2019-01-13 NOTE — Telephone Encounter (Signed)
Pt c/o severe burning pain and itching to right face , neck and upper chest near the neck Pt was dx with Shingles yesterday.  Pt called c/o severe burning pain, itching. Pt stated that the pain is "unbearable."  Pt's call transferred to office to schedule virtual visit.   Reason for Disposition . SEVERE pain (e.g., excruciating)  Answer Assessment - Initial Assessment Questions 1. APPEARANCE of RASH: "Describe the rash."      Red with blisters 2. LOCATION: "Where is the rash located?"      Right face, neck and upper chest 3. ONSET: "When did the rash start?"      Monday 4. ITCHING: "Does the rash itch?" If so, ask: "How bad is the itch?"  (Scale 1-10; or mild, moderate, severe)     severe 5. PAIN: "Does the rash hurt?" If so, ask: "How bad is the pain?"  (Scale 1-10; or mild, moderate, severe)     Severe burning pain 6. OTHER SYMPTOMS: "Do you have any other symptoms?" (e.g., fever)     no 7. PREGNANCY: "Is there any chance you are pregnant?" "When was your last menstrual period?"    n/a  Protocols used: [facility]

## 2019-01-13 NOTE — Progress Notes (Signed)
[facility] [address], [address] [place], [place] [address] [phone] [phone]  Date:  01/13/2019   Name:  Margaretha SeedsBarbara V Hise   DOB:  1950/11/29   MRN:  [id number]  PCP:  Pearline Cablesopland, Jessica C, MD    Chief Complaint: No chief complaint on file.   History of Present Illness:  Margaretha SeedsBarbara V Brouse is a 68 y.o. very pleasant female patient who presents with the following:  Virtual visit today for concern of shingles. I actually just spoke with her about this yesterday, we started her on Valtrex From her description it sounds like her C2 dermatome is affected  She then sent me the following message, and was scheduled for virtual visit:  Good morning Dr Patsy Lageropland! The itching and pain from the shingles rash is intolerable. Is there an OTC I that will help, if not can you please call in sometime to my pharmacy rather will take away the itch and pain and allow me to get some sleep?  Thank you   I reviewed her controlled substance database, nothing of note She has used oxycodone in the past, also tramadol and hydrocodone syrup-no allergy or intolerance  The shingles is on her chin, behind her ear and on her scalp.  It is not around her eye area She has to wear oxygen via [facility], so the shingles around her ear is really bothering her  No fever noted  She is having a hard time with pain and itching, she is unable to sleep.  She would really like to try some medication for pain relief She did start on Valtrex, got 2 doses yesterday   Patient Active Problem List   Diagnosis Date Noted  . Other chest pain   . Spontaneous pneumothorax 04/18/2017  . SOB (shortness of breath) 06/05/2016  . Osteopenia 02/12/2016  . Lumbar radiculopathy 01/26/2015  . Chronic respiratory failure with hypoxia (HCC) 04/12/2014  . Obesity (BMI 30-39.9) 02/08/2014  . Allergic rhinitis 09/05/2010  . IBS 09/05/2010  . GERD 02/18/2010  . Barrett's esophagus 02/12/2010  . Upper  airway cough syndrome 11/02/2009  . Anxiety state 08/29/2008  . History of colonic polyps 08/29/2008  . PULMONARY SARCOIDOSIS 08/12/2007  . Mild persistent asthma in adult without complication with component of vcd 08/12/2007  . Diaphragmatic hernia 08/12/2007  . Migraine 08/12/2007    Past Medical History:  Diagnosis Date  . Anemia   . Anxiety   . Asthma   . Barrett esophagus   . Depression   . GERD (gastroesophageal reflux disease)   . Hiatal hernia   . IBS (irritable bowel syndrome)   . Migraines   . Pneumothorax   . Pulmonary sarcoidosis (HCC)   . Supplemental oxygen dependent   . Wears dentures     Past Surgical History:  Procedure Laterality Date  . APPENDECTOMY    . BRAVO PH STUDY N/A 10/10/2013   Procedure: BRAVO PH STUDY;  Surgeon: Hart Carwinora M Brodie, MD;  Location: [facility];  Service: Endoscopy;  Laterality: N/A;  placed 27  . BRONCHIAL BIOPSY  1996  . CATARACT EXTRACTION, BILATERAL    . CHEST TUBE INSERTION Right 04/23/2017   Procedure: CHEST TUBE INSERTION;  Surgeon: Kerin PernaVan Trigt, Peter, MD;  [facility];  Service: Thoracic;  Laterality: Right;  . CHOLECYSTECTOMY    . ESOPHAGOGASTRODUODENOSCOPY N/A 10/10/2013   Procedure: ESOPHAGOGASTRODUODENOSCOPY (EGD);  Surgeon: Hart Carwinora M Brodie, MD;  [facility];  Service: Endoscopy;  Laterality:  N/A;  pt asthmatic, RA 92% at rest.  Coughing frequently. Wheezes noted in upper lung fields at auscultation. Pt instructed to use home inhaler prior to procedure and placed on supportive O2 @ 2 lpm in pre-procedural. Teaching done ie. use of inhaler. Pt states she ju  . NISSEN FUNDOPLICATION    . ROTATOR CUFF REPAIR     x3(2 on L, 1 on R)  . VIDEO BRONCHOSCOPY WITH INSERTION OF INTERBRONCHIAL VALVE (IBV) N/A 05/08/2017   Procedure: VIDEO BRONCHOSCOPY WITH INSERTION OF INTERBRONCHIAL VALVE (IBV);  Surgeon: Loreli Slot, MD;  [facility] OR;  Service: Thoracic;  Laterality: N/A;  . VIDEO BRONCHOSCOPY WITH INSERTION OF  INTERBRONCHIAL VALVE (IBV) N/A 07/10/2017   Procedure: VIDEO BRONCHOSCOPY FOR REMOVAL OF ENDOSCOPIC BRONCHIAL VALVE;  Surgeon: [name], MD;  Location: [facility] OR;  Service: Thoracic;  Laterality: N/A;    Social History   Tobacco Use  . Smoking status: Former Smoker    Packs/day: 1.00    Years: 20.00    Pack years: 20.00    Types: Cigarettes    Last attempt to quit: 09/08/1978    Years since quitting: 40.3  . Smokeless tobacco: Never Used  Substance Use Topics  . Alcohol use: No  . Drug use: No    Family History  Adopted: Yes    Allergies  Allergen Reactions  . Gabapentin Swelling    EDEMA WITH POSITIVE RECHALLENGE  . Lactose Intolerance (Gi) Diarrhea and Other (See Comments)    Stomach cramps  . Pineapple Hives and Itching  . Penicillins Other (See Comments)    Unknown - childhood allergy Has patient had a PCN reaction causing immediate rash, facial/tongue/throat swelling, SOB or lightheadedness with hypotension: Unknown Has patient had a PCN reaction causing severe rash involving mucus membranes or skin necrosis: Unknown Has patient had a PCN reaction that required hospitalization: Unknown Has patient had a PCN reaction occurring within the last 10 years: No If all of the above answers are "NO", then may proceed with Cephalosporin use.  . Aspirin Other (See Comments)    gastritis    Medication list has been reviewed and updated.  Current Outpatient Medications on File Prior to Visit  Medication Sig Dispense Refill  . ALOE VERA JUICE PO Take 120 mLs by mouth daily.     . Ascorbic Acid (VITAMIN C PO) Take 1 tablet by mouth daily. 1000 MG    . ASTRAGALUS PO Take 1 tablet by mouth daily.     . budesonide-formoterol (SYMBICORT) 160-4.5 MCG/ACT inhaler Inhale 2 puffs into the lungs 2 (two) times daily. 1 Inhaler 11  . Calcium Carbonate-Vitamin D (CALCIUM-D PO) Take 1 tablet by mouth daily.    . Collagen Hydrolysate POWD Take 1 scoop by mouth daily with supper.     .  Cyanocobalamin (VITAMIN B-12) 5000 MCG TBDP Take 5,000 mcg by mouth daily.    . cycloSPORINE (RESTASIS) 0.05 % ophthalmic emulsion Place 1 drop into both eyes 2 (two) times daily.     . furosemide (LASIX) 20 MG tablet TAKE 1 TABLET (20 MG TOTAL) BY MOUTH DAILY. USE AS NEEDED FOR SWELLING (Patient taking differently: Take 20 mg by mouth daily as needed (swelling). ) 90 tablet 1  . guaiFENesin (MUCINEX) 600 MG 12 hr tablet Take 1 tablet by mouth daily.     Marland Kitchen ipratropium (ATROVENT HFA) 17 MCG/ACT inhaler Inhale 2 puffs into the lungs every 6 (six) hours as needed for wheezing. 1 Inhaler 1  . loratadine (  CLARITIN) 10 MG tablet Take 10 mg by mouth daily.    . Multiple Vitamin (MULTIVITAMIN WITH MINERALS) TABS tablet Take 1 tablet by mouth daily.    . Nasal Moisturizer Combination (RHINASE) SOLN Place 2 sprays into the nose 2 (two) times daily as needed (dry, bloody nostrils).     . Omega 3-6-9 Fatty Acids (OMEGA 3-6-9 COMPLEX PO) Take 1 capsule by mouth daily.    . ondansetron (ZOFRAN ODT) 4 MG disintegrating tablet Take 1 tablet (4 mg total) by mouth every 8 (eight) hours as needed for nausea or vomiting. 20 tablet 0  . OXYGEN Inhale 2-4 L into the lungs continuous. If moving around it is increased    . pantoprazole (PROTONIX) 20 MG tablet TAKE 1 TABLET (20 MG TOTAL) BY MOUTH DAILY BEFORE BREAKFAST 90 tablet 1  . phenylephrine (SUDAFED PE) 10 MG TABS tablet Take 10 mg by mouth daily.    . polyvinyl alcohol (ARTIFICIAL TEARS) 1.4 % ophthalmic solution Place 1 drop into both eyes See admin instructions. Instill 1 drop in to both eyes once or twice daily as needed for dry eyes - in between restasis doses    . PREBIOTIC PRODUCT PO Take 1 scoop by mouth daily before breakfast.     . predniSONE (DELTASONE) 10 MG tablet 4 tab po day 1, 3 tab po day 2, 2 tab po day 3, 1 tab po day 4 10 tablet 0  . Respiratory Therapy Supplies (FLUTTER) DEVI 1 application by Does not apply route 2 (two) times daily. 1 each 0  .  Tiotropium Bromide Monohydrate (SPIRIVA RESPIMAT) 2.5 MCG/ACT AERS Inhale 2 puffs into the lungs daily. 1 Inhaler 5  . traMADol (ULTRAM) 50 MG tablet 1 tab po q 6 hours as needed severe ha 6 tablet 0  . valACYclovir (VALTREX) 1000 MG tablet Take 1 tablet (1,000 mg total) by mouth 3 (three) times daily. 21 tablet 0  . VENTOLIN HFA 108 (90 Base) MCG/ACT inhaler INHALE 2 PUFFS INTO THE LUNGS EVERY 4 (FOUR) HOURS AS NEEDED FOR WHEEZING OR SHORTNESS OF BREATH. 18 Inhaler 3   No current facility-administered medications on file prior to visit.     Review of Systems:  No fever  Physical Examination: There were no vitals filed for this visit. There were no vitals filed for this visit. There is no height or weight on file to calculate BMI. Ideal Body Weight:    Spoke with patient on phone, she says her normal self, no cough or tachypnea is noted  Assessment and Plan: Herpes zoster without complication - Plan: HYDROcodone-acetaminophen (NORCO/VICODIN) 5-325 MG tablet  Shingles outbreak on face.  We started Valtrex yesterday, but were called in today due to discomfort.  I called in prescription for hydrocodone for her.  Advised that she may take up to max dose of 2 pills every 6 hours, but would suggest that she start on a much lower dose.  Perhaps 1 every 8 hours or so  She will let me know if anything else is needed, if she is not getting better or if she is getting worse Spoke with patient on the phone for about 6 minutes  Meds ordered this encounter  Medications  . HYDROcodone-acetaminophen (NORCO/VICODIN) 5-325 MG tablet    Sig: Take 1-2 tablets by mouth every 6 (six) hours as needed for up to 5 days.    Dispense:  30 tablet    Refill:  0     Signed Abbe Amsterdam, MD

## 2019-01-15 ENCOUNTER — Other Ambulatory Visit: Payer: Self-pay | Admitting: Pulmonary Disease

## 2019-01-23 DIAGNOSIS — D869 Sarcoidosis, unspecified: Secondary | ICD-10-CM | POA: Diagnosis not present

## 2019-01-23 DIAGNOSIS — J45909 Unspecified asthma, uncomplicated: Secondary | ICD-10-CM | POA: Diagnosis not present

## 2019-02-01 ENCOUNTER — Emergency Department (HOSPITAL_COMMUNITY)
Admission: EM | Admit: 2019-02-01 | Discharge: 2019-02-01 | Disposition: A | Discharge status: Home / Self Care | Payer: Medicare Other | Attending: Emergency Medicine | Admitting: Emergency Medicine

## 2019-02-01 ENCOUNTER — Emergency Department (HOSPITAL_COMMUNITY): Payer: Medicare Other

## 2019-02-01 ENCOUNTER — Telehealth: Payer: Self-pay | Admitting: Family Medicine

## 2019-02-01 ENCOUNTER — Other Ambulatory Visit: Payer: Self-pay

## 2019-02-01 DIAGNOSIS — Z20828 Contact with and (suspected) exposure to other viral communicable diseases: Secondary | ICD-10-CM | POA: Diagnosis not present

## 2019-02-01 DIAGNOSIS — R509 Fever, unspecified: Secondary | ICD-10-CM | POA: Diagnosis not present

## 2019-02-01 DIAGNOSIS — J45909 Unspecified asthma, uncomplicated: Secondary | ICD-10-CM | POA: Diagnosis not present

## 2019-02-01 DIAGNOSIS — B028 Zoster with other complications: Secondary | ICD-10-CM

## 2019-02-01 DIAGNOSIS — R0602 Shortness of breath: Secondary | ICD-10-CM | POA: Diagnosis not present

## 2019-02-01 DIAGNOSIS — Z87891 Personal history of nicotine dependence: Secondary | ICD-10-CM | POA: Insufficient documentation

## 2019-02-01 DIAGNOSIS — Z79899 Other long term (current) drug therapy: Secondary | ICD-10-CM | POA: Diagnosis not present

## 2019-02-01 DIAGNOSIS — B020 Zoster encephalitis: Secondary | ICD-10-CM | POA: Diagnosis not present

## 2019-02-01 LAB — CBC WITH DIFFERENTIAL/PLATELET
Abs Immature Granulocytes: 0 10*3/uL (ref 0.00–0.07)
Basophils Absolute: 0.1 10*3/uL (ref 0.0–0.1)
Basophils Relative: 1 %
Eosinophils Absolute: 0.4 10*3/uL (ref 0.0–0.5)
Eosinophils Relative: 6 %
HCT: 38.3 % (ref 36.0–46.0)
Hemoglobin: 12.6 g/dL (ref 12.0–15.0)
Lymphocytes Relative: 37 %
Lymphs Abs: 2.5 10*3/uL (ref 0.7–4.0)
MCH: 30.6 pg (ref 26.0–34.0)
MCHC: 32.9 g/dL (ref 30.0–36.0)
MCV: 93 fL (ref 80.0–100.0)
Monocytes Absolute: 0.7 10*3/uL (ref 0.1–1.0)
Monocytes Relative: 11 %
Neutro Abs: 3.1 10*3/uL (ref 1.7–7.7)
Neutrophils Relative %: 45 %
Platelets: 268 10*3/uL (ref 150–400)
RBC: 4.12 MIL/uL (ref 3.87–5.11)
RDW: 12.6 % (ref 11.5–15.5)
WBC: 6.8 10*3/uL (ref 4.0–10.5)
nRBC: 0 % (ref 0.0–0.2)
nRBC: 0 /100 WBC

## 2019-02-01 LAB — BASIC METABOLIC PANEL
Anion gap: 8 (ref 5–15)
BUN: 12 mg/dL (ref 8–23)
CO2: 30 mmol/L (ref 22–32)
Calcium: 9.9 mg/dL (ref 8.9–10.3)
Chloride: 99 mmol/L (ref 98–111)
Creatinine, Ser: 1.13 mg/dL — ABNORMAL HIGH (ref 0.44–1.00)
GFR calc Af Amer: 57 mL/min — ABNORMAL LOW (ref >60)
GFR calc non Af Amer: 49 mL/min — ABNORMAL LOW (ref >60)
Glucose, Bld: 103 mg/dL — ABNORMAL HIGH (ref 70–99)
Potassium: 3.8 mmol/L (ref 3.5–5.1)
Sodium: 137 mmol/L (ref 135–145)

## 2019-02-01 LAB — SARS CORONAVIRUS 2 BY RT PCR (HOSPITAL ORDER, PERFORMED IN ~~LOC~~ HOSPITAL LAB): SARS Coronavirus 2: NEGATIVE

## 2019-02-01 MED ORDER — PREDNISONE 10 MG PO TABS: ORAL_TABLET | ORAL | 0 refills | Status: AC

## 2019-02-01 MED ORDER — FLUORESCEIN SODIUM 1 MG OP STRP
1.0000 | ORAL_STRIP | Freq: Once | OPHTHALMIC | Status: DC
Start: 2019-02-01 — End: 2019-02-02
  Filled 2019-02-01: qty 1

## 2019-02-01 MED ORDER — OXYCODONE-ACETAMINOPHEN 5-325 MG PO TABS: 1.0000 | ORAL_TABLET | Freq: Four times a day (QID) | ORAL | 0 refills | Status: DC | PRN

## 2019-02-01 MED ORDER — OXYCODONE-ACETAMINOPHEN 5-325 MG PO TABS
1.0000 | ORAL_TABLET | Freq: Once | ORAL | Status: AC
  Administered 2019-02-01: 22:00:00 1 via ORAL
  Filled 2019-02-01: qty 1

## 2019-02-01 NOTE — Discharge Instructions (Signed)
I do not see any evidence of Ramsay Hunt syndrome at this time, however I suspect you may be developing some post herpetic neuralgia.  Take prednisone taper as prescribed until completed.  Take Percocet as prescribed every 6 hours as needed for pain.  Please follow-up with your doctor for recheck in the next few days.  Please return to the emergency department if you develop any new or worsening symptoms.  Do not drink alcohol, drive, operate machinery or participate in any other potentially dangerous activities while taking opiate pain medication as it may make you sleepy. Do not take this medication with any other sedating medications, either prescription or over-the-counter. If you were prescribed Percocet or Vicodin, do not take these with acetaminophen (Tylenol) as it is already contained within these medications and overdose of Tylenol is dangerous.   This medication is an opiate (or narcotic) pain medication and can be habit forming.  Use it as little as possible to achieve adequate pain control.  Do not use or use it with extreme caution if you have a history of opiate abuse or dependence. This medication is intended for your use only - do not give any to anyone else and keep it in a secure place where nobody else, especially children, have access to it. It will also cause or worsen constipation, so you may want to consider taking an over-the-counter stool softener while you are taking this medication.

## 2019-02-01 NOTE — Telephone Encounter (Signed)
Pt had virtual visit on 01/13/2019.

## 2019-02-01 NOTE — Telephone Encounter (Signed)
I called her back- she is still having a lot of discomfort, still in the same distribution- her upper right neck and into her ear- ?Ramsye Hunt I was going to bring her in but she expresses low grade temp to 100 so I am not permitted to do so I have advised her to go to the ER for eval and she agrees

## 2019-02-01 NOTE — Telephone Encounter (Signed)
Copied from CRM [id number]. Topic: General - Inquiry >> Feb 01, 2019  9:49 AM Deborha Payment wrote: Reason for CRM: Patient is calling stating she upper right side pain with her shingles.  It is going down to her chest to neck to right ear. Some places in her neck she cannot feel. She is expressing her body is on fire.  Patient is also requesting COVID, sent patient to health department regarding that issues.  Patient would like PCP nurse to give a call regarding her shingles. Patient call back # [phone]

## 2019-02-01 NOTE — ED Triage Notes (Signed)
Pt reports she has had shingles and her rash has gone away but she now has right ear pain and is having difficulty turning her head to the right. Pt reports her pcp sent her here to rule out Ramsye Hunt. Pt reports that she wears 2L of oxygen at home and 4L with movement. Pt reports she is more short of breath than normal. Pt reports a cough but states that is normal for her. Pt is alert and oriented x4. Pt reports some low grade temps of 100.0 at home but states she thinks it is from the shingles.

## 2019-02-01 NOTE — ED Provider Notes (Signed)
[facility] EMERGENCY DEPARTMENT Provider Note   CSN: [id number] Arrival date & time: 02/01/19  1821    History   Chief Complaint Chief Complaint  Patient presents with  . Herpes Zoster  . Shortness of Breath    HPI [name] is a 68 y.o. female with history of pulmonary sarcoidosis on 2 L of oxygen at baseline who presents with a several week history of shingles to the right neck and face.  Patient has already taken Valtrex and a course of prednisone.  She has finished Norco.  She reports she has had neck pain and difficulty moving her neck to the right since it began.  She has been having pain in her ear for the past week.  Her doctor was concerned she may have Ramsay Hunt syndrome.  Patient denies any facial paralysis or hearing loss.  Regarding patient's shortness of breath, she reports it is no worse than normal.  She normally has to increase her oxygen on exertion, which she did at triage.  She states this is normal.  Patient denies any chest pain, abdominal pain, nausea, vomiting.  She reports she has noticed her temperature has been elevated, however has not gone over 100.     HPI  Past Medical History:  Diagnosis Date  . Anemia   . Anxiety   . Asthma   . Barrett esophagus   . Depression   . GERD (gastroesophageal reflux disease)   . Hiatal hernia   . IBS (irritable bowel syndrome)   . Migraines   . Pneumothorax   . Pulmonary sarcoidosis (HCC)   . Supplemental oxygen dependent   . Wears dentures     Patient Active Problem List   Diagnosis Date Noted  . Other chest pain   . Spontaneous pneumothorax 04/18/2017  . SOB (shortness of breath) 06/05/2016  . Osteopenia 02/12/2016  . Lumbar radiculopathy 01/26/2015  . Chronic respiratory failure with hypoxia (HCC) 04/12/2014  . Obesity (BMI 30-39.9) 02/08/2014  . Allergic rhinitis 09/05/2010  . IBS 09/05/2010  . GERD 02/18/2010  . Barrett's esophagus 02/12/2010  . Upper airway cough  syndrome 11/02/2009  . Anxiety state 08/29/2008  . History of colonic polyps 08/29/2008  . PULMONARY SARCOIDOSIS 08/12/2007  . Mild persistent asthma in adult without complication with component of vcd 08/12/2007  . Diaphragmatic hernia 08/12/2007  . Migraine 08/12/2007    Past Surgical History:  Procedure Laterality Date  . APPENDECTOMY    . BRAVO PH STUDY N/A 10/10/2013   Procedure: BRAVO PH STUDY;  Surgeon: Hart Carwin, MD;  Location: [facility] ENDOSCOPY;  Service: Endoscopy;  Laterality: N/A;  placed 27  . BRONCHIAL BIOPSY  1996  . CATARACT EXTRACTION, BILATERAL    . CHEST TUBE INSERTION Right 04/23/2017   Procedure: CHEST TUBE INSERTION;  Surgeon: [name], MD;  Location: [facility];  Service: Thoracic;  Laterality: Right;  . CHOLECYSTECTOMY    . ESOPHAGOGASTRODUODENOSCOPY N/A 10/10/2013   Procedure: ESOPHAGOGASTRODUODENOSCOPY (EGD);  Surgeon: Hart Carwin, MD;  Location: [facility];  Service: Endoscopy;  Laterality: N/A;  pt asthmatic, RA 92% at rest.  Coughing frequently. Wheezes noted in upper lung fields at auscultation. Pt instructed to use home inhaler prior to procedure and placed on supportive O2 @ 2 lpm in pre-procedural. Teaching done ie. use of inhaler. Pt states she ju  . NISSEN FUNDOPLICATION    . ROTATOR CUFF REPAIR     x3(2 on L, 1 on R)  . VIDEO BRONCHOSCOPY  WITH INSERTION OF INTERBRONCHIAL VALVE (IBV) N/A 05/08/2017   Procedure: VIDEO BRONCHOSCOPY WITH INSERTION OF INTERBRONCHIAL VALVE (IBV);  Surgeon: Loreli Slot, MD;  Location: [facility] OR;  Service: Thoracic;  Laterality: N/A;  . VIDEO BRONCHOSCOPY WITH INSERTION OF INTERBRONCHIAL VALVE (IBV) N/A 07/10/2017   Procedure: VIDEO BRONCHOSCOPY FOR REMOVAL OF ENDOSCOPIC BRONCHIAL VALVE;  Surgeon: [name], MD;  Location: [facility] OR;  Service: Thoracic;  Laterality: N/A;     OB History   No obstetric history on file.      Home Medications    Prior to Admission medications   Medication Sig Start Date End Date  Taking? Authorizing Provider  ALOE VERA JUICE PO Take 120 mLs by mouth daily.     [provider]  Ascorbic Acid (VITAMIN C PO) Take 1 tablet by mouth daily. 1000 MG    [provider]  ASTRAGALUS PO Take 1 tablet by mouth daily.     [provider]  budesonide-formoterol (SYMBICORT) 160-4.5 MCG/ACT inhaler Inhale 2 puffs into the lungs 2 (two) times daily. 06/18/18   Coralyn Helling, MD  Calcium Carbonate-Vitamin D (CALCIUM-D PO) Take 1 tablet by mouth daily.    [provider]  Collagen Hydrolysate POWD Take 1 scoop by mouth daily with supper.     [provider]  Cyanocobalamin (VITAMIN B-12) 5000 MCG TBDP Take 5,000 mcg by mouth daily.    [provider]  cycloSPORINE (RESTASIS) 0.05 % ophthalmic emulsion Place 1 drop into both eyes 2 (two) times daily.     [provider]  furosemide (LASIX) 20 MG tablet TAKE 1 TABLET (20 MG TOTAL) BY MOUTH DAILY. USE AS NEEDED FOR SWELLING Patient taking differently: Take 20 mg by mouth daily as needed (swelling).  09/18/16   Copland, Gwenlyn Found, MD  guaiFENesin (MUCINEX) 600 MG 12 hr tablet Take 1 tablet by mouth daily.     [provider]  ipratropium (ATROVENT HFA) 17 MCG/ACT inhaler Inhale 2 puffs into the lungs every 6 (six) hours as needed for wheezing. 06/27/16   Saguier, Ramon Dredge, PA-C  loratadine (CLARITIN) 10 MG tablet Take 10 mg by mouth daily.    [provider]  Multiple Vitamin (MULTIVITAMIN WITH MINERALS) TABS tablet Take 1 tablet by mouth daily.    [provider]  Nasal Moisturizer Combination (RHINASE) SOLN Place 2 sprays into the nose 2 (two) times daily as needed (dry, bloody nostrils).     [provider]  Omega 3-6-9 Fatty Acids (OMEGA 3-6-9 COMPLEX PO) Take 1 capsule by mouth daily.    [provider]  ondansetron (ZOFRAN ODT) 4 MG disintegrating tablet Take 1 tablet (4 mg total) by mouth every 8 (eight) hours as needed for nausea or  vomiting. 12/24/18   Saguier, Ramon Dredge, PA-C  oxyCODONE-acetaminophen (PERCOCET/ROXICET) 5-325 MG tablet Take 1 tablet by mouth every 6 (six) hours as needed for severe pain. 02/01/19   Bettylee Feig, Waylan Boga, PA-C  OXYGEN Inhale 2-4 L into the lungs continuous. If moving around it is increased    [provider]  pantoprazole (PROTONIX) 20 MG tablet TAKE 1 TABLET (20 MG TOTAL) BY MOUTH DAILY BEFORE BREAKFAST 07/23/18   Armbruster, Willaim Rayas, MD  phenylephrine (SUDAFED PE) 10 MG TABS tablet Take 10 mg by mouth daily.    [provider]  polyvinyl alcohol (ARTIFICIAL TEARS) 1.4 % ophthalmic solution Place 1 drop into both eyes See admin instructions. Instill 1 drop in to both eyes once or twice daily as needed  for dry eyes - in between restasis doses    [provider]  PREBIOTIC PRODUCT PO Take 1 scoop by mouth daily before breakfast.     [provider]  predniSONE (DELTASONE) 10 MG tablet Take 5 tablets (50 mg total) by mouth daily for 2 days, THEN 4 tablets (40 mg total) daily for 2 days, THEN 3 tablets (30 mg total) daily for 2 days, THEN 2 tablets (20 mg total) daily for 2 days, THEN 1 tablet (10 mg total) daily for 2 days. 02/01/19 02/11/19  Emi Holes, PA-C  Respiratory Therapy Supplies (FLUTTER) DEVI 1 application by Does not apply route 2 (two) times daily. 06/16/18   Coralyn Helling, MD  SPIRIVA RESPIMAT 2.5 MCG/ACT AERS INHALE 2 PUFFS BY MOUTH INTO THE LUNGS DAILY 01/17/19   Coralyn Helling, MD  valACYclovir (VALTREX) 1000 MG tablet Take 1 tablet (1,000 mg total) by mouth 3 (three) times daily. 01/12/19   Copland, Gwenlyn Found, MD  VENTOLIN HFA 108 (90 Base) MCG/ACT inhaler INHALE 2 PUFFS INTO THE LUNGS EVERY 4 (FOUR) HOURS AS NEEDED FOR WHEEZING OR SHORTNESS OF BREATH. 12/22/18   Copland, Gwenlyn Found, MD    Family History Family History  Adopted: Yes    Social History Social History   Tobacco Use  . Smoking status: Former Smoker    Packs/day: 1.00    Years: 20.00     Pack years: 20.00    Types: Cigarettes    Last attempt to quit: 09/08/1978    Years since quitting: 40.4  . Smokeless tobacco: Never Used  Substance Use Topics  . Alcohol use: No  . Drug use: No     Allergies   Gabapentin; Lactose intolerance (gi); Pineapple; Penicillins; and Aspirin   Review of Systems Review of Systems  Constitutional: Positive for fever. Negative for chills.  HENT: Positive for ear pain. Negative for facial swelling and sore throat.   Eyes: Negative for visual disturbance.  Respiratory: Negative for shortness of breath.   Cardiovascular: Negative for chest pain.  Gastrointestinal: Negative for abdominal pain, nausea and vomiting.  Genitourinary: Negative for dysuria.  Musculoskeletal: Positive for neck pain. Negative for back pain.  Skin: Positive for rash. Negative for wound.  Neurological: Negative for headaches.  Psychiatric/Behavioral: The patient is not nervous/anxious.      Physical Exam Updated Vital Signs BP 125/89 (BP Location: Right Arm)   Pulse (!) 110   Temp 98.9 F (37.2 C) (Oral)   Resp 18   Ht 5\' 5"  (1.651 m)   Wt 95.3 kg   SpO2 98%   BMI 34.95 kg/m   Physical Exam Vitals signs and nursing note reviewed.  Constitutional:      General: She is not in acute distress.    Appearance: She is well-developed. She is not diaphoretic.  HENT:     Head: Normocephalic and atraumatic.     Ears:     Comments: There are no abnormalities noted to bilateral TMs or auditory canal; no vesicles noted    Mouth/Throat:     Pharynx: No oropharyngeal exudate.  Eyes:     General: No scleral icterus.       Right eye: No discharge.        Left eye: No discharge.     Extraocular Movements: Extraocular movements intact.     Conjunctiva/sclera: Conjunctivae normal.     Pupils: Pupils are equal, round, and reactive to light.     Comments: No fluorescein uptake in the right eye  on the affected side; specifically no dendritic lesions  Neck:      Musculoskeletal: Normal range of motion and neck supple.     Thyroid: No thyromegaly.     Comments: Patient has some pain and difficulty moving her head to the right, however she feels like this may be the skin in the lesions; no rigidity noted; chin to chest without significant difficulty, mild pain Cardiovascular:     Rate and Rhythm: Normal rate and regular rhythm.     Heart sounds: Normal heart sounds. No murmur. No friction rub. No gallop.   Pulmonary:     Effort: Pulmonary effort is normal. No respiratory distress.     Breath sounds: Normal breath sounds. No stridor. No wheezing or rales.  Abdominal:     General: Bowel sounds are normal. There is no distension.     Palpations: Abdomen is soft.     Tenderness: There is no abdominal tenderness. There is no guarding or rebound.  Lymphadenopathy:     Cervical: No cervical adenopathy.  Skin:    General: Skin is warm and dry.     Coloration: Skin is not pale.     Findings: No rash.  Neurological:     Mental Status: She is alert.     Coordination: Coordination normal.     Comments: Sensation intact to the face; smile is symmetrical; no facial paralysis noted      ED Treatments / Results  Labs (all labs ordered are listed, but only abnormal results are displayed) Labs Reviewed  BASIC METABOLIC PANEL - Abnormal; Notable for the following components:      Result Value   Glucose, Bld 103 (*)    Creatinine, Ser 1.13 (*)    GFR calc non Af Amer 49 (*)    GFR calc Af Amer 57 (*)    All other components within normal limits  SARS CORONAVIRUS 2 (HOSPITAL ORDER, PERFORMED IN [facility])  CBC WITH DIFFERENTIAL/PLATELET    EKG EKG Interpretation  Date/Time:  Tuesday Feb 01 2019 18:27:24 EDT Ventricular Rate:  123 PR Interval:  176 QRS Duration: 86 QT Interval:  292 QTC Calculation: 418 R Axis:   80 Text Interpretation:  Sinus tachycardia Otherwise normal ECG similar to prior 1/19 Confirmed by Meridee ScoreButler, Michael  [phone]([id number]) on 02/01/2019 6:34:31 PM   Radiology Dg Chest Portable 1 View  Result Date: 02/01/2019 CLINICAL DATA:  68 year old female with history of shortness of breath. Persistent fever. Shingles. EXAM: PORTABLE CHEST 1 VIEW COMPARISON:  Chest x-ray 09/20/2017. FINDINGS: Lung volumes are low. Diffuse interstitial prominence again noted throughout the lungs bilaterally, similar to the prior examination. No definite consolidative airspace disease. No pleural effusions. No evidence of pulmonary edema. Heart size is normal. Upper mediastinal contours are within normal limits. IMPRESSION: 1. Radiographic appearance the chest appears very similar to prior examinations, most compatible with sequela of sarcoidosis, as demonstrated on multiple prior examinations. Electronically Signed   By: Trudie Reedaniel  Entrikin M.D.   On: 02/01/2019 21:25    Procedures Procedures (including critical care time)  Medications Ordered in ED Medications  fluorescein ophthalmic strip 1 strip (has no administration in time range)  oxyCODONE-acetaminophen (PERCOCET/ROXICET) 5-325 MG per tablet 1 tablet (1 tablet Oral Given 02/01/19 2152)     Initial Impression / Assessment and Plan / ED Course  I have reviewed the triage vital signs and the nursing notes.  Pertinent labs & imaging results that were available during my care of the patient were reviewed  by me and considered in my medical decision making (see chart for details).        Patient with ongoing pain from herpes zoster.  There is no evidence of Ramsay Hunt syndrome, however patient has already completed prednisone and Valtrex.  Labs are unremarkable including COVID 19 test.  Chest x-ray is stable for the patient.  Will discharge home with another taper of prednisone as well as Percocet, as Norco was not helping with the pain is much.  Patient also may be developing a postherpetic neuralgia.  Will defer to PCP for this.  Unfortunately, patient has an allergy to gabapentin.   Strict return precautions given.  Patient understands and agrees with plan.  Patient vital stable throughout ED course and discharged in satisfactory condition.  Patient also evaluated by my attending, Dr. Charm Barges, who guided the patient's management and agrees with plan.  Final Clinical Impressions(s) / ED Diagnoses   Final diagnoses:  Herpes zoster with complication    ED Discharge Orders         Ordered    predniSONE (DELTASONE) 10 MG tablet     02/01/19 2201    oxyCODONE-acetaminophen (PERCOCET/ROXICET) 5-325 MG tablet  Every 6 hours PRN     02/01/19 2201           Emi Holes, PA-C 02/01/19 2236    Terrilee Files, MD 02/02/19 [phone]

## 2019-02-05 NOTE — Progress Notes (Signed)
[facility] [address] [place], [place] [address] [phone] [id number]  Date:  02/07/2019   Name:  [name]   DOB:  05/14/1951   MRN:  [id number]  PCP:  Pearline Cables, MD    Chief Complaint: No chief complaint on file.   History of Present Illness:  [name] is a 68 y.o. very pleasant female patient who presents with the following:  Virtual visit today for ER follow-up Patient location is home Provider location is office Patient identity confirmed with 2 factors, she gives consent for virtual visit today  Onisha has history of pulmonary sarcoidosis, asthma, and chronic shortness of breath.  She is on chronic oxygen with nasal cannula  We did a virtual visit earlier in May due to apparent shingles. On May 7 she had concern of possible shingles, we started her on Valtrex. She contacted me again on May 26, with worsening symptoms involving her ear area.  I referred her to the ER as I was concerned she might have Ramsay Hunt syndrome She was evaluated in the ER, negative chest x-ray, negative COVID test.  They did not feel that she has Ramsay Hunt.  She was given Percocet for pain.  She is unfortunately not able to use gabapentin due to history of allergy.  She notes that she is "still about the same," she continues to have itching and burning on the side of her head, involving the scalp and ear area. The rash seems to be gone except for just around her ear She is using cold towels as a compress which helps for a little while  She is miserable and notes that percocet does not seem to help We discussed other management strategies Of note she has history of possible severe allergic reaction to gabapentin  We discussed other options, will have her try amitriptyline We also talked about using capsaicin cream, she will be careful to avoid the eye area    Patient Active Problem List   Diagnosis Date Noted   . Other chest pain   . Spontaneous pneumothorax 04/18/2017  . SOB (shortness of breath) 06/05/2016  . Osteopenia 02/12/2016  . Lumbar radiculopathy 01/26/2015  . Chronic respiratory failure with hypoxia (HCC) 04/12/2014  . Obesity (BMI 30-39.9) 02/08/2014  . Allergic rhinitis 09/05/2010  . IBS 09/05/2010  . GERD 02/18/2010  . Barrett's esophagus 02/12/2010  . Upper airway cough syndrome 11/02/2009  . Anxiety state 08/29/2008  . History of colonic polyps 08/29/2008  . PULMONARY SARCOIDOSIS 08/12/2007  . Mild persistent asthma in adult without complication with component of vcd 08/12/2007  . Diaphragmatic hernia 08/12/2007  . Migraine 08/12/2007    Past Medical History:  Diagnosis Date  . Anemia   . Anxiety   . Asthma   . Barrett esophagus   . Depression   . GERD (gastroesophageal reflux disease)   . Hiatal hernia   . IBS (irritable bowel syndrome)   . Migraines   . Pneumothorax   . Pulmonary sarcoidosis (HCC)   . Supplemental oxygen dependent   . Wears dentures     Past Surgical History:  Procedure Laterality Date  . APPENDECTOMY    . BRAVO PH STUDY N/A 10/10/2013   Procedure: BRAVO PH STUDY;  Surgeon: Hart Carwin, MD;  Location: [facility] ENDOSCOPY;  Service: Endoscopy;  Laterality: N/A;  placed 27  . BRONCHIAL BIOPSY  1996  . CATARACT EXTRACTION, BILATERAL    .  CHEST TUBE INSERTION Right 04/23/2017   Procedure: CHEST TUBE INSERTION;  Surgeon: [name], MD;  Location: [facility] OR;  Service: Thoracic;  Laterality: Right;  . CHOLECYSTECTOMY    . ESOPHAGOGASTRODUODENOSCOPY N/A 10/10/2013   Procedure: ESOPHAGOGASTRODUODENOSCOPY (EGD);  Surgeon: Hart Carwin, MD;  Location: [facility];  Service: Endoscopy;  Laterality: N/A;  pt asthmatic, RA 92% at rest.  Coughing frequently. Wheezes noted in upper lung fields at auscultation. Pt instructed to use home inhaler prior to procedure and placed on supportive O2 @ 2 lpm in pre-procedural. Teaching done ie. use of inhaler. Pt states  she ju  . NISSEN FUNDOPLICATION    . ROTATOR CUFF REPAIR     x3(2 on L, 1 on R)  . VIDEO BRONCHOSCOPY WITH INSERTION OF INTERBRONCHIAL VALVE (IBV) N/A 05/08/2017   Procedure: VIDEO BRONCHOSCOPY WITH INSERTION OF INTERBRONCHIAL VALVE (IBV);  Surgeon: Loreli Slot, MD;  Location: [facility] OR;  Service: Thoracic;  Laterality: N/A;  . VIDEO BRONCHOSCOPY WITH INSERTION OF INTERBRONCHIAL VALVE (IBV) N/A 07/10/2017   Procedure: VIDEO BRONCHOSCOPY FOR REMOVAL OF ENDOSCOPIC BRONCHIAL VALVE;  Surgeon: [name], MD;  Location: [facility] OR;  Service: Thoracic;  Laterality: N/A;     Social History   Tobacco Use  . Smoking status: Former Smoker    Packs/day: 1.00    Years: 20.00    Pack years: 20.00    Types: Cigarettes    Last attempt to quit: 09/08/1978    Years since quitting: 40.4  . Smokeless tobacco: Never Used  Substance Use Topics  . Alcohol use: No  . Drug use: No    Family History  Adopted: Yes    Allergies  Allergen Reactions  . Gabapentin Swelling    EDEMA WITH POSITIVE RECHALLENGE  . Lactose Intolerance (Gi) Diarrhea and Other (See Comments)    Stomach cramps  . Pineapple Hives and Itching  . Penicillins Other (See Comments)    Unknown - childhood allergy Has patient had a PCN reaction causing immediate rash, facial/tongue/throat swelling, SOB or lightheadedness with hypotension: Unknown Has patient had a PCN reaction causing severe rash involving mucus membranes or skin necrosis: Unknown Has patient had a PCN reaction that required hospitalization: Unknown Has patient had a PCN reaction occurring within the last 10 years: No If all of the above answers are "NO", then may proceed with Cephalosporin use.  . Aspirin Other (See Comments)    gastritis    Medication list has been reviewed and updated.  Current Outpatient Medications on File Prior to Visit  Medication Sig Dispense Refill  . ALOE VERA JUICE PO Take 120 mLs by mouth daily.     . Ascorbic Acid (VITAMIN C  PO) Take 1 tablet by mouth daily. 1000 MG    . ASTRAGALUS PO Take 1 tablet by mouth daily.     . budesonide-formoterol (SYMBICORT) 160-4.5 MCG/ACT inhaler Inhale 2 puffs into the lungs 2 (two) times daily. 1 Inhaler 11  . Calcium Carbonate-Vitamin D (CALCIUM-D PO) Take 1 tablet by mouth daily.    . Collagen Hydrolysate POWD Take 1 scoop by mouth daily with supper.     . Cyanocobalamin (VITAMIN B-12) 5000 MCG TBDP Take 5,000 mcg by mouth daily.    . cycloSPORINE (RESTASIS) 0.05 % ophthalmic emulsion Place 1 drop into both eyes 2 (two) times daily.     . furosemide (LASIX) 20 MG tablet TAKE 1 TABLET (20 MG TOTAL) BY MOUTH DAILY. USE AS NEEDED FOR SWELLING (Patient taking differently: Take  20 mg by mouth daily as needed (swelling). ) 90 tablet 1  . guaiFENesin (MUCINEX) 600 MG 12 hr tablet Take 1 tablet by mouth daily.     Marland Kitchen ipratropium (ATROVENT HFA) 17 MCG/ACT inhaler Inhale 2 puffs into the lungs every 6 (six) hours as needed for wheezing. 1 Inhaler 1  . loratadine (CLARITIN) 10 MG tablet Take 10 mg by mouth daily.    . Multiple Vitamin (MULTIVITAMIN WITH MINERALS) TABS tablet Take 1 tablet by mouth daily.    . Nasal Moisturizer Combination (RHINASE) SOLN Place 2 sprays into the nose 2 (two) times daily as needed (dry, bloody nostrils).     . Omega 3-6-9 Fatty Acids (OMEGA 3-6-9 COMPLEX PO) Take 1 capsule by mouth daily.    . ondansetron (ZOFRAN ODT) 4 MG disintegrating tablet Take 1 tablet (4 mg total) by mouth every 8 (eight) hours as needed for nausea or vomiting. 20 tablet 0  . oxyCODONE-acetaminophen (PERCOCET/ROXICET) 5-325 MG tablet Take 1 tablet by mouth every 6 (six) hours as needed for severe pain. 10 tablet 0  . OXYGEN Inhale 2-4 L into the lungs continuous. If moving around it is increased    . pantoprazole (PROTONIX) 20 MG tablet TAKE 1 TABLET (20 MG TOTAL) BY MOUTH DAILY BEFORE BREAKFAST 90 tablet 1  . phenylephrine (SUDAFED PE) 10 MG TABS tablet Take 10 mg by mouth daily.    .  polyvinyl alcohol (ARTIFICIAL TEARS) 1.4 % ophthalmic solution Place 1 drop into both eyes See admin instructions. Instill 1 drop in to both eyes once or twice daily as needed for dry eyes - in between restasis doses    . PREBIOTIC PRODUCT PO Take 1 scoop by mouth daily before breakfast.     . predniSONE (DELTASONE) 10 MG tablet Take 5 tablets (50 mg total) by mouth daily for 2 days, THEN 4 tablets (40 mg total) daily for 2 days, THEN 3 tablets (30 mg total) daily for 2 days, THEN 2 tablets (20 mg total) daily for 2 days, THEN 1 tablet (10 mg total) daily for 2 days. 30 tablet 0  . Respiratory Therapy Supplies (FLUTTER) DEVI 1 application by Does not apply route 2 (two) times daily. 1 each 0  . SPIRIVA RESPIMAT 2.5 MCG/ACT AERS INHALE 2 PUFFS BY MOUTH INTO THE LUNGS DAILY 1 Inhaler 5  . valACYclovir (VALTREX) 1000 MG tablet Take 1 tablet (1,000 mg total) by mouth 3 (three) times daily. 21 tablet 0  . VENTOLIN HFA 108 (90 Base) MCG/ACT inhaler INHALE 2 PUFFS INTO THE LUNGS EVERY 4 (FOUR) HOURS AS NEEDED FOR WHEEZING OR SHORTNESS OF BREATH. 18 Inhaler 3   No current facility-administered medications on file prior to visit.     Review of Systems:  As per HPI- otherwise negative.  No fever, that she does feel hot due to shingles Physical Examination: There were no vitals filed for this visit. There were no vitals filed for this visit. There is no height or weight on file to calculate BMI. Ideal Body Weight:    Pt observed over video She looks well but is wearing a towel on her head as a cool compress  She has not measured a fever   Assessment and Plan: Post herpetic neuralgia - Plan: amitriptyline (ELAVIL) 10 MG tablet  Pedal edema - Plan: furosemide (LASIX) 20 MG tablet   We had planned to try lyrica, but I asked pharmD who did feel that there was risk of allergic cross reactivity despite lack of  warning from EMR.  Decided then to try amitriptyline 10 mg instead We will plan to  increase to 20 as needed  Pt sent the following mychart message: It was good to talk to you today!  However I am sorry that this shingles is giving you so much trouble You can have the vaccine once all your lesions are healed completely  As we discussed today, an OTC capsaicin cream may be helpful for your painful skin areas.  However do NOT apply anywhere near your eyes  I also sent in an rx for amitriptyline 10 mg to use at bedtime.  We can increase this dose as needed- please let me know how it works for you  Keep me posted and take care  JC Signed Abbe AmsterdamJessica Stace Peace, MD

## 2019-02-07 ENCOUNTER — Ambulatory Visit (INDEPENDENT_AMBULATORY_CARE_PROVIDER_SITE_OTHER): Payer: Medicare Other | Admitting: Family Medicine

## 2019-02-07 ENCOUNTER — Encounter: Payer: Self-pay | Admitting: Family Medicine

## 2019-02-07 ENCOUNTER — Other Ambulatory Visit: Payer: Self-pay

## 2019-02-07 DIAGNOSIS — R600 Localized edema: Secondary | ICD-10-CM | POA: Diagnosis not present

## 2019-02-07 DIAGNOSIS — B0229 Other postherpetic nervous system involvement: Secondary | ICD-10-CM | POA: Diagnosis not present

## 2019-02-07 MED ORDER — AMITRIPTYLINE HCL 10 MG PO TABS: 10.0000 mg | ORAL_TABLET | Freq: Every day | ORAL | 0 refills | Status: DC

## 2019-02-07 MED ORDER — FUROSEMIDE 20 MG PO TABS: 20.0000 mg | ORAL_TABLET | Freq: Every day | ORAL | 1 refills | Status: DC

## 2019-02-07 NOTE — Patient Instructions (Addendum)
It was good to talk to you today!  However I am sorry that this shingles is giving you so much trouble You can have the vaccine once all your lesions are healed completely  As we discussed today, an OTC capsaicin cream may be helpful for your painful skin areas.  However do NOT apply anywhere near your eyes  I also sent in an rx for amitriptyline 10 mg to use at bedtime.  We can increase this dose as needed- please let me know how it works for you  Keep me posted and take care  JC

## 2019-02-08 ENCOUNTER — Encounter: Payer: Self-pay | Admitting: Family Medicine

## 2019-02-08 ENCOUNTER — Telehealth: Payer: Self-pay

## 2019-02-08 NOTE — Telephone Encounter (Signed)
Copied from CRM [id number]. Topic: General - Other >> Feb 08, 2019  8:52 AM Jaquita Rector A wrote: Reason for CRM: Patient called to say to Dr Patsy Lager that she tried the Capsicin and it burn too much so she had to wipe it off and use a cool towel. She states that if there is something soothing that can be recommended please let her know. But she say thank you for the recommendation but she will never use this product again. Ph# [phone]

## 2019-02-08 NOTE — Telephone Encounter (Signed)
Left voicemail to check my chart message.

## 2019-02-08 NOTE — Telephone Encounter (Signed)
mychart message to pt  [name] if you have time you can call her and ask her to check her mychart- TY

## 2019-02-08 NOTE — Telephone Encounter (Signed)
Copied from CRM [id number]. Topic: General - Inquiry >> Feb 07, 2019  9:49 AM Crist Infante wrote: Reason for CRM: pt states she just got off tele visit with Dr Patsy Lager and forgot to write down which med she was going to prescribe.  Dr had not finished so was unable to let the pt know.  Please call pt to advise

## 2019-02-23 DIAGNOSIS — J45909 Unspecified asthma, uncomplicated: Secondary | ICD-10-CM | POA: Diagnosis not present

## 2019-02-23 DIAGNOSIS — D869 Sarcoidosis, unspecified: Secondary | ICD-10-CM | POA: Diagnosis not present

## 2019-03-25 ENCOUNTER — Other Ambulatory Visit: Payer: Self-pay | Admitting: Gastroenterology

## 2019-03-25 DIAGNOSIS — D869 Sarcoidosis, unspecified: Secondary | ICD-10-CM | POA: Diagnosis not present

## 2019-03-25 DIAGNOSIS — J45909 Unspecified asthma, uncomplicated: Secondary | ICD-10-CM | POA: Diagnosis not present

## 2019-03-25 MED ORDER — PANTOPRAZOLE SODIUM 20 MG PO TBEC: DELAYED_RELEASE_TABLET | ORAL | 1 refills | Status: DC

## 2019-03-25 NOTE — Telephone Encounter (Signed)
Patient called needing a refill °

## 2019-03-25 NOTE — Telephone Encounter (Signed)
Patient has been scheduled for follow up on 05/02/19.  Rx sent

## 2019-03-31 ENCOUNTER — Other Ambulatory Visit: Payer: Self-pay | Admitting: Family Medicine

## 2019-03-31 DIAGNOSIS — B0229 Other postherpetic nervous system involvement: Secondary | ICD-10-CM

## 2019-04-25 DIAGNOSIS — J45909 Unspecified asthma, uncomplicated: Secondary | ICD-10-CM | POA: Diagnosis not present

## 2019-04-25 DIAGNOSIS — D869 Sarcoidosis, unspecified: Secondary | ICD-10-CM | POA: Diagnosis not present

## 2019-05-02 ENCOUNTER — Ambulatory Visit: Payer: Medicare Other | Admitting: Gastroenterology

## 2019-05-26 DIAGNOSIS — J45909 Unspecified asthma, uncomplicated: Secondary | ICD-10-CM | POA: Diagnosis not present

## 2019-05-26 DIAGNOSIS — D869 Sarcoidosis, unspecified: Secondary | ICD-10-CM | POA: Diagnosis not present

## 2019-06-10 ENCOUNTER — Ambulatory Visit: Payer: Medicare Other | Admitting: Gastroenterology

## 2019-06-17 ENCOUNTER — Telehealth: Payer: Self-pay | Admitting: Family Medicine

## 2019-06-17 NOTE — Telephone Encounter (Signed)
Patient is calling back she does not want to wait until 06/30/2019 to get the Shringrix vaccine. She spoke with her insurance company- She states that she is only responsible for paying 20%. She does not wish to have the vaccine done at the pharmacy.   Patient would like to have the cost finalized before she reschedules her appt. Please advise CB- [phone]

## 2019-06-17 NOTE — Telephone Encounter (Signed)
Called patient, she is wanting shingrix vaccine at her 10/22 visit. Explained to her that medicare does not cover shingrix at office they will need it sent to pharmacy. Patient does not wish to have done at pharmacy she wants to pay OOP for injection here. Patient will approx pay 150-170 per injection. 2 dose injection so about  340.

## 2019-06-17 NOTE — Telephone Encounter (Signed)
Patient requesting 1st dose of shingles vaccination the same day she's scheduled for her flu shot on 10/22, please advise

## 2019-06-20 NOTE — Telephone Encounter (Signed)
[name] how can I know for sure the amount the patient will pay. She wants exact amount since she is paying OOP?

## 2019-06-23 NOTE — Telephone Encounter (Signed)
Patient called and stated that she had not received a call back in regards to her OOP price for shingles vaccine.  She stated that her insurance said that they will pay 20% towards administration fee.  Advised her that her insurance will not cover the injection part of it at all and that her out of pocket would possibly be 20% of about a $50 administration charge and for the vaccine it self over $240.  She stated that her insurance again covers 20% percent.  I broke it down for the patient to try to help understand that she will pay 20% for [place] to give it and the cost of the vaccine.  I asked why she did not get it done at her pharmacy and she stated that they are not giving that vaccine because of covid.  I asked when did she call and she stated that it was a couple of months ago.  While I was waiting on Anderson Malta to see if she knew about this patient, I called [facility] to ask if they were now doing shingles vaccines and they stated that they are now but a couple months ago over the summer they were not due to covid.  Spoke with Anderson Malta and she advised that it is not covered but we are not sure exactly what her total cost will be and that she would have to sign a form be getting vaccine that she will be responsible for bill.  Patient stated "you all just do not want to give my injection."  I advised her that is not the case we can give it to you but we do not want her to receive a big bill in the mail.  Also advised that we have had many of these situations where we have given the vaccine and all patients had bills and they we always refer medicare patients to the pharmacy where it will be $0 up to full cost of the vaccine and that it will be best for her to call her pharmacy to check price. She did also mention that her husband has medicare and it cost him over $200.  I advised her do she have the same insurance and she stated that they do not.  Explained to her that all insurances are different. Then we hung  up.  After hanging up with patient and felt like she was not to happy with [place], I called over to patients pharmacy([facility]) and asked if they could run a claim to see how much the shingles vaccine will cost for her.  They said it would be $45.71 for the vaccine.  I called patient back and advised her of this and she stated "yes the pharmacist said you just called."  She was very appreciative and thanked [place] for helping her out with this.

## 2019-06-25 DIAGNOSIS — D869 Sarcoidosis, unspecified: Secondary | ICD-10-CM | POA: Diagnosis not present

## 2019-06-25 DIAGNOSIS — J45909 Unspecified asthma, uncomplicated: Secondary | ICD-10-CM | POA: Diagnosis not present

## 2019-06-30 ENCOUNTER — Encounter: Payer: Self-pay | Admitting: Family Medicine

## 2019-06-30 ENCOUNTER — Inpatient Hospital Stay (HOSPITAL_BASED_OUTPATIENT_CLINIC_OR_DEPARTMENT_OTHER): Admission: RE | Admit: 2019-06-30 | Payer: Medicare Other | Source: Ambulatory Visit

## 2019-06-30 ENCOUNTER — Other Ambulatory Visit: Payer: Self-pay

## 2019-06-30 ENCOUNTER — Ambulatory Visit (INDEPENDENT_AMBULATORY_CARE_PROVIDER_SITE_OTHER): Payer: Medicare Other

## 2019-06-30 ENCOUNTER — Ambulatory Visit (HOSPITAL_BASED_OUTPATIENT_CLINIC_OR_DEPARTMENT_OTHER)
Admission: RE | Admit: 2019-06-30 | Discharge: 2019-06-30 | Disposition: A | Discharge status: Home / Self Care | Payer: Medicare Other | Source: Ambulatory Visit | Attending: Family Medicine | Admitting: Family Medicine

## 2019-06-30 DIAGNOSIS — E2839 Other primary ovarian failure: Secondary | ICD-10-CM | POA: Diagnosis not present

## 2019-06-30 DIAGNOSIS — M8588 Other specified disorders of bone density and structure, other site: Secondary | ICD-10-CM | POA: Diagnosis not present

## 2019-06-30 DIAGNOSIS — Z23 Encounter for immunization: Secondary | ICD-10-CM

## 2019-07-01 ENCOUNTER — Telehealth: Payer: Self-pay | Admitting: Pulmonary Disease

## 2019-07-01 NOTE — Telephone Encounter (Signed)
Patient states her oxygen sats are dropping with extreme exertion. She was at [facility] and her sats dropped to 69% was offered to go to ED but patient refused. Again today instructed to go to ED if her sats are that low, but in her house she states her Oxygen goes to 84-86% but does her pursed lip breathing it comes back up. She doesn't want a visit with a NP Dr. Halford Chessman has opening for 07/04/19 at 0900 pt knows how to use mychart so mychart appointment scheduled. Patient instructed again when getting off phone to go to ED or call EMS if her sats drop that low again over the weekend. Patient voiced understanding and agreed.   Patient did not want me to refill her symbicort, she has been off it for 3 days and she has spiriva, albuterol, and atrovent . I said I could send in 1 month , but she said she wasn't going to get it she wanted to talk to Dr. Halford Chessman about new medication

## 2019-07-04 ENCOUNTER — Telehealth: Payer: Self-pay | Admitting: General Surgery

## 2019-07-04 ENCOUNTER — Telehealth: Payer: Medicare Other | Admitting: Pulmonary Disease

## 2019-07-04 NOTE — Telephone Encounter (Signed)
Called the patient before her video visit to go over medications, allergies and confirmation for the need of visit added to the schedule.  The patient stated her O2 will drop quickly when she walks and exerts herself. She said she will be fine when seated, but last week on Thursday really made her nervous because she dropped to 69% and on 5L with the portable O2.  Patient stated that when she sits down and follows the breathing instructions previously given, her O2 will go back up. Patient said during the call that when her O2 drops so quickly she feels like she is dying.  Information given to Dr. Halford Chessman, who directed the patient to have an in office visit with him this week. She has to talk to her husband (who was at work) to see when he can bring her in.  Dr. Halford Chessman stated the patient can be double booked tomorrow, or placed in a slot for his schedule on Wednesday.

## 2019-07-05 ENCOUNTER — Ambulatory Visit (HOSPITAL_BASED_OUTPATIENT_CLINIC_OR_DEPARTMENT_OTHER): Payer: Medicare Other

## 2019-07-06 ENCOUNTER — Other Ambulatory Visit: Payer: Self-pay

## 2019-07-06 ENCOUNTER — Encounter: Payer: Self-pay | Admitting: Pulmonary Disease

## 2019-07-06 ENCOUNTER — Ambulatory Visit: Payer: Medicare Other | Admitting: Pulmonary Disease

## 2019-07-06 VITALS — BP 134/86 | HR 104 | Temp 97.9°F | Ht 63.0 in | Wt 215.4 lb

## 2019-07-06 DIAGNOSIS — J4530 Mild persistent asthma, uncomplicated: Secondary | ICD-10-CM | POA: Diagnosis not present

## 2019-07-06 DIAGNOSIS — J9611 Chronic respiratory failure with hypoxia: Secondary | ICD-10-CM

## 2019-07-06 DIAGNOSIS — D869 Sarcoidosis, unspecified: Secondary | ICD-10-CM

## 2019-07-06 DIAGNOSIS — R0600 Dyspnea, unspecified: Secondary | ICD-10-CM

## 2019-07-06 DIAGNOSIS — R0609 Other forms of dyspnea: Secondary | ICD-10-CM

## 2019-07-06 MED ORDER — TRELEGY ELLIPTA 100-62.5-25 MCG/INH IN AEPB: 1.0000 | INHALATION_SPRAY | Freq: Every day | RESPIRATORY_TRACT | 5 refills | Status: DC

## 2019-07-06 NOTE — Progress Notes (Signed)
[facility]  Chief Complaint  Patient presents with  . Shortness of Breath    Shortness of breath has worsened even when on 5L pulse. Gets very short of breath even when walking a short distance.     Constitutional:  BP 134/86 (BP Location: Right Arm, Patient Position: Sitting, Cuff Size: Large)   Pulse (!) 104   Temp 97.9 F (36.6 C)   Ht _0  (1.6 m)   Wt 215 lb 6.4 oz (97.7 kg)   SpO2 98% Comment: 4L continous  BMI 38.16 kg/m   Past Medical History:  Anemia, Anxiety, Barrett's Esophagus, Depression, HH, IBS, PTX  Brief Summary:  [name] is a 68 y.o. female former smoker with sarcoidosis, asthma, and chronic respiratory failure.  She has been getting progressively more short of breath.  She is cough with clear sputum.  She feels congested in her chest.  She has limited her activity level since the beginning of COVID pandemic.  Her breathing gets terrible with minimal amount of activity.  She is afraid that she won't be able to breath anymore.  She gets very nervous and anxious.  She then has sudden urge to use the bathroom.  She was getting more swelling in her legs.  Started using lasix and this has helped.  She uses 2 liters oxygen at rest, but has to go up to 4 liters with exertion.  She bought a nebulizer machine and has been using saline bullets with this.  This has helped with expectoration.  She hasn't been using flutter valve recently.  Not having chest pain, fever, sweats, or hemoptysis.    Physical Exam:   Appearance - well kempt, wearing oxygen  ENMT - no sinus tenderness, no nasal discharge, no oral exudate  Neck - no masses, trachea midline, no thyromegaly, no elevation in JVP  Respiratory - normal appearance of chest wall, normal respiratory effort w/o accessory muscle use, no dullness on percussion, b/l crackles  CV - s1s2 regular rate and rhythm, no murmurs, no peripheral edema, radial pulses symmetric  GI  - soft, non tender  Lymph - no adenopathy noted in neck and axillary areas  MSK - normal gait  Ext - no cyanosis, clubbing, or joint inflammation noted  Skin - no rashes, lesions, or ulcers  Neuro - normal strength, oriented x 3  Psych - normal mood and affect  Discussion:  She has progressive dyspnea, cough, and sputum.  This is likely from a combination of her sarcoidosis and asthma.  Assessment/Plan:   Pulmonary sarcoidosis. - arrange for PFT, HRCT chest, CBC, CMET, ESR, and ACE level - might benefit from referral to pulmonary rehab again at some point - might need f/u Echo to assess for right sided pressures  Persistent asthma. - will try changing her to trelegy - advised her to try using flutter valve more when she has chest congestion - prn nebulized saline to help with expectoration  Chronic respiratory failure with hypoxia. - uses 2 to 4 liters oxygen 24/7 - goal SpO2 > 90%  Leg edema. - continue lasix as prescribed by PCP    Patient Instructions  Lab tests today  Will schedule pulmonary function test and high resolution CT chest   Trelegy one puff daily, and rinse mouth after each use  Follow up in 2 weeks, and this can be a tele visit   A total of  27 minutes were spent face to face with the patient and  more than half of that time involved counseling or coordination of care.   Chesley Mires, MD [facility] Pager: [phone] 07/06/2019, 5:15 PM  Flow Sheet    Pulmonary tests:  PFT 08/14/16 >> 1.60 (88%), FEV1% 87, TLC 3.34 (68%), DLCO 35%, +BD  Chest imaging:  CT chest 09/20/17 >> subpleural reticulation, distortion, honeycombing b/l  Cardiac tests:  Echo 08/14/16 >> EF 60 to 65%, grade 1 DD, PAS 30 mmHg  Medications:   Allergies as of 07/06/2019      Reactions   Gabapentin Swelling   EDEMA WITH POSITIVE RECHALLENGE   Lactose Intolerance (gi) Diarrhea, Other (See Comments)   Stomach cramps   Pineapple Hives,  Itching   Penicillins Other (See Comments)   Unknown - childhood allergy Has patient had a PCN reaction causing immediate rash, facial/tongue/throat swelling, SOB or lightheadedness with hypotension: Unknown Has patient had a PCN reaction causing severe rash involving mucus membranes or skin necrosis: Unknown Has patient had a PCN reaction that required hospitalization: Unknown Has patient had a PCN reaction occurring within the last 10 years: No If all of the above answers are "NO", then may proceed with Cephalosporin use.   Aspirin Other (See Comments)   gastritis      Medication List       Accurate as of July 06, 2019  5:15 PM. If you have any questions, ask your nurse or doctor.        STOP taking these medications   amitriptyline 10 MG tablet Commonly known as: ELAVIL Stopped by: Chesley Mires, MD   budesonide-formoterol 160-4.5 MCG/ACT inhaler Commonly known as: Symbicort Stopped by: Chesley Mires, MD   oxyCODONE-acetaminophen 5-325 MG tablet Commonly known as: PERCOCET/ROXICET Stopped by: Chesley Mires, MD   Spiriva Respimat 2.5 MCG/ACT Aers Generic drug: Tiotropium Bromide Monohydrate Stopped by: Chesley Mires, MD   valACYclovir 1000 MG tablet Commonly known as: VALTREX Stopped by: Chesley Mires, MD     TAKE these medications   ALOE VERA JUICE PO Take 120 mLs by mouth daily.   Artificial Tears 1.4 % ophthalmic solution Generic drug: polyvinyl alcohol Place 1 drop into both eyes See admin instructions. Instill 1 drop in to both eyes once or twice daily as needed for dry eyes - in between restasis doses   ASTRAGALUS PO Take 1 tablet by mouth daily.   CALCIUM-D PO Take 1 tablet by mouth daily.   Collagen Hydrolysate Powd Take 1 scoop by mouth daily with supper.   cycloSPORINE 0.05 % ophthalmic emulsion Commonly known as: RESTASIS Place 1 drop into both eyes 2 (two) times daily.   Flutter Devi 1 application by Does not apply route 2 (two) times daily.    furosemide 20 MG tablet Commonly known as: LASIX Take 1 tablet (20 mg total) by mouth daily. Use as needed for swelling   ipratropium 17 MCG/ACT inhaler Commonly known as: ATROVENT HFA Inhale 2 puffs into the lungs every 6 (six) hours as needed for wheezing.   loratadine 10 MG tablet Commonly known as: CLARITIN Take 10 mg by mouth daily.   Mucinex 600 MG 12 hr tablet Generic drug: guaiFENesin Take 1 tablet by mouth daily.   multivitamin with minerals Tabs tablet Take 1 tablet by mouth daily.   OMEGA 3-6-9 COMPLEX PO Take 1 capsule by mouth daily.   ondansetron 4 MG disintegrating tablet Commonly known as: Zofran ODT Take 1 tablet (4 mg total) by mouth every 8 (eight) hours as needed for nausea or vomiting.  OXYGEN Inhale 2-4 L into the lungs continuous. If moving around it is increased   pantoprazole 20 MG tablet Commonly known as: PROTONIX TAKE 1 TABLET (20 MG TOTAL) BY MOUTH DAILY BEFORE BREAKFAST   phenylephrine 10 MG Tabs tablet Commonly known as: SUDAFED PE Take 10 mg by mouth daily.   PREBIOTIC PRODUCT PO Take 1 scoop by mouth daily before breakfast.   Rhinase Soln Place 2 sprays into the nose 2 (two) times daily as needed (dry, bloody nostrils).   Trelegy Ellipta 100-62.5-25 MCG/INH Aepb Generic drug: Fluticasone-Umeclidin-Vilant Inhale 1 puff into the lungs daily. Started by: Chesley Mires, MD   Ventolin HFA 108 (90 Base) MCG/ACT inhaler Generic drug: albuterol INHALE 2 PUFFS INTO THE LUNGS EVERY 4 (FOUR) HOURS AS NEEDED FOR WHEEZING OR SHORTNESS OF BREATH.   Vitamin B-12 5000 MCG Tbdp Take 5,000 mcg by mouth daily.   VITAMIN C PO Take 1 tablet by mouth daily. 1000 MG       Past Surgical History:  She  has a past surgical history that includes Cholecystectomy; Rotator cuff repair; Cataract extraction, bilateral; Bronchial biopsy (1996); Nissen fundoplication; BRAVO ph study (N/A, 10/10/2013); Esophagogastroduodenoscopy (N/A, 10/10/2013); Chest tube  insertion (Right, 04/23/2017); Video bronchoscopy with insertion of interbronchial valve (ibv) (N/A, 05/08/2017); Appendectomy; and Video bronchoscopy with insertion of interbronchial valve (ibv) (N/A, 07/10/2017).  Family History:  Her family history is not on file. She was adopted.  Social History:  She  reports that she quit smoking about 40 years ago. Her smoking use included cigarettes. She has a 20.00 pack-year smoking history. She has never used smokeless tobacco. She reports that she does not drink alcohol or use drugs.

## 2019-07-06 NOTE — Patient Instructions (Signed)
Lab tests today  Will schedule pulmonary function test and high resolution CT chest   Trelegy one puff daily, and rinse mouth after each use  Follow up in 2 weeks, and this can be a tele visit

## 2019-07-07 LAB — COMPREHENSIVE METABOLIC PANEL
ALT: 16 U/L (ref 0–35)
AST: 30 U/L (ref 0–37)
Albumin: 4.4 g/dL (ref 3.5–5.2)
Alkaline Phosphatase: 58 U/L (ref 39–117)
BUN: 13 mg/dL (ref 6–23)
CO2: 33 mEq/L — ABNORMAL HIGH (ref 19–32)
Calcium: 10.2 mg/dL (ref 8.4–10.5)
Chloride: 99 mEq/L (ref 96–112)
Creatinine, Ser: 1.11 mg/dL (ref 0.40–1.20)
GFR: 58.67 mL/min — ABNORMAL LOW (ref >60.00)
Glucose, Bld: 99 mg/dL (ref 70–99)
Potassium: 3.8 mEq/L (ref 3.5–5.1)
Sodium: 139 mEq/L (ref 135–145)
Total Bilirubin: 0.3 mg/dL (ref 0.2–1.2)
Total Protein: 8.5 g/dL — ABNORMAL HIGH (ref 6.0–8.3)

## 2019-07-07 LAB — CBC WITH DIFFERENTIAL/PLATELET
Basophils Absolute: 0 10*3/uL (ref 0.0–0.1)
Basophils Relative: 0.5 % (ref 0.0–3.0)
Eosinophils Absolute: 0.5 10*3/uL (ref 0.0–0.7)
Eosinophils Relative: 7.5 % — ABNORMAL HIGH (ref 0.0–5.0)
HCT: 37.7 % (ref 36.0–46.0)
Hemoglobin: 12.6 g/dL (ref 12.0–15.0)
Lymphocytes Relative: 38.7 % (ref 12.0–46.0)
Lymphs Abs: 2.5 10*3/uL (ref 0.7–4.0)
MCHC: 33.3 g/dL (ref 30.0–36.0)
MCV: 92 fl (ref 78.0–100.0)
Monocytes Absolute: 0.9 10*3/uL (ref 0.1–1.0)
Monocytes Relative: 14.4 % — ABNORMAL HIGH (ref 3.0–12.0)
Neutro Abs: 2.5 10*3/uL (ref 1.4–7.7)
Neutrophils Relative %: 38.9 % — ABNORMAL LOW (ref 43.0–77.0)
Platelets: 253 10*3/uL (ref 150.0–400.0)
RBC: 4.1 Mil/uL (ref 3.87–5.11)
RDW: 13.4 % (ref 11.5–15.5)
WBC: 6.4 10*3/uL (ref 4.0–10.5)

## 2019-07-07 LAB — SEDIMENTATION RATE: Sed Rate: 79 mm/h — ABNORMAL HIGH (ref 0–30)

## 2019-07-08 LAB — ANGIOTENSIN CONVERTING ENZYME: Angiotensin-Converting Enzyme: 67 U/L (ref 9–67)

## 2019-07-12 ENCOUNTER — Ambulatory Visit (HOSPITAL_BASED_OUTPATIENT_CLINIC_OR_DEPARTMENT_OTHER): Admission: RE | Admit: 2019-07-12 | Payer: Medicare Other | Source: Ambulatory Visit

## 2019-07-12 ENCOUNTER — Ambulatory Visit (HOSPITAL_BASED_OUTPATIENT_CLINIC_OR_DEPARTMENT_OTHER): Payer: Medicare Other

## 2019-07-12 ENCOUNTER — Other Ambulatory Visit (HOSPITAL_BASED_OUTPATIENT_CLINIC_OR_DEPARTMENT_OTHER): Payer: Medicare Other

## 2019-07-15 ENCOUNTER — Ambulatory Visit: Payer: Medicare Other | Admitting: Gastroenterology

## 2019-07-19 ENCOUNTER — Encounter: Payer: Self-pay | Admitting: Pulmonary Disease

## 2019-07-19 ENCOUNTER — Encounter (HOSPITAL_BASED_OUTPATIENT_CLINIC_OR_DEPARTMENT_OTHER): Payer: Self-pay

## 2019-07-19 ENCOUNTER — Ambulatory Visit (INDEPENDENT_AMBULATORY_CARE_PROVIDER_SITE_OTHER): Payer: Medicare Other | Admitting: Pulmonary Disease

## 2019-07-19 ENCOUNTER — Ambulatory Visit (HOSPITAL_BASED_OUTPATIENT_CLINIC_OR_DEPARTMENT_OTHER)
Admission: RE | Admit: 2019-07-19 | Discharge: 2019-07-19 | Disposition: A | Discharge status: Home / Self Care | Payer: Medicare Other | Source: Ambulatory Visit | Attending: Pulmonary Disease | Admitting: Pulmonary Disease

## 2019-07-19 ENCOUNTER — Other Ambulatory Visit: Payer: Self-pay

## 2019-07-19 ENCOUNTER — Ambulatory Visit (HOSPITAL_BASED_OUTPATIENT_CLINIC_OR_DEPARTMENT_OTHER)
Admission: RE | Admit: 2019-07-19 | Discharge: 2019-07-19 | Disposition: A | Discharge status: Home / Self Care | Payer: Medicare Other | Source: Ambulatory Visit | Attending: Family Medicine | Admitting: Family Medicine

## 2019-07-19 DIAGNOSIS — R0602 Shortness of breath: Secondary | ICD-10-CM | POA: Diagnosis not present

## 2019-07-19 DIAGNOSIS — I700 Atherosclerosis of aorta: Secondary | ICD-10-CM | POA: Diagnosis not present

## 2019-07-19 DIAGNOSIS — I2510 Atherosclerotic heart disease of native coronary artery without angina pectoris: Secondary | ICD-10-CM | POA: Diagnosis not present

## 2019-07-19 DIAGNOSIS — Z1231 Encounter for screening mammogram for malignant neoplasm of breast: Secondary | ICD-10-CM | POA: Diagnosis not present

## 2019-07-19 DIAGNOSIS — D869 Sarcoidosis, unspecified: Secondary | ICD-10-CM

## 2019-07-19 DIAGNOSIS — Z1239 Encounter for other screening for malignant neoplasm of breast: Secondary | ICD-10-CM

## 2019-07-19 NOTE — Progress Notes (Signed)
Appointment reschedule to 07/20/19 after her CT chest is completed.

## 2019-07-20 ENCOUNTER — Ambulatory Visit (INDEPENDENT_AMBULATORY_CARE_PROVIDER_SITE_OTHER): Payer: Medicare Other | Admitting: Pulmonary Disease

## 2019-07-20 ENCOUNTER — Encounter: Payer: Self-pay | Admitting: Pulmonary Disease

## 2019-07-20 DIAGNOSIS — J4530 Mild persistent asthma, uncomplicated: Secondary | ICD-10-CM

## 2019-07-20 DIAGNOSIS — J9611 Chronic respiratory failure with hypoxia: Secondary | ICD-10-CM | POA: Diagnosis not present

## 2019-07-20 DIAGNOSIS — D869 Sarcoidosis, unspecified: Secondary | ICD-10-CM | POA: Diagnosis not present

## 2019-07-20 MED ORDER — PREDNISONE 20 MG PO TABS: ORAL_TABLET | ORAL | 0 refills | Status: DC

## 2019-07-20 NOTE — Progress Notes (Signed)
[facility]  Chief Complaint  Patient presents with  . Follow-up    review CT and labs.     Constitutional:  There were no vitals taken for this visit.  Deferred.  Past Medical History:  Anemia, Anxiety, Barrett's Esophagus, Depression, HH, IBS, PTX  Brief Summary:  [name] is a 68 y.o. female former smoker with sarcoidosis, asthma, and chronic respiratory failure.  Virtual Visit via Telephone Note  I connected with MARNITA POIRIER on 07/20/19 at 12:00 PM EST by telephone and verified that I am speaking with the correct person using two identifiers.  Location: Patient: home Provider: medical office   I discussed the limitations, risks, security and privacy concerns of performing an evaluation and management service by telephone and the availability of in person appointments. I also discussed with the patient that there may be a patient responsible charge related to this service. The patient expressed understanding and agreed to proceed.  Since her last visit she had lab work and HRCT chest.  ACE level was on high side of normal, ESR 79, CO2 was 33.  HRCT chest (reviewed by me) showed progression of changes from Sarcoid.  She continues to have cough, dyspnea, and sputum production.  Trelegy is working better than symbicort.   Physical Exam:   Deferred.   Assessment/Plan:   Pulmonary sarcoidosis. - reviewed her test findings with her - she has progression of already advanced sarcoidosis - will start prednisone 40 mg daily and then taper to 20 mg daily over next couple of weeks; she will then remain on 20 mg prednisone daily until next follow up on 08/26/19 - will then determine if she should be consider for a steroid sparing agent such at methotrexate  Persistent asthma. - improved with use of trelegy; will continue this - prn nebulized saline, albuterol, and flutter valve  Chronic respiratory failure with hypoxia. -  uses 2 to 4 liters oxygen 24/7 - goal SpO2 > 90%  Leg edema. - continue lasix as prescribed by PCP    Patient Instructions  Prednisone 20 mg pill >> 2 pills daily for 8 days, 1.5 pills daily for 8 days, then 1 pill daily until next appointment on 08/26/19      I discussed the assessment and treatment plan with the patient. The patient was provided an opportunity to ask questions and all were answered. The patient agreed with the plan and demonstrated an understanding of the instructions.   The patient was advised to call back or seek an in-person evaluation if the symptoms worsen or if the condition fails to improve as anticipated.  I provided 22 minutes of non-face-to-face time during this encounter.    Chesley Mires, MD [facility] Pager: [phone] 07/20/2019, 12:13 PM  Flow Sheet    Pulmonary tests:  PFT 08/14/16 >> 1.60 (88%), FEV1% 87, TLC 3.34 (68%), DLCO 35%, +BD  Serology:  ACE 07/08/19 >> 67  Chest imaging:  CT chest 09/20/17 >> subpleural reticulation, distortion, honeycombing b/l HRCT chest 07/20/19 >> atherosclerosis, borderline LAN, extensive cylindrical and varicose BTX, severe peribronchovascular thickening, extensive asymmetric honeycombing  Cardiac tests:  Echo 08/14/16 >> EF 60 to 65%, grade 1 DD, PAS 30 mmHg  Medications:   Allergies as of 07/20/2019      Reactions   Gabapentin Swelling   EDEMA WITH POSITIVE RECHALLENGE   Lactose Intolerance (gi) Diarrhea, Other (See Comments)   Stomach cramps   Pineapple Hives, Itching   Penicillins  Other (See Comments)   Unknown - childhood allergy Has patient had a PCN reaction causing immediate rash, facial/tongue/throat swelling, SOB or lightheadedness with hypotension: Unknown Has patient had a PCN reaction causing severe rash involving mucus membranes or skin necrosis: Unknown Has patient had a PCN reaction that required hospitalization: Unknown Has patient had a PCN reaction  occurring within the last 10 years: No If all of the above answers are "NO", then may proceed with Cephalosporin use.   Aspirin Other (See Comments)   gastritis      Medication List       Accurate as of July 20, 2019 12:13 PM. If you have any questions, ask your nurse or doctor.        ALOE VERA JUICE PO Take 120 mLs by mouth daily.   Artificial Tears 1.4 % ophthalmic solution Generic drug: polyvinyl alcohol Place 1 drop into both eyes See admin instructions. Instill 1 drop in to both eyes once or twice daily as needed for dry eyes - in between restasis doses   ASTRAGALUS PO Take 1 tablet by mouth daily.   CALCIUM-D PO Take 1 tablet by mouth daily.   Collagen Hydrolysate Powd Take 1 scoop by mouth daily with supper.   cycloSPORINE 0.05 % ophthalmic emulsion Commonly known as: RESTASIS Place 1 drop into both eyes 2 (two) times daily.   Flutter Devi 1 application by Does not apply route 2 (two) times daily.   furosemide 20 MG tablet Commonly known as: LASIX Take 1 tablet (20 mg total) by mouth daily. Use as needed for swelling   ipratropium 17 MCG/ACT inhaler Commonly known as: ATROVENT HFA Inhale 2 puffs into the lungs every 6 (six) hours as needed for wheezing.   loratadine 10 MG tablet Commonly known as: CLARITIN Take 10 mg by mouth daily.   Mucinex 600 MG 12 hr tablet Generic drug: guaiFENesin Take 1 tablet by mouth daily.   multivitamin with minerals Tabs tablet Take 1 tablet by mouth daily.   OMEGA 3-6-9 COMPLEX PO Take 1 capsule by mouth daily.   ondansetron 4 MG disintegrating tablet Commonly known as: Zofran ODT Take 1 tablet (4 mg total) by mouth every 8 (eight) hours as needed for nausea or vomiting.   OXYGEN Inhale 2-4 L into the lungs continuous. If moving around it is increased   pantoprazole 20 MG tablet Commonly known as: PROTONIX TAKE 1 TABLET (20 MG TOTAL) BY MOUTH DAILY BEFORE BREAKFAST   phenylephrine 10 MG Tabs tablet  Commonly known as: SUDAFED PE Take 10 mg by mouth daily.   PREBIOTIC PRODUCT PO Take 1 scoop by mouth daily before breakfast.   predniSONE 20 MG tablet Commonly known as: DELTASONE Take 2 tablets (40 mg total) by mouth daily with breakfast for 8 days, THEN 1.5 tablets (30 mg total) daily with breakfast for 8 days, THEN 1 tablet (20 mg total) daily with breakfast for 22 days. Start taking on: July 20, 2019 Started by: Chesley Mires, MD   Rhinase Soln Place 2 sprays into the nose 2 (two) times daily as needed (dry, bloody nostrils).   Trelegy Ellipta 100-62.5-25 MCG/INH Aepb Generic drug: Fluticasone-Umeclidin-Vilant Inhale 1 puff into the lungs daily.   Ventolin HFA 108 (90 Base) MCG/ACT inhaler Generic drug: albuterol INHALE 2 PUFFS INTO THE LUNGS EVERY 4 (FOUR) HOURS AS NEEDED FOR WHEEZING OR SHORTNESS OF BREATH.   Vitamin B-12 5000 MCG Tbdp Take 5,000 mcg by mouth daily.   VITAMIN C PO Take 1 tablet  by mouth daily. 1000 MG       Past Surgical History:  She  has a past surgical history that includes Cholecystectomy; Rotator cuff repair; Cataract extraction, bilateral; Bronchial biopsy (1996); Nissen fundoplication; BRAVO ph study (N/A, 10/10/2013); Esophagogastroduodenoscopy (N/A, 10/10/2013); Chest tube insertion (Right, 04/23/2017); Video bronchoscopy with insertion of interbronchial valve (ibv) (N/A, 05/08/2017); Appendectomy; and Video bronchoscopy with insertion of interbronchial valve (ibv) (N/A, 07/10/2017).  Family History:  Her family history is not on file. She was adopted.  Social History:  She  reports that she quit smoking about 40 years ago. Her smoking use included cigarettes. She has a 20.00 pack-year smoking history. She has never used smokeless tobacco. She reports that she does not drink alcohol or use drugs.

## 2019-07-20 NOTE — Patient Instructions (Signed)
Prednisone 20 mg pill >> 2 pills daily for 8 days, 1.5 pills daily for 8 days, then 1 pill daily until next appointment on 08/26/19

## 2019-07-26 DIAGNOSIS — D869 Sarcoidosis, unspecified: Secondary | ICD-10-CM | POA: Diagnosis not present

## 2019-07-26 DIAGNOSIS — J45909 Unspecified asthma, uncomplicated: Secondary | ICD-10-CM | POA: Diagnosis not present

## 2019-08-24 ENCOUNTER — Telehealth: Payer: Self-pay | Admitting: Family Medicine

## 2019-08-24 NOTE — Telephone Encounter (Signed)
Spoke with Mrs. Knierim regarding AWV. Patient declined to schedule at this time, but will give office a call to schedule when she is ready.

## 2019-08-25 DIAGNOSIS — J45909 Unspecified asthma, uncomplicated: Secondary | ICD-10-CM | POA: Diagnosis not present

## 2019-08-25 DIAGNOSIS — D869 Sarcoidosis, unspecified: Secondary | ICD-10-CM | POA: Diagnosis not present

## 2019-08-26 ENCOUNTER — Ambulatory Visit (INDEPENDENT_AMBULATORY_CARE_PROVIDER_SITE_OTHER): Payer: Medicare Other | Admitting: Pulmonary Disease

## 2019-08-26 ENCOUNTER — Other Ambulatory Visit: Payer: Self-pay

## 2019-08-26 ENCOUNTER — Encounter: Payer: Self-pay | Admitting: Pulmonary Disease

## 2019-08-26 VITALS — BP 118/84 | HR 112 | Temp 97.1°F | Ht 63.0 in | Wt 227.2 lb

## 2019-08-26 DIAGNOSIS — J9611 Chronic respiratory failure with hypoxia: Secondary | ICD-10-CM

## 2019-08-26 DIAGNOSIS — D860 Sarcoidosis of lung: Secondary | ICD-10-CM | POA: Diagnosis not present

## 2019-08-26 MED ORDER — PREDNISONE 10 MG PO TABS: 10.0000 mg | ORAL_TABLET | Freq: Every day | ORAL | 3 refills | Status: DC

## 2019-08-26 NOTE — Patient Instructions (Signed)
Prednisone 10 mg daily  Will have your home oxygen set up changed to 2 liters at rest and 7 liters with exertion  Follow up in 4 weeks

## 2019-08-26 NOTE — Progress Notes (Signed)
[facility]  Chief Complaint  Patient presents with  . Follow-up    Patient is here for ROV. Patient had extraction done on Monday and has been having trouble breathing since then. Patient states she is on 2L normally and has to bump up O2 when walking.    Constitutional:  BP 118/84 (BP Location: Right Arm, Patient Position: Sitting, Cuff Size: Large)   Pulse (!) 112   Temp (!) 97.1 F (36.2 C) (Temporal)   Ht 5\' 3"  (1.6 m)   Wt 227 lb 3.2 oz (103.1 kg)   SpO2 97% Comment: on 2L  BMI 40.25 kg/m    Past Medical History:  Anemia, Anxiety, Barrett's Esophagus, Depression, HH, IBS, PTX  Brief Summary:  [name] is a 68 y.o. female former smoker with sarcoidosis, asthma, and chronic respiratory failure.  Breathing improved some at rest with addition of prednisone.  Now on 10 mg daily.  Gets very winded with walking.  SpO2 drops to 70's on 4 liters pulsed.  Not having as much cough.  Not having fever, hemoptysis or chest pain.  trelegy helps.    Physical Exam:    Appearance - well kempt   ENMT - no sinus tenderness, no nasal discharge, no oral exudate  Neck - no masses, trachea midline, no thyromegaly, no elevation in JVP  Respiratory - normal appearance of chest wall, normal respiratory effort w/o accessory muscle use, no dullness on percussion, b/l crackles  CV - s1s2 regular rate and rhythm, no murmurs, no peripheral edema, radial pulses symmetric  GI - soft, non tender  Lymph - no adenopathy noted in neck and axillary areas  MSK - normal gait  Ext - no cyanosis, clubbing, or joint inflammation noted  Skin - no rashes, lesions, or ulcers  Neuro - normal strength, oriented x 3  Psych - normal mood and affect   Assessment/Plan:   Pulmonary sarcoidosis. - she has advanced disease - explained that much of her symptom status will likely be chronic - continue 10 mg prednisone daily - reassess status again in  few weeks  Persistent asthma. - continue trelegy - prn albuterol, saline, flutter valve  Chronic respiratory failure with hypoxia. - 2 liters at rest, but will need 7 liters with exertion  Leg edema. - continue lasix as prescribed by PCP    Patient Instructions  Prednisone 10 mg daily  Will have your home oxygen set up changed to 2 liters at rest and 7 liters with exertion  Follow up in 4 weeks   A total of  27 minutes were spent face to face with the patient and more than half of that time involved counseling or coordination of care.   Chesley Mires, MD [facility] Pager: [phone] 08/26/2019, 1:06 PM  Flow Sheet    Pulmonary tests:  PFT 08/14/16 >> 1.60 (88%), FEV1% 87, TLC 3.34 (68%), DLCO 35%, +BD  Serology:  ACE 07/08/19 >> 67  Chest imaging:  CT chest 09/20/17 >> subpleural reticulation, distortion, honeycombing b/l HRCT chest 07/20/19 >> atherosclerosis, borderline LAN, extensive cylindrical and varicose BTX, severe peribronchovascular thickening, extensive asymmetric honeycombing  Cardiac tests:  Echo 08/14/16 >> EF 60 to 65%, grade 1 DD, PAS 30 mmHg  Medications:   Allergies as of 08/26/2019      Reactions   Gabapentin Swelling   EDEMA WITH POSITIVE RECHALLENGE   Lactose Intolerance (gi) Diarrhea, Other (See Comments)   Stomach cramps   Pineapple Hives, Itching  Penicillins Other (See Comments)   Unknown - childhood allergy Has patient had a PCN reaction causing immediate rash, facial/tongue/throat swelling, SOB or lightheadedness with hypotension: Unknown Has patient had a PCN reaction causing severe rash involving mucus membranes or skin necrosis: Unknown Has patient had a PCN reaction that required hospitalization: Unknown Has patient had a PCN reaction occurring within the last 10 years: No If all of the above answers are "NO", then may proceed with Cephalosporin use.   Aspirin Other (See Comments)   gastritis       Medication List       Accurate as of August 26, 2019  1:06 PM. If you have any questions, ask your nurse or doctor.        STOP taking these medications   loratadine 10 MG tablet Commonly known as: CLARITIN Stopped by: Coralyn Helling, MD     TAKE these medications   ALOE VERA JUICE PO Take 120 mLs by mouth daily.   Artificial Tears 1.4 % ophthalmic solution Generic drug: polyvinyl alcohol Place 1 drop into both eyes See admin instructions. Instill 1 drop in to both eyes once or twice daily as needed for dry eyes - in between restasis doses   ASTRAGALUS PO Take 1 tablet by mouth daily.   CALCIUM-D PO Take 1 tablet by mouth daily.   Collagen Hydrolysate Powd Take 1 scoop by mouth daily with supper.   cycloSPORINE 0.05 % ophthalmic emulsion Commonly known as: RESTASIS Place 1 drop into both eyes 2 (two) times daily.   Flutter Devi 1 application by Does not apply route 2 (two) times daily.   furosemide 20 MG tablet Commonly known as: LASIX Take 1 tablet (20 mg total) by mouth daily. Use as needed for swelling   ipratropium 17 MCG/ACT inhaler Commonly known as: ATROVENT HFA Inhale 2 puffs into the lungs every 6 (six) hours as needed for wheezing.   Mucinex 600 MG 12 hr tablet Generic drug: guaiFENesin Take 1 tablet by mouth daily.   multivitamin with minerals Tabs tablet Take 1 tablet by mouth daily.   OMEGA 3-6-9 COMPLEX PO Take 1 capsule by mouth daily.   ondansetron 4 MG disintegrating tablet Commonly known as: Zofran ODT Take 1 tablet (4 mg total) by mouth every 8 (eight) hours as needed for nausea or vomiting.   OXYGEN Inhale 2-4 L into the lungs continuous. If moving around it is increased   pantoprazole 20 MG tablet Commonly known as: PROTONIX TAKE 1 TABLET (20 MG TOTAL) BY MOUTH DAILY BEFORE BREAKFAST   phenylephrine 10 MG Tabs tablet Commonly known as: SUDAFED PE Take 10 mg by mouth daily.   PREBIOTIC PRODUCT PO Take 1 scoop by mouth daily  before breakfast.   predniSONE 10 MG tablet Commonly known as: DELTASONE Take 1 tablet (10 mg total) by mouth daily with breakfast. What changed:   medication strength  See the new instructions. Changed by: Coralyn Helling, MD   Rhinase Soln Place 2 sprays into the nose 2 (two) times daily as needed (dry, bloody nostrils).   Trelegy Ellipta 100-62.5-25 MCG/INH Aepb Generic drug: Fluticasone-Umeclidin-Vilant Inhale 1 puff into the lungs daily.   Ventolin HFA 108 (90 Base) MCG/ACT inhaler Generic drug: albuterol INHALE 2 PUFFS INTO THE LUNGS EVERY 4 (FOUR) HOURS AS NEEDED FOR WHEEZING OR SHORTNESS OF BREATH.   Vitamin B-12 5000 MCG Tbdp Take 5,000 mcg by mouth daily.   VITAMIN C PO Take 1 tablet by mouth daily. 1000 MG   Xyzal  5 MG tablet Generic drug: levocetirizine Take 5 mg by mouth every evening.       Past Surgical History:  She  has a past surgical history that includes Cholecystectomy; Rotator cuff repair; Cataract extraction, bilateral; Bronchial biopsy (1996); Nissen fundoplication; BRAVO ph study (N/A, 10/10/2013); Esophagogastroduodenoscopy (N/A, 10/10/2013); Chest tube insertion (Right, 04/23/2017); Video bronchoscopy with insertion of interbronchial valve (ibv) (N/A, 05/08/2017); Appendectomy; and Video bronchoscopy with insertion of interbronchial valve (ibv) (N/A, 07/10/2017).  Family History:  Her family history is not on file. She was adopted.  Social History:  She  reports that she quit smoking about 40 years ago. Her smoking use included cigarettes. She has a 20.00 pack-year smoking history. She has never used smokeless tobacco. She reports that she does not drink alcohol or use drugs.

## 2019-08-29 ENCOUNTER — Encounter

## 2019-08-29 ENCOUNTER — Ambulatory Visit: Payer: Medicare Other | Admitting: Gastroenterology

## 2019-08-29 ENCOUNTER — Encounter: Payer: Self-pay | Admitting: Gastroenterology

## 2019-08-29 VITALS — BP 143/80 | HR 85 | Temp 97.6°F | Ht 65.0 in | Wt 227.0 lb

## 2019-08-29 DIAGNOSIS — Z1211 Encounter for screening for malignant neoplasm of colon: Secondary | ICD-10-CM

## 2019-08-29 DIAGNOSIS — R1310 Dysphagia, unspecified: Secondary | ICD-10-CM | POA: Diagnosis not present

## 2019-08-29 DIAGNOSIS — K219 Gastro-esophageal reflux disease without esophagitis: Secondary | ICD-10-CM | POA: Diagnosis not present

## 2019-08-29 MED ORDER — PANTOPRAZOLE SODIUM 40 MG PO TBEC: 40.0000 mg | DELAYED_RELEASE_TABLET | Freq: Two times a day (BID) | ORAL | 1 refills | Status: DC

## 2019-08-29 MED ORDER — SUCRALFATE 1 GM/10ML PO SUSP: 1.0000 g | Freq: Four times a day (QID) | ORAL | 3 refills | Status: DC | PRN

## 2019-08-29 NOTE — Progress Notes (Signed)
HPI :  68 year old female who have seen in the past with a history of asthma, pulmonary sarcoidosis, on supplemental oxygen, chronic reflux, seen back to establish care for reflux and dysphagia, I have not seen her since March 2017.  She has been taking Protonix 20 mg once a day as well as Nexium 20 mg over-the-counter as needed for symptoms of reflux.  She has pyrosis and regurgitation, acid burning her mouth which has bothered her more recently compared to times in the past.  Along with this she has been having some intermittent solid food dysphagia in her lower chest which has been bothering her for a long period of time.  She had a barium swallow in June 2018 for dysphagia which showed a benign-appearing stricture at the GEJ.  Due to her lung disease she has not had follow-up for this or dilation.  She states she was hospitalized around the time of her last barium swallow and could not come in for procedure.  She continues to take supplemental oxygen and states she is stable at this time and feeling at baseline.  She has been taking significant precautions to avoid getting the coronavirus.  She otherwise has had a colon cancer screening with Cologuard's most recently.  She had a negative Cologuard in 2017 and again had another negative exam in February of this year.  She denies any problems moving her bowels.  No blood in her stools.  She inquires about a colonoscopy.  She denies any family history of colon cancer, but she is adopted.  Her last EGD was in 2015.  She has a history of a Nissen fundoplication, she has had dilations in the past for dysphagia, she states this has provided benefit for her historically when she is needed.  No evidence of Barrett's esophagus on her last few exams,, back in 2009 she had an irregular Z-line with biopsies consistent with nondysplastic Barrett's.   Endoscopic history: Colonoscopy 2006 - normal EGD 01/10/2008 - Nissen, tongue of BE, dilated to 18mm - biopsies  c/w BE, no dysplasia EGD 03/13/10 - Nissen, irregular z-line - biopsies without BE EGD 10/18/13 - Nissen, irregular z-line, dilated with 52 Fr Maloney, Bravo placed - Demeester score normal - biopsies without BE   Barium swallow 02/10/17 - IMPRESSION: Narrowing at the gastroesophageal junction. 13 mm barium tablet becomes temporarily lodged at this level and clears with subsequent swallowing.  No laryngeal penetration or aspiration.  Tiny diverticulum cervicothoracic junction.  Mild tertiary wave contractions noted throughout the exam.  No worrisome esophageal mucosa abnormality noted. As patient has Barrett's esophagus per history provided, patient may require endoscopic surveillance to exclude subtle mucosal abnormality not detected by the present exam.  Cologuard 12/17/15 - negative Cologuard 10/27/18 - negative    Past Medical History:  Diagnosis Date  . Anemia   . Anxiety   . Asthma   . Barrett esophagus   . Depression   . GERD (gastroesophageal reflux disease)   . Hiatal hernia   . IBS (irritable bowel syndrome)   . Migraines   . Pneumothorax   . Pulmonary sarcoidosis (HCC)   . Supplemental oxygen dependent   . Wears dentures      Past Surgical History:  Procedure Laterality Date  . APPENDECTOMY    . BRAVO PH STUDY N/A 10/10/2013   Procedure: BRAVO PH STUDY;  Surgeon: Hart Carwin, MD;  Location: [facility];  Service: Endoscopy;  Laterality: N/A;  placed 29  . BRONCHIAL BIOPSY  1996  .  CATARACT EXTRACTION, BILATERAL    . CHEST TUBE INSERTION Right 04/23/2017   Procedure: CHEST TUBE INSERTION;  Surgeon: Ivin Poot, MD;  Location: [place];  Service: Thoracic;  Laterality: Right;  . CHOLECYSTECTOMY    . ESOPHAGOGASTRODUODENOSCOPY N/A 10/10/2013   Procedure: ESOPHAGOGASTRODUODENOSCOPY (EGD);  Surgeon: Lafayette Dragon, MD;  Location: [facility];  Service: Endoscopy;  Laterality: N/A;  pt asthmatic, RA 92% at rest.  Coughing frequently. Wheezes noted in upper  lung fields at auscultation. Pt instructed to use home inhaler prior to procedure and placed on supportive O2 @ 2 lpm in pre-procedural. Teaching done ie. use of inhaler. Pt states she ju  . NISSEN FUNDOPLICATION    . ROTATOR CUFF REPAIR     x3(2 on L, 1 on R)  . VIDEO BRONCHOSCOPY WITH INSERTION OF INTERBRONCHIAL VALVE (IBV) N/A 05/08/2017   Procedure: VIDEO BRONCHOSCOPY WITH INSERTION OF INTERBRONCHIAL VALVE (IBV);  Surgeon: Melrose Nakayama, MD;  Location: [facility];  Service: Thoracic;  Laterality: N/A;  . VIDEO BRONCHOSCOPY WITH INSERTION OF INTERBRONCHIAL VALVE (IBV) N/A 07/10/2017   Procedure: VIDEO BRONCHOSCOPY FOR REMOVAL OF ENDOSCOPIC BRONCHIAL VALVE;  Surgeon: Ivin Poot, MD;  Location: [place];  Service: Thoracic;  Laterality: N/A;   Family History  Adopted: Yes   Social History   Tobacco Use  . Smoking status: Former Smoker    Packs/day: 1.00    Years: 20.00    Pack years: 20.00    Types: Cigarettes    Quit date: 09/08/1978    Years since quitting: 41.0  . Smokeless tobacco: Never Used  Substance Use Topics  . Alcohol use: No  . Drug use: No   Current Outpatient Medications  Medication Sig Dispense Refill  . ALOE VERA JUICE PO Take 120 mLs by mouth daily.     . Ascorbic Acid (VITAMIN C PO) Take 1 tablet by mouth daily. 1000 MG    . ASTRAGALUS PO Take 1 tablet by mouth daily.     . Calcium Carbonate-Vitamin D (CALCIUM-D PO) Take 1 tablet by mouth daily.    . Collagen Hydrolysate POWD Take 1 scoop by mouth daily with supper.     . Cyanocobalamin (VITAMIN B-12) 5000 MCG TBDP Take 5,000 mcg by mouth daily.    . cycloSPORINE (RESTASIS) 0.05 % ophthalmic emulsion Place 1 drop into both eyes 2 (two) times daily.     . Fluticasone-Umeclidin-Vilant (TRELEGY ELLIPTA) 100-62.5-25 MCG/INH AEPB Inhale 1 puff into the lungs daily. 28 each 5  . furosemide (LASIX) 20 MG tablet Take 1 tablet (20 mg total) by mouth daily. Use as needed for swelling 90 tablet 1  . ipratropium  (ATROVENT HFA) 17 MCG/ACT inhaler Inhale 2 puffs into the lungs every 6 (six) hours as needed for wheezing. 1 Inhaler 1  . levocetirizine (XYZAL) 5 MG tablet Take 5 mg by mouth every evening.    . Multiple Vitamin (MULTIVITAMIN WITH MINERALS) TABS tablet Take 1 tablet by mouth daily.    . Nasal Moisturizer Combination (RHINASE) SOLN Place 2 sprays into the nose 2 (two) times daily as needed (dry, bloody nostrils).     . Omega 3-6-9 Fatty Acids (OMEGA 3-6-9 COMPLEX PO) Take 1 capsule by mouth daily.    . OXYGEN Inhale 2-4 L into the lungs continuous. If moving around it is increased    . polyvinyl alcohol (ARTIFICIAL TEARS) 1.4 % ophthalmic solution Place 1 drop into both eyes See admin instructions. Instill 1 drop in to both eyes once or  twice daily as needed for dry eyes - in between restasis doses    . PREBIOTIC PRODUCT PO Take 1 scoop by mouth daily before breakfast.     . predniSONE (DELTASONE) 10 MG tablet Take 1 tablet (10 mg total) by mouth daily with breakfast. 30 tablet 3  . Respiratory Therapy Supplies (FLUTTER) DEVI 1 application by Does not apply route 2 (two) times daily. 1 each 0  . VENTOLIN HFA 108 (90 Base) MCG/ACT inhaler INHALE 2 PUFFS INTO THE LUNGS EVERY 4 (FOUR) HOURS AS NEEDED FOR WHEEZING OR SHORTNESS OF BREATH. 18 Inhaler 3  . pantoprazole (PROTONIX) 40 MG tablet Take 1 tablet (40 mg total) by mouth 2 (two) times daily. 180 tablet 1  . sucralfate (CARAFATE) 1 GM/10ML suspension Take 10 mLs (1 g total) by mouth every 6 (six) hours as needed. 420 mL 3   No current facility-administered medications for this visit.   Allergies  Allergen Reactions  . Gabapentin Swelling    EDEMA WITH POSITIVE RECHALLENGE  . Lactose Intolerance (Gi) Diarrhea and Other (See Comments)    Stomach cramps  . Pineapple Hives and Itching  . Penicillins Other (See Comments)    Unknown - childhood allergy Has patient had a PCN reaction causing immediate rash, facial/tongue/throat swelling, SOB or  lightheadedness with hypotension: Unknown Has patient had a PCN reaction causing severe rash involving mucus membranes or skin necrosis: Unknown Has patient had a PCN reaction that required hospitalization: Unknown Has patient had a PCN reaction occurring within the last 10 years: No If all of the above answers are "NO", then may proceed with Cephalosporin use.  . Aspirin Other (See Comments)    gastritis     Review of Systems: All systems reviewed and negative except where noted in HPI.   Lab Results  Component Value Date   WBC 6.4 07/06/2019   HGB 12.6 07/06/2019   HCT 37.7 07/06/2019   MCV 92.0 07/06/2019   PLT 253.0 07/06/2019    Lab Results  Component Value Date   CREATININE 1.11 07/06/2019   BUN 13 07/06/2019   NA 139 07/06/2019   K 3.8 07/06/2019   CL 99 07/06/2019   CO2 33 (H) 07/06/2019    Lab Results  Component Value Date   ALT 16 07/06/2019   AST 30 07/06/2019   ALKPHOS 58 07/06/2019   BILITOT 0.3 07/06/2019     Physical Exam: BP (!) 143/80   Pulse 85   Temp 97.6 F (36.4 C)   Ht 5\' 5"  (1.651 m)   Wt 227 lb (103 kg)   SpO2 93%   BMI 37.77 kg/m  Constitutional: Pleasant,well-developed, female in no acute distress, wearing oxygen HEENT: Normocephalic and atraumatic. Conjunctivae are normal. No scleral icterus. Neck supple.  Cardiovascular: Normal rate, regular rhythm.  Pulmonary/chest: Effort normal and breath sounds normal.  Abdominal: Soft, nondistended, nontender. here are no masses palpable.  Extremities: no edema Lymphadenopathy: No cervical adenopathy noted. Neurological: Alert and oriented to person place and time. Skin: Skin is warm and dry. No rashes noted. Psychiatric: Normal mood and affect. Behavior is normal.   ASSESSMENT AND PLAN: 68 year old female here to reestablish care for issues as below, have not seen her since March 2017.  GERD / dysphagia / supplemental oxygen use - longstanding reflux and intermittent dysphagia in the  setting of severe underlying pulmonary disease.  Her dysphagia is likely due to the stricture noted on barium swallow a few years ago, this was benign-appearing at the time.  She is on low-dose Protonix and having fairly frequent breakthrough of her reflux symptoms otherwise.  We discussed options.  I discussed long-term risks/ benefits of chronic PPI use, renal function stable, I think she would benefit from higher dosing of PPI at this time light of her symptoms.  Will increase her Protonix to 40 mg once to twice a day as needed.  I will also give her some liquid Carafate to take as needed for breakthrough symptoms.  Hopefully this helps reduce her symptoms.  We otherwise discussed how aggressive she wanted to be with her dysphagia in regards to performing an EGD and dilating stricture at the GEJ.  Her respiratory status is tenuous, I do not think she has underlying malignancy driving this process as the stricture appears benign.  She wants to think about this but given her persistent symptoms she may be amenable to an EGD once the coronavirus pandemic is under better control.  We will touch base with her in a few months about this.  We discussed risks and benefits of EGD and dilation in light of her respiratory status, this would need to be done at the hospital.  She understands this and will think about it, we will touch base in few months to see how she wants to proceed.  Otherwise continue conservative measures as discussed.  Colon cancer screening - she has had 2 negative Cologuards and her labs are normal without any alarm symptoms.  I think likelihood of any significant lesion in her colon is low right now however if she would prefer to use optical colonoscopy for screening modalities and is getting sedation for an EGD we can consider that in the future.  After discussion of this she wants to hold off on colonoscopy if possible, reduce amount of anesthesia she needs which I think is reasonable.  Would  repeat a Cologuard in 2023 if she wants to continue with screening at that time.   Ileene PatrickSteven Misbah Hornaday, MD [facility]

## 2019-08-29 NOTE — Patient Instructions (Addendum)
If you are age 69 or older, your body mass index should be between 23-30. Your Body mass index is 37.77 kg/m. If this is out of the aforementioned range listed, please consider follow up with your Primary Care Provider.  If you are age 83 or younger, your body mass index should be between 19-25. Your Body mass index is 37.77 kg/m. If this is out of the aformentioned range listed, please consider follow up with your Primary Care Provider.   We have sent the following medications to your pharmacy for you to pick up at your convenience: Stop Nexium OTC.  Protonix 40 mg 1-2 times daily.  Carafate 10 ml every 6 hours as needed.  Thank you for entrusting me with your care and for choosing [facility], Dr. La Porte Cellar

## 2019-09-06 ENCOUNTER — Telehealth: Payer: Self-pay | Admitting: Pulmonary Disease

## 2019-09-06 NOTE — Telephone Encounter (Signed)
Spoke with patient. She stated that [employer] did contact her today and came by her house to setup her O2. She has noticed a big difference in her breathing since being placed on the 7L. [employer] will come back tomorrow to provide her with a POC.   Advised her if they do not return to please let us know. She verbalized understanding. Nothing further needed at time of call.

## 2019-09-21 ENCOUNTER — Encounter: Payer: Self-pay | Admitting: Family Medicine

## 2019-09-25 DIAGNOSIS — J45909 Unspecified asthma, uncomplicated: Secondary | ICD-10-CM | POA: Diagnosis not present

## 2019-09-25 DIAGNOSIS — D869 Sarcoidosis, unspecified: Secondary | ICD-10-CM | POA: Diagnosis not present

## 2019-09-27 ENCOUNTER — Ambulatory Visit: Payer: Medicare Other | Admitting: Pulmonary Disease

## 2019-09-28 ENCOUNTER — Ambulatory Visit: Payer: Medicare Other | Attending: Internal Medicine

## 2019-09-28 DIAGNOSIS — Z23 Encounter for immunization: Secondary | ICD-10-CM

## 2019-09-29 ENCOUNTER — Encounter: Payer: Self-pay | Admitting: Family Medicine

## 2019-10-19 ENCOUNTER — Ambulatory Visit: Payer: Medicare Other | Attending: Internal Medicine

## 2019-10-19 DIAGNOSIS — Z23 Encounter for immunization: Secondary | ICD-10-CM

## 2019-10-19 NOTE — Progress Notes (Signed)
   Covid-19 Vaccination Clinic  Name:  [name]    MRN: [id number] DOB: 04/10/1951  10/19/2019  [name] was observed post Covid-19 immunization for 15 minutes without incidence. She was provided with Vaccine Information Sheet and instruction to access the V-Safe system.   [name] was instructed to call 911 with any severe reactions post vaccine: Marland Kitchen Difficulty breathing  . Swelling of your face and throat  . A fast heartbeat  . A bad rash all over your body  . Dizziness and weakness    Immunizations Administered    Name Date Dose VIS Date Route   Pfizer COVID-19 Vaccine 10/19/2019  5:32 PM 0.3 mL 08/19/2019 Intramuscular   Manufacturer: [employer], [employer]   Lot: [id number]   NDC: [address]-4

## 2019-10-20 ENCOUNTER — Telehealth: Payer: Self-pay | Admitting: Pulmonary Disease

## 2019-10-20 ENCOUNTER — Ambulatory Visit: Payer: Medicare Other | Admitting: Pulmonary Disease

## 2019-10-20 ENCOUNTER — Encounter: Payer: Self-pay | Admitting: Pulmonary Disease

## 2019-10-20 ENCOUNTER — Other Ambulatory Visit: Payer: Self-pay

## 2019-10-20 ENCOUNTER — Ambulatory Visit (INDEPENDENT_AMBULATORY_CARE_PROVIDER_SITE_OTHER): Payer: Medicare Other | Admitting: Pulmonary Disease

## 2019-10-20 VITALS — BP 132/80 | HR 115 | Temp 97.6°F | Ht 65.0 in | Wt 235.6 lb

## 2019-10-20 DIAGNOSIS — D860 Sarcoidosis of lung: Secondary | ICD-10-CM | POA: Diagnosis not present

## 2019-10-20 DIAGNOSIS — J9611 Chronic respiratory failure with hypoxia: Secondary | ICD-10-CM

## 2019-10-20 DIAGNOSIS — J4530 Mild persistent asthma, uncomplicated: Secondary | ICD-10-CM | POA: Diagnosis not present

## 2019-10-20 MED ORDER — PREDNISONE 5 MG PO TABS: 7.5000 mg | ORAL_TABLET | Freq: Every day | ORAL | 3 refills | Status: DC

## 2019-10-20 NOTE — Progress Notes (Deleted)
[facility] [address], [address] [place], [place] [address] [phone]  Date:  10/24/2019   Name:  [name]   DOB:  November 02, 1950   MRN:  [id number]  PCP:  Pearline Cables, MD    Chief Complaint: No chief complaint on file.   History of Present Illness:  [name] is a 69 y.o. very pleasant female patient who presents with the following:  Here today for complete physical History of pulmonary sarcoidosis, asthma, spontaneous pneumothorax, migraine headache, GERD/IBS She uses home oxygen via nasal cannula Last seen by myself in June 2020 with postherpetic neuralgia, we added amitriptyline to her regimen to help  GI is Dr. Adela Lank Pulmonologist is Dr. Ardyth Man last saw her in December Current oxygen is 2 L at rest and 7 with exertion.  She is taking prednisone 10 daily  Mammogram is up-to-date DEXA up-to-date Cologuard up-to-date Immunizations complete  CMP and CBC done in October, she does need an A1c, lipid, thyroid  Patient Active Problem List   Diagnosis Date Noted  . Other chest pain   . Spontaneous pneumothorax 04/18/2017  . SOB (shortness of breath) 06/05/2016  . Osteopenia 02/12/2016  . Lumbar radiculopathy 01/26/2015  . Chronic respiratory failure with hypoxia (HCC) 04/12/2014  . Obesity (BMI 30-39.9) 02/08/2014  . Allergic rhinitis 09/05/2010  . IBS 09/05/2010  . GERD 02/18/2010  . Barrett's esophagus 02/12/2010  . Upper airway cough syndrome 11/02/2009  . Anxiety state 08/29/2008  . History of colonic polyps 08/29/2008  . PULMONARY SARCOIDOSIS 08/12/2007  . Mild persistent asthma in adult without complication with component of vcd 08/12/2007  . Diaphragmatic hernia 08/12/2007  . Migraine 08/12/2007    Past Medical History:  Diagnosis Date  . Anemia   . Anxiety   . Asthma   . Barrett esophagus   . Depression   . GERD (gastroesophageal reflux disease)   . Hiatal hernia   .  IBS (irritable bowel syndrome)   . Migraines   . Pneumothorax   . Pulmonary sarcoidosis (HCC)   . Supplemental oxygen dependent   . Wears dentures     Past Surgical History:  Procedure Laterality Date  . APPENDECTOMY    . BRAVO PH STUDY N/A 10/10/2013   Procedure: BRAVO PH STUDY;  Surgeon: Hart Carwin, MD;  Location: [facility];  Service: Endoscopy;  Laterality: N/A;  placed 29  . BRONCHIAL BIOPSY  1996  . CATARACT EXTRACTION, BILATERAL    . CHEST TUBE INSERTION Right 04/23/2017   Procedure: CHEST TUBE INSERTION;  Surgeon: [name], MD;  Location: [facility] OR;  Service: Thoracic;  Laterality: Right;  . CHOLECYSTECTOMY    . ESOPHAGOGASTRODUODENOSCOPY N/A 10/10/2013   Procedure: ESOPHAGOGASTRODUODENOSCOPY (EGD);  Surgeon: Hart Carwin, MD;  Location: [facility];  Service: Endoscopy;  Laterality: N/A;  pt asthmatic, RA 92% at rest.  Coughing frequently. Wheezes noted in upper lung fields at auscultation. Pt instructed to use home inhaler prior to procedure and placed on supportive O2 @ 2 lpm in pre-procedural. Teaching done ie. use of inhaler. Pt states she ju  . NISSEN FUNDOPLICATION    . ROTATOR CUFF REPAIR     x3(2 on L, 1 on R)  . VIDEO BRONCHOSCOPY WITH INSERTION OF INTERBRONCHIAL VALVE (IBV) N/A 05/08/2017   Procedure: VIDEO BRONCHOSCOPY WITH INSERTION OF INTERBRONCHIAL VALVE (IBV);  Surgeon: Loreli Slot, MD;  Location: [facility] OR;  Service: Thoracic;  Laterality: N/A;  .  VIDEO BRONCHOSCOPY WITH INSERTION OF INTERBRONCHIAL VALVE (IBV) N/A 07/10/2017   Procedure: VIDEO BRONCHOSCOPY FOR REMOVAL OF ENDOSCOPIC BRONCHIAL VALVE;  Surgeon: Ivin Poot, MD;  Location: [place];  Service: Thoracic;  Laterality: N/A;    Social History   Tobacco Use  . Smoking status: Former Smoker    Packs/day: 1.00    Years: 20.00    Pack years: 20.00    Types: Cigarettes    Quit date: 09/08/1978    Years since quitting: 41.1  . Smokeless tobacco: Never Used  Substance Use Topics  . Alcohol  use: No  . Drug use: No    Family History  Adopted: Yes    Allergies  Allergen Reactions  . Gabapentin Swelling    EDEMA WITH POSITIVE RECHALLENGE  . Lactose Intolerance (Gi) Diarrhea and Other (See Comments)    Stomach cramps  . Pineapple Hives and Itching  . Penicillins Other (See Comments)    Unknown - childhood allergy Has patient had a PCN reaction causing immediate rash, facial/tongue/throat swelling, SOB or lightheadedness with hypotension: Unknown Has patient had a PCN reaction causing severe rash involving mucus membranes or skin necrosis: Unknown Has patient had a PCN reaction that required hospitalization: Unknown Has patient had a PCN reaction occurring within the last 10 years: No If all of the above answers are "NO", then may proceed with Cephalosporin use.  . Aspirin Other (See Comments)    gastritis    Medication list has been reviewed and updated.  Current Outpatient Medications on File Prior to Visit  Medication Sig Dispense Refill  . ALOE VERA JUICE PO Take 120 mLs by mouth daily.     . Ascorbic Acid (VITAMIN C PO) Take 1 tablet by mouth daily. 1000 MG    . ASTRAGALUS PO Take 1 tablet by mouth daily.     . Calcium Carbonate-Vitamin D (CALCIUM-D PO) Take 1 tablet by mouth daily.    . Collagen Hydrolysate POWD Take 1 scoop by mouth daily with supper.     . Cyanocobalamin (VITAMIN B-12) 5000 MCG TBDP Take 5,000 mcg by mouth daily.    . cycloSPORINE (RESTASIS) 0.05 % ophthalmic emulsion Place 1 drop into both eyes 2 (two) times daily.     . Fluticasone-Umeclidin-Vilant (TRELEGY ELLIPTA) 100-62.5-25 MCG/INH AEPB Inhale 1 puff into the lungs daily. 28 each 5  . furosemide (LASIX) 20 MG tablet Take 1 tablet (20 mg total) by mouth daily. Use as needed for swelling 90 tablet 1  . ipratropium (ATROVENT HFA) 17 MCG/ACT inhaler Inhale 2 puffs into the lungs every 6 (six) hours as needed for wheezing. 1 Inhaler 1  . levocetirizine (XYZAL) 5 MG tablet Take 5 mg by mouth  every evening.    . Multiple Vitamin (MULTIVITAMIN WITH MINERALS) TABS tablet Take 1 tablet by mouth daily.    . Nasal Moisturizer Combination (RHINASE) SOLN Place 2 sprays into the nose 2 (two) times daily as needed (dry, bloody nostrils).     . Omega 3-6-9 Fatty Acids (OMEGA 3-6-9 COMPLEX PO) Take 1 capsule by mouth daily.    . OXYGEN Inhale 2-4 L into the lungs continuous. If moving around it is increased    . pantoprazole (PROTONIX) 40 MG tablet Take 1 tablet (40 mg total) by mouth 2 (two) times daily. 180 tablet 1  . polyvinyl alcohol (ARTIFICIAL TEARS) 1.4 % ophthalmic solution Place 1 drop into both eyes See admin instructions. Instill 1 drop in to both eyes once or twice daily as needed  for dry eyes - in between restasis doses    . PREBIOTIC PRODUCT PO Take 1 scoop by mouth daily before breakfast.     . predniSONE (DELTASONE) 10 MG tablet Take 1 tablet (10 mg total) by mouth daily with breakfast. 30 tablet 3  . Respiratory Therapy Supplies (FLUTTER) DEVI 1 application by Does not apply route 2 (two) times daily. 1 each 0  . sucralfate (CARAFATE) 1 GM/10ML suspension Take 10 mLs (1 g total) by mouth every 6 (six) hours as needed. 420 mL 3  . VENTOLIN HFA 108 (90 Base) MCG/ACT inhaler INHALE 2 PUFFS INTO THE LUNGS EVERY 4 (FOUR) HOURS AS NEEDED FOR WHEEZING OR SHORTNESS OF BREATH. 18 Inhaler 3   No current facility-administered medications on file prior to visit.    Review of Systems:  As per HPI- otherwise negative.   Physical Examination: There were no vitals filed for this visit. There were no vitals filed for this visit. There is no height or weight on file to calculate BMI. Ideal Body Weight:    GEN: no acute distress. HEENT: Atraumatic, Normocephalic.  Ears and Nose: No external deformity. CV: RRR, No M/G/R. No JVD. No thrill. No extra heart sounds. PULM: CTA B, no wheezes, crackles, rhonchi. No retractions. No resp. distress. No accessory muscle use. ABD: S, NT, ND, +BS.  No rebound. No HSM. EXTR: No c/c/e PSYCH: Normally interactive. Conversant.    Assessment and Plan: *** This visit occurred during the SARS-CoV-2 public health emergency.  Safety protocols were in place, including screening questions prior to the visit, additional usage of staff PPE, and extensive cleaning of exam room while observing appropriate contact time as indicated for disinfecting solutions.    Signed Abbe Amsterdam, MD

## 2019-10-20 NOTE — Patient Instructions (Signed)
Change prednisone to 7.5 mg daily  Follow up in 3 months

## 2019-10-20 NOTE — Telephone Encounter (Signed)
FYI Dr. Sood 

## 2019-10-20 NOTE — Progress Notes (Signed)
[facility]  Chief Complaint  Patient presents with  . Follow-up    Chronic respiratory failure with hypoxia (HCC)    Constitutional:  BP 132/80 (BP Location: Right Arm, Patient Position: Sitting, Cuff Size: Large)   Pulse (!) 115   Temp 97.6 F (36.4 C)   Ht 5\' 5"  (1.651 m)   Wt 235 lb 9.6 oz (106.9 kg)   SpO2 95% Comment: on 3L continuous  BMI 39.21 kg/m    Past Medical History:  Anemia, Anxiety, Barrett's Esophagus, Depression, HH, IBS, PTX  Brief Summary:  [name] is a 69 y.o. female former smoker with sarcoidosis, asthma, and chronic respiratory failure.  She feels that trelegy is working better than symbicort.  Gets winded quickly.  Concerned about her weight.  She is reluctant to go to pulmonary rehab during pandemic.  She is planning to do exercises at home.  She got 2nd COVID vaccine dose yesterday.  Not having cough, wheeze, sputum.  Still gets leg swelling and uses lasix prn.   Physical Exam:   Appearance - wearing oxygen, in wheelchair  ENMT - no sinus tenderness, no nasal discharge, no oral exudate  Neck - no masses, trachea midline, no thyromegaly, no elevation in JVP  Respiratory - b/l basilar crackles, no wheeze  CV - s1s2 regular rate and rhythm, no murmurs, no peripheral edema, radial pulses symmetric  Ext - no cyanosis, clubbing, or joint inflammation noted  Psych - normal mood and affect   Assessment/Plan:   Pulmonary sarcoidosis. - unstable - she has advanced disease - explained that much of her symptom status will likely be chronic - will try change prednisone to 7.5 mg daily - she will try mild exercise program at home  Persistent asthma. - continue trelegy - prn albuterol, saline, flutter valve  Chronic respiratory failure with hypoxia. - unstable - 2 to 3 liters at rest - she uses 7 liters with exertion at home - can only use 5 liters pulsed with POC when she goes out; other  options are either too bulky or don't last long enough  Leg edema. - prn lasix  COVID 19 advice. - discussed side effects to monitor for after 2nd COVID vaccine   Patient Instructions  Change prednisone to 7.5 mg daily  Follow up in 3 months    Chesley Mires, MD [facility] Pager: [phone] 10/20/2019, 10:25 AM  Flow Sheet    Pulmonary tests:  PFT 08/14/16 >> 1.60 (88%), FEV1% 87, TLC 3.34 (68%), DLCO 35%, +BD  Serology:  ACE 07/08/19 >> 67  Chest imaging:  CT chest 09/20/17 >> subpleural reticulation, distortion, honeycombing b/l HRCT chest 07/20/19 >> atherosclerosis, borderline LAN, extensive cylindrical and varicose BTX, severe peribronchovascular thickening, extensive asymmetric honeycombing  Cardiac tests:  Echo 08/14/16 >> EF 60 to 65%, grade 1 DD, PAS 30 mmHg  Medications:   Allergies as of 10/20/2019      Reactions   Gabapentin Swelling   EDEMA WITH POSITIVE RECHALLENGE   Lactose Intolerance (gi) Diarrhea, Other (See Comments)   Stomach cramps   Pineapple Hives, Itching   Penicillins Other (See Comments)   Unknown - childhood allergy Has patient had a PCN reaction causing immediate rash, facial/tongue/throat swelling, SOB or lightheadedness with hypotension: Unknown Has patient had a PCN reaction causing severe rash involving mucus membranes or skin necrosis: Unknown Has patient had a PCN reaction that required hospitalization: Unknown Has patient had a PCN reaction occurring within the last  10 years: No If all of the above answers are "NO", then may proceed with Cephalosporin use.   Aspirin Other (See Comments)   gastritis      Medication List       Accurate as of October 20, 2019 10:25 AM. If you have any questions, ask your nurse or doctor.        ALOE VERA JUICE PO Take 120 mLs by mouth daily.   Artificial Tears 1.4 % ophthalmic solution Generic drug: polyvinyl alcohol Place 1 drop into both eyes See admin  instructions. Instill 1 drop in to both eyes once or twice daily as needed for dry eyes - in between restasis doses   ASTRAGALUS PO Take 1 tablet by mouth daily.   CALCIUM-D PO Take 1 tablet by mouth daily.   Collagen Hydrolysate Powd Take 1 scoop by mouth daily with supper.   cycloSPORINE 0.05 % ophthalmic emulsion Commonly known as: RESTASIS Place 1 drop into both eyes 2 (two) times daily.   Flutter Devi 1 application by Does not apply route 2 (two) times daily.   furosemide 20 MG tablet Commonly known as: LASIX Take 1 tablet (20 mg total) by mouth daily. Use as needed for swelling   ipratropium 17 MCG/ACT inhaler Commonly known as: ATROVENT HFA Inhale 2 puffs into the lungs every 6 (six) hours as needed for wheezing.   multivitamin with minerals Tabs tablet Take 1 tablet by mouth daily.   OMEGA 3-6-9 COMPLEX PO Take 1 capsule by mouth daily.   OXYGEN Inhale 2-4 L into the lungs continuous. If moving around it is increased   pantoprazole 40 MG tablet Commonly known as: PROTONIX Take 1 tablet (40 mg total) by mouth 2 (two) times daily.   PREBIOTIC PRODUCT PO Take 1 scoop by mouth daily before breakfast.   predniSONE 5 MG tablet Commonly known as: DELTASONE Take 1.5 tablets (7.5 mg total) by mouth daily with breakfast. What changed:   medication strength  how much to take Changed by: Coralyn Helling, MD   Rhinase Soln Place 2 sprays into the nose 2 (two) times daily as needed (dry, bloody nostrils).   sucralfate 1 GM/10ML suspension Commonly known as: CARAFATE Take 10 mLs (1 g total) by mouth every 6 (six) hours as needed.   Trelegy Ellipta 100-62.5-25 MCG/INH Aepb Generic drug: Fluticasone-Umeclidin-Vilant Inhale 1 puff into the lungs daily.   Ventolin HFA 108 (90 Base) MCG/ACT inhaler Generic drug: albuterol INHALE 2 PUFFS INTO THE LUNGS EVERY 4 (FOUR) HOURS AS NEEDED FOR WHEEZING OR SHORTNESS OF BREATH.   Vitamin B-12 5000 MCG Tbdp Take 5,000 mcg by  mouth daily.   VITAMIN C PO Take 1 tablet by mouth daily. 1000 MG   Xyzal 5 MG tablet Generic drug: levocetirizine Take 5 mg by mouth every evening.       Past Surgical History:  She  has a past surgical history that includes Cholecystectomy; Rotator cuff repair; Cataract extraction, bilateral; Bronchial biopsy (1996); Nissen fundoplication; BRAVO ph study (N/A, 10/10/2013); Esophagogastroduodenoscopy (N/A, 10/10/2013); Chest tube insertion (Right, 04/23/2017); Video bronchoscopy with insertion of interbronchial valve (ibv) (N/A, 05/08/2017); Appendectomy; and Video bronchoscopy with insertion of interbronchial valve (ibv) (N/A, 07/10/2017).  Family History:  Her family history is not on file. She was adopted.  Social History:  She  reports that she quit smoking about 41 years ago. Her smoking use included cigarettes. She has a 20.00 pack-year smoking history. She has never used smokeless tobacco. She reports that she does not  drink alcohol or use drugs.

## 2019-10-24 ENCOUNTER — Ambulatory Visit (INDEPENDENT_AMBULATORY_CARE_PROVIDER_SITE_OTHER): Payer: Medicare Other | Admitting: Family Medicine

## 2019-10-24 DIAGNOSIS — Z13220 Encounter for screening for lipoid disorders: Secondary | ICD-10-CM | POA: Diagnosis not present

## 2019-10-24 DIAGNOSIS — D869 Sarcoidosis, unspecified: Secondary | ICD-10-CM

## 2019-10-24 DIAGNOSIS — Z131 Encounter for screening for diabetes mellitus: Secondary | ICD-10-CM | POA: Diagnosis not present

## 2019-10-24 DIAGNOSIS — Z0000 Encounter for general adult medical examination without abnormal findings: Secondary | ICD-10-CM

## 2019-10-24 DIAGNOSIS — J4530 Mild persistent asthma, uncomplicated: Secondary | ICD-10-CM

## 2019-10-24 DIAGNOSIS — Z1329 Encounter for screening for other suspected endocrine disorder: Secondary | ICD-10-CM

## 2019-10-26 ENCOUNTER — Telehealth: Payer: Self-pay | Admitting: Pulmonary Disease

## 2019-10-26 DIAGNOSIS — J45909 Unspecified asthma, uncomplicated: Secondary | ICD-10-CM | POA: Diagnosis not present

## 2019-10-26 DIAGNOSIS — D869 Sarcoidosis, unspecified: Secondary | ICD-10-CM | POA: Diagnosis not present

## 2019-10-26 NOTE — Telephone Encounter (Signed)
Spoke with pt. She is requesting a refill on Spiriva. Trelegy is listed on medication list, not Spiriva. States that she takes Spiriva for her cough.  Dr. Craige Cotta - please advise. Thanks.

## 2019-10-26 NOTE — Telephone Encounter (Signed)
Pt returned call. Informed her of the recs per VS. Pt aware to only take the Trelegy as it is a triple therapy inhaler. Pt aware to stop taking the Spiriva and to only take Trelegy and to rinse her mouth out after each use. Pt verbalized understanding and denied any further questions or concerns at this time.

## 2019-10-26 NOTE — Telephone Encounter (Signed)
LMTCB x1 for pt.  

## 2019-10-26 NOTE — Telephone Encounter (Signed)
Please let her know that trelegy has a similar medication to spiriva, and therefore doesn't need spiriva if she is using trelegy.  She should use albuterol when she gets episodes of coughing.

## 2019-10-29 NOTE — Progress Notes (Deleted)
[facility] [address], [address] [place], [place] [address] [phone]  Date:  10/31/2019   Name:  [name]   DOB:  1950-11-17   MRN:  [id number]  PCP:  Pearline Cables, MD    Chief Complaint: No chief complaint on file.   History of Present Illness:  [name] is a 69 y.o. very pleasant female patient who presents with the following:  Here today for routine physical History of pulmonary sarcoidosis with persistent shortness of breath, spontaneous pneumothorax, GERD, IBS, osteopenia, oxygen use Pulmonologist is Dr. Corliss Skains visit with him earlier this month.  That time she was noted to have advanced pulmonary sarcoidosis, on persistent prednisone, Trelegy inhaler, oxygen at home-her oxygen needs have been gradually increasing  Last seen by myself in June 2020 for virtual visit-postherpetic neuralgia following shingles  Mammogram up-to-date Cologuard up-to-date DEXA scan November 2020 Second dose of Shingrix? Immunizations otherwise up-to-date CMP, CBC done in October  She does need a lipid panel and A1c, TSH   Patient Active Problem List   Diagnosis Date Noted  . Other chest pain   . Spontaneous pneumothorax 04/18/2017  . SOB (shortness of breath) 06/05/2016  . Osteopenia 02/12/2016  . Lumbar radiculopathy 01/26/2015  . Chronic respiratory failure with hypoxia (HCC) 04/12/2014  . Obesity (BMI 30-39.9) 02/08/2014  . Allergic rhinitis 09/05/2010  . IBS 09/05/2010  . GERD 02/18/2010  . Barrett's esophagus 02/12/2010  . Upper airway cough syndrome 11/02/2009  . Anxiety state 08/29/2008  . History of colonic polyps 08/29/2008  . PULMONARY SARCOIDOSIS 08/12/2007  . Mild persistent asthma in adult without complication with component of vcd 08/12/2007  . Diaphragmatic hernia 08/12/2007  . Migraine 08/12/2007    Past Medical History:  Diagnosis Date  . Anemia   . Anxiety   . Asthma   .  Barrett esophagus   . Depression   . GERD (gastroesophageal reflux disease)   . Hiatal hernia   . IBS (irritable bowel syndrome)   . Migraines   . Pneumothorax   . Pulmonary sarcoidosis (HCC)   . Supplemental oxygen dependent   . Wears dentures     Past Surgical History:  Procedure Laterality Date  . APPENDECTOMY    . BRAVO PH STUDY N/A 10/10/2013   Procedure: BRAVO PH STUDY;  Surgeon: Hart Carwin, MD;  Location: [facility];  Service: Endoscopy;  Laterality: N/A;  placed 27  . BRONCHIAL BIOPSY  1996  . CATARACT EXTRACTION, BILATERAL    . CHEST TUBE INSERTION Right 04/23/2017   Procedure: CHEST TUBE INSERTION;  Surgeon: [name], MD;  Location: [facility] OR;  Service: Thoracic;  Laterality: Right;  . CHOLECYSTECTOMY    . ESOPHAGOGASTRODUODENOSCOPY N/A 10/10/2013   Procedure: ESOPHAGOGASTRODUODENOSCOPY (EGD);  Surgeon: Hart Carwin, MD;  Location: [facility];  Service: Endoscopy;  Laterality: N/A;  pt asthmatic, RA 92% at rest.  Coughing frequently. Wheezes noted in upper lung fields at auscultation. Pt instructed to use home inhaler prior to procedure and placed on supportive O2 @ 2 lpm in pre-procedural. Teaching done ie. use of inhaler. Pt states she ju  . NISSEN FUNDOPLICATION    . ROTATOR CUFF REPAIR     x3(2 on L, 1 on R)  . VIDEO BRONCHOSCOPY WITH INSERTION OF INTERBRONCHIAL VALVE (IBV) N/A 05/08/2017   Procedure: VIDEO BRONCHOSCOPY WITH INSERTION OF INTERBRONCHIAL VALVE (IBV);  Surgeon: Loreli Slot, MD;  Location: [facility] OR;  Service: Thoracic;  Laterality: N/A;  . VIDEO BRONCHOSCOPY WITH INSERTION OF INTERBRONCHIAL VALVE (IBV) N/A 07/10/2017   Procedure: VIDEO BRONCHOSCOPY FOR REMOVAL OF ENDOSCOPIC BRONCHIAL VALVE;  Surgeon: [name], MD;  Location: [facility] OR;  Service: Thoracic;  Laterality: N/A;    Social History   Tobacco Use  . Smoking status: Former Smoker    Packs/day: 1.00    Years: 20.00    Pack years: 20.00    Types: Cigarettes    Quit date:  09/08/1978    Years since quitting: 41.1  . Smokeless tobacco: Never Used  Substance Use Topics  . Alcohol use: No  . Drug use: No    Family History  Adopted: Yes    Allergies  Allergen Reactions  . Gabapentin Swelling    EDEMA WITH POSITIVE RECHALLENGE  . Lactose Intolerance (Gi) Diarrhea and Other (See Comments)    Stomach cramps  . Pineapple Hives and Itching  . Penicillins Other (See Comments)    Unknown - childhood allergy Has patient had a PCN reaction causing immediate rash, facial/tongue/throat swelling, SOB or lightheadedness with hypotension: Unknown Has patient had a PCN reaction causing severe rash involving mucus membranes or skin necrosis: Unknown Has patient had a PCN reaction that required hospitalization: Unknown Has patient had a PCN reaction occurring within the last 10 years: No If all of the above answers are "NO", then may proceed with Cephalosporin use.  . Aspirin Other (See Comments)    gastritis    Medication list has been reviewed and updated.  Current Outpatient Medications on File Prior to Visit  Medication Sig Dispense Refill  . ALOE VERA JUICE PO Take 120 mLs by mouth daily.     . Ascorbic Acid (VITAMIN C PO) Take 1 tablet by mouth daily. 1000 MG    . ASTRAGALUS PO Take 1 tablet by mouth daily.     . Calcium Carbonate-Vitamin D (CALCIUM-D PO) Take 1 tablet by mouth daily.    . Collagen Hydrolysate POWD Take 1 scoop by mouth daily with supper.     . Cyanocobalamin (VITAMIN B-12) 5000 MCG TBDP Take 5,000 mcg by mouth daily.    . cycloSPORINE (RESTASIS) 0.05 % ophthalmic emulsion Place 1 drop into both eyes 2 (two) times daily.     . Fluticasone-Umeclidin-Vilant (TRELEGY ELLIPTA) 100-62.5-25 MCG/INH AEPB Inhale 1 puff into the lungs daily. 28 each 5  . furosemide (LASIX) 20 MG tablet Take 1 tablet (20 mg total) by mouth daily. Use as needed for swelling 90 tablet 1  . ipratropium (ATROVENT HFA) 17 MCG/ACT inhaler Inhale 2 puffs into the lungs every  6 (six) hours as needed for wheezing. 1 Inhaler 1  . levocetirizine (XYZAL) 5 MG tablet Take 5 mg by mouth every evening.    . Multiple Vitamin (MULTIVITAMIN WITH MINERALS) TABS tablet Take 1 tablet by mouth daily.    . Nasal Moisturizer Combination (RHINASE) SOLN Place 2 sprays into the nose 2 (two) times daily as needed (dry, bloody nostrils).     . Omega 3-6-9 Fatty Acids (OMEGA 3-6-9 COMPLEX PO) Take 1 capsule by mouth daily.    . OXYGEN Inhale 2-4 L into the lungs continuous. If moving around it is increased    . pantoprazole (PROTONIX) 40 MG tablet Take 1 tablet (40 mg total) by mouth 2 (two) times daily. 180 tablet 1  . polyvinyl alcohol (ARTIFICIAL TEARS) 1.4 % ophthalmic solution Place 1 drop into both eyes See admin instructions. Instill 1 drop in to both  eyes once or twice daily as needed for dry eyes - in between restasis doses    . PREBIOTIC PRODUCT PO Take 1 scoop by mouth daily before breakfast.     . predniSONE (DELTASONE) 5 MG tablet Take 1.5 tablets (7.5 mg total) by mouth daily with breakfast. 45 tablet 3  . Respiratory Therapy Supplies (FLUTTER) DEVI 1 application by Does not apply route 2 (two) times daily. 1 each 0  . sucralfate (CARAFATE) 1 GM/10ML suspension Take 10 mLs (1 g total) by mouth every 6 (six) hours as needed. 420 mL 3  . VENTOLIN HFA 108 (90 Base) MCG/ACT inhaler INHALE 2 PUFFS INTO THE LUNGS EVERY 4 (FOUR) HOURS AS NEEDED FOR WHEEZING OR SHORTNESS OF BREATH. 18 Inhaler 3   No current facility-administered medications on file prior to visit.    Review of Systems:  As per HPI- otherwise negative.   Physical Examination: There were no vitals filed for this visit. There were no vitals filed for this visit. There is no height or weight on file to calculate BMI. Ideal Body Weight:    GEN: no acute distress. HEENT: Atraumatic, Normocephalic.  Ears and Nose: No external deformity. CV: RRR, No M/G/R. No JVD. No thrill. No extra heart sounds. PULM: CTA B, no  wheezes, crackles, rhonchi. No retractions. No resp. distress. No accessory muscle use. ABD: S, NT, ND, +BS. No rebound. No HSM. EXTR: No c/c/e PSYCH: Normally interactive. Conversant.    Assessment and Plan: Screening for hyperlipidemia  Screening for diabetes mellitus  Screening for thyroid disorder  Physical exam  This visit occurred during the SARS-CoV-2 public health emergency.  Safety protocols were in place, including screening questions prior to the visit, additional usage of staff PPE, and extensive cleaning of exam room while observing appropriate contact time as indicated for disinfecting solutions.     Signed Lamar Blinks, MD

## 2019-10-30 ENCOUNTER — Encounter: Payer: Self-pay | Admitting: Family Medicine

## 2019-10-31 ENCOUNTER — Ambulatory Visit (INDEPENDENT_AMBULATORY_CARE_PROVIDER_SITE_OTHER): Payer: Medicare Other | Admitting: Family Medicine

## 2019-10-31 DIAGNOSIS — Z13220 Encounter for screening for lipoid disorders: Secondary | ICD-10-CM | POA: Diagnosis not present

## 2019-10-31 DIAGNOSIS — Z1329 Encounter for screening for other suspected endocrine disorder: Secondary | ICD-10-CM

## 2019-10-31 DIAGNOSIS — D869 Sarcoidosis, unspecified: Secondary | ICD-10-CM | POA: Diagnosis not present

## 2019-10-31 DIAGNOSIS — Z131 Encounter for screening for diabetes mellitus: Secondary | ICD-10-CM | POA: Diagnosis not present

## 2019-10-31 DIAGNOSIS — Z0000 Encounter for general adult medical examination without abnormal findings: Secondary | ICD-10-CM | POA: Diagnosis not present

## 2019-11-07 ENCOUNTER — Other Ambulatory Visit: Payer: Self-pay | Admitting: General Surgery

## 2019-11-07 DIAGNOSIS — D869 Sarcoidosis, unspecified: Secondary | ICD-10-CM

## 2019-11-07 DIAGNOSIS — D860 Sarcoidosis of lung: Secondary | ICD-10-CM

## 2019-11-07 DIAGNOSIS — J4530 Mild persistent asthma, uncomplicated: Secondary | ICD-10-CM

## 2019-11-07 DIAGNOSIS — J9611 Chronic respiratory failure with hypoxia: Secondary | ICD-10-CM

## 2019-11-07 MED ORDER — TRELEGY ELLIPTA 100-62.5-25 MCG/INH IN AEPB: 1.0000 | INHALATION_SPRAY | Freq: Every day | RESPIRATORY_TRACT | 6 refills | Status: DC

## 2019-11-24 NOTE — Progress Notes (Signed)
Nurse connected with patient 11/25/19 at  1:45 PM EDT by a telephone enabled telemedicine application and verified that I am speaking with the correct person using two identifiers. Patient stated full name and DOB. Patient gave permission to continue with virtual visit. Patient's location was at home and Nurse's location was at [place] office.   Subjective:   [name] is a 69 y.o. female who presents for Medicare Annual (Subsequent) preventive examination.  Meets daily on zoom for [employer].   Review of Systems:  Home Safety/Smoke Alarms: Feels safe in home. Smoke alarms in place.  Wears 02 @2L  via [place] at all times.  Lives w/ husband in 1 story home.   Female:      Mammo-07/20/19       Dexa scan- 06/30/19       CCS-Cologuard 10/27/18. Negative     Objective:     Vitals: Unable to assess. This visit is enabled though telemedicine due to Covid 19.   Advanced Directives 11/25/2019 02/01/2019 07/17/2017 07/10/2017 07/09/2017 06/05/2017 05/21/2017  Does Patient Have a Medical Advance Directive? No No No No No Yes No  Type of Advance Directive - - - - - 05/23/2017;Living will -  Would patient like information on creating a medical advance directive? No - Patient declined No - Patient declined No - Patient declined - Yes (MAU/Ambulatory/Procedural Areas - Information given) - -  Pre-existing out of facility DNR order (yellow form or pink MOST form) - - - - - - -    Tobacco Social History   Tobacco Use  Smoking Status Former Smoker  . Packs/day: 1.00  . Years: 20.00  . Pack years: 20.00  . Types: Cigarettes  . Quit date: 09/08/1978  . Years since quitting: 41.2  Smokeless Tobacco Never Used     Counseling given: Not Answered   Clinical Intake: Pain : No/denies pain    Past Medical History:  Diagnosis Date  . Anemia   . Anxiety   . Asthma   . Barrett esophagus   . Depression   . GERD (gastroesophageal reflux disease)   . Hiatal hernia     . IBS (irritable bowel syndrome)   . Migraines   . Pneumothorax   . Pulmonary sarcoidosis (HCC)   . Supplemental oxygen dependent   . Wears dentures    Past Surgical History:  Procedure Laterality Date  . APPENDECTOMY    . BRAVO PH STUDY N/A 10/10/2013   Procedure: BRAVO PH STUDY;  Surgeon: 12/08/2013, MD;  Location: [facility];  Service: Endoscopy;  Laterality: N/A;  placed 27  . BRONCHIAL BIOPSY  1996  . CATARACT EXTRACTION, BILATERAL    . CHEST TUBE INSERTION Right 04/23/2017   Procedure: CHEST TUBE INSERTION;  Surgeon: 04/25/2017, MD;  Location: [facility] OR;  Service: Thoracic;  Laterality: Right;  . CHOLECYSTECTOMY    . ESOPHAGOGASTRODUODENOSCOPY N/A 10/10/2013   Procedure: ESOPHAGOGASTRODUODENOSCOPY (EGD);  Surgeon: 12/08/2013, MD;  Location: [facility];  Service: Endoscopy;  Laterality: N/A;  pt asthmatic, RA 92% at rest.  Coughing frequently. Wheezes noted in upper lung fields at auscultation. Pt instructed to use home inhaler prior to procedure and placed on supportive O2 @ 2 lpm in pre-procedural. Teaching done ie. use of inhaler. Pt states she ju  . NISSEN FUNDOPLICATION    . ROTATOR CUFF REPAIR     x3(2 on L, 1 on R)  . VIDEO BRONCHOSCOPY WITH INSERTION OF INTERBRONCHIAL VALVE (  IBV) N/A 05/08/2017   Procedure: VIDEO BRONCHOSCOPY WITH INSERTION OF INTERBRONCHIAL VALVE (IBV);  Surgeon: Loreli Slot, MD;  Location: [facility] OR;  Service: Thoracic;  Laterality: N/A;  . VIDEO BRONCHOSCOPY WITH INSERTION OF INTERBRONCHIAL VALVE (IBV) N/A 07/10/2017   Procedure: VIDEO BRONCHOSCOPY FOR REMOVAL OF ENDOSCOPIC BRONCHIAL VALVE;  Surgeon: [name], MD;  Location: [facility] OR;  Service: Thoracic;  Laterality: N/A;   Family History  Adopted: Yes   Social History   Socioeconomic History  . Marital status: Married    Spouse name: Not on file  . Number of children: Not on file  . Years of education: Not on file  . Highest education level: Not on file  Occupational History   . Occupation: Semi RetiredWater engineer at [employer]  . Smoking status: Former Smoker    Packs/day: 1.00    Years: 20.00    Pack years: 20.00    Types: Cigarettes    Quit date: 09/08/1978    Years since quitting: 41.2  . Smokeless tobacco: Never Used  Substance and Sexual Activity  . Alcohol use: No  . Drug use: No  . Sexual activity: Yes  Other Topics Concern  . Not on file  Social History Narrative  . Not on file   Social Determinants of Health   Financial Resource Strain: Low Risk   . Difficulty of Paying Living Expenses: Not hard at all  Food Insecurity: No Food Insecurity  . Worried About Programme researcher, broadcasting/film/video in the Last Year: Never true  . Ran Out of Food in the Last Year: Never true  Transportation Needs: No Transportation Needs  . Lack of Transportation (Medical): No  . Lack of Transportation (Non-Medical): No  Physical Activity:   . Days of Exercise per Week:   . Minutes of Exercise per Session:   Stress:   . Feeling of Stress :   Social Connections:   . Frequency of Communication with Friends and Family:   . Frequency of Social Gatherings with Friends and Family:   . Attends Religious Services:   . Active Member of Clubs or Organizations:   . Attends Banker Meetings:   Marland Kitchen Marital Status:     Outpatient Encounter Medications as of 11/25/2019  Medication Sig  . ALOE VERA JUICE PO Take 120 mLs by mouth daily.   . Ascorbic Acid (VITAMIN C PO) Take 1 tablet by mouth daily. 1000 MG  . ASTRAGALUS PO Take 1 tablet by mouth daily.   . Calcium Carbonate-Vitamin D (CALCIUM-D PO) Take 1 tablet by mouth daily.  . Collagen Hydrolysate POWD Take 1 scoop by mouth daily with supper.   . Cyanocobalamin (VITAMIN B-12) 5000 MCG TBDP Take 5,000 mcg by mouth daily.  . cycloSPORINE (RESTASIS) 0.05 % ophthalmic emulsion Place 1 drop into both eyes 2 (two) times daily.   . Fluticasone-Umeclidin-Vilant (TRELEGY ELLIPTA) 100-62.5-25 MCG/INH AEPB Inhale 1  puff into the lungs daily.  . furosemide (LASIX) 20 MG tablet Take 1 tablet (20 mg total) by mouth daily. Use as needed for swelling  . ipratropium (ATROVENT HFA) 17 MCG/ACT inhaler Inhale 2 puffs into the lungs every 6 (six) hours as needed for wheezing.  Marland Kitchen levocetirizine (XYZAL) 5 MG tablet Take 5 mg by mouth every evening.  . Multiple Vitamin (MULTIVITAMIN WITH MINERALS) TABS tablet Take 1 tablet by mouth daily.  . Nasal Moisturizer Combination (RHINASE) SOLN Place 2 sprays into the nose 2 (two) times daily as needed (dry,  bloody nostrils).   . Omega 3-6-9 Fatty Acids (OMEGA 3-6-9 COMPLEX PO) Take 1 capsule by mouth daily.  . OXYGEN Inhale 2-4 L into the lungs continuous. If moving around it is increased  . pantoprazole (PROTONIX) 40 MG tablet Take 1 tablet (40 mg total) by mouth 2 (two) times daily.  . polyvinyl alcohol (ARTIFICIAL TEARS) 1.4 % ophthalmic solution Place 1 drop into both eyes See admin instructions. Instill 1 drop in to both eyes once or twice daily as needed for dry eyes - in between restasis doses  . PREBIOTIC PRODUCT PO Take 1 scoop by mouth daily before breakfast.   . predniSONE (DELTASONE) 5 MG tablet Take 1.5 tablets (7.5 mg total) by mouth daily with breakfast.  . Respiratory Therapy Supplies (FLUTTER) DEVI 1 application by Does not apply route 2 (two) times daily.  . sucralfate (CARAFATE) 1 GM/10ML suspension Take 10 mLs (1 g total) by mouth every 6 (six) hours as needed.  . VENTOLIN HFA 108 (90 Base) MCG/ACT inhaler INHALE 2 PUFFS INTO THE LUNGS EVERY 4 (FOUR) HOURS AS NEEDED FOR WHEEZING OR SHORTNESS OF BREATH.   No facility-administered encounter medications on file as of 11/25/2019.    Activities of Daily Living In your present state of health, do you have any difficulty performing the following activities: 11/25/2019  Hearing? N  Vision? N  Difficulty concentrating or making decisions? N  Walking or climbing stairs? N  Dressing or bathing? N  Doing errands,  shopping? N  Preparing Food and eating ? N  Using the Toilet? N  In the past six months, have you accidently leaked urine? N  Do you have problems with loss of bowel control? N  Managing your Medications? N  Managing your Finances? N  Housekeeping or managing your Housekeeping? N  Some recent data might be hidden    Patient Care Team: Copland, Gwenlyn Found, MD as PCP - General (Family Medicine) Nelson Chimes, MD as Consulting Physician (Ophthalmology) Armbruster, Willaim Rayas, MD as Consulting Physician (Gastroenterology) Newman Pies, MD as Consulting Physician (Otolaryngology) Oretha Milch, MD as Consulting Physician (Pulmonary Disease)    Assessment:   This is a routine wellness examination for [place]. Physical assessment deferred to PCP.' Exercise Activities and Dietary recommendations Current Exercise Habits: The patient does not participate in regular exercise at present, Exercise limited by: respiratory conditions(s) Diet (meal preparation, eat out, water intake, caffeinated beverages, dairy products, fruits and vegetables): well balanced   Goals    . Increase physical activity     As tolerated. Begin chair exercises as tolerated.    . Patient Stated     Get back to normal weight of 145-150 lbs.    . Weight (lb) < 170 lb (77.1 kg)     Do chair exercises and eat healthy        Fall Risk Fall Risk  11/25/2019 09/27/2018 09/24/2017 07/17/2017 09/03/2016  Falls in the past year? 0 0 No No No  Comment - - - - -  Number falls in past yr: 0 - - - -  Injury with Fall? 0 - - - -  Risk for fall due to : - - - - -  Risk for fall due to: Comment - - - - -  Follow up Education provided;Falls prevention discussed - - - -   Depression Screen PHQ 2/9 Scores 11/25/2019 09/27/2018 09/24/2017 07/17/2017  PHQ - 2 Score 1 0 0 0     Cognitive Function Ad8 score reviewed for  issues:  Issues making decisions:no  Less interest in hobbies / activities:no  Repeats questions, stories (family  complaining):no  Trouble using ordinary gadgets (microwave, computer, phone):no  Forgets the month or year: no  Mismanaging finances: no  Remembering appts:no  Daily problems with thinking and/or memory:no Ad8 score is=0  MMSE - Mini Mental State Exam 07/17/2017 03/04/2016  Orientation to time 5 5  Orientation to Place 5 5  Registration 3 3  Attention/ Calculation 5 5  Recall 1 3  Language- name 2 objects 2 2  Language- repeat 1 1  Language- follow 3 step command 3 3  Language- read & follow direction 1 1  Write a sentence 1 1  Copy design 1 1  Total score 28 30        Immunization History  Administered Date(s) Administered  . Fluad Quad(high Dose 65+) 06/30/2019  . Influenza Split 07/29/2012  . Influenza Whole 07/09/2002, 07/09/2009, 06/20/2010  . Influenza, High Dose Seasonal PF 06/03/2016, 06/17/2018  . Influenza,inj,Quad PF,6+ Mos 07/11/2013, 06/29/2014  . PFIZER SARS-COV-2 Vaccination 09/28/2019, 10/19/2019  . Pneumococcal Conjugate-13 06/29/2014  . Pneumococcal Polysaccharide-23 07/09/2002, 12/14/2009, 03/04/2016  . Td 09/05/2010  . Zoster Recombinat (Shingrix) 06/30/2019    Screening Tests Health Maintenance  Topic Date Due  . TETANUS/TDAP  09/05/2020  . MAMMOGRAM  07/18/2021  . Fecal DNA (Cologuard)  10/28/2021  . INFLUENZA VACCINE  Completed  . DEXA SCAN  Completed  . Hepatitis C Screening  Completed  . PNA vac Low Risk Adult  Completed     Plan:    Please schedule your next medicare wellness visit with me in 1 yr.  Continue to eat heart healthy diet (full of fruits, vegetables, whole grains, lean protein, water--limit salt, fat, and sugar intake) and increase physical activity as tolerated.  Continue doing brain stimulating activities (puzzles, reading, adult coloring books, staying active) to keep memory sharp.    I have personally reviewed and noted the following in the patient's chart:   . Medical and social history . Use of alcohol,  tobacco or illicit drugs  . Current medications and supplements . Functional ability and status . Nutritional status . Physical activity . Advanced directives . List of other physicians . Hospitalizations, surgeries, and ER visits in previous 12 months . Vitals . Screenings to include cognitive, depression, and falls . Referrals and appointments  In addition, I have reviewed and discussed with patient certain preventive protocols, quality metrics, and best practice recommendations. A written personalized care plan for preventive services as well as general preventive health recommendations were provided to patient.     Shela Nevin, [place]  11/25/2019  PCP Note: Pt states she is planning to move to [place] by May and would like to discuss MD referral.

## 2019-11-25 ENCOUNTER — Other Ambulatory Visit: Payer: Self-pay

## 2019-11-25 ENCOUNTER — Ambulatory Visit: Payer: Medicare Other | Admitting: *Deleted

## 2019-11-25 ENCOUNTER — Encounter: Payer: Self-pay | Admitting: *Deleted

## 2019-11-25 DIAGNOSIS — Z0000 Encounter for general adult medical examination without abnormal findings: Secondary | ICD-10-CM

## 2019-11-25 NOTE — Patient Instructions (Signed)
Please schedule your next medicare wellness visit with me in 1 yr.  Continue to eat heart healthy diet (full of fruits, vegetables, whole grains, lean protein, water--limit salt, fat, and sugar intake) and increase physical activity as tolerated.  Continue doing brain stimulating activities (puzzles, reading, adult coloring books, staying active) to keep memory sharp.    [name] , Thank you for taking time to come for your Medicare Wellness Visit. I appreciate your ongoing commitment to your health goals. Please review the following plan we discussed and let me know if I can assist you in the future.   These are the goals we discussed: Goals    . Increase physical activity     As tolerated. Begin chair exercises as tolerated.    . Patient Stated     Get back to normal weight of 145-150 lbs.    . Weight (lb) < 170 lb (77.1 kg)     Do chair exercises and eat healthy        This is a list of the screening recommended for you and due dates:  Health Maintenance  Topic Date Due  . Tetanus Vaccine  09/05/2020  . Mammogram  07/18/2021  . Cologuard (Stool DNA test)  10/28/2021  . Flu Shot  Completed  . DEXA scan (bone density measurement)  Completed  .  Hepatitis C: One time screening is recommended by Center for Disease Control  (CDC) for  adults born from [birth year] through 1967.   Completed  . Pneumonia vaccines  Completed    Preventive Care 25 Years and Older, Female Preventive care refers to lifestyle choices and visits with your health care provider that can promote health and wellness. This includes:  A yearly physical exam. This is also called an annual well check.  Regular dental and eye exams.  Immunizations.  Screening for certain conditions.  Healthy lifestyle choices, such as diet and exercise. What can I expect for my preventive care visit? Physical exam Your health care provider will check:  Height and weight. These may be used to calculate body mass index  (BMI), which is a measurement that tells if you are at a healthy weight.  Heart rate and blood pressure.  Your skin for abnormal spots. Counseling Your health care provider may ask you questions about:  Alcohol, tobacco, and drug use.  Emotional well-being.  Home and relationship well-being.  Sexual activity.  Eating habits.  History of falls.  Memory and ability to understand (cognition).  Work and work Statistician.  Pregnancy and menstrual history. What immunizations do I need?  Influenza (flu) vaccine  This is recommended every year. Tetanus, diphtheria, and pertussis (Tdap) vaccine  You may need a Td booster every 10 years. Varicella (chickenpox) vaccine  You may need this vaccine if you have not already been vaccinated. Zoster (shingles) vaccine  You may need this after age 76. Pneumococcal conjugate (PCV13) vaccine  One dose is recommended after age 60. Pneumococcal polysaccharide (PPSV23) vaccine  One dose is recommended after age 39. Measles, mumps, and rubella (MMR) vaccine  You may need at least one dose of MMR if you were born in 1957 or later. You may also need a second dose. Meningococcal conjugate (MenACWY) vaccine  You may need this if you have certain conditions. Hepatitis A vaccine  You may need this if you have certain conditions or if you travel or work in places where you may be exposed to hepatitis A. Hepatitis B vaccine  You may need  this if you have certain conditions or if you travel or work in places where you may be exposed to hepatitis B. Haemophilus influenzae type b (Hib) vaccine  You may need this if you have certain conditions. You may receive vaccines as individual doses or as more than one vaccine together in one shot (combination vaccines). Talk with your health care provider about the risks and benefits of combination vaccines. What tests do I need? Blood tests  Lipid and cholesterol levels. These may be checked every  5 years, or more frequently depending on your overall health.  Hepatitis C test.  Hepatitis B test. Screening  Lung cancer screening. You may have this screening every year starting at age 47 if you have a 30-pack-year history of smoking and currently smoke or have quit within the past 15 years.  Colorectal cancer screening. All adults should have this screening starting at age 63 and continuing until age 81. Your health care provider may recommend screening at age 24 if you are at increased risk. You will have tests every 1-10 years, depending on your results and the type of screening test.  Diabetes screening. This is done by checking your blood sugar (glucose) after you have not eaten for a while (fasting). You may have this done every 1-3 years.  Mammogram. This may be done every 1-2 years. Talk with your health care provider about how often you should have regular mammograms.  BRCA-related cancer screening. This may be done if you have a family history of breast, ovarian, tubal, or peritoneal cancers. Other tests  Sexually transmitted disease (STD) testing.  Bone density scan. This is done to screen for osteoporosis. You may have this done starting at age 48. Follow these instructions at home: Eating and drinking  Eat a diet that includes fresh fruits and vegetables, whole grains, lean protein, and low-fat dairy products. Limit your intake of foods with high amounts of sugar, saturated fats, and salt.  Take vitamin and mineral supplements as recommended by your health care provider.  Do not drink alcohol if your health care provider tells you not to drink.  If you drink alcohol: ? Limit how much you have to 0-1 drink a day. ? Be aware of how much alcohol is in your drink. In the [place] one drink equals one 12 oz bottle of beer (355 mL), one 5 oz glass of wine (148 mL), or one 1 oz glass of hard liquor (44 mL). Lifestyle  Take daily care of your teeth and gums.  Stay active.  Exercise for at least 30 minutes on 5 or more days each week.  Do not use any products that contain nicotine or tobacco, such as cigarettes, e-cigarettes, and chewing tobacco. If you need help quitting, ask your health care provider.  If you are sexually active, practice safe sex. Use a condom or other form of protection in order to prevent STIs (sexually transmitted infections).  Talk with your health care provider about taking a low-dose aspirin or statin. What's next?  Go to your health care provider once a year for a well check visit.  Ask your health care provider how often you should have your eyes and teeth checked.  Stay up to date on all vaccines. This information is not intended to replace advice given to you by your health care provider. Make sure you discuss any questions you have with your health care provider. Document Revised: 08/19/2018 Document Reviewed: 08/19/2018 Elsevier Patient Education  2020 [employer].

## 2019-11-28 ENCOUNTER — Encounter: Payer: Medicare Other | Admitting: Family Medicine

## 2019-11-28 NOTE — Progress Notes (Deleted)
[facility] [address] [place], [place] [address] [phone]  Date:  11/28/2019   Name:  [name]   DOB:  11/10/1950   MRN:  [id number]  PCP:  Pearline Cables, MD    Chief Complaint: No chief complaint on file.   History of Present Illness:  [name] is a 69 y.o. very pleasant female patient who presents with the following:  Patient with history of pulmonary sarcoidosis, here today for a routine physical Last seen by myself  Her pulmonologist is Dr. Craige Cotta; most recent visit in February.  She is on persistent prednisone, Trelegy inhaler, oxygen at home.  Her oxygen needs have been gradually increasing This are most recently in June 2020 for virtual visit-postherpetic neuralgia following shingles  Mammogram up-to-date Cologuard up-to-date DEXA scan November 2020 Second dose of Shingrix? Immunizations otherwise up-to-date CMP, CBC done in October  She does need a lipid panel and A1c, TSH   Patient Active Problem List   Diagnosis Date Noted  . Other chest pain   . Spontaneous pneumothorax 04/18/2017  . SOB (shortness of breath) 06/05/2016  . Osteopenia 02/12/2016  . Lumbar radiculopathy 01/26/2015  . Chronic respiratory failure with hypoxia (HCC) 04/12/2014  . Obesity (BMI 30-39.9) 02/08/2014  . Allergic rhinitis 09/05/2010  . IBS 09/05/2010  . GERD 02/18/2010  . Barrett's esophagus 02/12/2010  . Upper airway cough syndrome 11/02/2009  . Anxiety state 08/29/2008  . History of colonic polyps 08/29/2008  . PULMONARY SARCOIDOSIS 08/12/2007  . Mild persistent asthma in adult without complication with component of vcd 08/12/2007  . Diaphragmatic hernia 08/12/2007  . Migraine 08/12/2007    Past Medical History:  Diagnosis Date  . Anemia   . Anxiety   . Asthma   . Barrett esophagus   . Depression   . GERD (gastroesophageal reflux disease)   . Hiatal hernia   . IBS (irritable  bowel syndrome)   . Migraines   . Pneumothorax   . Pulmonary sarcoidosis (HCC)   . Supplemental oxygen dependent   . Wears dentures     Past Surgical History:  Procedure Laterality Date  . APPENDECTOMY    . BRAVO PH STUDY N/A 10/10/2013   Procedure: BRAVO PH STUDY;  Surgeon: Hart Carwin, MD;  Location: [facility];  Service: Endoscopy;  Laterality: N/A;  placed 29  . BRONCHIAL BIOPSY  1996  . CATARACT EXTRACTION, BILATERAL    . CHEST TUBE INSERTION Right 04/23/2017   Procedure: CHEST TUBE INSERTION;  Surgeon: [name], MD;  Location: [facility] OR;  Service: Thoracic;  Laterality: Right;  . CHOLECYSTECTOMY    . ESOPHAGOGASTRODUODENOSCOPY N/A 10/10/2013   Procedure: ESOPHAGOGASTRODUODENOSCOPY (EGD);  Surgeon: Hart Carwin, MD;  Location: [facility];  Service: Endoscopy;  Laterality: N/A;  pt asthmatic, RA 92% at rest.  Coughing frequently. Wheezes noted in upper lung fields at auscultation. Pt instructed to use home inhaler prior to procedure and placed on supportive O2 @ 2 lpm in pre-procedural. Teaching done ie. use of inhaler. Pt states she ju  . NISSEN FUNDOPLICATION    . ROTATOR CUFF REPAIR     x3(2 on L, 1 on R)  . VIDEO BRONCHOSCOPY WITH INSERTION OF INTERBRONCHIAL VALVE (IBV) N/A 05/08/2017   Procedure: VIDEO BRONCHOSCOPY WITH INSERTION OF INTERBRONCHIAL VALVE (IBV);  Surgeon: Loreli Slot, MD;  Location: [facility];  Service: Thoracic;  Laterality: N/A;  . VIDEO BRONCHOSCOPY WITH INSERTION  OF INTERBRONCHIAL VALVE (IBV) N/A 07/10/2017   Procedure: VIDEO BRONCHOSCOPY FOR REMOVAL OF ENDOSCOPIC BRONCHIAL VALVE;  Surgeon: Ivin Poot, MD;  Location: [place];  Service: Thoracic;  Laterality: N/A;    Social History   Tobacco Use  . Smoking status: Former Smoker    Packs/day: 1.00    Years: 20.00    Pack years: 20.00    Types: Cigarettes    Quit date: 09/08/1978    Years since quitting: 41.2  . Smokeless tobacco: Never Used  Substance Use Topics  . Alcohol use: No  .  Drug use: No    Family History  Adopted: Yes    Allergies  Allergen Reactions  . Gabapentin Swelling    EDEMA WITH POSITIVE RECHALLENGE  . Lactose Intolerance (Gi) Diarrhea and Other (See Comments)    Stomach cramps  . Pineapple Hives and Itching  . Penicillins Other (See Comments)    Unknown - childhood allergy Has patient had a PCN reaction causing immediate rash, facial/tongue/throat swelling, SOB or lightheadedness with hypotension: Unknown Has patient had a PCN reaction causing severe rash involving mucus membranes or skin necrosis: Unknown Has patient had a PCN reaction that required hospitalization: Unknown Has patient had a PCN reaction occurring within the last 10 years: No If all of the above answers are "NO", then may proceed with Cephalosporin use.  . Aspirin Other (See Comments)    gastritis    Medication list has been reviewed and updated.  Current Outpatient Medications on File Prior to Visit  Medication Sig Dispense Refill  . ALOE VERA JUICE PO Take 120 mLs by mouth daily.     . Ascorbic Acid (VITAMIN C PO) Take 1 tablet by mouth daily. 1000 MG    . ASTRAGALUS PO Take 1 tablet by mouth daily.     . Calcium Carbonate-Vitamin D (CALCIUM-D PO) Take 1 tablet by mouth daily.    . Collagen Hydrolysate POWD Take 1 scoop by mouth daily with supper.     . Cyanocobalamin (VITAMIN B-12) 5000 MCG TBDP Take 5,000 mcg by mouth daily.    . cycloSPORINE (RESTASIS) 0.05 % ophthalmic emulsion Place 1 drop into both eyes 2 (two) times daily.     . Fluticasone-Umeclidin-Vilant (TRELEGY ELLIPTA) 100-62.5-25 MCG/INH AEPB Inhale 1 puff into the lungs daily. 28 each 6  . furosemide (LASIX) 20 MG tablet Take 1 tablet (20 mg total) by mouth daily. Use as needed for swelling 90 tablet 1  . ipratropium (ATROVENT HFA) 17 MCG/ACT inhaler Inhale 2 puffs into the lungs every 6 (six) hours as needed for wheezing. 1 Inhaler 1  . levocetirizine (XYZAL) 5 MG tablet Take 5 mg by mouth every  evening.    . Multiple Vitamin (MULTIVITAMIN WITH MINERALS) TABS tablet Take 1 tablet by mouth daily.    . Nasal Moisturizer Combination (RHINASE) SOLN Place 2 sprays into the nose 2 (two) times daily as needed (dry, bloody nostrils).     . Omega 3-6-9 Fatty Acids (OMEGA 3-6-9 COMPLEX PO) Take 1 capsule by mouth daily.    . OXYGEN Inhale 2-4 L into the lungs continuous. If moving around it is increased    . pantoprazole (PROTONIX) 40 MG tablet Take 1 tablet (40 mg total) by mouth 2 (two) times daily. 180 tablet 1  . polyvinyl alcohol (ARTIFICIAL TEARS) 1.4 % ophthalmic solution Place 1 drop into both eyes See admin instructions. Instill 1 drop in to both eyes once or twice daily as needed for dry eyes -  in between restasis doses    . PREBIOTIC PRODUCT PO Take 1 scoop by mouth daily before breakfast.     . predniSONE (DELTASONE) 5 MG tablet Take 1.5 tablets (7.5 mg total) by mouth daily with breakfast. 45 tablet 3  . Respiratory Therapy Supplies (FLUTTER) DEVI 1 application by Does not apply route 2 (two) times daily. 1 each 0  . sucralfate (CARAFATE) 1 GM/10ML suspension Take 10 mLs (1 g total) by mouth every 6 (six) hours as needed. 420 mL 3  . VENTOLIN HFA 108 (90 Base) MCG/ACT inhaler INHALE 2 PUFFS INTO THE LUNGS EVERY 4 (FOUR) HOURS AS NEEDED FOR WHEEZING OR SHORTNESS OF BREATH. 18 Inhaler 3   No current facility-administered medications on file prior to visit.    Review of Systems:  As per HPI- otherwise negative.   Physical Examination: There were no vitals filed for this visit. There were no vitals filed for this visit. There is no height or weight on file to calculate BMI. Ideal Body Weight:    GEN: no acute distress. HEENT: Atraumatic, Normocephalic.  Ears and Nose: No external deformity. CV: RRR, No M/G/R. No JVD. No thrill. No extra heart sounds. PULM: CTA B, no wheezes, crackles, rhonchi. No retractions. No resp. distress. No accessory muscle use. ABD: S, NT, ND, +BS. No  rebound. No HSM. EXTR: No c/c/e PSYCH: Normally interactive. Conversant.    Assessment and Plan: *** This visit occurred during the SARS-CoV-2 public health emergency.  Safety protocols were in place, including screening questions prior to the visit, additional usage of staff PPE, and extensive cleaning of exam room while observing appropriate contact time as indicated for disinfecting solutions.    Signed Abbe Amsterdam, MD

## 2019-12-12 ENCOUNTER — Telehealth: Payer: Self-pay | Admitting: Family Medicine

## 2019-12-12 NOTE — Chronic Care Management (AMB) (Signed)
  Chronic Care Management   Note  12/12/2019 Name: [name] MRN: [id number] DOB: 1951/05/31  [name] is a 69 y.o. year old female who is a primary care patient of Copland, Gwenlyn Found, MD. I reached out to [name] by phone today in response to a referral sent by Ms. [name]'s PCP, Copland, Gwenlyn Found, MD.   [name] was given information about Chronic Care Management services today including:  1. CCM service includes personalized support from designated clinical staff supervised by her physician, including individualized plan of care and coordination with other care providers 2. 24/7 contact phone numbers for assistance for urgent and routine care needs. 3. Service will only be billed when office clinical staff spend 20 minutes or more in a month to coordinate care. 4. Only one practitioner may furnish and bill the service in a calendar month. 5. The patient may stop CCM services at any time (effective at the end of the month) by phone call to the office staff.   Patient agreed to services and verbal consent obtained.   Follow up plan:   [name] [employer] Scheduler

## 2020-01-02 ENCOUNTER — Other Ambulatory Visit: Payer: Self-pay

## 2020-01-02 DIAGNOSIS — J9611 Chronic respiratory failure with hypoxia: Secondary | ICD-10-CM

## 2020-01-02 DIAGNOSIS — F321 Major depressive disorder, single episode, moderate: Secondary | ICD-10-CM

## 2020-01-05 ENCOUNTER — Ambulatory Visit: Payer: Medicare Other | Admitting: Pharmacist

## 2020-01-05 ENCOUNTER — Other Ambulatory Visit: Payer: Self-pay

## 2020-01-05 DIAGNOSIS — M8588 Other specified disorders of bone density and structure, other site: Secondary | ICD-10-CM

## 2020-01-05 DIAGNOSIS — Z13220 Encounter for screening for lipoid disorders: Secondary | ICD-10-CM

## 2020-01-05 NOTE — Chronic Care Management (AMB) (Signed)
Chronic Care Management Pharmacy  Name: [name]  MRN: [id number] DOB: July 19, 1951  Chief Complaint/ HPI  [name],  69 y.o. , female presents for their Initial CCM visit with the clinical pharmacist via telephone due to COVID-19 Pandemic.  PCP : Darreld Mclean, MD  Their chronic conditions include: Chronic Respiratory Failure, Hyperlipidemia, Barrett's Esophagus, Osteopenia, Allergic Rhinitis, Chronic Dry Eye  Office Visits: 11/25/19: Medicare Annual Wellness Exam w/ Naaman Plummer, RN - Updated goals to increase exercise as tolerated, get to weight of 145-150, and complete chair exercises.   10/24/2019: Visit w/ Dr. Lorelei Pont  Consult Visit: 10/20/2019: Pulmonary visit w/ Dr. Halford Chessman - Pt feels Trelegy is working better than symbicort. Pulmonary Sarcoidosis unstable with advanced disease. Change prednisone to 7.75m daily. Oxygen 2-3L at rest and 7L with exertion  08/29/19: Gastro visit w/ Dr. AHavery Moros- Protonix 420monce to twice a Waylon Hershey as needed due to increasing symptoms. Liquid Carafate given for breakthrough symptoms  08/26/19: Pulmonary visit w/ Dr. SoHalford Chessman Continue predisone 1063maily and Trelegy  Medications: Outpatient Encounter Medications as of 01/05/2020  Medication Sig  . ALOE VERA JUICE PO Take 120 mLs by mouth daily.   . Ascorbic Acid (VITAMIN C PO) Take 1 tablet by mouth daily. 1000 MG  . ASTRAGALUS PO Take 1 tablet by mouth daily.   . Calcium Carbonate-Vitamin D (CALCIUM-D PO) Take 1 tablet by mouth daily.  . Collagen Hydrolysate POWD Take 1 scoop by mouth daily with supper.   . Cyanocobalamin (VITAMIN B-12) 5000 MCG TBDP Take 5,000 mcg by mouth daily.  . cycloSPORINE (RESTASIS) 0.05 % ophthalmic emulsion Place 1 drop into both eyes 2 (two) times daily.   . Fluticasone-Umeclidin-Vilant (TRELEGY ELLIPTA) 100-62.5-25 MCG/INH AEPB Inhale 1 puff into the lungs daily.  . iMarland Kitchenratropium (ATROVENT HFA) 17 MCG/ACT inhaler Inhale 2 puffs into the lungs every 6  (six) hours as needed for wheezing.  . lMarland Kitchenvocetirizine (XYZAL) 5 MG tablet Take 5 mg by mouth every evening.  . Multiple Vitamin (MULTIVITAMIN WITH MINERALS) TABS tablet Take 1 tablet by mouth daily.  . Nasal Moisturizer Combination (RHINASE) SOLN Place 2 sprays into the nose 2 (two) times daily as needed (dry, bloody nostrils).   . Omega 3-6-9 Fatty Acids (OMEGA 3-6-9 COMPLEX PO) Take 1 capsule by mouth daily.  . OXYGEN Inhale 2-4 L into the lungs continuous. If moving around it is increased  . pantoprazole (PROTONIX) 40 MG tablet Take 1 tablet (40 mg total) by mouth 2 (two) times daily.  . polyvinyl alcohol (ARTIFICIAL TEARS) 1.4 % ophthalmic solution Place 1 drop into both eyes See admin instructions. Instill 1 drop in to both eyes once or twice daily as needed for dry eyes - in between restasis doses  . PREBIOTIC PRODUCT PO Take 1 scoop by mouth daily before breakfast.   . predniSONE (DELTASONE) 5 MG tablet Take 1.5 tablets (7.5 mg total) by mouth daily with breakfast.  . Respiratory Therapy Supplies (FLUTTER) DEVI 1 application by Does not apply route 2 (two) times daily.  . sucralfate (CARAFATE) 1 GM/10ML suspension Take 10 mLs (1 g total) by mouth every 6 (six) hours as needed.  . VENTOLIN HFA 108 (90 Base) MCG/ACT inhaler INHALE 2 PUFFS INTO THE LUNGS EVERY 4 (FOUR) HOURS AS NEEDED FOR WHEEZING OR SHORTNESS OF BREATH.  . [DISCONTINUED] furosemide (LASIX) 20 MG tablet Take 1 tablet (20 mg total) by mouth daily. Use as needed for swelling   No facility-administered encounter medications on  file as of 01/05/2020.     Current Diagnosis/Assessment:  Goals Addressed            This Visit's Progress   . Pharmacy Care Plan       CARE PLAN ENTRY  Current Barriers:  . Chronic Disease Management support, education, and care coordination needs related to Chronic Respiratory Failure, Hyperlipidemia, Barrett's Esophagus, Osteopenia, Allergic Rhinitis, Chronic Dry  Eye   Hyperlipidemia . Pharmacist Clinical Goal(s): o Over the next 90 days, patient will work with PharmD and providers to achieve LDL goal <100 . Current regimen:  o Omega 3-6-9 daily . Interventions: o Discussed how to reduce LDL without medication o Discussed goal to reduce ginger lemonade to 2-3 times per week versus daily o Discussed goal to reduce the amount of rice eaten . Patient self care activities - Over the next 90 days, patient will: o Reduce ginger lemonade to 2-3 times per week versus daily o Reduce the amount of rice eaten  Osteopenia . Pharmacist Clinical Goal(s) o Over the next 90 days, patient will work with PharmD and providers to reduce the risk of fracture due to osteopenia . Current regimen:  o Calcium carbonate '1200mg'$ /vitamin 1000 units daily . Interventions: o Discussed with patient to take #1 supplement twice daily versus #2 supplements once daily . Patient self care activities - Over the next 90 days, patient will: o Maintain osteopenia medication regimen  Medication management . Pharmacist Clinical Goal(s): o Over the next 90 days, patient will work with PharmD and providers to maintain optimal medication adherence . Current pharmacy: [facility] . Interventions o Comprehensive medication review performed. o Continue current medication management strategy . Patient self care activities - Over the next 90 days, patient will: o Focus on medication adherence by filling medications appropriately  o Take medications as prescribed o Report any questions or concerns to PharmD and/or provider(s)  Initial goal documentation       Social Hx:  Born in [place]. Raised in [place].  Married.  Daughter lives in [place]. Son and Daughter in [place] live in [place].  Brother in Sports coach passed from [place] in January 2021.  She stays inside her home and is very particular about making sure everyone that comes into her home is masked and wearing shoe covers. She  disinfects everything that comes into her house even before COVID.  Met a group of people on the internet that she does a prayer group with ladies from around the world ([place], [place], [place], [place], [place]). This brings her comfort to prevent depression.  Chronic Respiratory Failure/Tobacco   Last spirometry score: 08/14/2016 FVC: 1.65 (71%) FEV1: 1.39 (76%) FEV1/FVC 84%  Eosinophil count:   Lab Results  Component Value Date/Time   EOSPCT 7.5 (H) 07/06/2019 05:13 PM  %                               Eos (Absolute):  Lab Results  Component Value Date/Time   EOSABS 0.5 07/06/2019 05:13 PM    Tobacco Status:  Social History   Tobacco Use  Smoking Status Former Smoker  . Packs/Sherri Levenhagen: 1.00  . Years: 20.00  . Pack years: 20.00  . Types: Cigarettes  . Quit date: 09/08/1978  . Years since quitting: 41.4  Smokeless Tobacco Never Used    Patient has failed these meds in past: None noted  Patient is currently stable on the following medications: trelegy 1 puff daily, atrovent  60mg/act PRN, ventolin PRN, prednisone 537m1.5 tabs (7.33m72mdaily, flutter device Using maintenance inhaler regularly? Yes Frequency of rescue inhaler use:  several times per month   Wears continuous oxygen.  Atrovent: Has not used in a long time Ventolin: Used about 2-3 weeks ago  Plan -Continue current medications   Hyperlipidemia Screening   Lipid Panel     Component Value Date/Time   CHOL 197 09/27/2018 1149   TRIG 88.0 09/27/2018 1149   HDL 63.40 09/27/2018 1149   CHOLHDL 3 09/27/2018 1149   VLDL 17.6 09/27/2018 1149   LDLCALC 116 (H) 09/27/2018 1149    LDL goal <100   The 10-year ASCVD risk score (GoMikey Bussing Jr., et al., 2013) is: 13.4%   Values used to calculate the score:     Age: 89 10ars     Sex: Female     Is Non-Hispanic African American: Yes     Diabetic: No     Tobacco smoker: No     Systolic Blood Pressure: 132622Hg     Is BP treated: No     HDL Cholesterol: 63.4  mg/dL     Total Cholesterol: 197 mg/dL   Patient has failed these meds in past: None noted  Patient is currently uncontrolled on the following medications: Omega 3-6-9 daily  Eats a lot of rice and eats sweets (makes her own ginger lemonade) Will eat rice when she can't think of anything else to eat  We discussed:  How to reduce LDL without medication  Plan -Reduce ginger lemonade to 2-3 times per week versus daily -Reduce amount of rice eaten -Continue control with diet and exercise  Barrett's Esophagus    Patient has failed these meds in past: None noted  Patient is currently controlled on the following medications: pantoprazole 56m17mD (only takes once daily), sucralfate 10 mL Q6H PRN, prebiotic daily  Doesn't feel hungry often. Uses alarms to set times for her to eat, but she often disregards these  Breakthrough Sx: Daily Breakthrough Tx: Uses gas-x sometimes or sucralfate Triggers: Not eating  "I eat to live. I don't live to eat. I have to eat to keep the acid from churning. I could go days without eating"  Sucralfate: 1-2 times weekly  Plan -Continue current medications   Osteopenia   06/30/19: DEXA - Osteopenia T-Score: -1.7 AP Spine  Patient has failed these meds in past: None noted  Patient is currently controlled on the following medications: calcium carbonate 1200mg46mamin 1000 units daily  We discussed:  How the body can usually only absorb about 600mg 61malcium at one time  Plan -Take calcium supplement twice daily versus #2 once daily -Continue current medications   Allergic Rhinitis    Patient has failed these meds in past: None noted  Patient is currently stable on the following medications: levocetirizine 33mg da63m  "I'm allergic to basically everything!" Feels like her main issue is her sinuses.  Feels like she gets scabs in her nose and when she blows her nose   We discussed: How xyzal can   "I would rather have the runny  nose"  Plan -Consider switching back to loratidine to see if you develop fewer nose bleeds  -Continue current medications   Chronic Dry Eye    Patient has failed these meds in past: None noted  Patient is currently controlled on the following medications: Restasis 1 drop both eyes BID (only uses once daily)  Plan -Continue current medications   Vaccines  Reviewed and discussed patient's vaccination history.    Immunization History  Administered Date(s) Administered  . Fluad Quad(high Dose 65+) 06/30/2019  . Influenza Split 07/29/2012  . Influenza Whole 07/09/2002, 07/09/2009, 06/20/2010  . Influenza, High Dose Seasonal PF 06/03/2016, 06/17/2018  . Influenza,inj,Quad PF,6+ Mos 07/11/2013, 06/29/2014  . PFIZER SARS-COV-2 Vaccination 09/28/2019, 10/19/2019  . Pneumococcal Conjugate-13 06/29/2014  . Pneumococcal Polysaccharide-23 07/09/2002, 12/14/2009, 03/04/2016  . Td 09/05/2010  . Zoster Recombinat (Shingrix) 06/30/2019    Plan  Recommended patient receive Shingrix vaccine in pharmacy.  Patient asks if she has to restart the series. Encouraged patient that she does not need to restart the series, and agreed that she should wait at least 90 days before receiving other vaccines  Supplementation   Patient likes her supplementation regimen. She believes they help, so they help per pt.  Discussed my philosophy on supplementation:  1) Is there evidence that it helps with the condition/symptom you are attempting to treat/resolve? (efficacy) 2) Does it cause side effects? (harm) 3) Does it interact with your other medications? (interactions) 4) Are you able to afford this in your budget? (affordability)  If all things are satisfied then the patient can make their choice whether they would like to continue with supplementation noting there may not be a benefit to the supplementation.   Plan -Continue current medications   Miscellaneous Meds Multivitamin Artificial  Tears Rhinase  Furosemide 36m daily (swelling from taking prednisone) Vitamin B12 50054m daily Astragalus (has taken this for a while. It helps with her immune system and she has not had a cold, only sinus problems) Vitamin C Aloe Vera Juice Lorazepam (for panic attacks. Rarely happens. Mostly with driving in unfamiliar places) Amitriptyline (date filled 02/07/19) Collagen (helps with digestion) Turmeric 1,50048mhelps with pain in knees) Potassium Citrate 15m61m

## 2020-01-16 DIAGNOSIS — Z961 Presence of intraocular lens: Secondary | ICD-10-CM | POA: Diagnosis not present

## 2020-01-16 DIAGNOSIS — H16223 Keratoconjunctivitis sicca, not specified as Sjogren's, bilateral: Secondary | ICD-10-CM | POA: Diagnosis not present

## 2020-01-16 DIAGNOSIS — H353131 Nonexudative age-related macular degeneration, bilateral, early dry stage: Secondary | ICD-10-CM | POA: Diagnosis not present

## 2020-01-16 DIAGNOSIS — H04123 Dry eye syndrome of bilateral lacrimal glands: Secondary | ICD-10-CM | POA: Diagnosis not present

## 2020-01-19 ENCOUNTER — Telehealth: Payer: Self-pay

## 2020-01-19 DIAGNOSIS — R600 Localized edema: Secondary | ICD-10-CM

## 2020-01-19 MED ORDER — FUROSEMIDE 20 MG PO TABS: 20.0000 mg | ORAL_TABLET | Freq: Every day | ORAL | 1 refills | Status: DC

## 2020-01-19 NOTE — Telephone Encounter (Signed)
Medication has been refilled.

## 2020-01-19 NOTE — Telephone Encounter (Signed)
Patient called in to get a prescription refill for  furosemide (LASIX) 20 MG tablet [[id number]]    Please send it to [id number] - [place], [place] - [address] AT [facility] [address]  [address], [place]  [address]  Phone:  [phone] Fax:  [phone]  DEA #:  [id number]

## 2020-01-23 NOTE — Patient Instructions (Signed)
Visit Information  Goals Addressed            This Visit's Progress   . Pharmacy Care Plan       CARE PLAN ENTRY  Current Barriers:  . Chronic Disease Management support, education, and care coordination needs related to Chronic Respiratory Failure, Hyperlipidemia, Barrett's Esophagus, Osteopenia, Allergic Rhinitis, Chronic Dry Eye   Hyperlipidemia . Pharmacist Clinical Goal(s): o Over the next 90 days, patient will work with [name] and providers to achieve LDL goal <100 . Current regimen:  o Omega 3-6-9 daily . Interventions: o Discussed how to reduce LDL without medication o Discussed goal to reduce ginger lemonade to 2-3 times per week versus daily o Discussed goal to reduce the amount of rice eaten . Patient self care activities - Over the next 90 days, patient will: o Reduce ginger lemonade to 2-3 times per week versus daily o Reduce the amount of rice eaten  Osteopenia . Pharmacist Clinical Goal(s) o Over the next 90 days, patient will work with [name] and providers to reduce the risk of fracture due to osteopenia . Current regimen:  o Calcium carbonate 1200mg /vitamin 1000 units daily . Interventions: o Discussed with patient to take #1 supplement twice daily versus #2 supplements once daily . Patient self care activities - Over the next 90 days, patient will: o Maintain osteopenia medication regimen  Medication management . Pharmacist Clinical Goal(s): o Over the next 90 days, patient will work with [name] and providers to maintain optimal medication adherence . Current pharmacy: [facility] . Interventions o Comprehensive medication review performed. o Continue current medication management strategy . Patient self care activities - Over the next 90 days, patient will: o Focus on medication adherence by filling medications appropriately  o Take medications as prescribed o Report any questions or concerns to [name] and/or provider(s)  Initial goal  documentation        [name] was given information about Chronic Care Management services today including:  1. CCM service includes personalized support from designated clinical staff supervised by her physician, including individualized plan of care and coordination with other care providers 2. 24/7 contact phone numbers for assistance for urgent and routine care needs. 3. Standard insurance, coinsurance, copays and deductibles apply for chronic care management only during months in which we provide at least 20 minutes of these services. Most insurances cover these services at 100%, however patients may be responsible for any copay, coinsurance and/or deductible if applicable. This service may help you avoid the need for more expensive face-to-face services. 4. Only one practitioner may furnish and bill the service in a calendar month. 5. The patient may stop CCM services at any time (effective at the end of the month) by phone call to the office staff.  Patient agreed to services and verbal consent obtained.   The patient verbalized understanding of instructions provided today and agreed to receive a mailed copy of patient instruction and/or educational materials. Telephone follow up appointment with pharmacy team member scheduled for: 02/16/2020  [name], [name] Clinical Pharmacist [facility] at [facility] [phone]    Cholesterol Content in Foods Cholesterol is a waxy, fat-like substance that helps to carry fat in the blood. The body needs cholesterol in small amounts, but too much cholesterol can cause damage to the arteries and heart. Most people should eat less than 200 milligrams (mg) of cholesterol a [name]. Foods with cholesterol  Cholesterol is found in animal-based foods, such as meat, seafood,  and dairy. Generally, low-fat dairy and lean meats have less cholesterol than full-fat dairy and fatty meats. The milligrams of cholesterol per serving (mg per  serving) of common cholesterol-containing foods are listed below. Meat and other proteins  Egg -- one large whole egg has 186 mg.  Veal shank -- 4 oz has 141 mg.  Lean ground [place] (93% lean) -- 4 oz has 118 mg.  Fat-trimmed lamb loin -- 4 oz has 106 mg.  Lean ground beef (90% lean) -- 4 oz has 100 mg.  Lobster -- 3.5 oz has 90 mg.  Pork loin chops -- 4 oz has 86 mg.  Canned salmon -- 3.5 oz has 83 mg.  Fat-trimmed beef top loin -- 4 oz has 78 mg.  Frankfurter -- 1 frank (3.5 oz) has 77 mg.  Crab -- 3.5 oz has 71 mg.  Roasted chicken without skin, white meat -- 4 oz has 66 mg.  Light bologna -- 2 oz has 45 mg.  Deli-cut [place] -- 2 oz has 31 mg.  Canned tuna -- 3.5 oz has 31 mg.  [name] -- 1 oz has 29 mg.  Oysters and mussels (raw) -- 3.5 oz has 25 mg.  Mackerel -- 1 oz has 22 mg.  Trout -- 1 oz has 20 mg.  Pork sausage -- 1 link (1 oz) has 17 mg.  Salmon -- 1 oz has 16 mg.  Tilapia -- 1 oz has 14 mg. Dairy  Soft-serve ice cream --  cup (4 oz) has 103 mg.  Whole-milk yogurt -- 1 cup (8 oz) has 29 mg.  Cheddar cheese -- 1 oz has 28 mg.  American cheese -- 1 oz has 28 mg.  Whole milk -- 1 cup (8 oz) has 23 mg.  2% milk -- 1 cup (8 oz) has 18 mg.  Cream cheese -- 1 tablespoon (Tbsp) has 15 mg.  Cottage cheese --  cup (4 oz) has 14 mg.  Low-fat (1%) milk -- 1 cup (8 oz) has 10 mg.  Sour cream -- 1 Tbsp has 8.5 mg.  Low-fat yogurt -- 1 cup (8 oz) has 8 mg.  Nonfat Greek yogurt -- 1 cup (8 oz) has 7 mg.  Half-and-half cream -- 1 Tbsp has 5 mg. Fats and oils  Cod liver oil -- 1 tablespoon (Tbsp) has 82 mg.  Butter -- 1 Tbsp has 15 mg.  Lard -- 1 Tbsp has 14 mg.  Bacon grease -- 1 Tbsp has 14 mg.  Mayonnaise -- 1 Tbsp has 5-10 mg.  Margarine -- 1 Tbsp has 3-10 mg. Exact amounts of cholesterol in these foods may vary depending on specific ingredients and brands. Foods without cholesterol Most plant-based foods do not have cholesterol  unless you combine them with a food that has cholesterol. Foods without cholesterol include:  Grains and cereals.  Vegetables.  Fruits.  Vegetable oils, such as olive, canola, and sunflower oil.  Legumes, such as peas, beans, and lentils.  Nuts and seeds.  Egg whites. Summary  The body needs cholesterol in small amounts, but too much cholesterol can cause damage to the arteries and heart.  Most people should eat less than 200 milligrams (mg) of cholesterol a [name]. This information is not intended to replace advice given to you by your health care provider. Make sure you discuss any questions you have with your health care provider. Document Revised: 08/07/2017 Document Reviewed: 04/21/2017 Elsevier Patient Education  2020 [employer].

## 2020-02-16 ENCOUNTER — Ambulatory Visit: Payer: Medicare Other | Admitting: Pharmacist

## 2020-02-16 ENCOUNTER — Other Ambulatory Visit: Payer: Self-pay

## 2020-02-16 DIAGNOSIS — J3089 Other allergic rhinitis: Secondary | ICD-10-CM

## 2020-02-16 DIAGNOSIS — Z13220 Encounter for screening for lipoid disorders: Secondary | ICD-10-CM

## 2020-02-16 DIAGNOSIS — M8588 Other specified disorders of bone density and structure, other site: Secondary | ICD-10-CM

## 2020-02-16 NOTE — Patient Instructions (Addendum)
Visit Information  Goals Addressed            This Visit's Progress   . Chronic Care Management Pharmacy Care Plan       CARE PLAN ENTRY  Current Barriers:  . Chronic Disease Management support, education, and care coordination needs related to Chronic Respiratory Failure, Hyperlipidemia, Barrett's Esophagus, Osteopenia, Allergic Rhinitis, Chronic Dry Eye   Hyperlipidemia . Pharmacist Clinical Goal(s): o Over the next 90 days, patient will work with PharmD and providers to achieve LDL goal <100 . Current regimen:  o Omega 3-6-9 daily . Interventions: o Discussed how to reduce LDL without medication o Discussed goal to reduce ginger lemonade to 2-3 times per week versus daily o Discussed goal to reduce the amount of rice eaten . Patient self care activities - Over the next 90 days, patient will: o Reduce ginger lemonade to 2-3 times per week versus daily o Reduce the amount of rice eaten  Osteopenia . Pharmacist Clinical Goal(s) o Over the next 90 days, patient will work with PharmD and providers to reduce the risk of fracture due to osteopenia . Current regimen:  o Calcium carbonate 1200mg /vitamin 1000 units daily . Interventions: o Discussed with patient to take #1 supplement twice daily versus #2 supplements once daily . Patient self care activities - Over the next 90 days, patient will: o Maintain osteopenia medication regimen  Health Maintenance . Pharmacist Clinical Goal(s) o Over the next 90 days, patient will work with PharmD and providers to complete recommended vaccine series . Interventions: o Recommended patient to receive 2nd Shingrix vaccine . Patient self care activities - Over the next 90 days, patient will: o Complete 2nd Shingrix vaccine  Medication management . Pharmacist Clinical Goal(s): o Over the next 90 days, patient will work with PharmD and providers to maintain optimal medication adherence . Current pharmacy: . Interventions o Comprehensive medication review performed. o Continue current medication management strategy . Patient self care activities - Over the next 90 days, patient will: o Focus on medication adherence by filling medications appropriately  o Take medications as prescribed o Report any questions or concerns to PharmD and/or provider(s)  Please see past updates related to this goal by clicking on the "Past Updates" button in the selected goal         The patient verbalized understanding of instructions provided today and agreed to receive a mailed copy of patient instruction and/or educational materials.  Telephone follow up appointment with pharmacy team member scheduled for: 05/18/20  07/18/20, PharmD Clinical Pharmacist [facility] at [facility] [phone]

## 2020-02-16 NOTE — Chronic Care Management (AMB) (Signed)
Chronic Care Management Pharmacy  Name: [name]  MRN: [id number] DOB: 01-05-1951  Chief Complaint/ HPI  [name],  69 y.o. , female presents for their Follow-Up CCM visit with the clinical pharmacist via telephone due to COVID-19 Pandemic.  PCP : Darreld Mclean, MD  Their chronic conditions include: Chronic Respiratory Failure, Hyperlipidemia, Barrett's Esophagus, Osteopenia, Allergic Rhinitis, Chronic Dry Eye  Office Visits: None since last CCM visit on 01/05/20.   Consult Visit: None since last CCM visit on 01/05/20.   Medications: Outpatient Encounter Medications as of 02/16/2020  Medication Sig  . ALOE VERA JUICE PO Take 120 mLs by mouth daily.   . Ascorbic Acid (VITAMIN C PO) Take 1 tablet by mouth daily. 1000 MG  . ASTRAGALUS PO Take 1 tablet by mouth daily.   . Calcium Carbonate-Vitamin D (CALCIUM-D PO) Take 1 tablet by mouth daily.  . Collagen Hydrolysate POWD Take 1 scoop by mouth daily with supper.   . Cyanocobalamin (VITAMIN B-12) 5000 MCG TBDP Take 5,000 mcg by mouth daily.  . cycloSPORINE (RESTASIS) 0.05 % ophthalmic emulsion Place 1 drop into both eyes 2 (two) times daily.   . Fluticasone-Umeclidin-Vilant (TRELEGY ELLIPTA) 100-62.5-25 MCG/INH AEPB Inhale 1 puff into the lungs daily.  . furosemide (LASIX) 20 MG tablet Take 1 tablet (20 mg total) by mouth daily. Use as needed for swelling  . ipratropium (ATROVENT HFA) 17 MCG/ACT inhaler Inhale 2 puffs into the lungs every 6 (six) hours as needed for wheezing.  Marland Kitchen levocetirizine (XYZAL) 5 MG tablet Take 5 mg by mouth every evening.  . Multiple Vitamin (MULTIVITAMIN WITH MINERALS) TABS tablet Take 1 tablet by mouth daily.  . Nasal Moisturizer Combination (RHINASE) SOLN Place 2 sprays into the nose 2 (two) times daily as needed (dry, bloody nostrils).   . Omega 3-6-9 Fatty Acids (OMEGA 3-6-9 COMPLEX PO) Take 1 capsule by mouth daily.  . OXYGEN Inhale 2-4 L into the lungs continuous. If moving around it  is increased  . pantoprazole (PROTONIX) 40 MG tablet Take 1 tablet (40 mg total) by mouth 2 (two) times daily.  . polyvinyl alcohol (ARTIFICIAL TEARS) 1.4 % ophthalmic solution Place 1 drop into both eyes See admin instructions. Instill 1 drop in to both eyes once or twice daily as needed for dry eyes - in between restasis doses  . PREBIOTIC PRODUCT PO Take 1 scoop by mouth daily before breakfast.   . predniSONE (DELTASONE) 5 MG tablet Take 1.5 tablets (7.5 mg total) by mouth daily with breakfast.  . Respiratory Therapy Supplies (FLUTTER) DEVI 1 application by Does not apply route 2 (two) times daily.  . sucralfate (CARAFATE) 1 GM/10ML suspension Take 10 mLs (1 g total) by mouth every 6 (six) hours as needed.  . VENTOLIN HFA 108 (90 Base) MCG/ACT inhaler INHALE 2 PUFFS INTO THE LUNGS EVERY 4 (FOUR) HOURS AS NEEDED FOR WHEEZING OR SHORTNESS OF BREATH.   No facility-administered encounter medications on file as of 02/16/2020.     Current Diagnosis/Assessment:  Goals Addressed            This Visit's Progress   . Chronic Care Management Pharmacy Care Plan       CARE PLAN ENTRY  Current Barriers:  . Chronic Disease Management support, education, and care coordination needs related to Chronic Respiratory Failure, Hyperlipidemia, Barrett's Esophagus, Osteopenia, Allergic Rhinitis, Chronic Dry Eye   Hyperlipidemia . Pharmacist Clinical Goal(s): o Over the next 90 days, patient will work with PharmD and providers  to achieve LDL goal <100 . Current regimen:  o Omega 3-6-9 daily . Interventions: o Discussed how to reduce LDL without medication o Discussed goal to reduce ginger lemonade to 2-3 times per week versus daily o Discussed goal to reduce the amount of rice eaten . Patient self care activities - Over the next 90 days, patient will: o Reduce ginger lemonade to 2-3 times per week versus daily o Reduce the amount of rice eaten  Osteopenia . Pharmacist Clinical Goal(s) o Over the  next 90 days, patient will work with PharmD and providers to reduce the risk of fracture due to osteopenia . Current regimen:  o Calcium carbonate 1222m/vitamin 1000 units daily . Interventions: o Discussed with patient to take #1 supplement twice daily versus #2 supplements once daily . Patient self care activities - Over the next 90 days, patient will: o Maintain osteopenia medication regimen  Health Maintenance . Pharmacist Clinical Goal(s) o Over the next 90 days, patient will work with PharmD and providers to complete recommended vaccine series . Interventions: o Recommended patient to receive 2nd Shingrix vaccine . Patient self care activities - Over the next 90 days, patient will: o Complete 2nd Shingrix vaccine  Medication management . Pharmacist Clinical Goal(s): o Over the next 90 days, patient will work with PharmD and providers to maintain optimal medication adherence . Current pharmacy: [facility]. Interventions o Comprehensive medication review performed. o Continue current medication management strategy . Patient self care activities - Over the next 90 days, patient will: o Focus on medication adherence by filling medications appropriately  o Take medications as prescribed o Report any questions or concerns to PharmD and/or provider(s)  Please see past updates related to this goal by clicking on the "Past Updates" button in the selected goal        Social Hx:  [place]  Married.  [place]aughter lives in [place] Son and Daughter in [place]  [place]  She stays inside her home and is very particular about making sure everyone that comes into her home is masked and wearing shoe covers. She disinfects everything that comes into her house even before COVID.  Met a group of people on the internet that she does a prayer group with [place] from [place] This brings her comfort to prevent depression.  She is planning to [place] a [place] Her goal was to move in July, but the housing market is a hindrance. She is now anticipating a goal of October 2021.  Hyperlipidemia Screening   Lipid Panel     Component Value Date/Time   CHOL 197 09/27/2018 1149   TRIG 88.0 09/27/2018 1149   HDL 63.40 09/27/2018 1149   CHOLHDL 3 09/27/2018 1149   VLDL 17.6 09/27/2018 1149   LDLCALC 116 (H) 09/27/2018 1149    LDL goal <100   The 10-year ASCVD risk score (Mikey BussingDC Jr., et al., 2013) is: 13.4%   Values used to calculate the score:     Age: [age]years     Sex: Female     Is Non-Hispanic African American: Yes     Diabetic: No     Tobacco smoker: No     Systolic Blood Pressure: 1161mmHg     Is BP treated: No     HDL Cholesterol: 63.4 mg/dL     Total Cholesterol: 197  mg/dL   Patient has failed these meds in past: None noted  Patient is currently uncontrolled on the following medications: Omega 3-6-9 daily  Eats a lot of rice and eats sweets (makes her own ginger lemonade) Will eat rice when she can't think of anything else to eat  We discussed:  How to reduce LDL without medication   Update 02/16/20 States she is still drinking ginger lemonade, but is no longer putting sugar in it.  Has reduced the amount of rice eaten since our last CCM Visit. She is now eating sweet potatoes. She had a pasta bowl with shrimp, carrots and brocolli yesterday.  She rarely eats out due to being allergic to so many things.  Plan -Continue refraining from adding sugar to ginger lemonade or reducing the amount to 2-3 times per week versus daily -Continue reducing amount of rice eaten -Continue control with diet and exercise   Osteopenia   06/30/19: DEXA - Osteopenia T-Score: -1.7 AP Spine  Patient has failed these meds in past: None noted  Patient is currently controlled on the following medications: calcium  carbonate 1293m/vitamin 1000 units daily  We discussed:  How the body can usually only absorb about 6040mof calcium at one time   She states she sometimes she forgets the dinner dose of calcium.  She is lactose intolerant. She drinks almond/coconut milk combo and mixes it with mango sorbet. She mixes together in her shaker and it tastes like a smoothie. She feels she gets calcium through this drink.  "I can not live without mango and ginger in my life!"  Plan -Continue current medications   Allergic Rhinitis    Patient has failed these meds in past: None noted  Patient is currently stable on the following medications: levocetirizine 43m3maily  From 01/05/20 CCM Visit "I'm allergic to basically everything!" (she has been try allergy specialist in past) Feels like her main issue is her sinuses.  Feels like she gets scabs in her nose and when she blows her nose   Update 02/16/20 She states she is running a "low grade fever" and feels bad. Her usually temp is 96-54F and she is now 98-83F. She feels this is related to her yearly sinus flare up versus covid. She states if she doesn't feel better by Monday she will go to the doctor. States she hasn't taken her daily meds yet. She has year long sinus issues.  She states she ran out of her astragalus and that might be the reason she is feeling bad. She reports she will reorder it soon.   She states she still has a week's worth of xyzal and then plans to switch back to claritin.  Plan -Consider switching back to loratidine to see if you develop fewer nose bleeds  -Continue current medications    Vaccines   Reviewed and discussed patient's vaccination history.    Immunization History  Administered Date(s) Administered  . Fluad Quad(high Dose 65+) 06/30/2019  . Influenza Split 07/29/2012  . Influenza Whole 07/09/2002, 07/09/2009, 06/20/2010  . Influenza, High Dose Seasonal PF 06/03/2016, 06/17/2018  . Influenza,inj,Quad PF,6+ Mos  07/11/2013, 06/29/2014  . PFIZER SARS-COV-2 Vaccination 09/28/2019, 10/19/2019  . Pneumococcal Conjugate-13 06/29/2014  . Pneumococcal Polysaccharide-23 07/09/2002, 12/14/2009, 03/04/2016  . Td 09/05/2010  . Zoster Recombinat (Shingrix) 06/30/2019    Plan  Recommended patient receive Shingrix vaccine in pharmacy.  Patient asks if she has to restart the series. Encouraged patient that she does not need to restart the series, and  agreed that she should wait at least 90 days before receiving other vaccines

## 2020-02-23 ENCOUNTER — Telehealth: Payer: Self-pay | Admitting: Pulmonary Disease

## 2020-02-23 MED ORDER — PREDNISONE 5 MG PO TABS: 7.5000 mg | ORAL_TABLET | Freq: Every day | ORAL | 3 refills | Status: DC

## 2020-02-23 NOTE — Telephone Encounter (Signed)
Assessment/Plan from OV 10/20/19:   Pulmonary sarcoidosis. - unstable - she has advanced disease - explained that much of her symptom status will likely be chronic - will try change prednisone to 7.5 mg daily - she will try mild exercise program at home  Called and spoke with pt to verify that she was still taking 7.5mg  daily and she stated that that was correct. Verified preferred pharmacy and have sent Rx in for pt. Nothing further needed.

## 2020-03-23 ENCOUNTER — Telehealth: Payer: Self-pay

## 2020-03-23 NOTE — Telephone Encounter (Signed)
Called pt to touch base about if she has decided if she wants to have EGD w/ dilation at the hospital.  She indicated she is leery about having a procedure at the hospital if she doesn't have to due to Covid and her respiratory condition. Her symptoms are about the same; not worsening. She said she will call us if she decides she would like to be scheduled.  I told her we would take her off our list and will wait to hear from her.  She agreed.  I let her know she will be due for Cologuard in 2023 if she doesn't have colonoscopy beforehand.  She expressed understanding.

## 2020-03-23 NOTE — Telephone Encounter (Signed)
Jan thanks so much for following up with her, I appreciate it. Agree with plan, she can call us to follow up as needed moving forward

## 2020-05-18 ENCOUNTER — Ambulatory Visit: Payer: Medicare Other | Admitting: Pharmacist

## 2020-05-18 DIAGNOSIS — Z13220 Encounter for screening for lipoid disorders: Secondary | ICD-10-CM

## 2020-05-18 DIAGNOSIS — J3089 Other allergic rhinitis: Secondary | ICD-10-CM

## 2020-05-18 NOTE — Patient Instructions (Addendum)
Visit Information  Goals Addressed            This Visit's Progress   . Chronic Care Management Pharmacy Care Plan       CARE PLAN ENTRY (see longitudinal plan of care for additional care plan information)  Current Barriers:  . Chronic Disease Management support, education, and care coordination needs related to Chronic Respiratory Failure, Hyperlipidemia, Barrett's Esophagus, Osteopenia, Allergic Rhinitis, Chronic Dry Eye   Hyperlipidemia Lab Results  Component Value Date/Time   LDLCALC 116 (H) 09/27/2018 11:49 AM   . Pharmacist Clinical Goal(s): o Over the next 180 days, patient will work with PharmD and providers to achieve LDL goal < 100 . Current regimen:  o Omega 3-6-9 daily  . Interventions: o Discussed how to reduce LDL without medication o Discussed goal to reduce ginger lemonade to 2-3 times per week versus daily o Discussed goal to reduce the amount of rice eaten . Patient self care activities - Over the next 180 days, patient will: o Reduce ginger lemonade to 2-3 times per week versus daily o Reduce the amount of rice eaten  Osteopenia . Pharmacist Clinical Goal(s) o Over the next 90 days, patient will work with PharmD and providers to reduce the risk of fracture due to osteopenia . Current regimen:  o Calcium carbonate 1200mg /vitamin 1000 units daily . Interventions: o Discussed with patient to take #1 supplement twice daily versus #2 supplements once daily . Patient self care activities - Over the next 90 days, patient will: o Maintain osteopenia medication regimen  Health Maintenance . Pharmacist Clinical Goal(s) o Over the next 90 days, patient will work with PharmD and providers to complete recommended vaccine series . Interventions: o Recommended patient to receive 2nd Shingrix vaccine . Patient self care activities - Over the next 90 days, patient will: o Complete 2nd Shingrix vaccine  Medication management . Pharmacist Clinical Goal(s): o Over  the next 90 days, patient will work with PharmD and providers to maintain optimal medication adherence . Current pharmacy: . Interventions o Comprehensive medication review performed. o Continue current medication management strategy . Patient self care activities - Over the next 90 days, patient will: o Focus on medication adherence by filling medications appropriately  o Take medications as prescribed o Report any questions or concerns to PharmD and/or provider(s)  Please see past updates related to this goal by clicking on the "Past Updates" button in the selected goal         The patient verbalized understanding of instructions provided today and agreed to receive a mailed copy of patient instruction and/or educational materials.  Telephone follow up appointment with pharmacy team member scheduled for: 11/15/2020  01/15/2021 Anwar Crill, PharmD Clinical Pharmacist [facility] at [facility] [phone]

## 2020-05-18 NOTE — Chronic Care Management (AMB) (Signed)
Chronic Care Management Pharmacy  Name: LILYMAE SWIECH  MRN: [id number] DOB: 1951-04-28  Chief Complaint/ HPI  [name],  69 y.o. , female presents for their Follow-Up CCM visit with the clinical pharmacist via telephone due to COVID-19 Pandemic.  PCP : Darreld Mclean, MD  Their chronic conditions include: Chronic Respiratory Failure, Hyperlipidemia, Barrett's Esophagus, Osteopenia, Allergic Rhinitis, Chronic Dry Eye  Office Visits: None since last CCM visit on 02/16/20.   Consult Visit: None since last CCM visit on 02/16/20.   Medications: Outpatient Encounter Medications as of 05/18/2020  Medication Sig  . ALOE VERA JUICE PO Take 120 mLs by mouth daily.   . Ascorbic Acid (VITAMIN C PO) Take 1 tablet by mouth daily. 1000 MG  . ASTRAGALUS PO Take 1 tablet by mouth daily.   . Calcium Carbonate-Vitamin D (CALCIUM-D PO) Take 1 tablet by mouth daily.  . Collagen Hydrolysate POWD Take 1 scoop by mouth daily with supper.   . Cyanocobalamin (VITAMIN B-12) 5000 MCG TBDP Take 5,000 mcg by mouth daily.  . cycloSPORINE (RESTASIS) 0.05 % ophthalmic emulsion Place 1 drop into both eyes 2 (two) times daily.   . Fluticasone-Umeclidin-Vilant (TRELEGY ELLIPTA) 100-62.5-25 MCG/INH AEPB Inhale 1 puff into the lungs daily.  . furosemide (LASIX) 20 MG tablet Take 1 tablet (20 mg total) by mouth daily. Use as needed for swelling  . ipratropium (ATROVENT HFA) 17 MCG/ACT inhaler Inhale 2 puffs into the lungs every 6 (six) hours as needed for wheezing.  Marland Kitchen levocetirizine (XYZAL) 5 MG tablet Take 5 mg by mouth every evening.  . Multiple Vitamin (MULTIVITAMIN WITH MINERALS) TABS tablet Take 1 tablet by mouth daily.  . Nasal Moisturizer Combination (RHINASE) SOLN Place 2 sprays into the nose 2 (two) times daily as needed (dry, bloody nostrils).   . Omega 3-6-9 Fatty Acids (OMEGA 3-6-9 COMPLEX PO) Take 1 capsule by mouth daily.  . OXYGEN Inhale 2-4 L into the lungs continuous. If moving around it  is increased  . pantoprazole (PROTONIX) 40 MG tablet Take 1 tablet (40 mg total) by mouth 2 (two) times daily.  . polyvinyl alcohol (ARTIFICIAL TEARS) 1.4 % ophthalmic solution Place 1 drop into both eyes See admin instructions. Instill 1 drop in to both eyes once or twice daily as needed for dry eyes - in between restasis doses  . PREBIOTIC PRODUCT PO Take 1 scoop by mouth daily before breakfast.   . predniSONE (DELTASONE) 5 MG tablet Take 1.5 tablets (7.5 mg total) by mouth daily with breakfast.  . Respiratory Therapy Supplies (FLUTTER) DEVI 1 application by Does not apply route 2 (two) times daily.  . sucralfate (CARAFATE) 1 GM/10ML suspension Take 10 mLs (1 g total) by mouth every 6 (six) hours as needed.  . VENTOLIN HFA 108 (90 Base) MCG/ACT inhaler INHALE 2 PUFFS INTO THE LUNGS EVERY 4 (FOUR) HOURS AS NEEDED FOR WHEEZING OR SHORTNESS OF BREATH.   No facility-administered encounter medications on file as of 05/18/2020.   SDOH Screenings   Alcohol Screen:   . Last Alcohol Screening Score (AUDIT): Not on file  Depression (PHQ2-9): Low Risk   . PHQ-2 Score: 1  Financial Resource Strain: Low Risk   . Difficulty of Paying Living Expenses: Not hard at all  Food Insecurity: No Food Insecurity  . Worried About Charity fundraiser in the Last Year: Never true  . Ran Out of Food in the Last Year: Never true  Housing: Low Risk   . Last Housing  Risk Score: 0  Physical Activity: Inactive  . Days of Exercise per Week: 0 days  . Minutes of Exercise per Session: 0 min  Social Connections: Moderately Isolated  . Frequency of Communication with Friends and Family: More than three times a week  . Frequency of Social Gatherings with Friends and Family: More than three times a week  . Attends Religious Services: Never  . Active Member of Clubs or Organizations: No  . Attends Archivist Meetings: Never  . Marital Status: Married  Stress:   . Feeling of Stress : Not on file  Tobacco Use:  Medium Risk  . Smoking Tobacco Use: Former Smoker  . Smokeless Tobacco Use: Never Used  Transportation Needs: No Transportation Needs  . Lack of Transportation (Medical): No  . Lack of Transportation (Non-Medical): No     Current Diagnosis/Assessment:  Goals Addressed            This Visit's Progress   . Chronic Care Management Pharmacy Care Plan       CARE PLAN ENTRY (see longitudinal plan of care for additional care plan information)  Current Barriers:  . Chronic Disease Management support, education, and care coordination needs related to Chronic Respiratory Failure, Hyperlipidemia, Barrett's Esophagus, Osteopenia, Allergic Rhinitis, Chronic Dry Eye   Hyperlipidemia Lab Results  Component Value Date/Time   LDLCALC 116 (H) 09/27/2018 11:49 AM   . Pharmacist Clinical Goal(s): o Over the next 180 days, patient will work with PharmD and providers to achieve LDL goal < 100 . Current regimen:  o Omega 3-6-9 daily  . Interventions: o Discussed how to reduce LDL without medication o Discussed goal to reduce ginger lemonade to 2-3 times per week versus daily o Discussed goal to reduce the amount of rice eaten . Patient self care activities - Over the next 180 days, patient will: o Reduce ginger lemonade to 2-3 times per week versus daily o Reduce the amount of rice eaten  Osteopenia . Pharmacist Clinical Goal(s) o Over the next 90 days, patient will work with PharmD and providers to reduce the risk of fracture due to osteopenia . Current regimen:  o Calcium carbonate 1282m/vitamin 1000 units daily . Interventions: o Discussed with patient to take #1 supplement twice daily versus #2 supplements once daily . Patient self care activities - Over the next 90 days, patient will: o Maintain osteopenia medication regimen  Health Maintenance . Pharmacist Clinical Goal(s) o Over the next 90 days, patient will work with PharmD and providers to complete recommended vaccine  series . Interventions: o Recommended patient to receive 2nd Shingrix vaccine . Patient self care activities - Over the next 90 days, patient will: o Complete 2nd Shingrix vaccine  Medication management . Pharmacist Clinical Goal(s): o Over the next 90 days, patient will work with PharmD and providers to maintain optimal medication adherence . Current pharmacy: [facility]. Interventions o Comprehensive medication review performed. o Continue current medication management strategy . Patient self care activities - Over the next 90 days, patient will: o Focus on medication adherence by filling medications appropriately  o Take medications as prescribed o Report any questions or concerns to PharmD and/or provider(s)  Please see past updates related to this goal by clicking on the "Past Updates" button in the selected goal        Social Hx:  Born in [place]  Married.  [place]aughter lives in [place] Son and Daughter in [place]  Brother in  law passed from [place] in January 2021.  She stays inside her home and is very particular about making sure everyone that comes into her home is masked and wearing shoe covers. She disinfects everything that comes into her house even before COVID.  Met a group of people on the internet that she does a prayer group with ladies from around the world ([place], [place], [place], [place], [place]). This brings her comfort to prevent depression.  She is planning to look for a home in [place]. Her goal was to move in July, but the housing market is a hindrance. She is now anticipating a goal of October 2021.  Update 05/18/20 Looks forward to seeing her family in [place] during Christmas.  She plans to move to [place].   Hyperlipidemia Screening   Lipid Panel     Component Value Date/Time   CHOL 197 09/27/2018 1149   TRIG 88.0 09/27/2018 1149   HDL 63.40 09/27/2018 1149   CHOLHDL 3 09/27/2018 1149   VLDL 17.6  09/27/2018 1149   LDLCALC 116 (H) 09/27/2018 1149    LDL goal <100   The 10-year ASCVD risk score Mikey Bussing DC Jr., et al., 2013) is: 13.4%   Values used to calculate the score:     Age: 38 years     Sex: Female     Is Non-Hispanic African American: Yes     Diabetic: No     Tobacco smoker: No     Systolic Blood Pressure: 035 mmHg     Is BP treated: No     HDL Cholesterol: 63.4 mg/dL     Total Cholesterol: 197 mg/dL   Patient has failed these meds in past: None noted  Patient is currently uncontrolled on the following medications:   Omega 3-6-9 daily   Eats a lot of rice and eats sweets (makes her own ginger lemonade) Will eat rice when she can't think of anything else to eat  We discussed:  How to reduce LDL without medication   Update 02/16/20 States she is still drinking ginger lemonade, but is no longer putting sugar in it.  Has reduced the amount of rice eaten since our last CCM Visit. She is now eating sweet potatoes. She had a pasta bowl with shrimp, carrots and brocolli yesterday.  She rarely eats out due to being allergic to so many things.  Update 05/18/20 She is still refraining from using sugar in her ginger lemonade.   Plan -Continue refraining from adding sugar to ginger lemonade or reducing the amount to 2-3 times per week versus daily -Continue reducing amount of rice eaten -Continue control with diet and exercise  Allergic Rhinitis    Patient has failed these meds in past: None noted  Patient is currently stable on the following medications:   Loratadine 67m daily  From 01/05/20 CCM Visit "I'm allergic to basically everything!" (she has been try allergy specialist in past) Feels like her main issue is her sinuses.  Feels like she gets scabs in her nose and when she blows her nose   Update 02/16/20 She states she is running a "low grade fever" and feels bad. Her usually temp is 96-85F and she is now 98-70F. She feels this is related to her yearly sinus  flare up versus covid. She states if she doesn't feel better by Monday she will go to the doctor. States she hasn't taken her daily meds yet. She has year long sinus issues.  She states she ran out of her astragalus  and that might be the reason she is feeling bad. She reports she will reorder it soon.   She states she still has a week's worth of xyzal and then plans to switch back to claritin.  Update 05/18/20 Sinuses are bothering her. Feels this is coming from her nasal canula from her oxygen. She does use a saline She plans to get booster when available.  She did switch back to claritin. Feels this is working.  Plan -Continue current medications    Vaccines   Reviewed and discussed patient's vaccination history.    Immunization History  Administered Date(s) Administered  . Fluad Quad(high Dose 65+) 06/30/2019  . Influenza Split 07/29/2012  . Influenza Whole 07/09/2002, 07/09/2009, 06/20/2010  . Influenza, High Dose Seasonal PF 06/03/2016, 06/17/2018  . Influenza,inj,Quad PF,6+ Mos 07/11/2013, 06/29/2014  . PFIZER SARS-COV-2 Vaccination 09/28/2019, 10/19/2019  . Pneumococcal Conjugate-13 06/29/2014  . Pneumococcal Polysaccharide-23 07/09/2002, 12/14/2009, 03/04/2016  . Td 09/05/2010  . Zoster Recombinat (Shingrix) 06/30/2019   Patient asks if she has to restart the series. Encouraged patient that she does not need to restart the series, and agreed that she should wait at least 90 days before receiving other vaccines  Update 05/18/20 Plans to get the covid booster. Recommend patient get this vaccine before Shingrix.   Plan Recommended patient receive Shingrix vaccine in pharmacy after COVID Vaccine booster    Miscellaneous Meds Started new meds recently (on Monday 05/14/20) Clearlungs - congestion Oregamax - digestion Mixes Cinnamon, honey, lemon juice, and olive oil with hot water and drinks it. She feels this is helping her with congestion  De Blanch, PharmD Clinical  Pharmacist [facility] [phone]

## 2020-06-24 DIAGNOSIS — J45909 Unspecified asthma, uncomplicated: Secondary | ICD-10-CM | POA: Diagnosis not present

## 2020-06-24 DIAGNOSIS — D869 Sarcoidosis, unspecified: Secondary | ICD-10-CM | POA: Diagnosis not present

## 2020-06-25 ENCOUNTER — Telehealth: Payer: Self-pay | Admitting: Pulmonary Disease

## 2020-06-25 DIAGNOSIS — D860 Sarcoidosis of lung: Secondary | ICD-10-CM

## 2020-06-25 DIAGNOSIS — J4530 Mild persistent asthma, uncomplicated: Secondary | ICD-10-CM

## 2020-06-25 DIAGNOSIS — J9611 Chronic respiratory failure with hypoxia: Secondary | ICD-10-CM

## 2020-06-25 MED ORDER — PREDNISONE 5 MG PO TABS: 7.5000 mg | ORAL_TABLET | Freq: Every day | ORAL | 3 refills | Status: DC

## 2020-06-25 MED ORDER — TRELEGY ELLIPTA 100-62.5-25 MCG/INH IN AEPB: 1.0000 | INHALATION_SPRAY | Freq: Every day | RESPIRATORY_TRACT | 6 refills | Status: AC

## 2020-06-25 NOTE — Telephone Encounter (Signed)
Refill of both prednisone and trelegy have been sent to preferred pharmacy for pt. Called and spoke with pt letting her know this had been done and she verbalized understanding. Also have scheduled pt a f/u appt as she was due for an appt. Nothing further needed.

## 2020-07-07 ENCOUNTER — Other Ambulatory Visit: Payer: Self-pay

## 2020-07-07 ENCOUNTER — Encounter (HOSPITAL_COMMUNITY): Payer: Self-pay | Admitting: Emergency Medicine

## 2020-07-07 ENCOUNTER — Inpatient Hospital Stay (HOSPITAL_COMMUNITY)
Admission: EM | Admit: 2020-07-07 | Discharge: 2020-07-12 | DRG: 202 | Disposition: A | Discharge status: Home / Self Care | Payer: Medicare Other | Attending: Internal Medicine | Admitting: Internal Medicine

## 2020-07-07 ENCOUNTER — Emergency Department (HOSPITAL_COMMUNITY): Payer: Medicare Other

## 2020-07-07 DIAGNOSIS — Z9049 Acquired absence of other specified parts of digestive tract: Secondary | ICD-10-CM | POA: Diagnosis not present

## 2020-07-07 DIAGNOSIS — Z91018 Allergy to other foods: Secondary | ICD-10-CM

## 2020-07-07 DIAGNOSIS — Z87891 Personal history of nicotine dependence: Secondary | ICD-10-CM | POA: Diagnosis not present

## 2020-07-07 DIAGNOSIS — F411 Generalized anxiety disorder: Secondary | ICD-10-CM | POA: Diagnosis present

## 2020-07-07 DIAGNOSIS — I712 Thoracic aortic aneurysm, without rupture: Secondary | ICD-10-CM | POA: Diagnosis not present

## 2020-07-07 DIAGNOSIS — Z6841 Body Mass Index (BMI) 40.0 and over, adult: Secondary | ICD-10-CM

## 2020-07-07 DIAGNOSIS — K219 Gastro-esophageal reflux disease without esophagitis: Secondary | ICD-10-CM | POA: Diagnosis present

## 2020-07-07 DIAGNOSIS — Z20822 Contact with and (suspected) exposure to covid-19: Secondary | ICD-10-CM | POA: Diagnosis present

## 2020-07-07 DIAGNOSIS — Z886 Allergy status to analgesic agent status: Secondary | ICD-10-CM

## 2020-07-07 DIAGNOSIS — I272 Pulmonary hypertension, unspecified: Secondary | ICD-10-CM | POA: Diagnosis present

## 2020-07-07 DIAGNOSIS — R000 Tachycardia, unspecified: Secondary | ICD-10-CM | POA: Diagnosis not present

## 2020-07-07 DIAGNOSIS — J849 Interstitial pulmonary disease, unspecified: Secondary | ICD-10-CM | POA: Diagnosis not present

## 2020-07-07 DIAGNOSIS — J4531 Mild persistent asthma with (acute) exacerbation: Secondary | ICD-10-CM | POA: Diagnosis not present

## 2020-07-07 DIAGNOSIS — Z888 Allergy status to other drugs, medicaments and biological substances status: Secondary | ICD-10-CM

## 2020-07-07 DIAGNOSIS — Z7951 Long term (current) use of inhaled steroids: Secondary | ICD-10-CM

## 2020-07-07 DIAGNOSIS — R040 Epistaxis: Secondary | ICD-10-CM | POA: Diagnosis not present

## 2020-07-07 DIAGNOSIS — D869 Sarcoidosis, unspecified: Secondary | ICD-10-CM | POA: Diagnosis not present

## 2020-07-07 DIAGNOSIS — Z23 Encounter for immunization: Secondary | ICD-10-CM | POA: Diagnosis not present

## 2020-07-07 DIAGNOSIS — J309 Allergic rhinitis, unspecified: Secondary | ICD-10-CM | POA: Diagnosis present

## 2020-07-07 DIAGNOSIS — J4530 Mild persistent asthma, uncomplicated: Secondary | ICD-10-CM | POA: Diagnosis present

## 2020-07-07 DIAGNOSIS — Z9981 Dependence on supplemental oxygen: Secondary | ICD-10-CM

## 2020-07-07 DIAGNOSIS — M5416 Radiculopathy, lumbar region: Secondary | ICD-10-CM | POA: Diagnosis not present

## 2020-07-07 DIAGNOSIS — R0902 Hypoxemia: Secondary | ICD-10-CM

## 2020-07-07 DIAGNOSIS — J9621 Acute and chronic respiratory failure with hypoxia: Secondary | ICD-10-CM | POA: Diagnosis present

## 2020-07-07 DIAGNOSIS — E875 Hyperkalemia: Secondary | ICD-10-CM | POA: Diagnosis not present

## 2020-07-07 DIAGNOSIS — K589 Irritable bowel syndrome without diarrhea: Secondary | ICD-10-CM | POA: Diagnosis present

## 2020-07-07 DIAGNOSIS — J9620 Acute and chronic respiratory failure, unspecified whether with hypoxia or hypercapnia: Secondary | ICD-10-CM | POA: Diagnosis present

## 2020-07-07 DIAGNOSIS — I517 Cardiomegaly: Secondary | ICD-10-CM | POA: Diagnosis not present

## 2020-07-07 DIAGNOSIS — Z91011 Allergy to milk products: Secondary | ICD-10-CM

## 2020-07-07 DIAGNOSIS — K449 Diaphragmatic hernia without obstruction or gangrene: Secondary | ICD-10-CM | POA: Diagnosis present

## 2020-07-07 DIAGNOSIS — F32A Depression, unspecified: Secondary | ICD-10-CM | POA: Diagnosis not present

## 2020-07-07 DIAGNOSIS — I77810 Thoracic aortic ectasia: Secondary | ICD-10-CM

## 2020-07-07 DIAGNOSIS — Z7952 Long term (current) use of systemic steroids: Secondary | ICD-10-CM

## 2020-07-07 DIAGNOSIS — J9690 Respiratory failure, unspecified, unspecified whether with hypoxia or hypercapnia: Secondary | ICD-10-CM | POA: Diagnosis not present

## 2020-07-07 DIAGNOSIS — J45909 Unspecified asthma, uncomplicated: Secondary | ICD-10-CM | POA: Diagnosis not present

## 2020-07-07 DIAGNOSIS — J441 Chronic obstructive pulmonary disease with (acute) exacerbation: Secondary | ICD-10-CM | POA: Diagnosis present

## 2020-07-07 DIAGNOSIS — R0602 Shortness of breath: Secondary | ICD-10-CM | POA: Diagnosis not present

## 2020-07-07 DIAGNOSIS — Z79899 Other long term (current) drug therapy: Secondary | ICD-10-CM

## 2020-07-07 DIAGNOSIS — D860 Sarcoidosis of lung: Secondary | ICD-10-CM | POA: Diagnosis present

## 2020-07-07 DIAGNOSIS — Z880 Allergy status to penicillin: Secondary | ICD-10-CM

## 2020-07-07 DIAGNOSIS — J9811 Atelectasis: Secondary | ICD-10-CM | POA: Diagnosis not present

## 2020-07-07 DIAGNOSIS — R079 Chest pain, unspecified: Secondary | ICD-10-CM | POA: Diagnosis not present

## 2020-07-07 LAB — RESPIRATORY PANEL BY RT PCR (FLU A&B, COVID)
Influenza A by PCR: NEGATIVE
Influenza B by PCR: NEGATIVE
SARS Coronavirus 2 by RT PCR: NEGATIVE

## 2020-07-07 LAB — I-STAT VENOUS BLOOD GAS, ED
Acid-Base Excess: 8 mmol/L — ABNORMAL HIGH (ref 0.0–2.0)
Bicarbonate: 33.3 mmol/L — ABNORMAL HIGH (ref 20.0–28.0)
Calcium, Ion: 1.08 mmol/L — ABNORMAL LOW (ref 1.15–1.40)
HCT: 41 % (ref 36.0–46.0)
Hemoglobin: 13.9 g/dL (ref 12.0–15.0)
O2 Saturation: 92 %
Potassium: 5.9 mmol/L — ABNORMAL HIGH (ref 3.5–5.1)
Sodium: 139 mmol/L (ref 135–145)
TCO2: 35 mmol/L — ABNORMAL HIGH (ref 22–32)
pCO2, Ven: 46 mmHg (ref 44.0–60.0)
pH, Ven: 7.468 — ABNORMAL HIGH (ref 7.250–7.430)
pO2, Ven: 59 mmHg — ABNORMAL HIGH (ref 32.0–45.0)

## 2020-07-07 LAB — CBC WITH DIFFERENTIAL/PLATELET
Abs Immature Granulocytes: 0.03 10*3/uL (ref 0.00–0.07)
Basophils Absolute: 0.2 10*3/uL — ABNORMAL HIGH (ref 0.0–0.1)
Basophils Relative: 2 %
Eosinophils Absolute: 0.1 10*3/uL (ref 0.0–0.5)
Eosinophils Relative: 1 %
HCT: 43.1 % (ref 36.0–46.0)
Hemoglobin: 13.2 g/dL (ref 12.0–15.0)
Immature Granulocytes: 0 %
Lymphocytes Relative: 31 %
Lymphs Abs: 2.7 10*3/uL (ref 0.7–4.0)
MCH: 29.4 pg (ref 26.0–34.0)
MCHC: 30.6 g/dL (ref 30.0–36.0)
MCV: 96 fL (ref 80.0–100.0)
Monocytes Absolute: 0.9 10*3/uL (ref 0.1–1.0)
Monocytes Relative: 11 %
Neutro Abs: 4.9 10*3/uL (ref 1.7–7.7)
Neutrophils Relative %: 55 %
Platelets: 268 10*3/uL (ref 150–400)
RBC: 4.49 MIL/uL (ref 3.87–5.11)
RDW: 13 % (ref 11.5–15.5)
WBC: 8.5 10*3/uL (ref 4.0–10.5)
nRBC: 0 % (ref 0.0–0.2)

## 2020-07-07 LAB — BASIC METABOLIC PANEL
Anion gap: 13 (ref 5–15)
BUN: 11 mg/dL (ref 8–23)
CO2: 26 mmol/L (ref 22–32)
Calcium: 9.4 mg/dL (ref 8.9–10.3)
Chloride: 101 mmol/L (ref 98–111)
Creatinine, Ser: 1.04 mg/dL — ABNORMAL HIGH (ref 0.44–1.00)
GFR, Estimated: 57 mL/min — ABNORMAL LOW (ref >60)
Glucose, Bld: 88 mg/dL (ref 70–99)
Potassium: 5.2 mmol/L — ABNORMAL HIGH (ref 3.5–5.1)
Sodium: 140 mmol/L (ref 135–145)

## 2020-07-07 LAB — BRAIN NATRIURETIC PEPTIDE: B Natriuretic Peptide: 28.7 pg/mL (ref 0.0–100.0)

## 2020-07-07 MED ORDER — METHYLPREDNISOLONE SODIUM SUCC 125 MG IJ SOLR
125.0000 mg | Freq: Once | INTRAMUSCULAR | Status: AC
  Administered 2020-07-07: 125 mg via INTRAVENOUS
  Filled 2020-07-07: qty 2

## 2020-07-07 MED ORDER — IPRATROPIUM-ALBUTEROL 0.5-2.5 (3) MG/3ML IN SOLN
3.0000 mL | Freq: Three times a day (TID) | RESPIRATORY_TRACT | Status: DC | PRN
  Administered 2020-07-07 – 2020-07-10 (×4): 3 mL via RESPIRATORY_TRACT
  Filled 2020-07-07: qty 3
  Filled 2020-07-07: qty 6
  Filled 2020-07-07: qty 3

## 2020-07-07 MED ORDER — IPRATROPIUM-ALBUTEROL 0.5-2.5 (3) MG/3ML IN SOLN: 3.0000 mL | Freq: Once | RESPIRATORY_TRACT | Status: DC

## 2020-07-07 MED ORDER — MAGNESIUM SULFATE 2 GM/50ML IV SOLN
2.0000 g | Freq: Once | INTRAVENOUS | Status: AC
  Administered 2020-07-07: 2 g via INTRAVENOUS

## 2020-07-07 MED ORDER — DOXYCYCLINE HYCLATE 100 MG PO TABS
100.0000 mg | ORAL_TABLET | Freq: Once | ORAL | Status: DC
  Filled 2020-07-07: qty 1

## 2020-07-07 NOTE — ED Notes (Signed)
edp at the bedside 

## 2020-07-07 NOTE — ED Notes (Signed)
Unsuccessful attempt to get the rn on 2w to answer the phone  In a room  According to the person who answered the phone  Second attempt to give report

## 2020-07-07 NOTE — ED Triage Notes (Addendum)
Pt reports increased shortness of breath, worse with exertion, states she uses 2L [place] O2 at all times, but has been having to increase her amount. Today she noticed that her oxygen levels were in the 70s at home. C/o lower extremity swelling, weight gain, and occasional fever. States she has been vaccinated for covid.

## 2020-07-07 NOTE — ED Provider Notes (Signed)
[facility] Provider Note   CSN: [id number] Arrival date & time: 07/07/20  1524     History Chief Complaint  Patient presents with  . Shortness of Breath    Margaretha SeedsBarbara V Bernards is a 69 y.o. female w/ pulm sarcoidosis, ILD on 2L [place] baseline at home presenting to ED with shortness of breath and dyspnea.  Worsening symptoms x 2 weeks.  Finding it difficult to perform usual tasks of ambulating around the house without needing higher O2, up to 8-10L at home with ambulation.  Also reporting worsening mucous productive cough, subjective fever at home.  She reports her nebulizer machine broke at home so she has not been able to give herself breathing treatments.  She is on 7.5 mg prednisone daily for her ILD/sarcoidosis.  Her next pulm appointment is in December 2021.  She is vaccinated for covid x 2, has not had booster shot.  HPI     Past Medical History:  Diagnosis Date  . Anemia   . Anxiety   . Asthma   . Barrett esophagus   . Depression   . GERD (gastroesophageal reflux disease)   . Hiatal hernia   . IBS (irritable bowel syndrome)   . Migraines   . Pneumothorax   . Pulmonary sarcoidosis (HCC)   . Supplemental oxygen dependent   . Wears dentures     Patient Active Problem List   Diagnosis Date Noted  . Acute on chronic respiratory failure (HCC) 07/07/2020  . Other chest pain   . Spontaneous pneumothorax 04/18/2017  . SOB (shortness of breath) 06/05/2016  . Osteopenia 02/12/2016  . Lumbar radiculopathy 01/26/2015  . Chronic respiratory failure with hypoxia (HCC) 04/12/2014  . Obesity (BMI 30-39.9) 02/08/2014  . Allergic rhinitis 09/05/2010  . IBS 09/05/2010  . GERD 02/18/2010  . Barrett's esophagus 02/12/2010  . Upper airway cough syndrome 11/02/2009  . Anxiety state 08/29/2008  . History of colonic polyps 08/29/2008  . PULMONARY SARCOIDOSIS 08/12/2007  . Mild persistent asthma in adult without complication with component of  vcd 08/12/2007  . Diaphragmatic hernia 08/12/2007  . Migraine 08/12/2007    Past Surgical History:  Procedure Laterality Date  . APPENDECTOMY    . BRAVO PH STUDY N/A 10/10/2013   Procedure: BRAVO PH STUDY;  Surgeon: Hart Carwinora M Brodie, MD;  Location: [facility];  Service: Endoscopy;  Laterality: N/A;  placed 27  . BRONCHIAL BIOPSY  1996  . CATARACT EXTRACTION, BILATERAL    . CHEST TUBE INSERTION Right 04/23/2017   Procedure: CHEST TUBE INSERTION;  Surgeon: Kerin PernaVan Trigt, Peter, MD;  [facility];  Service: Thoracic;  Laterality: Right;  . CHOLECYSTECTOMY    . ESOPHAGOGASTRODUODENOSCOPY N/A 10/10/2013   Procedure: ESOPHAGOGASTRODUODENOSCOPY (EGD);  Surgeon: Hart Carwinora M Brodie, MD;  [facility];  Service: Endoscopy;  Laterality: N/A;  pt asthmatic, RA 92% at rest.  Coughing frequently. Wheezes noted in upper lung fields at auscultation. Pt instructed to use home inhaler prior to procedure and placed on supportive O2 @ 2 lpm in pre-procedural. Teaching done ie. use of inhaler. Pt states she ju  . NISSEN FUNDOPLICATION    . ROTATOR CUFF REPAIR     x3(2 on L, 1 on R)  . VIDEO BRONCHOSCOPY WITH INSERTION OF INTERBRONCHIAL VALVE (IBV) N/A 05/08/2017   Procedure: VIDEO BRONCHOSCOPY WITH INSERTION OF INTERBRONCHIAL VALVE (IBV);  Surgeon: Loreli SlotHendrickson, Steven C, MD;  [facility];  Service: Thoracic;  Laterality: N/A;  . VIDEO BRONCHOSCOPY WITH INSERTION OF INTERBRONCHIAL  VALVE (IBV) N/A 07/10/2017   Procedure: VIDEO BRONCHOSCOPY FOR REMOVAL OF ENDOSCOPIC BRONCHIAL VALVE;  Surgeon: [name], MD;  Location: [facility] OR;  Service: Thoracic;  Laterality: N/A;     OB History   No obstetric history on file.     Family History  Adopted: Yes    Social History   Tobacco Use  . Smoking status: Former Smoker    Packs/day: 1.00    Years: 20.00    Pack years: 20.00    Types: Cigarettes    Quit date: 09/08/1978    Years since quitting: 41.8  . Smokeless tobacco: Never Used  Vaping Use  . Vaping  Use: Never used  Substance Use Topics  . Alcohol use: No  . Drug use: No    Home Medications Prior to Admission medications   Medication Sig Start Date End Date Taking? Authorizing Provider  ALOE VERA JUICE PO Take 120 mLs by mouth daily.    Yes [provider]  Ascorbic Acid (VITAMIN C PO) Take 1 tablet by mouth daily. 1000 MG   Yes [provider]  ASTRAGALUS PO Take 1 tablet by mouth daily.    Yes [provider]  Calcium Carbonate-Vitamin D (CALCIUM-D PO) Take 1 tablet by mouth daily.   Yes [provider]  Collagen Hydrolysate POWD Take 1 scoop by mouth daily with supper.    Yes [provider]  Cyanocobalamin (VITAMIN B-12) 5000 MCG TBDP Take 5,000 mcg by mouth daily.   Yes [provider]  cycloSPORINE (RESTASIS) 0.05 % ophthalmic emulsion Place 1 drop into both eyes 2 (two) times daily.    Yes [provider]  dextromethorphan (DELSYM) 30 MG/5ML liquid Take 30 mg by mouth at bedtime as needed for cough.   Yes [provider]  diphenhydrAMINE (BENADRYL) 25 MG tablet Take 25 mg by mouth at bedtime.   Yes [provider]  Fluticasone-Umeclidin-Vilant (TRELEGY ELLIPTA) 100-62.5-25 MCG/INH AEPB Inhale 1 puff into the lungs daily. 06/25/20  Yes Coralyn Helling, MD  furosemide (LASIX) 20 MG tablet Take 1 tablet (20 mg total) by mouth daily. Use as needed for swelling Patient taking differently: Take 20 mg by mouth daily.  01/19/20  Yes Copland, Gwenlyn Found, MD  ipratropium (ATROVENT HFA) 17 MCG/ACT inhaler Inhale 2 puffs into the lungs every 6 (six) hours as needed for wheezing. 06/27/16  Yes Saguier, Ramon Dredge, PA-C  loratadine (CLARITIN) 10 MG tablet Take 10 mg by mouth daily.   Yes [provider]  magnesium 30 MG tablet Take 30 mg by mouth daily.   Yes [provider]  Multiple Vitamin (MULTIVITAMIN WITH MINERALS) TABS tablet Take 1 tablet by mouth daily.   Yes [provider]  Nasal  Moisturizer Combination (RHINASE) SOLN Place 2 sprays into the nose 2 (two) times daily as needed (dry, bloody nostrils).    Yes [provider]  Omega 3-6-9 Fatty Acids (OMEGA 3-6-9 COMPLEX PO) Take 1 capsule by mouth daily.   Yes [provider]  pantoprazole (PROTONIX) 40 MG tablet Take 1 tablet (40 mg total) by mouth 2 (two) times daily. 08/29/19  Yes Armbruster, Willaim Rayas, MD  polyvinyl alcohol (ARTIFICIAL TEARS) 1.4 % ophthalmic solution Place 1 drop into both eyes See admin instructions. Instill 1 drop in to both eyes once or twice daily as needed for dry eyes - in between restasis doses   Yes [provider]  predniSONE (DELTASONE) 5 MG tablet Take 1.5 tablets (7.5 mg total) by  mouth daily with breakfast. 06/25/20  Yes Coralyn Helling, MD  OXYGEN Inhale 2-4 L into the lungs continuous. If moving around it is increased    [provider]  Respiratory Therapy Supplies (FLUTTER) DEVI 1 application by Does not apply route 2 (two) times daily. 06/16/18   Coralyn Helling, MD    Allergies    Gabapentin, Lactose intolerance (gi), Pineapple, Penicillins, and Aspirin  Review of Systems   Review of Systems  Constitutional: Positive for chills and fever.  HENT: Negative for ear pain and sore throat.   Eyes: Negative for pain and visual disturbance.  Respiratory: Positive for cough, shortness of breath and wheezing.   Cardiovascular: Negative for chest pain and palpitations.  Gastrointestinal: Negative for abdominal pain and vomiting.  Genitourinary: Negative for dysuria and hematuria.  Musculoskeletal: Negative for arthralgias and back pain.  Skin: Negative for color change and rash.  Neurological: Negative for syncope and light-headedness.  Psychiatric/Behavioral: Negative for agitation and confusion.  All other systems reviewed and are negative.   Physical Exam Updated Vital Signs BP 118/83   Pulse 98   Temp 98.2 F (36.8 C) (Oral)   Resp 19   SpO2 97%    Physical Exam Vitals and nursing note reviewed.  Constitutional:      General: She is not in acute distress.    Appearance: She is well-developed.  HENT:     Head: Normocephalic and atraumatic.  Eyes:     Conjunctiva/sclera: Conjunctivae normal.  Cardiovascular:     Rate and Rhythm: Regular rhythm. Tachycardia present.     Comments: HR 105 and regular Pulmonary:     Effort: Pulmonary effort is normal. No respiratory distress.     Comments: 98% on 4L [place] at rest, desaturation to 80% with ambulation from bed Speaking in full sentences Diffuse crackles with expiratory wheezing Abdominal:     Palpations: Abdomen is soft.     Tenderness: There is no abdominal tenderness.  Musculoskeletal:     Cervical back: Neck supple.     Right lower leg: No tenderness. No edema.     Left lower leg: No tenderness. No edema.  Skin:    General: Skin is warm and dry.  Neurological:     General: No focal deficit present.     Mental Status: She is alert and oriented to person, place, and time.     ED Results / Procedures / Treatments   Labs (all labs ordered are listed, but only abnormal results are displayed) Labs Reviewed  BASIC METABOLIC PANEL - Abnormal; Notable for the following components:      Result Value   Potassium 5.2 (*)    Creatinine, Ser 1.04 (*)    GFR, Estimated 57 (*)    All other components within normal limits  CBC WITH DIFFERENTIAL/PLATELET - Abnormal; Notable for the following components:   Basophils Absolute 0.2 (*)    All other components within normal limits  I-STAT VENOUS BLOOD GAS, ED - Abnormal; Notable for the following components:   pH, Ven 7.468 (*)    pO2, Ven 59.0 (*)    Bicarbonate 33.3 (*)    TCO2 35 (*)    Acid-Base Excess 8.0 (*)    Potassium 5.9 (*)    Calcium, Ion 1.08 (*)    All other components within normal limits  RESPIRATORY PANEL BY RT PCR (FLU A&B, COVID)  BRAIN NATRIURETIC PEPTIDE  CBC WITH DIFFERENTIAL/PLATELET  POTASSIUM  TROPONIN I  (HIGH SENSITIVITY)    EKG EKG  Interpretation  Date/Time:  Saturday July 07 2020 15:33:53 EDT Ventricular Rate:  125 PR Interval:  172 QRS Duration: 80 QT Interval:  286 QTC Calculation: 412 R Axis:   67 Text Interpretation: Sinus tachycardia Otherwise normal ECG No STEMI Confirmed by Alvester Chou [phone]) on 07/07/2020 4:23:24 PM   Radiology DG Chest 2 View  Result Date: 07/07/2020 CLINICAL DATA:  Shortness of breath EXAM: CHEST - 2 VIEW COMPARISON:  02/01/2019 FINDINGS: Heart is normal size. Diffuse interstitial prominence throughout the lungs, stable since prior study compatible with chronic interstitial lung disease. No definite acute process. No effusions or acute bony abnormality. IMPRESSION: Severe diffuse interstitial prominence compatible chronic interstitial lung disease. Electronically Signed   By: Charlett Nose M.D.   On: 07/07/2020 16:35    Procedures .Critical Care Performed by: Terald Sleeper, MD Authorized by: Terald Sleeper, MD   Critical care provider statement:    Critical care time (minutes):  40   Critical care was necessary to treat or prevent imminent or life-threatening deterioration of the following conditions:  Respiratory failure   Critical care was time spent personally by me on the following activities:  Discussions with consultants, evaluation of patient's response to treatment, examination of patient, ordering and performing treatments and interventions, ordering and review of laboratory studies, ordering and review of radiographic studies, pulse oximetry, re-evaluation of patient's condition, obtaining history from patient or surrogate and review of old charts Comments:     Reactive airway disease and worsening ILD requiring supplemental oxygen, repeat bedside reassessment, IV magnesium and duonebs   (including critical care time)  Medications Ordered in ED Medications  ipratropium-albuterol (DUONEB) 0.5-2.5 (3) MG/3ML nebulizer solution  3 mL (3 mLs Nebulization Given 07/07/20 2122)  doxycycline (VIBRA-TABS) tablet 100 mg (100 mg Oral Not Given 07/08/20 0122)  magnesium sulfate IVPB 2 g 50 mL (0 g Intravenous Stopped 07/07/20 1915)  methylPREDNISolone sodium succinate (SOLU-MEDROL) 125 mg/2 mL injection 125 mg (125 mg Intravenous Given 07/07/20 1856)    ED Course  I have reviewed the triage vital signs and the nursing notes.  Pertinent labs & imaging results that were available during my care of the patient were reviewed by me and considered in my medical decision making (see chart for details).  This patient complains of dyspnea andhypoxia .  This involves an extensive number of treatment options, and is a complaint that carries with it a high risk of complications and morbidity.  The differential diagnosis includes worsening ILD vs asthma/COPD flare vs CHF vs other  With her wheezing on exam I felt this was most likely related to asthma/COPD and ordered IV magnesium, duonebs, and IV solumedrol  I ordered, reviewed, and interpreted labs, which included BMP (K 5.2, Cr 1.04), Flu Covid negative, WBC 8.5, Hgb 13.2, VBG with pH 7.46, pCO2 46, BNP 28. I ordered imaging studies which included xray chest suggestive of interstitial lung disease, severe pattern ECG per my interpretation showed NSR, no acute ischemia  Clinical Course as of Jul 08 130  Sat Jul 07, 2020  2327 With attempted ambulation on home 5L portable O2, she desaturated to 80% and felt extremely winded getting up from the bed.  She will need medical admission.  Her wheezing and subjective SOB improved with duonebs, so there may be a reactive airway disease component to this.  I also felt it was reasonable to treat for CAP with doxycycline at this time.     [MT]  2343 Signed out to hospitalist   [  MT]    Clinical Course User Index [MT] Ytzel Gubler, Kermit Balo, MD    Final Clinical Impression(s) / ED Diagnoses Final diagnoses:  Hypoxia  COPD exacerbation (HCC)    ILD (interstitial lung disease) [facility])    Rx / DC Orders ED Discharge Orders    None       Tyeasha Ebbs, Kermit Balo, MD 07/08/20 0131

## 2020-07-07 NOTE — ED Notes (Signed)
Pt up to bedside commode sob with exertion sats dropped in the 60s when she is exerting hereself  sats increased with rest and nasal 02

## 2020-07-08 ENCOUNTER — Observation Stay (HOSPITAL_COMMUNITY): Payer: Medicare Other

## 2020-07-08 ENCOUNTER — Encounter (HOSPITAL_COMMUNITY): Payer: Self-pay | Admitting: Osteopathic Medicine

## 2020-07-08 ENCOUNTER — Inpatient Hospital Stay (HOSPITAL_COMMUNITY): Payer: Medicare Other

## 2020-07-08 DIAGNOSIS — J9621 Acute and chronic respiratory failure with hypoxia: Secondary | ICD-10-CM

## 2020-07-08 DIAGNOSIS — F411 Generalized anxiety disorder: Secondary | ICD-10-CM | POA: Diagnosis present

## 2020-07-08 DIAGNOSIS — F32A Depression, unspecified: Secondary | ICD-10-CM | POA: Diagnosis present

## 2020-07-08 DIAGNOSIS — J309 Allergic rhinitis, unspecified: Secondary | ICD-10-CM | POA: Diagnosis present

## 2020-07-08 DIAGNOSIS — Z9049 Acquired absence of other specified parts of digestive tract: Secondary | ICD-10-CM | POA: Diagnosis not present

## 2020-07-08 DIAGNOSIS — J45909 Unspecified asthma, uncomplicated: Secondary | ICD-10-CM | POA: Diagnosis not present

## 2020-07-08 DIAGNOSIS — M5416 Radiculopathy, lumbar region: Secondary | ICD-10-CM | POA: Diagnosis present

## 2020-07-08 DIAGNOSIS — K589 Irritable bowel syndrome without diarrhea: Secondary | ICD-10-CM | POA: Diagnosis present

## 2020-07-08 DIAGNOSIS — Z87891 Personal history of nicotine dependence: Secondary | ICD-10-CM | POA: Diagnosis not present

## 2020-07-08 DIAGNOSIS — K449 Diaphragmatic hernia without obstruction or gangrene: Secondary | ICD-10-CM | POA: Diagnosis present

## 2020-07-08 DIAGNOSIS — R079 Chest pain, unspecified: Secondary | ICD-10-CM | POA: Diagnosis not present

## 2020-07-08 DIAGNOSIS — J9690 Respiratory failure, unspecified, unspecified whether with hypoxia or hypercapnia: Secondary | ICD-10-CM | POA: Diagnosis not present

## 2020-07-08 DIAGNOSIS — J849 Interstitial pulmonary disease, unspecified: Secondary | ICD-10-CM | POA: Diagnosis present

## 2020-07-08 DIAGNOSIS — E875 Hyperkalemia: Secondary | ICD-10-CM | POA: Diagnosis present

## 2020-07-08 DIAGNOSIS — Z9981 Dependence on supplemental oxygen: Secondary | ICD-10-CM | POA: Diagnosis not present

## 2020-07-08 DIAGNOSIS — Z20822 Contact with and (suspected) exposure to covid-19: Secondary | ICD-10-CM | POA: Diagnosis present

## 2020-07-08 DIAGNOSIS — Z23 Encounter for immunization: Secondary | ICD-10-CM | POA: Diagnosis present

## 2020-07-08 DIAGNOSIS — D869 Sarcoidosis, unspecified: Secondary | ICD-10-CM | POA: Diagnosis not present

## 2020-07-08 DIAGNOSIS — R0602 Shortness of breath: Secondary | ICD-10-CM | POA: Diagnosis not present

## 2020-07-08 DIAGNOSIS — K219 Gastro-esophageal reflux disease without esophagitis: Secondary | ICD-10-CM | POA: Diagnosis present

## 2020-07-08 DIAGNOSIS — J4531 Mild persistent asthma with (acute) exacerbation: Secondary | ICD-10-CM | POA: Diagnosis present

## 2020-07-08 DIAGNOSIS — J9811 Atelectasis: Secondary | ICD-10-CM | POA: Diagnosis not present

## 2020-07-08 DIAGNOSIS — D860 Sarcoidosis of lung: Secondary | ICD-10-CM | POA: Diagnosis present

## 2020-07-08 DIAGNOSIS — R000 Tachycardia, unspecified: Secondary | ICD-10-CM

## 2020-07-08 DIAGNOSIS — I712 Thoracic aortic aneurysm, without rupture: Secondary | ICD-10-CM | POA: Diagnosis present

## 2020-07-08 DIAGNOSIS — J441 Chronic obstructive pulmonary disease with (acute) exacerbation: Secondary | ICD-10-CM | POA: Diagnosis present

## 2020-07-08 DIAGNOSIS — I272 Pulmonary hypertension, unspecified: Secondary | ICD-10-CM | POA: Diagnosis present

## 2020-07-08 DIAGNOSIS — R040 Epistaxis: Secondary | ICD-10-CM | POA: Diagnosis not present

## 2020-07-08 DIAGNOSIS — I77810 Thoracic aortic ectasia: Secondary | ICD-10-CM

## 2020-07-08 DIAGNOSIS — Z6841 Body Mass Index (BMI) 40.0 and over, adult: Secondary | ICD-10-CM | POA: Diagnosis not present

## 2020-07-08 DIAGNOSIS — I517 Cardiomegaly: Secondary | ICD-10-CM | POA: Diagnosis not present

## 2020-07-08 HISTORY — DX: Thoracic aortic ectasia: I77.810

## 2020-07-08 LAB — COMPREHENSIVE METABOLIC PANEL
ALT: 22 U/L (ref 0–44)
AST: 29 U/L (ref 15–41)
Albumin: 3.4 g/dL — ABNORMAL LOW (ref 3.5–5.0)
Alkaline Phosphatase: 53 U/L (ref 38–126)
Anion gap: 10 (ref 5–15)
BUN: 10 mg/dL (ref 8–23)
CO2: 28 mmol/L (ref 22–32)
Calcium: 9.1 mg/dL (ref 8.9–10.3)
Chloride: 102 mmol/L (ref 98–111)
Creatinine, Ser: 0.99 mg/dL (ref 0.44–1.00)
GFR, Estimated: >60 mL/min (ref >60)
Glucose, Bld: 173 mg/dL — ABNORMAL HIGH (ref 70–99)
Potassium: 4.5 mmol/L (ref 3.5–5.1)
Sodium: 140 mmol/L (ref 135–145)
Total Bilirubin: 0.6 mg/dL (ref 0.3–1.2)
Total Protein: 7.6 g/dL (ref 6.5–8.1)

## 2020-07-08 LAB — CBC WITH DIFFERENTIAL/PLATELET
Abs Immature Granulocytes: 0.02 10*3/uL (ref 0.00–0.07)
Basophils Absolute: 0.1 10*3/uL (ref 0.0–0.1)
Basophils Relative: 1 %
Eosinophils Absolute: 0 10*3/uL (ref 0.0–0.5)
Eosinophils Relative: 0 %
HCT: 38.9 % (ref 36.0–46.0)
Hemoglobin: 12.2 g/dL (ref 12.0–15.0)
Immature Granulocytes: 0 %
Lymphocytes Relative: 14 %
Lymphs Abs: 0.9 10*3/uL (ref 0.7–4.0)
MCH: 29.9 pg (ref 26.0–34.0)
MCHC: 31.4 g/dL (ref 30.0–36.0)
MCV: 95.3 fL (ref 80.0–100.0)
Monocytes Absolute: 0.1 10*3/uL (ref 0.1–1.0)
Monocytes Relative: 1 %
Neutro Abs: 5.4 10*3/uL (ref 1.7–7.7)
Neutrophils Relative %: 84 %
Platelets: 182 10*3/uL (ref 150–400)
RBC: 4.08 MIL/uL (ref 3.87–5.11)
RDW: 13 % (ref 11.5–15.5)
WBC: 6.5 10*3/uL (ref 4.0–10.5)
nRBC: 0 % (ref 0.0–0.2)

## 2020-07-08 LAB — MAGNESIUM: Magnesium: 2.3 mg/dL (ref 1.7–2.4)

## 2020-07-08 LAB — TROPONIN I (HIGH SENSITIVITY): Troponin I (High Sensitivity): 6 ng/L (ref <18)

## 2020-07-08 MED ORDER — IOHEXOL 350 MG/ML SOLN
80.0000 mL | Freq: Once | INTRAVENOUS | Status: AC | PRN
  Administered 2020-07-08: 80 mL via INTRAVENOUS

## 2020-07-08 MED ORDER — AZITHROMYCIN 250 MG PO TABS
500.0000 mg | ORAL_TABLET | Freq: Every day | ORAL | Status: AC
  Administered 2020-07-08 – 2020-07-12 (×5): 500 mg via ORAL
  Filled 2020-07-08 (×5): qty 2

## 2020-07-08 MED ORDER — IPRATROPIUM-ALBUTEROL 0.5-2.5 (3) MG/3ML IN SOLN: 3.0000 mL | RESPIRATORY_TRACT | Status: DC | PRN

## 2020-07-08 MED ORDER — SUCRALFATE 1 GM/10ML PO SUSP: 1.0000 g | Freq: Two times a day (BID) | ORAL | Status: DC

## 2020-07-08 MED ORDER — OXYCODONE HCL 5 MG PO TABS: 5.0000 mg | ORAL_TABLET | ORAL | Status: DC | PRN

## 2020-07-08 MED ORDER — FLUTTER DEVI: 1.0000 "application " | Freq: Two times a day (BID) | Status: DC

## 2020-07-08 MED ORDER — UMECLIDINIUM BROMIDE 62.5 MCG/INH IN AEPB
1.0000 | INHALATION_SPRAY | Freq: Every day | RESPIRATORY_TRACT | Status: DC
  Administered 2020-07-08 – 2020-07-12 (×5): 1 via RESPIRATORY_TRACT
  Filled 2020-07-08 (×2): qty 7

## 2020-07-08 MED ORDER — ALBUTEROL SULFATE HFA 108 (90 BASE) MCG/ACT IN AERS
1.0000 | INHALATION_SPRAY | Freq: Four times a day (QID) | RESPIRATORY_TRACT | Status: DC | PRN
  Filled 2020-07-08: qty 6.7

## 2020-07-08 MED ORDER — POLYETHYLENE GLYCOL 3350 17 G PO PACK: 17.0000 g | PACK | Freq: Every day | ORAL | Status: DC | PRN

## 2020-07-08 MED ORDER — RHINASE NA SOLN: 2.0000 | Freq: Two times a day (BID) | NASAL | Status: DC | PRN

## 2020-07-08 MED ORDER — LORATADINE 10 MG PO TABS
10.0000 mg | ORAL_TABLET | Freq: Every evening | ORAL | Status: DC
  Administered 2020-07-08 – 2020-07-11 (×4): 10 mg via ORAL
  Filled 2020-07-08 (×4): qty 1

## 2020-07-08 MED ORDER — ACETAMINOPHEN 650 MG RE SUPP: 650.0000 mg | Freq: Four times a day (QID) | RECTAL | Status: DC | PRN

## 2020-07-08 MED ORDER — METHYLPREDNISOLONE SODIUM SUCC 125 MG IJ SOLR
60.0000 mg | Freq: Two times a day (BID) | INTRAMUSCULAR | Status: DC
  Administered 2020-07-08 – 2020-07-10 (×5): 60 mg via INTRAVENOUS
  Filled 2020-07-08 (×5): qty 2

## 2020-07-08 MED ORDER — ONDANSETRON HCL 4 MG PO TABS: 4.0000 mg | ORAL_TABLET | Freq: Four times a day (QID) | ORAL | Status: DC | PRN

## 2020-07-08 MED ORDER — FLUTICASONE-UMECLIDIN-VILANT 100-62.5-25 MCG/INH IN AEPB: 1.0000 | INHALATION_SPRAY | Freq: Every day | RESPIRATORY_TRACT | Status: DC

## 2020-07-08 MED ORDER — FLUTICASONE FUROATE-VILANTEROL 100-25 MCG/INH IN AEPB
1.0000 | INHALATION_SPRAY | Freq: Every day | RESPIRATORY_TRACT | Status: DC
  Administered 2020-07-08 – 2020-07-12 (×5): 1 via RESPIRATORY_TRACT
  Filled 2020-07-08: qty 28

## 2020-07-08 MED ORDER — ADULT MULTIVITAMIN W/MINERALS CH
1.0000 | ORAL_TABLET | Freq: Every day | ORAL | Status: DC
  Administered 2020-07-08 – 2020-07-12 (×5): 1 via ORAL
  Filled 2020-07-08 (×5): qty 1

## 2020-07-08 MED ORDER — CYCLOSPORINE 0.05 % OP EMUL
1.0000 [drp] | Freq: Two times a day (BID) | OPHTHALMIC | Status: DC
  Administered 2020-07-08 – 2020-07-12 (×8): 1 [drp] via OPHTHALMIC
  Filled 2020-07-08 (×10): qty 1

## 2020-07-08 MED ORDER — DEXTROMETHORPHAN POLISTIREX ER 30 MG/5ML PO SUER
30.0000 mg | Freq: Two times a day (BID) | ORAL | Status: DC
  Administered 2020-07-08 – 2020-07-11 (×7): 30 mg via ORAL
  Filled 2020-07-08 (×9): qty 5

## 2020-07-08 MED ORDER — PANTOPRAZOLE SODIUM 40 MG PO TBEC
40.0000 mg | DELAYED_RELEASE_TABLET | Freq: Two times a day (BID) | ORAL | Status: DC
  Administered 2020-07-08 – 2020-07-12 (×9): 40 mg via ORAL
  Filled 2020-07-08 (×9): qty 1

## 2020-07-08 MED ORDER — FUROSEMIDE 20 MG PO TABS
20.0000 mg | ORAL_TABLET | Freq: Every day | ORAL | Status: DC
  Administered 2020-07-08 – 2020-07-12 (×5): 20 mg via ORAL
  Filled 2020-07-08 (×5): qty 1

## 2020-07-08 MED ORDER — ONDANSETRON HCL 4 MG/2ML IJ SOLN: 4.0000 mg | Freq: Four times a day (QID) | INTRAMUSCULAR | Status: DC | PRN

## 2020-07-08 MED ORDER — ACETAMINOPHEN 325 MG PO TABS
650.0000 mg | ORAL_TABLET | Freq: Four times a day (QID) | ORAL | Status: DC | PRN
  Administered 2020-07-09 – 2020-07-10 (×2): 650 mg via ORAL
  Filled 2020-07-08 (×3): qty 2

## 2020-07-08 MED ORDER — HEPARIN SODIUM (PORCINE) 5000 UNIT/ML IJ SOLN
5000.0000 [IU] | Freq: Three times a day (TID) | INTRAMUSCULAR | Status: DC
  Administered 2020-07-08 – 2020-07-12 (×13): 5000 [IU] via SUBCUTANEOUS
  Filled 2020-07-08 (×13): qty 1

## 2020-07-08 NOTE — Progress Notes (Signed)
PT Cancellation Note  Patient Details Name: MARCELINE NAPIERALA MRN: [id number] DOB: [birth year]   Cancelled Treatment:    Reason Eval/Treat Not Completed: Patient at procedure or test/unavailable. Pt being taken to CT.    Angelina Ok [facility] 07/08/2020, 3:57 PM Skip Mayer PT Acute Rehabilitation Services Pager [phone] Office [phone]

## 2020-07-08 NOTE — Progress Notes (Signed)
  Brief note - see H&P from today for full details    [name]  ZTI:[id number] DOB: 10/25/1950 DOA: 07/07/2020 PCP: Kiara Cables, MD Outpatient Specialists: [name], pulmonary    Admitted today 07/08/20  Presented to ED with SOB, acute on chronic respiratory failure d/t asthma exacerbation in setting of chronic sarcoidosis   See HPI and ER notes for full details  I saw and examined patient today around 10:00 AM. She was witting up in bed, reported feeling improved but still SOB especially w/ movement. Lungs (+)crackles at bases but no LE edema and heart sounds normal, no wheezing.   Reviewed labs, imaging fromt ime of presenation to ED Reviewed [name] last note  Consult placed for pulmonary by admitting physician but due to late hour pulm was not notified, I took are of this. Pt sees [name] outpatient, last visit 10/2019    Assessment & Plan:   Principal Problem:   PULMONARY SARCOIDOSIS Active Problems:   Anxiety state   Mild persistent asthma in adult without complication with component of vcd   GERD   IBS   Lumbar radiculopathy   Acute on chronic respiratory failure (HCC)    No change to admission orders at this time  Pulm to see patient, appreciate their recs       Kiara Nielsen DO [facility]  Secure message for non-urgent questions/concerns For urgent issues please page - see AMION

## 2020-07-08 NOTE — ED Notes (Signed)
Pt resting in bed, does not appear in distress Respirations are even and non-labored, remains on 4L [place].  Denies pain at this time

## 2020-07-08 NOTE — Consult Note (Signed)
NAME:  [name], MRN:  [id number], DOB:  1951/01/17, LOS: 0 ADMISSION DATE:  07/07/2020, CONSULTATION DATE:  07/08/2020 REFERRING MD:  Dr. Lyn HollingsheadAlexander, [facility], CHIEF COMPLAINT:  Short of breath   Brief History   69 yo female former smoker presented with dyspnea, low grade fever, worsening hypoxia, chest discomfort.  Followed in pulmonary office for Stage 4 pulmonary sarcoidosis on chronic prednisone, asthma, and chronic hypoxic respiratory failure on 3 to 5 liters supplemental oxygen.  History of present illness   She is followed in pulmonary office for sarcoid and asthma.  She is on 7.5 mg prednisone daily and 3 to 5 liters oxygen.  She started getting more short of breath and a cough over past several days.  Not bringing up sputum but felt congested.  Has some sore throat.  Needed more supplemental oxygen to maintain SpO2.  Had low grade temp of 99 to 100F.  Start on solumedrol, nebulizer treatment and antibiotics.  Has some improvement.  Still has chest discomfort and tachycardia.  Past Medical History  Anemia, Anxiety, Barrett's Esophagus, Depression, HH, IBS, PTX  Significant Hospital Events   10/30 Admit  Consults:    Procedures:    Significant Diagnostic Tests:   PFT 08/14/16 >> 1.60 (88%), FEV1% 87, TLC 3.34 (68%), DLCO 35%, +BD  CT chest 09/20/17 >> subpleural reticulation, distortion, honeycombing b/l  HRCT chest 07/20/19 >> atherosclerosis, borderline LAN, extensive cylindrical and varicose BTX, severe peribronchovascular thickening, extensive asymmetric honeycombing  Echo 08/14/16 >> EF 60 to 65%, grade 1 DD, PAS 30 mmHg  CT angio chest 07/08/20 >>   Micro Data:  COVID/Influenza 10/30 >> negative  Antimicrobials:  Zithromax 10/30 >>  Doxycycline 10/30 >>   Interim history/subjective:    Objective   Blood pressure 132/88, pulse (!) 115, temperature 98.2 F (36.8 C), temperature source Oral, resp. rate 15, SpO2 93 %.       No intake or output data in  the 24 hours ending 07/08/20 1433 There were no vitals filed for this visit.  Examination:  General - alert Eyes - pupils reactive ENT - no sinus tenderness, no stridor Cardiac - regular, tachycardic, no murmur Chest - equal breath sounds b/l, faint crackles b/l, no wheeze Abdomen - soft, non tender, + bowel sounds Extremities - no cyanosis, clubbing, or edema Skin - no rashes Neuro - normal strength, moves extremities, follows commands Psych - normal mood and behavior Lymphatics - no lymphadenopathy  Resolved Hospital Problem list     Assessment & Plan:   Acute on chronic hypoxic respiratory failure. - likely from acute respiratory infection in setting of stage 4 sarcoidosis and asthma exacerbation - continue solumedrol 60 mg bid - day 2 of ABx - continue incruse, breo  - prn albuterol - goal SpO2 90 to 95%  Sinus tachycardia. - her symptom constellation could also be associated with pulmonary embolism - will arrange for CT angio chest with PE protocol >> this will also provide information regarding status of her sarcoidosis and whether she has pneumonia   Best practice:  Diet: heart healthy DVT prophylaxis: SQ heparin GI prophylaxis: protonix Mobility: OOB to chair Code Status: full code  Labs   CBC: Recent Labs  Lab 07/07/20 1847 07/07/20 1942 07/08/20 0430  WBC  --  8.5 6.5  NEUTROABS  --  4.9 5.4  HGB 13.9 13.2 12.2  HCT 41.0 43.1 38.9  MCV  --  96.0 95.3  PLT  --  268 182    Basic  Metabolic Panel: Recent Labs  Lab 07/07/20 1835 07/07/20 1847 07/08/20 0405  NA 140 139 140  K 5.2* 5.9* 4.5  CL 101  --  102  CO2 26  --  28  GLUCOSE 88  --  173*  BUN 11  --  10  CREATININE 1.04*  --  0.99  CALCIUM 9.4  --  9.1  MG  --   --  2.3   GFR: CrCl cannot be calculated (Unknown ideal weight.). Recent Labs  Lab 07/07/20 1942 07/08/20 0430  WBC 8.5 6.5    Liver Function Tests: Recent Labs  Lab 07/08/20 0405  AST 29  ALT 22  ALKPHOS 53    BILITOT 0.6  PROT 7.6  ALBUMIN 3.4*   No results for input(s): LIPASE, AMYLASE in the last 168 hours. No results for input(s): AMMONIA in the last 168 hours.  ABG    Component Value Date/Time   HCO3 33.3 (H) 07/07/2020 1847   TCO2 35 (H) 07/07/2020 1847   O2SAT 92.0 07/07/2020 1847     Coagulation Profile: No results for input(s): INR, PROTIME in the last 168 hours.  Cardiac Enzymes: No results for input(s): CKTOTAL, CKMB, CKMBINDEX, TROPONINI in the last 168 hours.  HbA1C: Hgb A1c MFr Bld  Date/Time Value Ref Range Status  09/27/2018 11:49 AM 5.5 4.6 - 6.5 % Final    Comment:    Glycemic Control Guidelines for People with Diabetes:Non Diabetic:  <6%Goal of Therapy: <7%Additional Action Suggested:  >8%     CBG: No results for input(s): GLUCAP in the last 168 hours.  Review of Systems:   Reviewed and negative  Past Medical History  She,  has a past medical history of Anemia, Anxiety, Asthma, Barrett esophagus, Depression, GERD (gastroesophageal reflux disease), Hiatal hernia, IBS (irritable bowel syndrome), Migraines, Pneumothorax, Pulmonary sarcoidosis (HCC), Supplemental oxygen dependent, and Wears dentures.   Surgical History    Past Surgical History:  Procedure Laterality Date  . APPENDECTOMY    . BRAVO PH STUDY N/A 10/10/2013   Procedure: BRAVO PH STUDY;  Surgeon: Hart Carwin, MD;  Location: [facility];  Service: Endoscopy;  Laterality: N/A;  placed 27  . BRONCHIAL BIOPSY  1996  . CATARACT EXTRACTION, BILATERAL    . CHEST TUBE INSERTION Right 04/23/2017   Procedure: CHEST TUBE INSERTION;  Surgeon: [name], MD;  Location: [facility] OR;  Service: Thoracic;  Laterality: Right;  . CHOLECYSTECTOMY    . ESOPHAGOGASTRODUODENOSCOPY N/A 10/10/2013   Procedure: ESOPHAGOGASTRODUODENOSCOPY (EGD);  Surgeon: Hart Carwin, MD;  Location: [facility];  Service: Endoscopy;  Laterality: N/A;  pt asthmatic, RA 92% at rest.  Coughing frequently. Wheezes noted in upper lung  fields at auscultation. Pt instructed to use home inhaler prior to procedure and placed on supportive O2 @ 2 lpm in pre-procedural. Teaching done ie. use of inhaler. Pt states she ju  . NISSEN FUNDOPLICATION    . ROTATOR CUFF REPAIR     x3(2 on L, 1 on R)  . VIDEO BRONCHOSCOPY WITH INSERTION OF INTERBRONCHIAL VALVE (IBV) N/A 05/08/2017   Procedure: VIDEO BRONCHOSCOPY WITH INSERTION OF INTERBRONCHIAL VALVE (IBV);  Surgeon: Loreli Slot, MD;  Location: [facility] OR;  Service: Thoracic;  Laterality: N/A;  . VIDEO BRONCHOSCOPY WITH INSERTION OF INTERBRONCHIAL VALVE (IBV) N/A 07/10/2017   Procedure: VIDEO BRONCHOSCOPY FOR REMOVAL OF ENDOSCOPIC BRONCHIAL VALVE;  Surgeon: [name], MD;  Location: [facility] OR;  Service: Thoracic;  Laterality: N/A;     Social History  reports that she quit smoking about 41 years ago. Her smoking use included cigarettes. She has a 20.00 pack-year smoking history. She has never used smokeless tobacco. She reports that she does not drink alcohol and does not use drugs.   Family History   Her family history is not on file. She was adopted.   Allergies Allergies  Allergen Reactions  . Gabapentin Swelling    EDEMA WITH POSITIVE RECHALLENGE  . Lactose Intolerance (Gi) Diarrhea and Other (See Comments)    Stomach cramps  . Pineapple Hives and Itching  . Penicillins Other (See Comments)    Unknown - childhood allergy Has patient had a PCN reaction causing immediate rash, facial/tongue/throat swelling, SOB or lightheadedness with hypotension: Unknown Has patient had a PCN reaction causing severe rash involving mucus membranes or skin necrosis: Unknown Has patient had a PCN reaction that required hospitalization: Unknown Has patient had a PCN reaction occurring within the last 10 years: No If all of the above answers are "NO", then may proceed with Cephalosporin use.  . Aspirin Other (See Comments)    gastritis     Home Medications  Prior to Admission medications    Medication Sig Start Date End Date Taking? Authorizing Provider  ALOE VERA JUICE PO Take 120 mLs by mouth daily.    Yes [provider]  Ascorbic Acid (VITAMIN C PO) Take 1 tablet by mouth daily. 1000 MG   Yes [provider]  ASTRAGALUS PO Take 1 tablet by mouth daily.    Yes [provider]  Calcium Carbonate-Vitamin D (CALCIUM-D PO) Take 1 tablet by mouth daily.   Yes [provider]  Collagen Hydrolysate POWD Take 1 scoop by mouth daily with supper.    Yes [provider]  Cyanocobalamin (VITAMIN B-12) 5000 MCG TBDP Take 5,000 mcg by mouth daily.   Yes [provider]  cycloSPORINE (RESTASIS) 0.05 % ophthalmic emulsion Place 1 drop into both eyes 2 (two) times daily.    Yes [provider]  dextromethorphan (DELSYM) 30 MG/5ML liquid Take 30 mg by mouth at bedtime as needed for cough.   Yes [provider]  diphenhydrAMINE (BENADRYL) 25 MG tablet Take 25 mg by mouth at bedtime.   Yes [provider]  Fluticasone-Umeclidin-Vilant (TRELEGY ELLIPTA) 100-62.5-25 MCG/INH AEPB Inhale 1 puff into the lungs daily. 06/25/20  Yes Coralyn Helling, MD  furosemide (LASIX) 20 MG tablet Take 1 tablet (20 mg total) by mouth daily. Use as needed for swelling Patient taking differently: Take 20 mg by mouth daily.  01/19/20  Yes Copland, Gwenlyn Found, MD  ipratropium (ATROVENT HFA) 17 MCG/ACT inhaler Inhale 2 puffs into the lungs every 6 (six) hours as needed for wheezing. 06/27/16  Yes Saguier, Ramon Dredge, PA-C  loratadine (CLARITIN) 10 MG tablet Take 10 mg by mouth daily.   Yes [provider]  magnesium 30 MG tablet Take 30 mg by mouth daily.   Yes [provider]  Multiple Vitamin (MULTIVITAMIN WITH MINERALS) TABS tablet Take 1 tablet by mouth daily.   Yes [provider]  Nasal Moisturizer Combination (RHINASE) SOLN Place 2 sprays into the nose 2 (two) times daily as needed (dry, bloody nostrils).    Yes  [provider]  Omega 3-6-9 Fatty Acids (OMEGA 3-6-9 COMPLEX PO) Take 1 capsule by mouth daily.   Yes [provider]  pantoprazole (PROTONIX) 40 MG tablet Take 1 tablet (40 mg total) by mouth 2 (two) times daily. 08/29/19  Yes Armbruster, Willaim Rayas,  MD  polyvinyl alcohol (ARTIFICIAL TEARS) 1.4 % ophthalmic solution Place 1 drop into both eyes See admin instructions. Instill 1 drop in to both eyes once or twice daily as needed for dry eyes - in between restasis doses   Yes [provider]  predniSONE (DELTASONE) 5 MG tablet Take 1.5 tablets (7.5 mg total) by mouth daily with breakfast. 06/25/20  Yes Coralyn Helling, MD  OXYGEN Inhale 2-4 L into the lungs continuous. If moving around it is increased    [provider]  Respiratory Therapy Supplies (FLUTTER) DEVI 1 application by Does not apply route 2 (two) times daily. 06/16/18   Coralyn Helling, MD     Signature:  Coralyn Helling, MD [facility] Pager - [phone] 07/08/2020, 2:47 PM

## 2020-07-08 NOTE — ED Notes (Signed)
Admitting doctor at  The bedside 

## 2020-07-08 NOTE — ED Notes (Signed)
Tele  Breakfast Ordered 

## 2020-07-08 NOTE — Progress Notes (Signed)
Brief note - saw patient at bedside once she was back from CT. Breathing about the same. No other concerns right now.   Reviewed CT results  no PE appreciated though habitus limits exam   Other chronic lung changes c/w hx sarcoidosis  (+) stable aneurysmal dilatation of ascending aorta added to problem list to include in d/c summary for PCP to follow up on annual imaging w/ CTA or MRA to monitor    Sunnie Nielsen DO [facility]

## 2020-07-08 NOTE — H&P (Signed)
TRH H&P    Patient Demographics:    [name], is a 69 y.o. female  MRN: [id number]  DOB - December 04, 1950  Admit Date - 07/07/2020  Referring MD/NP/PA: trifan  Outpatient Primary MD for the patient is Copland, Gwenlyn Found, MD  Patient coming from: Home  Chief complaint- dyspnea   HPI:    [name]  is a 69 y.o. female, with history of pulmonary sarcoidosis, pneumothorax, IBS, GERD, asthma, and more presents to the ED with a chief complaint of dyspnea.  Patient reports that the dyspnea was gradual in onset about 1 to 2 weeks ago.  She reports that she wears 2 to 8 L oxygen at home depending on if she is at rest or if she is exerting herself.  Up until last 1 to 2 weeks patient has been able to move around the house without a problem.  Husband reports that patient can come out of the bedroom, walk to the kitchen, and function in the kitchen at baseline.  They both report that with exertion even with her higher levels of oxygen her O2 sats drop but only into the 80s, and then she quickly recovers when she is at rest.  Over the last 1 to 2 weeks her oxygen saturations have been dropping to 61%.  Is been getting progressively worse, meaning that less and less exertion is causing it to drop that low.  This is on the higher 8 L nasal cannula as well.  Patient reports that she has been using Vicks vapor rub when this happens because she feels like it opens her lungs more.  She is only used her rescue inhaler once and it did not offer much relief.  Today it got to the point where patient can only take 2 steps before her oxygen saturations dropped to 70.  She was concerned that she might pass out if her oxygen saturations are staying that low.  She also reports that it took her longer to recover to bring her oxygen sats back up.  She would tripod and use pursed lip breathing to get her oxygen saturations to come back up.  She  reports associated wheezing that started 3 or 4 days ago.  She reports that this is been intermittent, worse with exertion, and getting worse over the few days.  Patient has not had a cough, chest pain, palpitations.  She does report that she has subjective fevers at home but has seen a T-max of 99.5.  She has not had any nausea, vomiting, abdominal pain, pain or burning when she pees, or any other concern on review of systems.  In the ED Temperature 98.2, heart rate 91-121, respiratory rate 16, maintaining saturations on 2 L nasal cannula as long as she does not talk, and requiring 4 L nasal cannula to maintain her oxygen saturations if she is talking. White blood cell count 8.5, hemoglobin 13.2 CHEM panel reveals a hyperkalemia at 5.9, repeat blood draw pending VBG shows a pH of 7.46, CO2 of 46, O2 of 59 on the 4 L  nasal cannula BNP is 28.7 Negative flu and Covid Chest x-ray shows severe diffuse interstitial prominence compatible with chronic interstitial disease Doxycycline was given in the ED as well as mag 2 g, Solu-Medrol, and duo nebs. EKG showed a heart rate of 125 sinus tach with a QTC of 412     Review of systems:    In addition to the HPI above,  Admits to subjective fevers, No Headache, No changes with Vision or hearing, No problems swallowing food or Liquids, No Chest pain, Cough admits to severe shortness of breath, No Abdominal pain, No Nausea or Vomiting, bowel movements are regular, No Blood in stool or Urine, No dysuria, No new skin rashes or bruises, No new joints pains-aches,  No new weakness, tingling, numbness in any extremity, No recent weight gain or loss, No polyuria, polydypsia or polyphagia, No significant Mental Stressors.  All other systems reviewed and are negative.    Past History of the following :    Past Medical History:  Diagnosis Date  . Anemia   . Anxiety   . Asthma   . Barrett esophagus   . Depression   . GERD (gastroesophageal reflux  disease)   . Hiatal hernia   . IBS (irritable bowel syndrome)   . Migraines   . Pneumothorax   . Pulmonary sarcoidosis (HCC)   . Supplemental oxygen dependent   . Wears dentures       Past Surgical History:  Procedure Laterality Date  . APPENDECTOMY    . BRAVO PH STUDY N/A 10/10/2013   Procedure: BRAVO PH STUDY;  Surgeon: Hart Carwinora M Brodie, MD;  Location: [facility];  Service: Endoscopy;  Laterality: N/A;  placed 27  . BRONCHIAL BIOPSY  1996  . CATARACT EXTRACTION, BILATERAL    . CHEST TUBE INSERTION Right 04/23/2017   Procedure: CHEST TUBE INSERTION;  Surgeon: Kerin PernaVan Trigt, Peter, MD;  [facility];  Service: Thoracic;  Laterality: Right;  . CHOLECYSTECTOMY    . ESOPHAGOGASTRODUODENOSCOPY N/A 10/10/2013   Procedure: ESOPHAGOGASTRODUODENOSCOPY (EGD);  Surgeon: Hart Carwinora M Brodie, MD;  [facility];  Service: Endoscopy;  Laterality: N/A;  pt asthmatic, RA 92% at rest.  Coughing frequently. Wheezes noted in upper lung fields at auscultation. Pt instructed to use home inhaler prior to procedure and placed on supportive O2 @ 2 lpm in pre-procedural. Teaching done ie. use of inhaler. Pt states she ju  . NISSEN FUNDOPLICATION    . ROTATOR CUFF REPAIR     x3(2 on L, 1 on R)  . VIDEO BRONCHOSCOPY WITH INSERTION OF INTERBRONCHIAL VALVE (IBV) N/A 05/08/2017   Procedure: VIDEO BRONCHOSCOPY WITH INSERTION OF INTERBRONCHIAL VALVE (IBV);  Surgeon: Loreli SlotHendrickson, Steven C, MD;  [facility];  Service: Thoracic;  Laterality: N/A;  . VIDEO BRONCHOSCOPY WITH INSERTION OF INTERBRONCHIAL VALVE (IBV) N/A 07/10/2017   Procedure: VIDEO BRONCHOSCOPY FOR REMOVAL OF ENDOSCOPIC BRONCHIAL VALVE;  Surgeon: Kerin PernaVan Trigt, Peter, MD;  [facility];  Service: Thoracic;  Laterality: N/A;      Social History:      Social History   Tobacco Use  . Smoking status: Former Smoker    Packs/day: 1.00    Years: 20.00    Pack years: 20.00    Types: Cigarettes    Quit date: 09/08/1978    Years since quitting: 41.8  .  Smokeless tobacco: Never Used  Substance Use Topics  . Alcohol use: No       Family History :     Family History  Adopted:  Yes   Unknown family history born in [place] raised in [place]   Home Medications:   Prior to Admission medications   Medication Sig Start Date End Date Taking? Authorizing Provider  ALOE VERA JUICE PO Take 120 mLs by mouth daily.    Yes [provider]  Ascorbic Acid (VITAMIN C PO) Take 1 tablet by mouth daily. 1000 MG   Yes [provider]  ASTRAGALUS PO Take 1 tablet by mouth daily.    Yes [provider]  Calcium Carbonate-Vitamin D (CALCIUM-D PO) Take 1 tablet by mouth daily.   Yes [provider]  Collagen Hydrolysate POWD Take 1 scoop by mouth daily with supper.    Yes [provider]  Cyanocobalamin (VITAMIN B-12) 5000 MCG TBDP Take 5,000 mcg by mouth daily.   Yes [provider]  cycloSPORINE (RESTASIS) 0.05 % ophthalmic emulsion Place 1 drop into both eyes 2 (two) times daily.    Yes [provider]  dextromethorphan (DELSYM) 30 MG/5ML liquid Take 30 mg by mouth at bedtime as needed for cough.   Yes [provider]  diphenhydrAMINE (BENADRYL) 25 MG tablet Take 25 mg by mouth at bedtime.   Yes [provider]  Fluticasone-Umeclidin-Vilant (TRELEGY ELLIPTA) 100-62.5-25 MCG/INH AEPB Inhale 1 puff into the lungs daily. 06/25/20  Yes Coralyn Helling, MD  furosemide (LASIX) 20 MG tablet Take 1 tablet (20 mg total) by mouth daily. Use as needed for swelling Patient taking differently: Take 20 mg by mouth daily.  01/19/20  Yes Copland, Gwenlyn Found, MD  ipratropium (ATROVENT HFA) 17 MCG/ACT inhaler Inhale 2 puffs into the lungs every 6 (six) hours as needed for wheezing. 06/27/16  Yes Saguier, Ramon Dredge, PA-C  loratadine (CLARITIN) 10 MG tablet Take 10 mg by mouth daily.   Yes [provider]  magnesium 30 MG tablet Take 30 mg by mouth daily.   Yes [provider]  Multiple  Vitamin (MULTIVITAMIN WITH MINERALS) TABS tablet Take 1 tablet by mouth daily.   Yes [provider]  Nasal Moisturizer Combination (RHINASE) SOLN Place 2 sprays into the nose 2 (two) times daily as needed (dry, bloody nostrils).    Yes [provider]  Omega 3-6-9 Fatty Acids (OMEGA 3-6-9 COMPLEX PO) Take 1 capsule by mouth daily.   Yes [provider]  pantoprazole (PROTONIX) 40 MG tablet Take 1 tablet (40 mg total) by mouth 2 (two) times daily. 08/29/19  Yes Armbruster, Willaim Rayas, MD  polyvinyl alcohol (ARTIFICIAL TEARS) 1.4 % ophthalmic solution Place 1 drop into both eyes See admin instructions. Instill 1 drop in to both eyes once or twice daily as needed for dry eyes - in between restasis doses   Yes [provider]  predniSONE (DELTASONE) 5 MG tablet Take 1.5 tablets (7.5 mg total) by mouth daily with breakfast. 06/25/20  Yes Coralyn Helling, MD  OXYGEN Inhale 2-4 L into the lungs continuous. If moving around it is increased    [provider]  Respiratory Therapy Supplies (FLUTTER) DEVI 1 application by Does not apply route 2 (two) times daily. 06/16/18   Coralyn Helling, MD     Allergies:     Allergies  Allergen Reactions  . Gabapentin Swelling    EDEMA WITH POSITIVE RECHALLENGE  . Lactose Intolerance (Gi) Diarrhea and Other (See Comments)    Stomach cramps  . Pineapple Hives and Itching  . Penicillins Other (See Comments)    Unknown - childhood allergy Has patient had a PCN reaction causing  immediate rash, facial/tongue/throat swelling, SOB or lightheadedness with hypotension: Unknown Has patient had a PCN reaction causing severe rash involving mucus membranes or skin necrosis: Unknown Has patient had a PCN reaction that required hospitalization: Unknown Has patient had a PCN reaction occurring within the last 10 years: No If all of the above answers are "NO", then may proceed with Cephalosporin use.  . Aspirin Other (See Comments)     gastritis     Physical Exam:   Vitals  Blood pressure 118/83, pulse 98, temperature 98.2 F (36.8 C), temperature source Oral, resp. rate 19, SpO2 97 %.  1.  General: Lying supine in bed with head of bed elevated, pausing conversation to use pursed lip breathing to catch her breath  2. Psychiatric: Mood and behavior normal for situation, pleasant, cooperative with exam, good insight to healthcare  3. Neurologic: Cranial nerves II through XII are grossly intact, moves all 4 extremities voluntarily, no focal deficit on limited exam  4. HEENMT:  Head is atraumatic, normocephalic, pupils are reactive to light, neck is supple, trachea is midline, mucous membranes are moist  5. Respiratory : Fine crackles in the lower lung fields similar to Velcro-like breath sounds, no wheezing, no rhonchi, no cyanosis  6. Cardiovascular : Heart rate is normal, rhythm is regular, no murmurs rubs or gallops  7. Gastrointestinal:  Abdomen is obese, soft, nondistended, nontender to palpation  8. Skin:  Skin is dry, no acute lesion on limited skin exam  9.Musculoskeletal:  No peripheral edema, no calf tenderness, no acute deformity    Data Review:    CBC Recent Labs  Lab 07/07/20 1847 07/07/20 1942  WBC  --  8.5  HGB 13.9 13.2  HCT 41.0 43.1  PLT  --  268  MCV  --  96.0  MCH  --  29.4  MCHC  --  30.6  RDW  --  13.0  LYMPHSABS  --  2.7  MONOABS  --  0.9  EOSABS  --  0.1  BASOSABS  --  0.2*   ------------------------------------------------------------------------------------------------------------------  Results for orders placed or performed during the hospital encounter of 07/07/20 (from the past 48 hour(s))  Respiratory Panel by RT PCR (Flu A&B, Covid) - Nasopharyngeal Swab     Status: None   Collection Time: 07/07/20  6:01 PM   Specimen: Nasopharyngeal Swab  Result Value Ref Range   SARS Coronavirus 2 by RT PCR NEGATIVE NEGATIVE    Comment: (NOTE) SARS-CoV-2 target  nucleic acids are NOT DETECTED.  The SARS-CoV-2 RNA is generally detectable in upper respiratoy specimens during the acute phase of infection. The lowest concentration of SARS-CoV-2 viral copies this assay can detect is 131 copies/mL. A negative result does not preclude SARS-Cov-2 infection and should not be used as the sole basis for treatment or other patient management decisions. A negative result may occur with  improper specimen collection/handling, submission of specimen other than nasopharyngeal swab, presence of viral mutation(s) within the areas targeted by this assay, and inadequate number of viral copies (<131 copies/mL). A negative result must be combined with clinical observations, patient history, and epidemiological information. The expected result is Negative.  Fact Sheet for Patients:  https://www.moore.com/  Fact Sheet for Healthcare Providers:  https://www.young.biz/  This test is no t yet approved or cleared by the [place] FDA and  has been authorized for detection and/or diagnosis of SARS-CoV-2 by FDA under an Emergency Use Authorization (EUA). This EUA will remain  in effect (meaning this test can  be used) for the duration of the COVID-19 declaration under Section 564(b)(1) of the Act, 21 U.S.C. section 360bbb-3(b)(1), unless the authorization is terminated or revoked sooner.     Influenza A by PCR NEGATIVE NEGATIVE   Influenza B by PCR NEGATIVE NEGATIVE    Comment: (NOTE) The Xpert Xpress SARS-CoV-2/FLU/RSV assay is intended as an aid in  the diagnosis of influenza from Nasopharyngeal swab specimens and  should not be used as a sole basis for treatment. Nasal washings and  aspirates are unacceptable for Xpert Xpress SARS-CoV-2/FLU/RSV  testing.  Fact Sheet for Patients: https://www.moore.com/  Fact Sheet for Healthcare Providers: https://www.young.biz/  This test is  not yet approved or cleared by the [place] FDA and  has been authorized for detection and/or diagnosis of SARS-CoV-2 by  FDA under an Emergency Use Authorization (EUA). This EUA will remain  in effect (meaning this test can be used) for the duration of the  Covid-19 declaration under Section 564(b)(1) of the Act, 21  U.S.C. section 360bbb-3(b)(1), unless the authorization is  terminated or revoked. Performed at [facility], [address], [place], [place] [address]   Basic metabolic panel     Status: Abnormal   Collection Time: 07/07/20  6:35 PM  Result Value Ref Range   Sodium 140 135 - 145 mmol/L   Potassium 5.2 (H) 3.5 - 5.1 mmol/L   Chloride 101 98 - 111 mmol/L   CO2 26 22 - 32 mmol/L   Glucose, Bld 88 70 - 99 mg/dL    Comment: Glucose reference range applies only to samples taken after fasting for at least 8 hours.   BUN 11 8 - 23 mg/dL   Creatinine, Ser 7.25 (H) 0.44 - 1.00 mg/dL   Calcium 9.4 8.9 - 36.6 mg/dL   GFR, Estimated 57 (L) >60 mL/min    Comment: (NOTE) Calculated using the CKD-EPI Creatinine Equation (2021)    Anion gap 13 5 - 15    Comment: Performed at [facility], [address], [place], [place] [address]  I-Stat venous blood gas, ED     Status: Abnormal   Collection Time: 07/07/20  6:47 PM  Result Value Ref Range   pH, Ven 7.468 (H) 7.25 - 7.43   pCO2, Ven 46.0 44 - 60 mmHg   pO2, Ven 59.0 (H) 32 - 45 mmHg   Bicarbonate 33.3 (H) 20.0 - 28.0 mmol/L   TCO2 35 (H) 22 - 32 mmol/L   O2 Saturation 92.0 %   Acid-Base Excess 8.0 (H) 0.0 - 2.0 mmol/L   Sodium 139 135 - 145 mmol/L   Potassium 5.9 (H) 3.5 - 5.1 mmol/L   Calcium, Ion 1.08 (L) 1.15 - 1.40 mmol/L   HCT 41.0 36 - 46 %   Hemoglobin 13.9 12.0 - 15.0 g/dL   Sample type VENOUS   Brain natriuretic peptide     Status: None   Collection Time: 07/07/20  7:42 PM  Result Value Ref Range   B Natriuretic Peptide 28.7 0.0 - 100.0 pg/mL    Comment: Performed at [facility],  [address], [place], [place] [address]  CBC with Differential/Platelet     Status: Abnormal   Collection Time: 07/07/20  7:42 PM  Result Value Ref Range   WBC 8.5 4.0 - 10.5 K/uL   RBC 4.49 3.87 - 5.11 MIL/uL   Hemoglobin 13.2 12.0 - 15.0 g/dL   HCT 56.3 36 - 46 %   MCV 96.0 80.0 -  100.0 fL   MCH 29.4 26.0 - 34.0 pg   MCHC 30.6 30.0 - 36.0 g/dL   RDW 57.8 46.9 - 62.9 %   Platelets 268 150 - 400 K/uL   nRBC 0.0 0.0 - 0.2 %   Neutrophils Relative % 55 %   Neutro Abs 4.9 1.7 - 7.7 K/uL   Lymphocytes Relative 31 %   Lymphs Abs 2.7 0.7 - 4.0 K/uL   Monocytes Relative 11 %   Monocytes Absolute 0.9 0.1 - 1.0 K/uL   Eosinophils Relative 1 %   Eosinophils Absolute 0.1 0.0 - 0.5 K/uL   Basophils Relative 2 %   Basophils Absolute 0.2 (H) 0.0 - 0.1 K/uL   Immature Granulocytes 0 %   Abs Immature Granulocytes 0.03 0.00 - 0.07 K/uL    Comment: Performed at [facility], [address], [place], [place] [address]    Chemistries  Recent Labs  Lab 07/07/20 1835 07/07/20 1847  NA 140 139  K 5.2* 5.9*  CL 101  --   CO2 26  --   GLUCOSE 88  --   BUN 11  --   CREATININE 1.04*  --   CALCIUM 9.4  --    ------------------------------------------------------------------------------------------------------------------  ------------------------------------------------------------------------------------------------------------------ GFR: CrCl cannot be calculated (Unknown ideal weight.). Liver Function Tests: No results for input(s): AST, ALT, ALKPHOS, BILITOT, PROT, ALBUMIN in the last 168 hours. No results for input(s): LIPASE, AMYLASE in the last 168 hours. No results for input(s): AMMONIA in the last 168 hours. Coagulation Profile: No results for input(s): INR, PROTIME in the last 168 hours. Cardiac Enzymes: No results for input(s): CKTOTAL, CKMB, CKMBINDEX, TROPONINI in the last 168 hours. BNP (last 3 results) No results for input(s): PROBNP in the last 8760 hours. HbA1C: No  results for input(s): HGBA1C in the last 72 hours. CBG: No results for input(s): GLUCAP in the last 168 hours. Lipid Profile: No results for input(s): CHOL, HDL, LDLCALC, TRIG, CHOLHDL, LDLDIRECT in the last 72 hours. Thyroid Function Tests: No results for input(s): TSH, T4TOTAL, FREET4, T3FREE, THYROIDAB in the last 72 hours. Anemia Panel: No results for input(s): VITAMINB12, FOLATE, FERRITIN, TIBC, IRON, RETICCTPCT in the last 72 hours.  --------------------------------------------------------------------------------------------------------------- Urine analysis:    Component Value Date/Time   COLORURINE AMBER (A) 05/10/2017 1600   APPEARANCEUR HAZY (A) 05/10/2017 1600   LABSPEC 1.029 05/10/2017 1600   PHURINE 5.0 05/10/2017 1600   GLUCOSEU NEGATIVE 05/10/2017 1600   HGBUR SMALL (A) 05/10/2017 1600   BILIRUBINUR SMALL (A) 05/10/2017 1600   BILIRUBINUR Negaitve 03/20/2016 1158   KETONESUR NEGATIVE 05/10/2017 1600   PROTEINUR 100 (A) 05/10/2017 1600   UROBILINOGEN negative 03/20/2016 1158   NITRITE NEGATIVE 05/10/2017 1600   LEUKOCYTESUR NEGATIVE 05/10/2017 1600      Imaging Results:    DG Chest 2 View  Result Date: 07/07/2020 CLINICAL DATA:  Shortness of breath EXAM: CHEST - 2 VIEW COMPARISON:  02/01/2019 FINDINGS: Heart is normal size. Diffuse interstitial prominence throughout the lungs, stable since prior study compatible with chronic interstitial lung disease. No definite acute process. No effusions or acute bony abnormality. IMPRESSION: Severe diffuse interstitial prominence compatible chronic interstitial lung disease. Electronically Signed   By: Charlett Nose M.D.   On: 07/07/2020 16:35    My personal review of EKG: Rhythm NSR, Rate 125 /min, QTc 412 ,no Acute ST changes   Assessment & Plan:    Active Problems:   Acute on chronic respiratory failure (HCC)   1. Acute on chronic  respiratory failure 1. On 2 L at rest at baseline 2. Requiring 4 L at rest at  baseline 3. Secondary to asthma exacerbation 4. Wheezing on presentation, improved with duo nebs and Solu-Medrol 5. Oxygen sat still dropping with ambulation 6. Continue Solu-Medrol 60 mg twice daily, continue duo nebs, start Zithromax 7. Wean off O2 as tolerated 8. Monitor on telemetry 2. Asthma exacerbation 1. In the setting of pulmonary sarcoidosis-consult pulmonology 2. Continue Solu-Medrol, duo nebs, Zithromax as above 3. Chest x-ray shows known interstitial lung disease, repeat chest x-ray in a.m. to assess for any developing pneumonia  3. Hyperkalemia 1. Repeat blood draw as the hyperkalemia was 6 hours ago 2. We will treat accordingly 3. Trend in the a.m. as well    DVT Prophylaxis-  heparin - SCDs   AM Labs Ordered, also please review Full Orders  Family Communication: Admission, patients condition and plan of care including tests being ordered have been discussed with the patient and husband who indicate understanding and agree with the plan and Code Status.  Code Status:  Full Admission status: Observation     Time spent in minutes : 64   Kailani Brass B Zierle-Ghosh DO

## 2020-07-08 NOTE — Plan of Care (Signed)
  Problem: Nutrition: Goal: Adequate nutrition will be maintained Outcome: Progressing   Problem: Coping: Goal: Level of anxiety will decrease Outcome: Progressing   

## 2020-07-09 DIAGNOSIS — D869 Sarcoidosis, unspecified: Secondary | ICD-10-CM | POA: Diagnosis not present

## 2020-07-09 DIAGNOSIS — J9621 Acute and chronic respiratory failure with hypoxia: Secondary | ICD-10-CM | POA: Diagnosis not present

## 2020-07-09 DIAGNOSIS — J45909 Unspecified asthma, uncomplicated: Secondary | ICD-10-CM | POA: Diagnosis not present

## 2020-07-09 DIAGNOSIS — R000 Tachycardia, unspecified: Secondary | ICD-10-CM | POA: Diagnosis not present

## 2020-07-09 MED ORDER — INFLUENZA VAC A&B SA ADJ QUAD 0.5 ML IM PRSY
0.5000 mL | PREFILLED_SYRINGE | INTRAMUSCULAR | Status: AC
  Administered 2020-07-11: 0.5 mL via INTRAMUSCULAR
  Filled 2020-07-09: qty 0.5

## 2020-07-09 MED ORDER — ALBUTEROL SULFATE HFA 108 (90 BASE) MCG/ACT IN AERS
1.0000 | INHALATION_SPRAY | Freq: Four times a day (QID) | RESPIRATORY_TRACT | Status: DC | PRN
  Filled 2020-07-09: qty 6.7

## 2020-07-09 NOTE — Progress Notes (Signed)
Patient Mews changed to yellow, vitals to be monitored q2hrs and MD and charge nurse notified.   07/09/20 1412  Assess: MEWS Score  Temp 98.6 F (37 C)  BP (!) 136/92  Pulse Rate (!) 111  Resp 16  SpO2 98 %  Assess: MEWS Score  MEWS Temp 0  MEWS Systolic 0  MEWS Pulse 2  MEWS RR 0  MEWS LOC 0  MEWS Score 2  MEWS Score Color Yellow  Treat  MEWS Interventions Other (Comment) (monitoring)  Pain Scale 0-10  Pain Score 0  Take Vital Signs  Increase Vital Sign Frequency  Yellow: Q 2hr X 2 then Q 4hr X 2, if remains yellow, continue Q 4hrs  Escalate  MEWS: Escalate Yellow: discuss with charge nurse/RN and consider discussing with provider and RRT  Notify: Charge Nurse/RN  Name of Charge Nurse/RN Notified Sam  Date Charge Nurse/RN Notified 07/09/20  Time Charge Nurse/RN Notified 1448  Document  Patient Outcome Other (Comment) (more frequent monitoring)  Progress note created (see row info) Yes

## 2020-07-09 NOTE — Progress Notes (Signed)
PROGRESS NOTE  [name]  ION:[id number] DOB: 12/15/1950 DOA: 07/07/2020 PCP: Pearline Cables, MD   Chief Complaint  Patient presents with  . Shortness of Breath   Brief Narrative: 69 year old female with history of pulmonary sarcoidosis, pneumothorax, IBS, GERD, asthma, chronic hypoxic respiratory failure using 2 to 8 L oxygen at home depending upon her activity/level of exertion presented to ED 10/31st with progressively worsening shortness of breath, dyspnea on exertion, low-grade fever 99-100, hypoxia with activity. Seen in the ED, negative for flu and Covid, chest x-ray with severe diffuse interstitial prominence compatible with chronic interstitial lung disease, patient was admitted, seen by pulmonary CT angio chest repeated, was limited but no PE, showed persistent extensive areas of bronchiectasis bilaterally superimposed on background of groundglass opacification throughout both lungs consistent with patient's history of sarcoidosis.  Subjective: Seen this morning.  Reports he is feeling better overall.  No chest pain fever chills.   Assessment & Plan:  Acute on chronic hypoxic aspiratory failure, on 2-8 L [place] at home based on level of activity: Suspecting acute respiratory infection in the setting of stage IV sarcoidosis and asthma exacerbation.  CT angio reviewed no new acute finding.  Appreciate pulmonary input, continue IV antibiotics, IV steroids solumedrol  60 mg every 12 hours supplemental oxygen, bronchodilator.  Maintain saturation 90 to 95%.  Elevated pulmonary hypertension/dilated pulmonary artery on CTA.  Ascending aortic aneurysm 4.2 cm, stable, will need annual follow-up CT or MRI  Stage IV sarcoidosis: Continue steroids and other bronchodilators as #1  Anxiety state:  Mild persistent asthma with acute exacerbation continue IV steroids as #1.  Cont bronchodilators.   GERD: Continue Protonix.  IBS-cont prn Stool softener  Lumbar  radiculopathy-stable  Nutrition: Diet Order            Diet Heart Room service appropriate? Yes; Fluid consistency: Thin  Diet effective now                 Body mass index is 39.84 kg/m. DVT prophylaxis: heparin injection 5,000 Units Start: 07/08/20 0600 SCDs Start: 07/08/20 0405 Code Status:   Code Status: Full Code  Family Communication: plan of care discussed with patient at bedside.  Status is: Inpatient Remains inpatient appropriate because:IV treatments appropriate due to intensity of illness or inability to take PO and Inpatient level of care appropriate due to severity of illness  Dispo: The patient is from: Home              Anticipated d/c is to: Home              Anticipated d/c date is: 3 days              Patient currently is not medically stable to d/c.  Consultants:see note  Procedures:  CTA 10/31 1. Evaluation is limited by patient body habitus. Given this limitation, there was no definite acute pulmonary embolism. 2. Again noted are persistent findings of the patient's reported history of sarcoidosis as detailed above. 3. Cardiomegaly. 4. Dilated main pulmonary artery which can be seen in patients with elevated pulmonary artery pressures. 5. Stable aneurysmal dilatation of the ascending aorta measuring up to 4.2 cm. Recommend annual imaging followup by CTA or MRA. This recommendation follows 2010 ACCF/AHA/AATS/ACR/ASA/SCA/SCAI/SIR/STS/SVM Guidelines for the Diagnosis and Management of Patients with Thoracic Aortic Disease. Circulation. 2010; 121: K440-N027. Aortic aneurysm NOS (ICD10-I71.9) 6. Persistent extensive areas of bronchiectasis bilaterally superimposed on a background of ground-glass opacification throughout both lung fields. Findings are consistent with  the patient's history of sarcoidosis  Culture/Microbiology    Component Value Date/Time   SDES BLOOD LEFT HAND 05/05/2017 2339   SPECREQUEST IN PEDIATRIC BOTTLE Blood Culture adequate  volume 05/05/2017 2339   CULT NO GROWTH 5 DAYS 05/05/2017 2339   REPTSTATUS 05/11/2017 FINAL 05/05/2017 2339    Other culture-see note  Medications: Scheduled Meds: . azithromycin  500 mg Oral Daily  . cycloSPORINE  1 drop Both Eyes BID  . dextromethorphan  30 mg Oral BID  . doxycycline  100 mg Oral Once  . fluticasone furoate-vilanterol  1 puff Inhalation Daily  . furosemide  20 mg Oral Daily  . heparin  5,000 Units Subcutaneous Q8H  . [START ON 07/10/2020] influenza vaccine adjuvanted  0.5 mL Intramuscular Tomorrow-1000  . loratadine  10 mg Oral QPM  . methylPREDNISolone (SOLU-MEDROL) injection  60 mg Intravenous Q12H  . multivitamin with minerals  1 tablet Oral Daily  . pantoprazole  40 mg Oral BID  . umeclidinium bromide  1 puff Inhalation Daily   Continuous Infusions:  Antimicrobials: Anti-infectives (From admission, onward)   Start     Dose/Rate Route Frequency Ordered Stop   07/08/20 1000  azithromycin (ZITHROMAX) tablet 500 mg        500 mg Oral Daily 07/08/20 0404     07/07/20 2245  doxycycline (VIBRA-TABS) tablet 100 mg        100 mg Oral  Once 07/07/20 2236       Objective: Vitals: Today's Vitals   07/08/20 1800 07/08/20 1940 07/08/20 2128 07/09/20 0500  BP:   137/77   Pulse:   98   Resp:   16   Temp:   97.8 F (36.6 C)   TempSrc:      SpO2:   99%   Weight:    108.6 kg  PainSc: 0-No pain 0-No pain     No intake or output data in the 24 hours ending 07/09/20 0834 Filed Weights   07/09/20 0500  Weight: 108.6 kg   Weight change:   Intake/Output from previous day: No intake/output data recorded. Intake/Output this shift: No intake/output data recorded.  Examination: General exam: AAOx3 ,NAD, weak appearing. HEENT:Oral mucosa moist, Ear/Nose WNL grossly,dentition normal. Respiratory system: bilaterally faint crackles bilaterally,no wheezing or crackles,no use of accessory muscle, non tender. Cardiovascular system: S1 & S2 +, regular, No  JVD. Gastrointestinal system: Abdomen soft, NT,ND, BS+. Nervous System:Alert, awake, moving extremities and grossly nonfocal Extremities: No edema, distal peripheral pulses palpable.  Skin: No rashes,no icterus. MSK: Normal muscle bulk,tone, power  Data Reviewed: I have personally reviewed following labs and imaging studies CBC: Recent Labs  Lab 07/07/20 1847 07/07/20 1942 07/08/20 0430  WBC  --  8.5 6.5  NEUTROABS  --  4.9 5.4  HGB 13.9 13.2 12.2  HCT 41.0 43.1 38.9  MCV  --  96.0 95.3  PLT  --  268 182   Basic Metabolic Panel: Recent Labs  Lab 07/07/20 1835 07/07/20 1847 07/08/20 0405  NA 140 139 140  K 5.2* 5.9* 4.5  CL 101  --  102  CO2 26  --  28  GLUCOSE 88  --  173*  BUN 11  --  10  CREATININE 1.04*  --  0.99  CALCIUM 9.4  --  9.1  MG  --   --  2.3   GFR: CrCl cannot be calculated (Unknown ideal weight.). Liver Function Tests: Recent Labs  Lab 07/08/20 0405  AST 29  ALT 22  ALKPHOS  53  BILITOT 0.6  PROT 7.6  ALBUMIN 3.4*   No results for input(s): LIPASE, AMYLASE in the last 168 hours. No results for input(s): AMMONIA in the last 168 hours. Coagulation Profile: No results for input(s): INR, PROTIME in the last 168 hours. Cardiac Enzymes: No results for input(s): CKTOTAL, CKMB, CKMBINDEX, TROPONINI in the last 168 hours. BNP (last 3 results) No results for input(s): PROBNP in the last 8760 hours. HbA1C: No results for input(s): HGBA1C in the last 72 hours. CBG: No results for input(s): GLUCAP in the last 168 hours. Lipid Profile: No results for input(s): CHOL, HDL, LDLCALC, TRIG, CHOLHDL, LDLDIRECT in the last 72 hours. Thyroid Function Tests: No results for input(s): TSH, T4TOTAL, FREET4, T3FREE, THYROIDAB in the last 72 hours. Anemia Panel: No results for input(s): VITAMINB12, FOLATE, FERRITIN, TIBC, IRON, RETICCTPCT in the last 72 hours. Sepsis Labs: No results for input(s): PROCALCITON, LATICACIDVEN in the last 168 hours.  Recent  Results (from the past 240 hour(s))  Respiratory Panel by RT PCR (Flu A&B, Covid) - Nasopharyngeal Swab     Status: None   Collection Time: 07/07/20  6:01 PM   Specimen: Nasopharyngeal Swab  Result Value Ref Range Status   SARS Coronavirus 2 by RT PCR NEGATIVE NEGATIVE Final    Comment: (NOTE) SARS-CoV-2 target nucleic acids are NOT DETECTED.  The SARS-CoV-2 RNA is generally detectable in upper respiratoy specimens during the acute phase of infection. The lowest concentration of SARS-CoV-2 viral copies this assay can detect is 131 copies/mL. A negative result does not preclude SARS-Cov-2 infection and should not be used as the sole basis for treatment or other patient management decisions. A negative result may occur with  improper specimen collection/handling, submission of specimen other than nasopharyngeal swab, presence of viral mutation(s) within the areas targeted by this assay, and inadequate number of viral copies (<131 copies/mL). A negative result must be combined with clinical observations, patient history, and epidemiological information. The expected result is Negative.  Fact Sheet for Patients:  https://www.moore.com/https://www.fda.gov/media/142436/download  Fact Sheet for Healthcare Providers:  https://www.young.biz/https://www.fda.gov/media/142435/download  This test is no t yet approved or cleared by the [place] FDA and  has been authorized for detection and/or diagnosis of SARS-CoV-2 by FDA under an Emergency Use Authorization (EUA). This EUA will remain  in effect (meaning this test can be used) for the duration of the COVID-19 declaration under Section 564(b)(1) of the Act, 21 U.S.C. section 360bbb-3(b)(1), unless the authorization is terminated or revoked sooner.     Influenza A by PCR NEGATIVE NEGATIVE Final   Influenza B by PCR NEGATIVE NEGATIVE Final    Comment: (NOTE) The Xpert Xpress SARS-CoV-2/FLU/RSV assay is intended as an aid in  the diagnosis of influenza from Nasopharyngeal swab  specimens and  should not be used as a sole basis for treatment. Nasal washings and  aspirates are unacceptable for Xpert Xpress SARS-CoV-2/FLU/RSV  testing.  Fact Sheet for Patients: https://www.moore.com/https://www.fda.gov/media/142436/download  Fact Sheet for Healthcare Providers: https://www.young.biz/https://www.fda.gov/media/142435/download  This test is not yet approved or cleared by the [place] FDA and  has been authorized for detection and/or diagnosis of SARS-CoV-2 by  FDA under an Emergency Use Authorization (EUA). This EUA will remain  in effect (meaning this test can be used) for the duration of the  Covid-19 declaration under Section 564(b)(1) of the Act, 21  U.S.C. section 360bbb-3(b)(1), unless the authorization is  terminated or revoked. Performed at [facility], [address] [place], [place] [address]  Radiology Studies: DG Chest 2 View  Result Date: 07/07/2020 CLINICAL DATA:  Shortness of breath EXAM: CHEST - 2 VIEW COMPARISON:  02/01/2019 FINDINGS: Heart is normal size. Diffuse interstitial prominence throughout the lungs, stable since prior study compatible with chronic interstitial lung disease. No definite acute process. No effusions or acute bony abnormality. IMPRESSION: Severe diffuse interstitial prominence compatible chronic interstitial lung disease. Electronically Signed   By: Charlett Nose M.D.   On: 07/07/2020 16:35   CT ANGIO CHEST PE W OR WO CONTRAST  Result Date: 07/08/2020 CLINICAL DATA:  Chest pain.  Shortness of breath. EXAM: CT ANGIOGRAPHY CHEST WITH CONTRAST TECHNIQUE: Multidetector CT imaging of the chest was performed using the standard protocol during bolus administration of intravenous contrast. Multiplanar CT image reconstructions and MIPs were obtained to evaluate the vascular anatomy. CONTRAST:  64mL OMNIPAQUE IOHEXOL 350 MG/ML SOLN COMPARISON:  July 19, 2019. FINDINGS: Cardiovascular: Evaluation is limited by patient body habitus. Given this limitation,  there was no definite acute pulmonary embolism. There is suboptimal opacification of the segmental and subsegmental branches. This finding is bilateral and symmetric and favored to be secondary to mixing artifact or contrast timing. The main pulmonary artery is significantly dilated measuring up to approximately 3.8 cm. There is aneurysmal dilatation of the ascending aorta measuring approximately 4.2 cm. There is no evidence for thoracic aortic dissection. The heart size is enlarged. Mediastinum/Nodes: --mediastinal adenopathy is again noted. This is relatively similar to prior study. --extensive hilar adenopathy is again noted, grossly similar to prior study. -- No axillary lymphadenopathy. -- No supraclavicular lymphadenopathy. -- Normal thyroid gland where visualized. -  Unremarkable esophagus. Lungs/Pleura: Again noted are extensive areas of bronchiectasis bilaterally superimposed on a background of ground-glass opacification throughout both lung fields. There is no pneumothorax. No definite focal infiltrate or large pleural effusion. There is atelectasis at the lung bases. The trachea is unremarkable. Upper Abdomen: Contrast bolus timing is not optimized for evaluation of the abdominal organs. The visualized portions of the organs of the upper abdomen are normal. Musculoskeletal: No chest wall abnormality. No bony spinal canal stenosis. Review of the MIP images confirms the above findings. IMPRESSION: 1. Evaluation is limited by patient body habitus. Given this limitation, there was no definite acute pulmonary embolism. 2. Again noted are persistent findings of the patient's reported history of sarcoidosis as detailed above. 3. Cardiomegaly. 4. Dilated main pulmonary artery which can be seen in patients with elevated pulmonary artery pressures. 5. Stable aneurysmal dilatation of the ascending aorta measuring up to 4.2 cm. Recommend annual imaging followup by CTA or MRA. This recommendation follows 2010  ACCF/AHA/AATS/ACR/ASA/SCA/SCAI/SIR/STS/SVM Guidelines for the Diagnosis and Management of Patients with Thoracic Aortic Disease. Circulation. 2010; 121: T016-W109. Aortic aneurysm NOS (ICD10-I71.9) 6. Persistent extensive areas of bronchiectasis bilaterally superimposed on a background of ground-glass opacification throughout both lung fields. Findings are consistent with the patient's history of sarcoidosis. Aortic Atherosclerosis (ICD10-I70.0). Electronically Signed   By: Katherine Mantle M.D.   On: 07/08/2020 16:45   Portable chest 1 View  Result Date: 07/08/2020 CLINICAL DATA:  Acute on chronic respiratory failure. EXAM: PORTABLE CHEST 1 VIEW COMPARISON:  07/07/2020 and older exams. FINDINGS: Cardiac silhouette is normal in size. No mediastinal masses. Enlarged hila consistent with adenopathy as noted on previous chest CTs. Extensive irregular interstitial thickening, unchanged from the prior exam, consistent with fibrosis. No lung consolidation to suggest pneumonia. No pleural effusion or pneumothorax. IMPRESSION: 1. No change from the previous day's study. 2. Extensive changes of sarcoidosis and  interstitial lung disease. Electronically Signed   By: Amie Portland M.D.   On: 07/08/2020 06:06     LOS: 1 day   Lanae Boast, MD [facility]  07/09/2020, 8:34 AM

## 2020-07-09 NOTE — Evaluation (Signed)
Physical Therapy Evaluation Patient Details Name: [name] MRN: [id number] DOB: Feb 02, 1951 Today's Date: 07/09/2020   History of Present Illness  Pt adm with acute on chronic respiratory failure and asthma exacerbation. Covid and flu test negative.  PMH - pulmonary sarcoidosis, pneumothorax, asthma.  Clinical Impression  Pt presents to PT with decreased functional mobility and gait secondary to generalized weakness and lack of ability to maintain SpO2 levels in an appropriate range. Pt however has good safety awareness and ability to regulate herself with her SpO2 levels during activity. PT would benefit from skilled therapy in order to work on getting up the stairs so that she can get home, as well as increase her general endurance and strength.    Follow Up Recommendations Home health PT    Equipment Recommendations  3in1 (PT)    Recommendations for Other Services       Precautions / Restrictions        Mobility  Bed Mobility Overal bed mobility: Modified Independent             General bed mobility comments: increased time to recover sats with mobility 99%>89%; HR was around 120 bpm during mobilization Patient Response: Cooperative  Transfers Overall transfer level: Modified independent               General transfer comment: pt able to stand from EOB and pause in standing with pursed lip breathing SpO2 94>87% with standing. Pt needed to sit back down for about 2 minutes to get SpO2 up to around 84%  Ambulation/Gait Ambulation/Gait assistance: Modified independent (Device/Increase time) Gait Distance (Feet): 12 Feet   Gait Pattern/deviations: Decreased stride length;Shuffle   Gait velocity interpretation: <1.31 ft/sec, indicative of household ambulator General Gait Details: pt was on 6L of O2 on portable tank; pt performed pursed lip breathing during gait; SpO2 was stayed at about 93%; HR was maintained around 125 bpm  Stairs             Wheelchair Mobility    Modified Rankin (Stroke Patients Only)       Balance Overall balance assessment: No apparent balance deficits (not formally assessed)                                           Pertinent Vitals/Pain Pain Assessment: No/denies pain    Home Living Family/patient expects to be discharged to:: Private residence Living Arrangements: Spouse/significant other   Type of Home: House Home Access: Stairs to enter   Secretary/administrator of Steps: 15 Home Layout: One level Home Equipment: Emergency planning/management officer - 4 wheels      Prior Function Level of Independence: Needs assistance      ADL's / Homemaking Assistance Needed: assist for house keeping,increaesed time for bathing and dressing        Hand Dominance        Extremity/Trunk Assessment   Upper Extremity Assessment Upper Extremity Assessment: Generalized weakness    Lower Extremity Assessment Lower Extremity Assessment: Generalized weakness    Cervical / Trunk Assessment Cervical / Trunk Assessment: Normal  Communication   Communication: No difficulties  Cognition Arousal/Alertness: Awake/alert Behavior During Therapy: WFL for tasks assessed/performed Overall Cognitive Status: Within Functional Limits for tasks assessed  General Comments      Exercises     Assessment/Plan    PT Assessment Patient needs continued PT services  PT Problem List Decreased activity tolerance;Cardiopulmonary status limiting activity;Decreased strength       PT Treatment Interventions Gait training;Functional mobility training;Therapeutic exercise;Therapeutic activities;Stair training    PT Goals (Current goals can be found in the Care Plan section)  Acute Rehab PT Goals Patient Stated Goal: return to home and games on tablet PT Goal Formulation: With patient Time For Goal Achievement: 07/23/20 Potential to Achieve Goals:  Good Additional Goals Additional Goal #1: Pt will perform transfers while consistently maintaining SpO2 above 88%    Frequency Min 3X/week   Barriers to discharge        Co-evaluation               AM-PAC PT "6 Clicks" Mobility  Outcome Measure Help needed turning from your back to your side while in a flat bed without using bedrails?: A Little Help needed moving from lying on your back to sitting on the side of a flat bed without using bedrails?: A Little Help needed moving to and from a bed to a chair (including a wheelchair)?: None Help needed standing up from a chair using your arms (e.g., wheelchair or bedside chair)?: None Help needed to walk in hospital room?: A Little Help needed climbing 3-5 steps with a railing? : A Lot 6 Click Score: 19    End of Session   Activity Tolerance: Treatment limited secondary to medical complications (Comment) (pt limited by SpO2) Patient left: in chair;with call bell/phone within reach Nurse Communication: Mobility status PT Visit Diagnosis: Other (comment);Muscle weakness (generalized) (M62.81) (difficulty with functional mobility and walking secondary to cardiopulmonary status)    Time: 3710-6269 PT Time Calculation (min) (ACUTE ONLY): 33 min   Charges:   PT Evaluation $PT Eval Moderate Complexity: 1 Mod          Dequavion Follette, SPT [id number]  Aleja Yearwood 07/09/2020, 11:12 AM

## 2020-07-09 NOTE — Progress Notes (Signed)
NAME:  [name], MRN:  [id number], DOB:  12/25/1950, LOS: 1 ADMISSION DATE:  07/07/2020, CONSULTATION DATE:  07/08/2020 REFERRING MD:  Dr. Lyn Hollingshead, [facility], CHIEF COMPLAINT:  Short of breath   Brief History   69 yo female former smoker presented with dyspnea, low grade fever, worsening hypoxia, chest discomfort.  Followed in pulmonary office for Stage 4 pulmonary sarcoidosis on chronic prednisone, asthma, and chronic hypoxic respiratory failure on 3 to 5 liters supplemental oxygen.  History of present illness   She is followed in pulmonary office for sarcoid and asthma.  She is on 7.5 mg prednisone daily and 3 to 5 liters oxygen.  She started getting more short of breath and a cough over past several days.  Not bringing up sputum but felt congested.  Has some sore throat.  Needed more supplemental oxygen to maintain SpO2.  Had low grade temp of 99 to 100F.  Start on solumedrol, nebulizer treatment and antibiotics.  Has some improvement.  Still has chest discomfort and tachycardia.  Past Medical History  Anemia, Anxiety, Barrett's Esophagus, Depression, HH, IBS, PTX  Significant Hospital Events   10/30 Admit  Consults:    Procedures:    Significant Diagnostic Tests:   PFT 08/14/16 >> 1.60 (88%), FEV1% 87, TLC 3.34 (68%), DLCO 35%, +BD  CT chest 09/20/17 >> subpleural reticulation, distortion, honeycombing b/l  HRCT chest 07/20/19 >> atherosclerosis, borderline LAN, extensive cylindrical and varicose BTX, severe peribronchovascular thickening, extensive asymmetric honeycombing  Echo 08/14/16 >> EF 60 to 65%, grade 1 DD, PAS 30 mmHg  CT angio chest 07/08/20 reviewed personally by me>> No acute pulmonary emboli. Chronic varicose bronchiectasis, honeycombing, bilateral GGO   Micro Data:  COVID/Influenza 10/30 >> negative  Antimicrobials:  Zithromax 10/30 >>  Doxycycline 10/30 >>   Interim history/subjective:  Reports improved breathing. Sitting in chair  Objective     Blood pressure (!) 135/93, pulse 100, temperature 98.6 F (37 C), temperature source Oral, resp. rate 16, height 5\' 4"  (1.626 m), weight 108.6 kg, SpO2 98 %.       No intake or output data in the 24 hours ending 07/09/20 1723 Filed Weights   07/09/20 0500  Weight: 108.6 kg   Physical Exam: General: Well-appearing, no acute distress HENT: [place], AT, OP clear, MMM Eyes: EOMI, no scleral icterus Respiratory: Diminished breath sounds bilaterally. Faint crackles and rare wheeze Cardiovascular: RRR, -M/R/G, no JVD Extremities:-Edema,-tenderness Neuro: AAO x4, CNII-XII grossly intact Skin: Intact, no rashes or bruising Psych: Normal mood, normal affect  Resolved Hospital Problem list     Assessment & Plan:   Acute on chronic hypoxic respiratory failure Fibrotic sarcoid - Likely from acute respiratory infection in setting of stage 4 sarcoidosis and asthma exacerbation. Underlying sarcoid on CT overall stable - Continue solumedrol 60 mg bid - Day 3 of ABx - Continue incruse, breo  - prn albuterol - goal SpO2 90 to 95%  Sinus tachycardia - CTA negative pulmonary emboli  - Telemetry  Best practice:  Diet: heart healthy DVT prophylaxis: SQ heparin GI prophylaxis: protonix Mobility: OOB to chair Code Status: full code  Labs   CBC: Recent Labs  Lab 07/07/20 1847 07/07/20 1942 07/08/20 0430  WBC  --  8.5 6.5  NEUTROABS  --  4.9 5.4  HGB 13.9 13.2 12.2  HCT 41.0 43.1 38.9  MCV  --  96.0 95.3  PLT  --  268 182    Basic Metabolic Panel: Recent Labs  Lab 07/07/20 1835 07/07/20 1847 07/08/20  0405  NA 140 139 140  K 5.2* 5.9* 4.5  CL 101  --  102  CO2 26  --  28  GLUCOSE 88  --  173*  BUN 11  --  10  CREATININE 1.04*  --  0.99  CALCIUM 9.4  --  9.1  MG  --   --  2.3   GFR: Estimated Creatinine Clearance: 62.8 mL/min (by C-G formula based on SCr of 0.99 mg/dL). Recent Labs  Lab 07/07/20 1942 07/08/20 0430  WBC 8.5 6.5    Liver Function Tests: Recent Labs   Lab 07/08/20 0405  AST 29  ALT 22  ALKPHOS 53  BILITOT 0.6  PROT 7.6  ALBUMIN 3.4*   No results for input(s): LIPASE, AMYLASE in the last 168 hours. No results for input(s): AMMONIA in the last 168 hours.  ABG    Component Value Date/Time   HCO3 33.3 (H) 07/07/2020 1847   TCO2 35 (H) 07/07/2020 1847   O2SAT 92.0 07/07/2020 1847     Coagulation Profile: No results for input(s): INR, PROTIME in the last 168 hours.  Cardiac Enzymes: No results for input(s): CKTOTAL, CKMB, CKMBINDEX, TROPONINI in the last 168 hours.  HbA1C: Hgb A1c MFr Bld  Date/Time Value Ref Range Status  09/27/2018 11:49 AM 5.5 4.6 - 6.5 % Final    Comment:    Glycemic Control Guidelines for People with Diabetes:Non Diabetic:  <6%Goal of Therapy: <7%Additional Action Suggested:  >8%     CBG: No results for input(s): GLUCAP in the last 168 hours.   Signature:  Care Time: 15 min  Reviewed prior documentation, coordinating care and discussing medical diagnosis and plan with the patient/family. Imaging, labs and tests included in this note have been reviewed and interpreted independently by me.  Mechele Collin, M.D. [facility] Pulmonary/Critical Care Medicine 07/09/2020 5:23 PM

## 2020-07-10 ENCOUNTER — Inpatient Hospital Stay: Payer: Medicare Other

## 2020-07-10 DIAGNOSIS — J9621 Acute and chronic respiratory failure with hypoxia: Secondary | ICD-10-CM | POA: Diagnosis not present

## 2020-07-10 DIAGNOSIS — D869 Sarcoidosis, unspecified: Secondary | ICD-10-CM | POA: Diagnosis not present

## 2020-07-10 DIAGNOSIS — Z23 Encounter for immunization: Secondary | ICD-10-CM

## 2020-07-10 DIAGNOSIS — R000 Tachycardia, unspecified: Secondary | ICD-10-CM | POA: Diagnosis not present

## 2020-07-10 DIAGNOSIS — J45909 Unspecified asthma, uncomplicated: Secondary | ICD-10-CM | POA: Diagnosis not present

## 2020-07-10 MED ORDER — PREDNISONE 20 MG PO TABS
40.0000 mg | ORAL_TABLET | Freq: Every day | ORAL | Status: DC
  Administered 2020-07-11 – 2020-07-12 (×2): 40 mg via ORAL
  Filled 2020-07-10 (×2): qty 2

## 2020-07-10 NOTE — Progress Notes (Signed)
NAME:  [name], MRN:  [id number], DOB:  1951-02-14, LOS: 2 ADMISSION DATE:  07/07/2020, CONSULTATION DATE:  07/08/2020 REFERRING MD:  Dr. Lyn Hollingshead, [facility], CHIEF COMPLAINT:  Short of breath   Brief History   69 yo female former smoker presented with dyspnea, low grade fever, worsening hypoxia, chest discomfort.  Followed in pulmonary office for Stage 4 pulmonary sarcoidosis on chronic prednisone, asthma, and chronic hypoxic respiratory failure on 3 to 5 liters supplemental oxygen.  History of present illness   She is followed in pulmonary office for sarcoid and asthma.  She is on 7.5 mg prednisone daily and 3 to 5 liters oxygen.  She started getting more short of breath and a cough over past several days.  Not bringing up sputum but felt congested.  Has some sore throat.  Needed more supplemental oxygen to maintain SpO2.  Had low grade temp of 99 to 100F.  Start on solumedrol, nebulizer treatment and antibiotics.  Has some improvement.  Still has chest discomfort and tachycardia.  Past Medical History  Anemia, Anxiety, Barrett's Esophagus, Depression, HH, IBS, PTX  Significant Hospital Events   10/30 Admit  Consults:    Procedures:    Significant Diagnostic Tests:  PFT 08/14/16 >> 1.60 (88%), FEV1% 87, TLC 3.34 (68%), DLCO 35%, +BD CT chest 09/20/17 >> subpleural reticulation, distortion, honeycombing b/l HRCT chest 07/20/19 >> atherosclerosis, borderline LAN, extensive cylindrical and varicose BTX, severe peribronchovascular thickening, extensive asymmetric honeycombing Echo 08/14/16 >> EF 60 to 65%, grade 1 DD, PAS 30 mmHg CT angio chest 07/08/20 >> No acute pulmonary emboli. Chronic varicose bronchiectasis, honeycombing, bilateral GGO   Micro Data:  COVID/Influenza 10/30 >> negative  Antimicrobials:  Zithromax 10/30 >>  Doxycycline 10/30 >>   Interim history/subjective:  Sitting in bed. Had episode of dyspnea but improved. On 4L  Objective   Blood pressure (!)  140/97, pulse 95, temperature 98.5 F (36.9 C), resp. rate 18, height 5\' 4"  (1.626 m), weight 107.3 kg, SpO2 97 %.       No intake or output data in the 24 hours ending 07/10/20 1053 Filed Weights   07/09/20 0500 07/10/20 0416  Weight: 108.6 kg 107.3 kg   Physical Exam: General: Well-appearing, no acute distress HENT: [place], AT, OP clear, MMM Eyes: EOMI, no scleral icterus Respiratory: Diminished breath sounds. Bibasilar crackles. Rare wheeze Cardiovascular: RRR, -M/R/G, no JVD Extremities:-Edema,-tenderness Neuro: AAO x4, CNII-XII grossly intact Skin: Intact, no rashes or bruising Psych: Normal mood, normal affect  Resolved Hospital Problem list     Assessment & Plan:   Acute on chronic hypoxic respiratory failure Fibrotic sarcoid - Likely from acute respiratory infection in setting of stage 4 sarcoidosis and asthma exacerbation. Underlying sarcoid on CT overall stable - Transition from solumedrol to prednisone 40 mg daily. Please send home on taper (decrease by 10 mg every 3 days) - Complete five days of azithromycin - Continue incruse, breo  - prn albuterol - goal SpO2 90 to 95%  Sinus tachycardia - CTA negative pulmonary emboli  - Telemetry  Pulmonary will follow intermittently. Will arrange outpatient follow-up with Dr. 13/02/21 1-2 weeks  Best practice:  Diet: heart healthy DVT prophylaxis: SQ heparin GI prophylaxis: protonix Mobility: OOB to chair Code Status: full code  Labs   CBC: Recent Labs  Lab 07/07/20 1847 07/07/20 1942 07/08/20 0430  WBC  --  8.5 6.5  NEUTROABS  --  4.9 5.4  HGB 13.9 13.2 12.2  HCT 41.0 43.1 38.9  MCV  --  96.0 95.3  PLT  --  268 182    Basic Metabolic Panel: Recent Labs  Lab 07/07/20 1835 07/07/20 1847 07/08/20 0405  NA 140 139 140  K 5.2* 5.9* 4.5  CL 101  --  102  CO2 26  --  28  GLUCOSE 88  --  173*  BUN 11  --  10  CREATININE 1.04*  --  0.99  CALCIUM 9.4  --  9.1  MG  --   --  2.3   GFR: Estimated Creatinine  Clearance: 62.3 mL/min (by C-G formula based on SCr of 0.99 mg/dL). Recent Labs  Lab 07/07/20 1942 07/08/20 0430  WBC 8.5 6.5    Liver Function Tests: Recent Labs  Lab 07/08/20 0405  AST 29  ALT 22  ALKPHOS 53  BILITOT 0.6  PROT 7.6  ALBUMIN 3.4*   No results for input(s): LIPASE, AMYLASE in the last 168 hours. No results for input(s): AMMONIA in the last 168 hours.  ABG    Component Value Date/Time   HCO3 33.3 (H) 07/07/2020 1847   TCO2 35 (H) 07/07/2020 1847   O2SAT 92.0 07/07/2020 1847     Coagulation Profile: No results for input(s): INR, PROTIME in the last 168 hours.  Cardiac Enzymes: No results for input(s): CKTOTAL, CKMB, CKMBINDEX, TROPONINI in the last 168 hours.  HbA1C: Hgb A1c MFr Bld  Date/Time Value Ref Range Status  09/27/2018 11:49 AM 5.5 4.6 - 6.5 % Final    Comment:    Glycemic Control Guidelines for People with Diabetes:Non Diabetic:  <6%Goal of Therapy: <7%Additional Action Suggested:  >8%     CBG: No results for input(s): GLUCAP in the last 168 hours.   Signature:  Care Time: 25 min  Mechele Collin, M.D. [facility] Pulmonary/Critical Care Medicine 07/10/2020 10:53 AM

## 2020-07-10 NOTE — Progress Notes (Signed)
07/10/2020 Verbal consent given by pt and provider Kc Ramash.  Dorothea Glassman, RN-BSN-CCM   Covid-19 Vaccination Clinic  Name:  [name]    MRN: [id number] DOB: 1951/03/02  07/10/2020  Ms. Vinluan was observed post Covid-19 immunization for 15 minutes without incident. She was provided with Vaccine Information Sheet and instruction to access the V-Safe system.   Ms. Zoeller was instructed to call 911 with any severe reactions post vaccine: Marland Kitchen Difficulty breathing  . Swelling of face and throat  . A fast heartbeat  . A bad rash all over body  . Dizziness and weakness   Immunizations Administered    Name Date Dose VIS Date Route   Pfizer COVID-19 Vaccine 07/10/2020 11:19 AM 0.3 mL 06/27/2020 Intramuscular   Manufacturer: [employer], [employer]   Lot: [id number]   NDC: [address]-7

## 2020-07-10 NOTE — Progress Notes (Signed)
PROGRESS NOTE  [name]  EHO:[id number] DOB: 1950-12-01 DOA: 07/07/2020 PCP: Pearline Cables, MD   Chief Complaint  Patient presents with  . Shortness of Breath   Brief Narrative: 69 year old female with history of pulmonary sarcoidosis, pneumothorax, IBS, GERD, asthma, chronic hypoxic respiratory failure using 2 to 8 L oxygen at home depending upon her activity/level of exertion presented to ED 10/31st with progressively worsening shortness of breath, dyspnea on exertion, low-grade fever 99-100, hypoxia with activity. Seen in the ED, negative for flu and Covid, chest x-ray with severe diffuse interstitial prominence compatible with chronic interstitial lung disease, patient was admitted, seen by pulmonary CT angio chest repeated, was limited but no PE, showed persistent extensive areas of bronchiectasis bilaterally superimposed on background of groundglass opacification throughout both lungs consistent with patient's history of sarcoidosis.  Subjective: Seen and examined this morning.  Patient reports an episode with coughing and subsequently had a lot of wheezing and after bronchodilators and treatment she feels better this morning. Afebrile overnight. Resting booster for her Pfizer covid vaccine  Assessment & Plan:  Acute on chronic hypoxic aspiratory failure/acute asthma exacerbation in the setting of stage IV sarcoidosis: on 2-8 L [place] at home based on level of activity: Suspecting acute respiratory infection in the setting of stage IV sarcoidosis and asthma exacerbation. CT angio reviewed no new acute finding.  Appreciate pulmonary input: Continue antibiotics, azithromycin, Solu-Medrol 60 mg twice daily, supplemental oxygen, bronchodilator.  Cont plan as per pulmonary.  Elevated pulmonary hypertension/dilated pulmonary artery on CTA.  Ascending aortic aneurysm 4.2 cm, stable, will need annual follow-up CT or MRI.Monitor BP  Stage IV sarcoidosis: Continue steroids improvement  of liters as above.    Anxiety state: Mood is stable.  Mild persistent asthma with acute exacerbation continue systemic steroids number milliliters as #1.  GERD: Continue PPI  IBS-continue supportive measures  Lumbar radiculopathy-stable  COVID vaccination booster requested per request  Nutrition: Diet Order            Diet Heart Room service appropriate? Yes; Fluid consistency: Thin  Diet effective now                 Body mass index is 40.6 kg/m. DVT prophylaxis: heparin injection 5,000 Units Start: 07/08/20 0600 SCDs Start: 07/08/20 0405 Code Status:   Code Status: Full Code  Family Communication: plan of care discussed with patient at bedside.  Status is: Inpatient Remains inpatient appropriate because:IV treatments appropriate due to intensity of illness or inability to take PO and Inpatient level of care appropriate due to severity of illness  Dispo: The patient is from: Home              Anticipated d/c is to: Home              Anticipated d/c date is: 2 days              Patient currently is not medically stable to d/c.  Consultants:see note  Procedures:  CTA 10/31 1. Evaluation is limited by patient body habitus. Given this limitation, there was no definite acute pulmonary embolism. 2. Again noted are persistent findings of the patient's reported history of sarcoidosis as detailed above. 3. Cardiomegaly. 4. Dilated main pulmonary artery which can be seen in patients with elevated pulmonary artery pressures. 5. Stable aneurysmal dilatation of the ascending aorta measuring up to 4.2 cm. Recommend annual imaging followup by CTA or MRA. This recommendation follows 2010 ACCF/AHA/AATS/ACR/ASA/SCA/SCAI/SIR/STS/SVM Guidelines for the Diagnosis and Management of  Patients with Thoracic Aortic Disease. Circulation. 2010; 121: Z610-R604: E266-e369. Aortic aneurysm NOS (ICD10-I71.9) 6. Persistent extensive areas of bronchiectasis bilaterally superimposed on a background of  ground-glass opacification throughout both lung fields. Findings are consistent with the patient's history of sarcoidosis  Culture/Microbiology    Component Value Date/Time   SDES BLOOD LEFT HAND 05/05/2017 2339   SPECREQUEST IN PEDIATRIC BOTTLE Blood Culture adequate volume 05/05/2017 2339   CULT NO GROWTH 5 DAYS 05/05/2017 2339   REPTSTATUS 05/11/2017 FINAL 05/05/2017 2339    Other culture-see note  Medications: Scheduled Meds: . azithromycin  500 mg Oral Daily  . cycloSPORINE  1 drop Both Eyes BID  . dextromethorphan  30 mg Oral BID  . doxycycline  100 mg Oral Once  . fluticasone furoate-vilanterol  1 puff Inhalation Daily  . furosemide  20 mg Oral Daily  . heparin  5,000 Units Subcutaneous Q8H  . influenza vaccine adjuvanted  0.5 mL Intramuscular Tomorrow-1000  . loratadine  10 mg Oral QPM  . methylPREDNISolone (SOLU-MEDROL) injection  60 mg Intravenous Q12H  . multivitamin with minerals  1 tablet Oral Daily  . pantoprazole  40 mg Oral BID  . umeclidinium bromide  1 puff Inhalation Daily   Continuous Infusions:  Antimicrobials: Anti-infectives (From admission, onward)   Start     Dose/Rate Route Frequency Ordered Stop   07/08/20 1000  azithromycin (ZITHROMAX) tablet 500 mg        500 mg Oral Daily 07/08/20 0404     07/07/20 2245  doxycycline (VIBRA-TABS) tablet 100 mg        100 mg Oral  Once 07/07/20 2236       Objective: Vitals: Today's Vitals   07/10/20 0416 07/10/20 0650 07/10/20 0717 07/10/20 0829  BP:      Pulse:      Resp:      Temp:      TempSrc:      SpO2:  97%    Weight: 107.3 kg     Height:      PainSc:   Asleep 3    No intake or output data in the 24 hours ending 07/10/20 0838 Filed Weights   07/09/20 0500 07/10/20 0416  Weight: 108.6 kg 107.3 kg   Weight change: -1.3 kg  Intake/Output from previous day: No intake/output data recorded. Intake/Output this shift: No intake/output data recorded.  Examination: General exam: AAOx3 , NAD,  weak appearing. HEENT:Oral mucosa moist, Ear/Nose WNL grossly, dentition normal. Respiratory system: bilaterally diminished, with prolonged expiration and exp wheezing,no wheezing or crackles,no use of accessory muscle Cardiovascular system: S1 & S2 +, No JVD,. Gastrointestinal system: Abdomen soft, NT,ND, BS+ Nervous System:Alert, awake, moving extremities and grossly nonfocal Extremities: No edema, distal peripheral pulses palpable.  Skin: No rashes,no icterus. MSK: Normal muscle bulk,tone, power  Data Reviewed: I have personally reviewed following labs and imaging studies CBC: Recent Labs  Lab 07/07/20 1847 07/07/20 1942 07/08/20 0430  WBC  --  8.5 6.5  NEUTROABS  --  4.9 5.4  HGB 13.9 13.2 12.2  HCT 41.0 43.1 38.9  MCV  --  96.0 95.3  PLT  --  268 182   Basic Metabolic Panel: Recent Labs  Lab 07/07/20 1835 07/07/20 1847 07/08/20 0405  NA 140 139 140  K 5.2* 5.9* 4.5  CL 101  --  102  CO2 26  --  28  GLUCOSE 88  --  173*  BUN 11  --  10  CREATININE 1.04*  --  0.99  CALCIUM 9.4  --  9.1  MG  --   --  2.3   GFR: Estimated Creatinine Clearance: 62.3 mL/min (by C-G formula based on SCr of 0.99 mg/dL). Liver Function Tests: Recent Labs  Lab 07/08/20 0405  AST 29  ALT 22  ALKPHOS 53  BILITOT 0.6  PROT 7.6  ALBUMIN 3.4*   No results for input(s): LIPASE, AMYLASE in the last 168 hours. No results for input(s): AMMONIA in the last 168 hours. Coagulation Profile: No results for input(s): INR, PROTIME in the last 168 hours. Cardiac Enzymes: No results for input(s): CKTOTAL, CKMB, CKMBINDEX, TROPONINI in the last 168 hours. BNP (last 3 results) No results for input(s): PROBNP in the last 8760 hours. HbA1C: No results for input(s): HGBA1C in the last 72 hours. CBG: No results for input(s): GLUCAP in the last 168 hours. Lipid Profile: No results for input(s): CHOL, HDL, LDLCALC, TRIG, CHOLHDL, LDLDIRECT in the last 72 hours. Thyroid Function Tests: No results  for input(s): TSH, T4TOTAL, FREET4, T3FREE, THYROIDAB in the last 72 hours. Anemia Panel: No results for input(s): VITAMINB12, FOLATE, FERRITIN, TIBC, IRON, RETICCTPCT in the last 72 hours. Sepsis Labs: No results for input(s): PROCALCITON, LATICACIDVEN in the last 168 hours.  Recent Results (from the past 240 hour(s))  Respiratory Panel by RT PCR (Flu A&B, Covid) - Nasopharyngeal Swab     Status: None   Collection Time: 07/07/20  6:01 PM   Specimen: Nasopharyngeal Swab  Result Value Ref Range Status   SARS Coronavirus 2 by RT PCR NEGATIVE NEGATIVE Final    Comment: (NOTE) SARS-CoV-2 target nucleic acids are NOT DETECTED.  The SARS-CoV-2 RNA is generally detectable in upper respiratoy specimens during the acute phase of infection. The lowest concentration of SARS-CoV-2 viral copies this assay can detect is 131 copies/mL. A negative result does not preclude SARS-Cov-2 infection and should not be used as the sole basis for treatment or other patient management decisions. A negative result may occur with  improper specimen collection/handling, submission of specimen other than nasopharyngeal swab, presence of viral mutation(s) within the areas targeted by this assay, and inadequate number of viral copies (<131 copies/mL). A negative result must be combined with clinical observations, patient history, and epidemiological information. The expected result is Negative.  Fact Sheet for Patients:  https://www.moore.com/  Fact Sheet for Healthcare Providers:  https://www.young.biz/  This test is no t yet approved or cleared by the [place] FDA and  has been authorized for detection and/or diagnosis of SARS-CoV-2 by FDA under an Emergency Use Authorization (EUA). This EUA will remain  in effect (meaning this test can be used) for the duration of the COVID-19 declaration under Section 564(b)(1) of the Act, 21 U.S.C. section 360bbb-3(b)(1), unless  the authorization is terminated or revoked sooner.     Influenza A by PCR NEGATIVE NEGATIVE Final   Influenza B by PCR NEGATIVE NEGATIVE Final    Comment: (NOTE) The Xpert Xpress SARS-CoV-2/FLU/RSV assay is intended as an aid in  the diagnosis of influenza from Nasopharyngeal swab specimens and  should not be used as a sole basis for treatment. Nasal washings and  aspirates are unacceptable for Xpert Xpress SARS-CoV-2/FLU/RSV  testing.  Fact Sheet for Patients: https://www.moore.com/  Fact Sheet for Healthcare Providers: https://www.young.biz/  This test is not yet approved or cleared by the [place] FDA and  has been authorized for detection and/or diagnosis of SARS-CoV-2 by  FDA under an Emergency Use Authorization (EUA). This EUA will remain  in  effect (meaning this test can be used) for the duration of the  Covid-19 declaration under Section 564(b)(1) of the Act, 21  U.S.C. section 360bbb-3(b)(1), unless the authorization is  terminated or revoked. Performed at [facility], [address] [place], [place] [address]      Radiology Studies: CT ANGIO CHEST PE W OR WO CONTRAST  Result Date: 07/08/2020 CLINICAL DATA:  Chest pain.  Shortness of breath. EXAM: CT ANGIOGRAPHY CHEST WITH CONTRAST TECHNIQUE: Multidetector CT imaging of the chest was performed using the standard protocol during bolus administration of intravenous contrast. Multiplanar CT image reconstructions and MIPs were obtained to evaluate the vascular anatomy. CONTRAST:  46mL OMNIPAQUE IOHEXOL 350 MG/ML SOLN COMPARISON:  July 19, 2019. FINDINGS: Cardiovascular: Evaluation is limited by patient body habitus. Given this limitation, there was no definite acute pulmonary embolism. There is suboptimal opacification of the segmental and subsegmental branches. This finding is bilateral and symmetric and favored to be secondary to mixing artifact or contrast timing. The  main pulmonary artery is significantly dilated measuring up to approximately 3.8 cm. There is aneurysmal dilatation of the ascending aorta measuring approximately 4.2 cm. There is no evidence for thoracic aortic dissection. The heart size is enlarged. Mediastinum/Nodes: --mediastinal adenopathy is again noted. This is relatively similar to prior study. --extensive hilar adenopathy is again noted, grossly similar to prior study. -- No axillary lymphadenopathy. -- No supraclavicular lymphadenopathy. -- Normal thyroid gland where visualized. -  Unremarkable esophagus. Lungs/Pleura: Again noted are extensive areas of bronchiectasis bilaterally superimposed on a background of ground-glass opacification throughout both lung fields. There is no pneumothorax. No definite focal infiltrate or large pleural effusion. There is atelectasis at the lung bases. The trachea is unremarkable. Upper Abdomen: Contrast bolus timing is not optimized for evaluation of the abdominal organs. The visualized portions of the organs of the upper abdomen are normal. Musculoskeletal: No chest wall abnormality. No bony spinal canal stenosis. Review of the MIP images confirms the above findings. IMPRESSION: 1. Evaluation is limited by patient body habitus. Given this limitation, there was no definite acute pulmonary embolism. 2. Again noted are persistent findings of the patient's reported history of sarcoidosis as detailed above. 3. Cardiomegaly. 4. Dilated main pulmonary artery which can be seen in patients with elevated pulmonary artery pressures. 5. Stable aneurysmal dilatation of the ascending aorta measuring up to 4.2 cm. Recommend annual imaging followup by CTA or MRA. This recommendation follows 2010 ACCF/AHA/AATS/ACR/ASA/SCA/SCAI/SIR/STS/SVM Guidelines for the Diagnosis and Management of Patients with Thoracic Aortic Disease. Circulation. 2010; 121: Y403-K742. Aortic aneurysm NOS (ICD10-I71.9) 6. Persistent extensive areas of bronchiectasis  bilaterally superimposed on a background of ground-glass opacification throughout both lung fields. Findings are consistent with the patient's history of sarcoidosis. Aortic Atherosclerosis (ICD10-I70.0). Electronically Signed   By: Katherine Mantle M.D.   On: 07/08/2020 16:45     LOS: 2 days   Lanae Boast, MD [facility]  07/10/2020, 8:38 AM

## 2020-07-11 ENCOUNTER — Telehealth: Payer: Self-pay | Admitting: Pulmonary Disease

## 2020-07-11 DIAGNOSIS — J9611 Chronic respiratory failure with hypoxia: Secondary | ICD-10-CM

## 2020-07-11 MED ORDER — OXYMETAZOLINE HCL 0.05 % NA SOLN
1.0000 | Freq: Two times a day (BID) | NASAL | Status: DC
  Administered 2020-07-11 – 2020-07-12 (×3): 1 via NASAL
  Filled 2020-07-11: qty 30

## 2020-07-11 MED ORDER — ALUM & MAG HYDROXIDE-SIMETH 200-200-20 MG/5ML PO SUSP: 30.0000 mL | Freq: Four times a day (QID) | ORAL | Status: DC | PRN

## 2020-07-11 NOTE — Progress Notes (Signed)
PROGRESS NOTE  [name]  ZOX:[id number] DOB: Apr 21, 1951 DOA: 07/07/2020 PCP: Pearline [place], Jessica C, MD   Chief Complaint  Patient presents with  . Shortness of Breath   Brief Narrative: [age] year old female with history of pulmonary sarcoidosis, pneumothorax, IBS, GERD, asthma, chronic hypoxic respiratory failure using 2 to 8 L oxygen at home depending upon her activity/level of exertion presented to ED 10/31st with progressively worsening shortness of breath, dyspnea on exertion, low-grade fever 99-100, hypoxia with activity. Seen in the ED, negative for flu and Covid, chest x-ray with severe diffuse interstitial prominence compatible with chronic interstitial lung disease, patient was admitted, seen by pulmonary CT angio chest repeated, was limited but no PE, showed persistent extensive areas of bronchiectasis bilaterally superimposed on background of groundglass opacification throughout both lungs consistent with patient's history of sarcoidosis.  Subjective: Intermittent epistaxis-not new ambulated to bathroom yesterday and 02 dropped, was tired. Would like to get up to shower and work w/ pt today. On 4 L [place]  Assessment & Plan:  Acute on chronic hypoxic aspiratory failure/acute asthma exacerbation in the setting of stage IV sarcoidosis: on 2-8 L [place] at home based on level of activity: Suspecting acute respiratory infection in the setting of stage IV sarcoidosis and asthma exacerbation. CT angio reviewed no new acute finding.  Appreciate pulmonary input: Complete 5 days of azithromycin, continue prednisone 40 mg daily and taper by decreasing 10 mg every 3 days, continue Incruse, Breo, as needed albuterol and keep SPO2 90 to 95% and continue nasal cannula.   Elevated pulmonary hypertension/dilated pulmonary artery on CTA.  Continue supplemental oxygen as above.  Ascending aortic aneurysm 4.2 cm, stable.blood pressure is controlled, will need annual follow-up CT or MRI.  Stage IV  sarcoidosis: Continue steroids, continue home inhalers.  Follow-up with pulmonary as outpatient.   Anxiety : Mood is stable.  GERD: Continue PPI  IBS-continue supportive measures  Lumbar radiculopathy-stable Signed  Sinus congestion/ mild epistaxis/likely from oxygen use/drybnes, not new, intermittent. Add Afrin nasal spray and ENT follow up.  COVID vaccination booster received.  Flu vaccination prior to discharge.   Nutrition: Diet Order            Diet Heart Room service appropriate? Yes; Fluid consistency: Thin  Diet effective now                 Body mass index is 40.6 kg/m. DVT prophylaxis: heparin injection 5,000 Units Start: 07/08/20 0600 SCDs Start: 07/08/20 0405 Code Status:   Code Status: Full Code  Family Communication: plan of care discussed with patient at bedside.  Status is: Inpatient Remains inpatient appropriate because:IV treatments appropriate due to intensity of illness or inability to take PO and Inpatient level of care appropriate due to severity of illness  Dispo: The patient is from: Home              Anticipated d/c is to: Home with home health              Anticipated d/c date is: 1 days, if able to ambulate well today and if stable by tomorrow              Patient currently is not medically stable to d/c.  Consultants:see note  Procedures: CTA 10/31 1. Evaluation is limited by patient body habitus. Given this limitation, there was no definite acute pulmonary embolism. 2. Again noted are persistent findings of the patient's reported history of sarcoidosis as detailed above. 3. Cardiomegaly. 4. Dilated main pulmonary artery  which can be seen in patients with elevated pulmonary artery pressures. 5. Stable aneurysmal dilatation of the ascending aorta measuring up to 4.2 cm. Recommend annual imaging followup by CTA or MRA. This recommendation follows 2010 ACCF/AHA/AATS/ACR/ASA/SCA/SCAI/SIR/STS/SVM Guidelines for the Diagnosis and Management  of Patients with Thoracic Aortic Disease. Circulation. 2010; 121: W409-B353. Aortic aneurysm NOS (ICD10-I71.9) 6. Persistent extensive areas of bronchiectasis bilaterally superimposed on a background of ground-glass opacification throughout both lung fields. Findings are consistent with the patient's history of sarcoidosis  Culture/Microbiology    Component Value Date/Time   SDES BLOOD LEFT HAND 05/05/2017 2339   SPECREQUEST IN PEDIATRIC BOTTLE Blood Culture adequate volume 05/05/2017 2339   CULT NO GROWTH 5 DAYS 05/05/2017 2339   REPTSTATUS 05/11/2017 FINAL 05/05/2017 2339    Other culture-see note  Medications: Scheduled Meds: . azithromycin  500 mg Oral Daily  . cycloSPORINE  1 drop Both Eyes BID  . dextromethorphan  30 mg Oral BID  . doxycycline  100 mg Oral Once  . fluticasone furoate-vilanterol  1 puff Inhalation Daily  . furosemide  20 mg Oral Daily  . heparin  5,000 Units Subcutaneous Q8H  . influenza vaccine adjuvanted  0.5 mL Intramuscular Tomorrow-1000  . loratadine  10 mg Oral QPM  . multivitamin with minerals  1 tablet Oral Daily  . oxymetazoline  1 spray Each Nare BID  . pantoprazole  40 mg Oral BID  . predniSONE  40 mg Oral Q breakfast  . umeclidinium bromide  1 puff Inhalation Daily   Continuous Infusions:  Antimicrobials: Anti-infectives (From admission, onward)   Start     Dose/Rate Route Frequency Ordered Stop   07/08/20 1000  azithromycin (ZITHROMAX) tablet 500 mg        500 mg Oral Daily 07/08/20 0404 07/13/20 0959   07/07/20 2245  doxycycline (VIBRA-TABS) tablet 100 mg        100 mg Oral  Once 07/07/20 2236       Objective: Vitals: Today's Vitals   07/10/20 1229 07/10/20 2150 07/10/20 2206 07/11/20 0606  BP:   131/90 127/80  Pulse:   86 82  Resp:      Temp:   97.7 F (36.5 C) 97.9 F (36.6 C)  TempSrc:      SpO2:   96% 98%  Weight:      Height:      PainSc: 0-No pain 0-No pain     No intake or output data in the 24 hours ending  07/11/20 0902 Filed Weights   07/09/20 0500 07/10/20 0416  Weight: 108.6 kg 107.3 kg   Weight change:   Intake/Output from previous Day: No intake/output data recorded. Intake/Output this shift: No intake/output data recorded.  Examination: General exam: AAOx3 , NAD, weak appearing. HEENT:Oral mucosa moist, Ear/Nose WNL grossly, dentition normal. Respiratory system: bilaterally crackles present with prolonged expiratory phases and mild wheezing on expiration,no use of accessory muscle Cardiovascular system: S1 & S2 +, No JVD,. Gastrointestinal system: Abdomen soft, NT,ND, BS+ Nervous System:Alert, awake, moving extremities and grossly nonfocal Extremities: No edema, distal peripheral pulses palpable.  Skin: No rashes,no icterus. MSK: Normal muscle bulk,tone, power  Data Reviewed: I have personally reviewed following labs and imaging studies CBC: Recent Labs  Lab 07/07/20 1847 07/07/20 1942 07/08/20 0430  WBC  --  8.5 6.5  NEUTROABS  --  4.9 5.4  HGB 13.9 13.2 12.2  HCT 41.0 43.1 38.9  MCV  --  96.0 95.3  PLT  --  268 182   Basic  Metabolic Panel: Recent Labs  Lab 07/07/20 1835 07/07/20 1847 07/08/20 0405  NA 140 139 140  K 5.2* 5.9* 4.5  CL 101  --  102  CO2 26  --  28  GLUCOSE 88  --  173*  BUN 11  --  10  CREATININE 1.04*  --  0.99  CALCIUM 9.4  --  9.1  MG  --   --  2.3   GFR: Estimated Creatinine Clearance: 62.3 mL/min (by C-G formula based on SCr of 0.99 mg/dL). Liver Function Tests: Recent Labs  Lab 07/08/20 0405  AST 29  ALT 22  ALKPHOS 53  BILITOT 0.6  PROT 7.6  ALBUMIN 3.4*   No results for input(s): LIPASE, AMYLASE in the last 168 hours. No results for input(s): AMMONIA in the last 168 hours. Coagulation Profile: No results for input(s): INR, PROTIME in the last 168 hours. Cardiac Enzymes: No results for input(s): CKTOTAL, CKMB, CKMBINDEX, TROPONINI in the last 168 hours. BNP (last 3 results) No results for input(s): PROBNP in the last  8760 hours. HbA1C: No results for input(s): HGBA1C in the last 72 hours. CBG: No results for input(s): GLUCAP in the last 168 hours. Lipid Profile: No results for input(s): CHOL, HDL, LDLCALC, TRIG, CHOLHDL, LDLDIRECT in the last 72 hours. Thyroid Function Tests: No results for input(s): TSH, T4TOTAL, FREET4, T3FREE, THYROIDAB in the last 72 hours. Anemia Panel: No results for input(s): VITAMINB12, FOLATE, FERRITIN, TIBC, IRON, RETICCTPCT in the last 72 hours. Sepsis Labs: No results for input(s): PROCALCITON, LATICACIDVEN in the last 168 hours.  Recent Results (from the past 240 hour(s))  Respiratory Panel by RT PCR (Flu A&B, Covid) - Nasopharyngeal Swab     Status: None   Collection Time: 07/07/20  6:01 PM   Specimen: Nasopharyngeal Swab  Result Value Ref Range Status   SARS Coronavirus 2 by RT PCR NEGATIVE NEGATIVE Final    Comment: (NOTE) SARS-CoV-2 target nucleic acids are NOT DETECTED.  The SARS-CoV-2 RNA is generally detectable in upper respiratoy specimens during the acute phase of infection. The lowest concentration of SARS-CoV-2 viral copies this assay can detect is 131 copies/mL. A negative result does not preclude SARS-Cov-2 infection and should not be used as the sole basis for treatment or other patient management decisions. A negative result may occur with  improper specimen collection/handling, submission of specimen other than nasopharyngeal swab, presence of viral mutation(s) within the areas targeted by this assay, and inadequate number of viral copies (<131 copies/mL). A negative result must be combined with clinical observations, patient history, and epidemiological information. The expected result is Negative.  Fact Sheet for Patients:  https://www.moore.com/  Fact Sheet for Healthcare Providers:  https://www.young.biz/  This test is no t yet approved or cleared by the [place] FDA and  has been authorized for  detection and/or diagnosis of SARS-CoV-2 by FDA under an Emergency Use Authorization (EUA). This EUA will remain  in effect (meaning this test can be used) for the duration of the COVID-19 declaration under Section 564(b)(1) of the Act, 21 U.S.C. section 360bbb-3(b)(1), unless the authorization is terminated or revoked sooner.     Influenza A by PCR NEGATIVE NEGATIVE Final   Influenza B by PCR NEGATIVE NEGATIVE Final    Comment: (NOTE) The Xpert Xpress SARS-CoV-2/FLU/RSV assay is intended as an aid in  the diagnosis of influenza from Nasopharyngeal swab specimens and  should not be used as a sole basis for treatment. Nasal washings and  aspirates are unacceptable for  Xpert Xpress SARS-CoV-2/FLU/RSV  testing.  Fact Sheet for Patients: https://www.moore.com/  Fact Sheet for Healthcare Providers: https://www.young.biz/  This test is not yet approved or cleared by the [place] FDA and  has been authorized for detection and/or diagnosis of SARS-CoV-2 by  FDA under an Emergency Use Authorization (EUA). This EUA will remain  in effect (meaning this test can be used) for the duration of the  Covid-19 declaration under Section 564(b)(1) of the Act, 21  U.S.C. section 360bbb-3(b)(1), unless the authorization is  terminated or revoked. Performed at [facility], [address], [place], [place] [address]      Radiology Studies: No results found.   LOS: 3 days   Lanae Boast, MD [facility]  07/11/2020, 9:02 AM

## 2020-07-11 NOTE — Progress Notes (Signed)
Physical Therapy Treatment Patient Details Name: [name] MRN: [id number] DOB: 09/02/1951 Today's Date: 07/11/2020    History of Present Illness Pt adm with acute on chronic respiratory failure and asthma exacerbation. Covid and flu test negative.  PMH - pulmonary sarcoidosis, pneumothorax, asthma.    PT Comments    Patient received just returning from bathroom independently. She is agreeable to PT session. She reports she is feeling much better compared to when she came in. Continues to desaturate with mobility on 4lpm O2. She ambulated 40 feet then 30 more after seated rest. O2 sats down to 81% with mobility. She demonstrates good awareness of safety and her limitations. Cues needed for pacing of activity and to rest frequently.  Has her own pulse oximeter to monitor O2 saturations. She will continue to benefit from skilled PT while here to improve activity tolerance.    Follow Up Recommendations  Home health PT     Equipment Recommendations  3in1 (PT)    Recommendations for Other Services       Precautions / Restrictions Precautions Precaution Comments: mod fall Restrictions Weight Bearing Restrictions: No    Mobility  Bed Mobility Overal bed mobility: Independent                Transfers Overall transfer level: Modified independent               General transfer comment: Patient able to transfer with mod independence, increased time due to SOB/decreased sats  Ambulation/Gait Ambulation/Gait assistance: Modified independent (Device/Increase time) Gait Distance (Feet): 40 Feet Assistive device: None Gait Pattern/deviations: Decreased stride length Gait velocity: decreased   General Gait Details: patient ambulated on 4 lpm O2, sats dropped to 81% after ambulation. With pursed lip breathing and rest, patient able to recover to 93% within 3 minutes. HR up to 115.   Stairs             Wheelchair Mobility    Modified Rankin (Stroke Patients  Only)       Balance Overall balance assessment: No apparent balance deficits (not formally assessed)                                          Cognition Arousal/Alertness: Awake/alert Behavior During Therapy: WFL for tasks assessed/performed Overall Cognitive Status: Within Functional Limits for tasks assessed                                        Exercises      General Comments        Pertinent Vitals/Pain Pain Assessment: No/denies pain    Home Living                      Prior Function            PT Goals (current goals can now be found in the care plan section) Acute Rehab PT Goals Patient Stated Goal: return to home PT Goal Formulation: With patient Time For Goal Achievement: 07/23/20 Potential to Achieve Goals: Good Additional Goals Additional Goal #1: Pt will perform transfers while consistently maintaining SpO2 above 88% Progress towards PT goals: Progressing toward goals    Frequency    Min 3X/week      PT Plan Current plan remains appropriate  Co-evaluation              AM-PAC PT "6 Clicks" Mobility   Outcome Measure  Help needed turning from your back to your side while in a flat bed without using bedrails?: None Help needed moving from lying on your back to sitting on the side of a flat bed without using bedrails?: None Help needed moving to and from a bed to a chair (including a wheelchair)?: None Help needed standing up from a chair using your arms (e.g., wheelchair or bedside chair)?: None Help needed to walk in hospital room?: A Little Help needed climbing 3-5 steps with a railing? : A Little 6 Click Score: 22    End of Session Equipment Utilized During Treatment: Oxygen Activity Tolerance: Patient tolerated treatment well Patient left: in chair;with call bell/phone within reach Nurse Communication: Mobility status PT Visit Diagnosis: Other abnormalities of gait and mobility  (R26.89)     Time: 1610-9604 PT Time Calculation (min) (ACUTE ONLY): 43 min  Charges:  $Gait Training: 23-37 mins                     [employer], PT, GCS 07/11/20,12:42 PM

## 2020-07-11 NOTE — Plan of Care (Signed)

## 2020-07-12 DIAGNOSIS — J9621 Acute and chronic respiratory failure with hypoxia: Secondary | ICD-10-CM | POA: Diagnosis not present

## 2020-07-12 DIAGNOSIS — D869 Sarcoidosis, unspecified: Secondary | ICD-10-CM | POA: Diagnosis not present

## 2020-07-12 MED ORDER — DM-GUAIFENESIN ER 30-600 MG PO TB12
1.0000 | ORAL_TABLET | Freq: Two times a day (BID) | ORAL | Status: DC
  Administered 2020-07-12: 1 via ORAL
  Filled 2020-07-12: qty 1

## 2020-07-12 MED ORDER — PNEUMOCOCCAL VAC POLYVALENT 25 MCG/0.5ML IJ INJ
0.5000 mL | INJECTION | INTRAMUSCULAR | Status: DC
Start: 2020-07-13 — End: 2020-07-12

## 2020-07-12 MED ORDER — ALBUTEROL SULFATE (2.5 MG/3ML) 0.083% IN NEBU: 2.5000 mg | INHALATION_SOLUTION | RESPIRATORY_TRACT | 2 refills | Status: DC | PRN

## 2020-07-12 MED ORDER — PREDNISONE 10 MG PO TABS: ORAL_TABLET | ORAL | 0 refills | Status: DC

## 2020-07-12 MED ORDER — PNEUMOCOCCAL VAC POLYVALENT 25 MCG/0.5ML IJ INJ
0.5000 mL | INJECTION | Freq: Once | INTRAMUSCULAR | Status: AC
  Administered 2020-07-12: 0.5 mL via INTRAMUSCULAR

## 2020-07-12 MED ORDER — PREDNISONE 5 MG PO TABS: 7.5000 mg | ORAL_TABLET | Freq: Every day | ORAL | 0 refills | Status: AC

## 2020-07-12 NOTE — Discharge Summary (Signed)
Physician Discharge Summary  [name] IRJ:[id number] DOB: 1950-09-18 DOA: 07/07/2020  PCP: Pearline Cables, MD  Admit date: 07/07/2020 Discharge date: 07/12/2020  Admitted From: home Disposition:  home  Recommendations for Outpatient Follow-up:  Follow up with PCP in 1-2 weeks Please obtain BMP/CBC in one week Please follow up on the following pending results:  Home Health: HHPT  Equipment/Devices: 3in1  Discharge Condition: Stable Code Status:   Code Status: Full Code Diet recommendation:  Diet Order             Diet - low sodium heart healthy           Diet Heart Room service appropriate? Yes; Fluid consistency: Thin  Diet effective now                    Brief/Interim Summary: 69 year old female with history of pulmonary sarcoidosis, pneumothorax, IBS, GERD, asthma, chronic hypoxic respiratory failure using 2 to 8 L oxygen at home depending upon her activity/level of exertion presented to ED 10/31st with progressively worsening shortness of breath, dyspnea on exertion, low-grade fever 99-100, hypoxia with activity. Seen in the ED, negative for flu and Covid, chest x-ray with severe diffuse interstitial prominence compatible with chronic interstitial lung disease, patient was admitted, seen by pulmonary CT angio chest repeated, was limited but no PE, showed persistent extensive areas of bronchiectasis bilaterally superimposed on background of groundglass opacification throughout both lungs consistent with patient's history of sarcoidosis. Patient was managed with pulmonary consult, aggressive IV steroids, bronchodilators. Patient slowly improved at this time discharge [place] has significantly improved, saturating well on home oxygen setting. Pulmonary has advised to continue prednisone and taper taper by decreasing 10 mg every 3 days, continue Incruse, Breo. Seen by PT and doing well and planning for home health PT in 3 months  Discharge Diagnoses:  Acute on  chronic hypoxic aspiratory failure/acute asthma exacerbation in the setting of stage IV sarcoidosis: on 2-8 L [place] at home based on level of activity: Suspecting acute respiratory infection in the setting of stage IV sarcoidosis and asthma exacerbation. CT angio no new acute finding besides finding of sarcoidosis.  Seen by pulmonary completed 5 days azithromycin and will go home on prednisone taper-and then continue with baseline 7.5 mg prednisone daily, along with Mucinex, home Incruse, Breo and inhalers-sending prescription for albuterol inhaler. She will follow up with her pulmonary.   Elevated pulmonary hypertension/dilated pulmonary artery on CTA.   Continue home oxygen. Ascending aortic aneurysm 4.2 cm, stable.controlled bp, will need annual follow-up CT or MRI Stage IV sarcoidosis: Continue her home regimen inhalers and tapering steroid Anxiety : Mood is stable. GERD: Continue PPI  IBS-continue supportive measures Lumbar radiculopathy-stable Sinus congestion/ mild epistaxis/likely from oxygen use/drybnes, not new, intermittent.  Continue afrin and follow-up with ENT.   COVID vaccination booster received.  Flu vaccination prior to discharge.  Consults: PCCM  Subjective: Alert awake not dyspneic, resting comfortably.  Wants to go home today. Discharge Exam: Vitals:   07/12/20 0720 07/12/20 0807  BP:    Pulse:  92  Resp:  16  Temp:    SpO2: 93% 98%   General: Pt is alert, awake, not in acute distress Cardiovascular: RRR, S1/S2 +, no rubs, no gallops Respiratory: CTA bilaterally, no wheezing, no rhonchi Abdominal: Soft, NT, ND, bowel sounds + Extremities: no edema, no cyanosis  Discharge Instructions  Discharge Instructions     Diet - low sodium heart healthy   Complete by: As directed  Discharge instructions   Complete by: As directed    you will need CT scan/MRI of the chest in 1 year to monitor your aneurysm  Follow-up with pulmonary  Please call call MD or return to  ER for similar or worsening recurring problem that brought you to hospital or if any fever,nausea/vomiting,abdominal pain, uncontrolled pain, chest pain,  shortness of breath or any other alarming symptoms.  Please follow-up your doctor as instructed in a week time and call the office for appointment.  Please avoid alcohol, smoking, or any other illicit substance and maintain healthy habits including taking your regular medications as prescribed.  You were cared for by a hospitalist during your hospital stay. If you have any questions about your discharge medications or the care you received while you were in the hospital after you are discharged, you can call the unit and ask to speak with the hospitalist on call if the hospitalist that took care of you is not available.  Once you are discharged, your primary care physician will handle any further medical issues. Please note that NO REFILLS for any discharge medications will be authorized once you are discharged, as it is imperative that you return to your primary care physician (or establish a relationship with a primary care physician if you do not have one) for your aftercare needs so that they can reassess your need for medications and monitor your lab values   Increase activity slowly   Complete by: As directed       Allergies as of 07/12/2020       Reactions   Gabapentin Swelling   EDEMA WITH POSITIVE RECHALLENGE   Lactose Intolerance (gi) Diarrhea, Other (See Comments)   Stomach cramps   Pineapple Hives, Itching   Penicillins Other (See Comments)   Unknown - childhood allergy Has patient had a PCN reaction causing immediate rash, facial/tongue/throat swelling, SOB or lightheadedness with hypotension: Unknown Has patient had a PCN reaction causing severe rash involving mucus membranes or skin necrosis: Unknown Has patient had a PCN reaction that required hospitalization: Unknown Has patient had a PCN reaction occurring within the  last 10 years: No If all of the above answers are "NO", then may proceed with Cephalosporin use.   Aspirin Other (See Comments)   gastritis        Medication List     TAKE these medications    albuterol (2.5 MG/3ML) 0.083% nebulizer solution Commonly known as: PROVENTIL Take 3 mLs (2.5 mg total) by nebulization every 4 (four) hours as needed for wheezing or shortness of breath.   ALOE VERA JUICE PO Take 120 mLs by mouth daily.   Artificial Tears 1.4 % ophthalmic solution Generic drug: polyvinyl alcohol Place 1 drop into both eyes See admin instructions. Instill 1 drop in to both eyes once or twice daily as needed for dry eyes - in between restasis doses   ASTRAGALUS PO Take 1 tablet by mouth daily.   CALCIUM-D PO Take 1 tablet by mouth daily.   Collagen Hydrolysate Powd Take 1 scoop by mouth daily with supper.   cycloSPORINE 0.05 % ophthalmic emulsion Commonly known as: RESTASIS Place 1 drop into both eyes 2 (two) times daily.   Delsym 30 MG/5ML liquid Generic drug: dextromethorphan Take 30 mg by mouth at bedtime as needed for cough.   diphenhydrAMINE 25 MG tablet Commonly known as: BENADRYL Take 25 mg by mouth at bedtime.   Flutter Devi 1 application by Does not apply route 2 (two) times  daily.   furosemide 20 MG tablet Commonly known as: LASIX Take 1 tablet (20 mg total) by mouth daily. Use as needed for swelling What changed: additional instructions   ipratropium 17 MCG/ACT inhaler Commonly known as: ATROVENT HFA Inhale 2 puffs into the lungs every 6 (six) hours as needed for wheezing.   loratadine 10 MG tablet Commonly known as: CLARITIN Take 10 mg by mouth daily.   magnesium 30 MG tablet Take 30 mg by mouth daily.   multivitamin with minerals Tabs tablet Take 1 tablet by mouth daily.   OMEGA 3-6-9 COMPLEX PO Take 1 capsule by mouth daily.   OXYGEN Inhale 2-4 L into the lungs continuous. If moving around it is increased   pantoprazole 40 MG  tablet Commonly known as: PROTONIX Take 1 tablet (40 mg total) by mouth 2 (two) times daily.   predniSONE 5 MG tablet Commonly known as: DELTASONE Take 1.5 tablets (7.5 mg total) by mouth daily with breakfast. Hold your home prednisone while you are on steroid taper What changed: additional instructions   predniSONE 10 MG tablet Commonly known as: DELTASONE Take PO 4 tabs dailyx3 days,3 tabs dailyx3 days,2 tabs daily x 3 days,1 tab dailyx3 days then resume home dose of 7.5 mg daily What changed: You were already taking a medication with the same name, and this prescription was added. Make sure you understand how and when to take each.   Rhinase Soln Place 2 sprays into the nose 2 (two) times daily as needed (dry, bloody nostrils).   Trelegy Ellipta 100-62.5-25 MCG/INH Aepb Generic drug: Fluticasone-Umeclidin-Vilant Inhale 1 puff into the lungs daily.   Vitamin B-12 5000 MCG Tbdp Take 5,000 mcg by mouth daily.   VITAMIN C PO Take 1 tablet by mouth daily. 1000 MG        Follow-up Information     Copland, Gwenlyn Found, MD Follow up in 1 week(s).   Specialty: Family Medicine Contact information: [address] [place] [address] [phone]                Allergies  Allergen Reactions   Gabapentin Swelling    EDEMA WITH POSITIVE RECHALLENGE   Lactose Intolerance (Gi) Diarrhea and Other (See Comments)    Stomach cramps   Pineapple Hives and Itching   Penicillins Other (See Comments)    Unknown - childhood allergy Has patient had a PCN reaction causing immediate rash, facial/tongue/throat swelling, SOB or lightheadedness with hypotension: Unknown Has patient had a PCN reaction causing severe rash involving mucus membranes or skin necrosis: Unknown Has patient had a PCN reaction that required hospitalization: Unknown Has patient had a PCN reaction occurring within the last 10 years: No If all of the above answers are "NO", then may proceed with  Cephalosporin use.   Aspirin Other (See Comments)    gastritis    The results of significant diagnostics from this hospitalization (including imaging, microbiology, ancillary and laboratory) are listed below for reference.    Microbiology: Recent Results (from the past 240 hour(s))  Respiratory Panel by RT PCR (Flu A&B, Covid) - Nasopharyngeal Swab     Status: None   Collection Time: 07/07/20  6:01 PM   Specimen: Nasopharyngeal Swab  Result Value Ref Range Status   SARS Coronavirus 2 by RT PCR NEGATIVE NEGATIVE Final    Comment: (NOTE) SARS-CoV-2 target nucleic acids are NOT DETECTED.  The SARS-CoV-2 RNA is generally detectable in upper respiratoy specimens during the acute phase of infection.  The lowest concentration of SARS-CoV-2 viral copies this assay can detect is 131 copies/mL. A negative result does not preclude SARS-Cov-2 infection and should not be used as the sole basis for treatment or other patient management decisions. A negative result may occur with  improper specimen collection/handling, submission of specimen other than nasopharyngeal swab, presence of viral mutation(s) within the areas targeted by this assay, and inadequate number of viral copies (<131 copies/mL). A negative result must be combined with clinical observations, patient history, and epidemiological information. The expected result is Negative.  Fact Sheet for Patients:  https://www.moore.com/https://www.fda.gov/media/142436/download  Fact Sheet for Healthcare Providers:  https://www.young.biz/https://www.fda.gov/media/142435/download  This test is no t yet approved or cleared by the [place] FDA and  has been authorized for detection and/or diagnosis of SARS-CoV-2 by FDA under an Emergency Use Authorization (EUA). This EUA will remain  in effect (meaning this test can be used) for the duration of the COVID-19 declaration under Section 564(b)(1) of the Act, 21 U.S.C. section 360bbb-3(b)(1), unless the authorization is terminated  or revoked sooner.     Influenza A by PCR NEGATIVE NEGATIVE Final   Influenza B by PCR NEGATIVE NEGATIVE Final    Comment: (NOTE) The Xpert Xpress SARS-CoV-2/FLU/RSV assay is intended as an aid in  the diagnosis of influenza from Nasopharyngeal swab specimens and  should not be used as a sole basis for treatment. Nasal washings and  aspirates are unacceptable for Xpert Xpress SARS-CoV-2/FLU/RSV  testing.  Fact Sheet for Patients: https://www.moore.com/https://www.fda.gov/media/142436/download  Fact Sheet for Healthcare Providers: https://www.young.biz/https://www.fda.gov/media/142435/download  This test is not yet approved or cleared by the [place] FDA and  has been authorized for detection and/or diagnosis of SARS-CoV-2 by  FDA under an Emergency Use Authorization (EUA). This EUA will remain  in effect (meaning this test can be used) for the duration of the  Covid-19 declaration under Section 564(b)(1) of the Act, 21  U.S.C. section 360bbb-3(b)(1), unless the authorization is  terminated or revoked. Performed at [facility], [address] [place], [place] [address]     Procedures/Studies: DG Chest 2 View  Result Date: 07/07/2020 CLINICAL DATA:  Shortness of breath EXAM: CHEST - 2 VIEW COMPARISON:  02/01/2019 FINDINGS: Heart is normal size. Diffuse interstitial prominence throughout the lungs, stable since prior study compatible with chronic interstitial lung disease. No definite acute process. No effusions or acute bony abnormality. IMPRESSION: Severe diffuse interstitial prominence compatible chronic interstitial lung disease. Electronically Signed   By: Charlett NoseKevin  Dover M.D.   On: 07/07/2020 16:35   CT ANGIO CHEST PE W OR WO CONTRAST  Result Date: 07/08/2020 CLINICAL DATA:  Chest pain.  Shortness of breath. EXAM: CT ANGIOGRAPHY CHEST WITH CONTRAST TECHNIQUE: Multidetector CT imaging of the chest was performed using the standard protocol during bolus administration of intravenous contrast. Multiplanar CT  image reconstructions and MIPs were obtained to evaluate the vascular anatomy. CONTRAST:  80mL OMNIPAQUE IOHEXOL 350 MG/ML SOLN COMPARISON:  July 19, 2019. FINDINGS: Cardiovascular: Evaluation is limited by patient body habitus. Given this limitation, there was no definite acute pulmonary embolism. There is suboptimal opacification of the segmental and subsegmental branches. This finding is bilateral and symmetric and favored to be secondary to mixing artifact or contrast timing. The main pulmonary artery is significantly dilated measuring up to approximately 3.8 cm. There is aneurysmal dilatation of the ascending aorta measuring approximately 4.2 cm. There is no evidence for thoracic aortic dissection. The heart size is enlarged. Mediastinum/Nodes: --mediastinal adenopathy is again noted. This is  relatively similar to prior study. --extensive hilar adenopathy is again noted, grossly similar to prior study. -- No axillary lymphadenopathy. -- No supraclavicular lymphadenopathy. -- Normal thyroid gland where visualized. -  Unremarkable esophagus. Lungs/Pleura: Again noted are extensive areas of bronchiectasis bilaterally superimposed on a background of ground-glass opacification throughout both lung fields. There is no pneumothorax. No definite focal infiltrate or large pleural effusion. There is atelectasis at the lung bases. The trachea is unremarkable. Upper Abdomen: Contrast bolus timing is not optimized for evaluation of the abdominal organs. The visualized portions of the organs of the upper abdomen are normal. Musculoskeletal: No chest wall abnormality. No bony spinal canal stenosis. Review of the MIP images confirms the above findings. IMPRESSION: 1. Evaluation is limited by patient body habitus. Given this limitation, there was no definite acute pulmonary embolism. 2. Again noted are persistent findings of the patient's reported history of sarcoidosis as detailed above. 3. Cardiomegaly. 4. Dilated main  pulmonary artery which can be seen in patients with elevated pulmonary artery pressures. 5. Stable aneurysmal dilatation of the ascending aorta measuring up to 4.2 cm. Recommend annual imaging followup by CTA or MRA. This recommendation follows 2010 ACCF/AHA/AATS/ACR/ASA/SCA/SCAI/SIR/STS/SVM Guidelines for the Diagnosis and Management of Patients with Thoracic Aortic Disease. Circulation. 2010; 121: Z610-R604. Aortic aneurysm NOS (ICD10-I71.9) 6. Persistent extensive areas of bronchiectasis bilaterally superimposed on a background of ground-glass opacification throughout both lung fields. Findings are consistent with the patient's history of sarcoidosis. Aortic Atherosclerosis (ICD10-I70.0). Electronically Signed   By: Katherine Mantle M.D.   On: 07/08/2020 16:45   Portable chest 1 View  Result Date: 07/08/2020 CLINICAL DATA:  Acute on chronic respiratory failure. EXAM: PORTABLE CHEST 1 VIEW COMPARISON:  07/07/2020 and older exams. FINDINGS: Cardiac silhouette is normal in size. No mediastinal masses. Enlarged hila consistent with adenopathy as noted on previous chest CTs. Extensive irregular interstitial thickening, unchanged from the prior exam, consistent with fibrosis. No lung consolidation to suggest pneumonia. No pleural effusion or pneumothorax. IMPRESSION: 1. No change from the previous day's study. 2. Extensive changes of sarcoidosis and interstitial lung disease. Electronically Signed   By: Amie Portland M.D.   On: 07/08/2020 06:06    Labs: BNP (last 3 results) Recent Labs    07/07/20 1942  BNP 28.7   Basic Metabolic Panel: Recent Labs  Lab 07/07/20 1835 07/07/20 1847 07/08/20 0405  NA 140 139 140  K 5.2* 5.9* 4.5  CL 101  --  102  CO2 26  --  28  GLUCOSE 88  --  173*  BUN 11  --  10  CREATININE 1.04*  --  0.99  CALCIUM 9.4  --  9.1  MG  --   --  2.3   Liver Function Tests: Recent Labs  Lab 07/08/20 0405  AST 29  ALT 22  ALKPHOS 53  BILITOT 0.6  PROT 7.6  ALBUMIN  3.4*   No results for input(s): LIPASE, AMYLASE in the last 168 hours. No results for input(s): AMMONIA in the last 168 hours. CBC: Recent Labs  Lab 07/07/20 1847 07/07/20 1942 07/08/20 0430  WBC  --  8.5 6.5  NEUTROABS  --  4.9 5.4  HGB 13.9 13.2 12.2  HCT 41.0 43.1 38.9  MCV  --  96.0 95.3  PLT  --  268 182   Cardiac Enzymes: No results for input(s): CKTOTAL, CKMB, CKMBINDEX, TROPONINI in the last 168 hours. BNP: Invalid input(s): POCBNP CBG: No results for input(s): GLUCAP in the last 168 hours. D-Dimer  No results for input(s): DDIMER in the last 72 hours. Hgb A1c No results for input(s): HGBA1C in the last 72 hours. Lipid Profile No results for input(s): CHOL, HDL, LDLCALC, TRIG, CHOLHDL, LDLDIRECT in the last 72 hours. Thyroid function studies No results for input(s): TSH, T4TOTAL, T3FREE, THYROIDAB in the last 72 hours.  Invalid input(s): FREET3 Anemia work up No results for input(s): VITAMINB12, FOLATE, FERRITIN, TIBC, IRON, RETICCTPCT in the last 72 hours. Urinalysis    Component Value Date/Time   COLORURINE AMBER (A) 05/10/2017 1600   APPEARANCEUR HAZY (A) 05/10/2017 1600   LABSPEC 1.029 05/10/2017 1600   PHURINE 5.0 05/10/2017 1600   GLUCOSEU NEGATIVE 05/10/2017 1600   HGBUR SMALL (A) 05/10/2017 1600   BILIRUBINUR SMALL (A) 05/10/2017 1600   BILIRUBINUR Negaitve 03/20/2016 1158   KETONESUR NEGATIVE 05/10/2017 1600   PROTEINUR 100 (A) 05/10/2017 1600   UROBILINOGEN negative 03/20/2016 1158   NITRITE NEGATIVE 05/10/2017 1600   LEUKOCYTESUR NEGATIVE 05/10/2017 1600   Sepsis Labs Invalid input(s): PROCALCITONIN,  WBC,  LACTICIDVEN Microbiology Recent Results (from the past 240 hour(s))  Respiratory Panel by RT PCR (Flu A&B, Covid) - Nasopharyngeal Swab     Status: None   Collection Time: 07/07/20  6:01 PM   Specimen: Nasopharyngeal Swab  Result Value Ref Range Status   SARS Coronavirus 2 by RT PCR NEGATIVE NEGATIVE Final    Comment:  (NOTE) SARS-CoV-2 target nucleic acids are NOT DETECTED.  The SARS-CoV-2 RNA is generally detectable in upper respiratoy specimens during the acute phase of infection. The lowest concentration of SARS-CoV-2 viral copies this assay can detect is 131 copies/mL. A negative result does not preclude SARS-Cov-2 infection and should not be used as the sole basis for treatment or other patient management decisions. A negative result may occur with  improper specimen collection/handling, submission of specimen other than nasopharyngeal swab, presence of viral mutation(s) within the areas targeted by this assay, and inadequate number of viral copies (<131 copies/mL). A negative result must be combined with clinical observations, patient history, and epidemiological information. The expected result is Negative.  Fact Sheet for Patients:  https://www.moore.com/  Fact Sheet for Healthcare Providers:  https://www.young.biz/  This test is no t yet approved or cleared by the [place] FDA and  has been authorized for detection and/or diagnosis of SARS-CoV-2 by FDA under an Emergency Use Authorization (EUA). This EUA will remain  in effect (meaning this test can be used) for the duration of the COVID-19 declaration under Section 564(b)(1) of the Act, 21 U.S.C. section 360bbb-3(b)(1), unless the authorization is terminated or revoked sooner.     Influenza A by PCR NEGATIVE NEGATIVE Final   Influenza B by PCR NEGATIVE NEGATIVE Final    Comment: (NOTE) The Xpert Xpress SARS-CoV-2/FLU/RSV assay is intended as an aid in  the diagnosis of influenza from Nasopharyngeal swab specimens and  should not be used as a sole basis for treatment. Nasal washings and  aspirates are unacceptable for Xpert Xpress SARS-CoV-2/FLU/RSV  testing.  Fact Sheet for Patients: https://www.moore.com/  Fact Sheet for Healthcare  Providers: https://www.young.biz/  This test is not yet approved or cleared by the [place] FDA and  has been authorized for detection and/or diagnosis of SARS-CoV-2 by  FDA under an Emergency Use Authorization (EUA). This EUA will remain  in effect (meaning this test can be used) for the duration of the  Covid-19 declaration under Section 564(b)(1) of the Act, 21  U.S.C. section 360bbb-3(b)(1), unless the authorization is  terminated  or revoked. Performed at [facility], [address], [place], [place] [address]      Time coordinating discharge: 25  minutes  SIGNED: Lanae Boast, MD  [facility] 07/12/2020, 9:38 AM  If 7PM-7AM, please contact night-coverage [email or web address]

## 2020-07-12 NOTE — Progress Notes (Addendum)
NAME:  [name], MRN:  [id number], DOB:  1951/04/26, LOS: 4 ADMISSION DATE:  07/07/2020, CONSULTATION DATE:  07/08/2020 REFERRING MD:  Dr. Lyn Hollingshead, [facility], CHIEF COMPLAINT:  Short of breath   Brief History   69 yo female former smoker presented with dyspnea, low grade fever, worsening hypoxia, chest discomfort.  Followed in pulmonary office for Stage 4 pulmonary sarcoidosis on chronic prednisone, asthma, and chronic hypoxic respiratory failure on 3 to 5 liters supplemental oxygen.  History of present illness   She is followed in pulmonary office for sarcoid and asthma.  She is on 7.5 mg prednisone daily and 3 to 5 liters oxygen.  She started getting more short of breath and a cough over past several days.  Not bringing up sputum but felt congested.  Has some sore throat.  Needed more supplemental oxygen to maintain SpO2.  Had low grade temp of 99 to 100F.  Start on solumedrol, nebulizer treatment and antibiotics.  Has some improvement.  Still has chest discomfort and tachycardia.  Past Medical History  Anemia, Anxiety, Barrett's Esophagus, Depression, HH, IBS, PTX  Significant Hospital Events   10/30 Admit  Consults:    Procedures:    Significant Diagnostic Tests:   PFT 08/14/16 >> 1.60 (88%), FEV1% 87, TLC 3.34 (68%), DLCO 35%, +BD  CT chest 09/20/17 >> subpleural reticulation, distortion, honeycombing b/l  HRCT chest 07/20/19 >> atherosclerosis, borderline LAN, extensive cylindrical and varicose BTX, severe peribronchovascular thickening, extensive asymmetric honeycombing  Echo 08/14/16 >> EF 60 to 65%, grade 1 DD, PAS 30 mmHg  CT angio chest 07/08/20 >> No acute pulmonary emboli. Chronic varicose bronchiectasis, honeycombing, bilateral GGO   Micro Data:  COVID/Influenza 10/30 >> negative  Antimicrobials:  Zithromax 10/30 >>  Doxycycline 10/30 >>   Interim history/subjective:  Started on prednisone yesterday. Weaned to 3L. Feels overall better. Still has chest  congestion.  Objective   Blood pressure 109/73, pulse 92, temperature 97.8 F (36.6 C), temperature source Oral, resp. rate 17, height 5\' 4"  (1.626 m), weight 107.3 kg, SpO2 93 %.        Intake/Output Summary (Last 24 hours) at 07/12/2020 0744 Last data filed at 07/11/2020 1000 Gross per 24 hour  Intake 360 ml  Output --  Net 360 ml   Filed Weights   07/09/20 0500 07/10/20 0416  Weight: 108.6 kg 107.3 kg   Physical Exam: General: Well-appearing, no acute distress HENT: [place], AT, OP clear, MMM Eyes: EOMI, no scleral icterus Respiratory: Diminished breath sounds bilaterally.  Bibasilar fine inspiratory crackles. No wheeze Cardiovascular: RRR, -M/R/G, no JVD GI: BS+, soft, nontender Extremities:-Edema,-tenderness Neuro: AAO x4, CNII-XII grossly intact Skin: Intact, no rashes or bruising Psych: Normal mood, normal affect   Resolved Hospital Problem list     Assessment & Plan:   Acute on chronic hypoxic respiratory failure - On 3L at rest and 6-8L with exertion at home Fibrotic sarcoid - Likely from acute respiratory infection in setting of stage 4 sarcoidosis and asthma exacerbation. Underlying sarcoid on CT overall stable - Currently on prednisone 40 mg daily. Please send home on taper (decrease by 10 mg every 3 days until she is on her baseline 7.5 mg daily) - Start mucinex - Complete five days of azithromycin - Continue incruse, breo  - Patient requests for albuterol nebulizer at discharge. Already has nebulizer, need RX - goal SpO2 90 to 95%. Will need ambulatory O2 prior to discharge to determine home oxygen needs - OOB and ambulation as tolerated  Sinus  tachycardia - CTA negative pulmonary emboli  - Telemetry  Pulmonary will sign off. Follow-up has been arranged for Pulmonary clinic. Please call for any questions or concerns.  Best practice:  Diet: heart healthy DVT prophylaxis: SQ heparin GI prophylaxis: protonix Mobility: OOB to chair Code Status: full  code  Labs   CBC: Recent Labs  Lab 07/07/20 1847 07/07/20 1942 07/08/20 0430  WBC  --  8.5 6.5  NEUTROABS  --  4.9 5.4  HGB 13.9 13.2 12.2  HCT 41.0 43.1 38.9  MCV  --  96.0 95.3  PLT  --  268 182    Basic Metabolic Panel: Recent Labs  Lab 07/07/20 1835 07/07/20 1847 07/08/20 0405  NA 140 139 140  K 5.2* 5.9* 4.5  CL 101  --  102  CO2 26  --  28  GLUCOSE 88  --  173*  BUN 11  --  10  CREATININE 1.04*  --  0.99  CALCIUM 9.4  --  9.1  MG  --   --  2.3   GFR: Estimated Creatinine Clearance: 62.3 mL/min (by C-G formula based on SCr of 0.99 mg/dL). Recent Labs  Lab 07/07/20 1942 07/08/20 0430  WBC 8.5 6.5    Liver Function Tests: Recent Labs  Lab 07/08/20 0405  AST 29  ALT 22  ALKPHOS 53  BILITOT 0.6  PROT 7.6  ALBUMIN 3.4*   No results for input(s): LIPASE, AMYLASE in the last 168 hours. No results for input(s): AMMONIA in the last 168 hours.  ABG    Component Value Date/Time   HCO3 33.3 (H) 07/07/2020 1847   TCO2 35 (H) 07/07/2020 1847   O2SAT 92.0 07/07/2020 1847     Coagulation Profile: No results for input(s): INR, PROTIME in the last 168 hours.  Cardiac Enzymes: No results for input(s): CKTOTAL, CKMB, CKMBINDEX, TROPONINI in the last 168 hours.  HbA1C: Hgb A1c MFr Bld  Date/Time Value Ref Range Status  09/27/2018 11:49 AM 5.5 4.6 - 6.5 % Final    Comment:    Glycemic Control Guidelines for People with Diabetes:Non Diabetic:  <6%Goal of Therapy: <7%Additional Action Suggested:  >8%     CBG: No results for input(s): GLUCAP in the last 168 hours.   Signature:  Care Time: 15 min  Mechele Collin, M.D. [facility] Pulmonary/Critical Care Medicine 07/12/2020 7:44 AM

## 2020-07-12 NOTE — Telephone Encounter (Signed)
Order was sent to [facility] for nasal cannulas  Pt aware and nothing further needed

## 2020-07-12 NOTE — Care Management Important Message (Signed)
Important Message  Patient Details  Name: [name] MRN: [id number] Date of Birth: 13-Nov-1950   Medicare Important Message Given:      Patient left prior to IM delivery document mailed to the patient home address.    Shatika Grinnell 07/12/2020, 3:45 PM

## 2020-07-12 NOTE — Progress Notes (Signed)
Received patient for care around 1900 from off-going nurse. MAR, chart, and labs reviewed. Patient alert and oriented x4. Patient maintained oxygen saturations above 92% on 2L nasal cannula at rest. Oxygen saturation 80% with ambulating to bathroom. Patient states that shortness of breath has improved with exertion. Patient utilizing home pulse ox to monitor self.  Noted audible expiratory wheezes throughout. Skin assessed with no new skin issues noted. Patient turned and repositioned every 2 hours and as needed with no assist. Patient continent x2. Vitals stable this shift. Call light in reach, bed in low position, wheels locked. No acute events this shift. Will continue to monitor for signs and symptoms of deterioration and report as needed.

## 2020-07-24 ENCOUNTER — Ambulatory Visit: Payer: Medicare Other | Admitting: Pulmonary Disease

## 2020-07-24 ENCOUNTER — Encounter: Payer: Self-pay | Admitting: Pulmonary Disease

## 2020-07-24 ENCOUNTER — Other Ambulatory Visit: Payer: Self-pay

## 2020-07-24 VITALS — BP 110/72 | HR 110 | Temp 97.3°F | Ht 63.0 in | Wt 239.0 lb

## 2020-07-24 DIAGNOSIS — Z79899 Other long term (current) drug therapy: Secondary | ICD-10-CM

## 2020-07-24 DIAGNOSIS — J9611 Chronic respiratory failure with hypoxia: Secondary | ICD-10-CM | POA: Diagnosis not present

## 2020-07-24 DIAGNOSIS — D869 Sarcoidosis, unspecified: Secondary | ICD-10-CM

## 2020-07-24 DIAGNOSIS — J4530 Mild persistent asthma, uncomplicated: Secondary | ICD-10-CM | POA: Diagnosis not present

## 2020-07-24 DIAGNOSIS — Z0000 Encounter for general adult medical examination without abnormal findings: Secondary | ICD-10-CM | POA: Insufficient documentation

## 2020-07-24 MED ORDER — TRELEGY ELLIPTA 200-62.5-25 MCG/INH IN AEPB: 1.0000 | INHALATION_SPRAY | Freq: Every day | RESPIRATORY_TRACT | 0 refills | Status: DC

## 2020-07-24 NOTE — Addendum Note (Signed)
Addended by: Sandra Cockayne on: 07/24/2020 03:43 PM   Modules accepted: Orders

## 2020-07-24 NOTE — Assessment & Plan Note (Signed)
Plan: Continue Trelegy Ellipta Provided samples of Trelegy Ellipta 200 as patient's currently in the donut hole and a cost $150 a month for her to receive Trelegy Ellipta 100 Referral to [facility] pulmonology

## 2020-07-24 NOTE — Assessment & Plan Note (Signed)
Patient currently in donut hole Trelegy Ellipta 100 is around $150 a month out-of-pocket cost for the patient  Plan: Samples of Trelegy Ellipta 200 provided for patient Patient can resume Trelegy Ellipta 100 when calendar year 2022 starts and she is no longer in the donut hole

## 2020-07-24 NOTE — Assessment & Plan Note (Signed)
Plan: Continue oxygen therapy Counseled patient that she is to limit POC use as she is receiving suboptimal oxygenation when using it even on maximum 5 L pulsed Encourage patient to utilize 2 to 3 L continuous flow at rest, 7 L continuous with physical exertion Offered pulmonary rehab, patient declined Referral to [facility] pulmonology

## 2020-07-24 NOTE — Assessment & Plan Note (Signed)
Advance sarcoidosis Recent acute on chronic hypoxemic respiratory failure requiring hospitalization Patient maintained on 7.5 mg of prednisone daily Patient planning on moving to [place], [place] in the next month  Plan: Continue prednisone 7.5 mg Referral to [facility] pulmonary Offered pulmonary rehab referral, patient declined as she is planning on moving within the next month

## 2020-07-24 NOTE — Progress Notes (Signed)
@Patient  ID: , female    DOB: [birth year], 69 y.o.   MRN: [id number]  Chief Complaint  Patient presents with  . Hospitalization Follow-up    Asthma exacerbation, Sarcoidosis, doing better    Referring provider: Copland, [id number], MD  HPI:  69 year old female former smoker followed in our office for pulmonary sarcoidosis  PMH: Anxiety, GERD, allergic rhinitis, chronic respiratory failure, obesity, migraines Smoker/ Smoking History: Former smoker Maintenance:  Pred 7.5mg , Trelegy Ellipta  Pt of: Dr. 62  07/24/2020  - Visit   69 year old female former smoker followed in our office for pulmonary sarcoidosis.  Patient completing follow-up with our office today after being hospitalized in October/2021.  Patient was last seen in our office in February/2021.  She is established with Dr. March/2021.  It was felt at that time that her pulmonary sarcoidosis was unstable.  She has advanced disease.  She was changed to prednisone 7.5 mg daily.  She was encouraged to remain on Trelegy Ellipta.  She was encouraged to utilize 2 to 3 L of O2 at rest and 7 L with exertion.  She was encouraged to have follow-up in 3 months.  This was never completed.  Patient was hospitalized on 07/07/2020 for acute on chronic hypoxemic respiratory failure in the setting of advanced sarcoidosis.  She was discharged on 07/12/2020.  An excerpt of that discharge summary is listed below:  Admit date: 07/07/2020 Discharge date: 07/12/2020  Admitted From: home Disposition:  home  Recommendations for Outpatient Follow-up:  1. Follow up with PCP in 1-2 weeks 2. Please obtain BMP/CBC in one week 3. Please follow up on the following pending results:  Home Health: HHPT  Equipment/Devices: 3in1  Discharge Condition: Stable Code Status:   Code Status: Full Code  Brief/Interim Summary: 69 year old female with history of pulmonary sarcoidosis, pneumothorax, IBS, GERD, asthma, chronic hypoxic respiratory  failure using 2 to 8 L oxygen at home depending upon her activity/level of exertion presented to ED 10/31st with progressively worsening shortness of breath, dyspnea on exertion, low-grade fever 99-100, hypoxia with activity. Seen in the ED, negative for flu and Covid, chest x-ray with severe diffuse interstitial prominence compatible with chronic interstitial lung disease, patient was admitted, seen by pulmonary CT angio chest repeated, was limited but no PE, showed persistent extensive areas of bronchiectasis bilaterally superimposed on background of groundglass opacification throughout both lungs consistent with patient's history of sarcoidosis. Patient was managed with pulmonary consult, aggressive IV steroids, bronchodilators. Patient slowly improved at this time discharge [place] has significantly improved, saturating well on home oxygen setting. Pulmonary has advised to continue prednisone and taper taper by decreasing 10 mg every 3 days, continue Incruse, Breo. Seen by PT and doing well and planning for home health PT in 3 months  Discharge Diagnoses:  Acute on chronic hypoxic aspiratory failure/acute asthma exacerbation in the setting of stage IV sarcoidosis: on 2-8 L [place] at home based on level of activity: Suspecting acute respiratory infection in the setting of stage IV sarcoidosis and asthma exacerbation. CT angio no new acute finding besides finding of sarcoidosis.  Seen by pulmonary completed 5 days azithromycin and will go home on prednisone taper-and then continue with baseline 7.5 mg prednisone daily, along with Mucinex, home Incruse, Breo and inhalers-sending prescription for albuterol inhaler. She will follow up with her pulmonary.  Elevated pulmonary hypertension/dilated pulmonary artery on CTA.  Continue home oxygen. Ascending aortic aneurysm 4.2 cm, stable.controlled bp, will need annual follow-up CT or MRI Stage IV sarcoidosis:  Continue her home regimen inhalers and tapering  steroid Anxiety : Mood is stable. GERD: Continue PPI IBS-continue supportive measures Lumbar radiculopathy-stable Sinus congestion/mildepistaxis/likely from oxygenuse/drybnes,not new, intermittent.  Continue afrin and follow-up with ENT.   COVID vaccination boosterreceived. Flu vaccination prior to discharge.   Patient presented to her office today reporting that she has been doing well since being discharged from the hospital.  She reports that today is her last day of 10 mg of prednisone.  Then she will be back to her baseline at 7.5 mg.  She did present on her POC.  Setting of 5 L pulsed.  Oxygenation was in the low 80s.  She is quickly transition to continuous flow here in our office and oxygen levels returned above 88%.  This is a known issue.  Patient is resistant to utilize any other forms of O2 delivery other than her POC as she is not able to move tanks around.  She is aware this puts her at a higher risk of having hypoxemia.  Patient also reports that her and her husband are planning on moving to [place], [place].  They are hopefully buying a house within the week.  They are moving to [facility] which is in [place].  She is wondering if our office can help with trying to coordinate a pulmonologist for her to establish with there so there is no breaks in her care.   Questionaires / Pulmonary Flowsheets:   ACT:  Asthma Control Test ACT Total Score  08/26/2019 13    MMRC: No flowsheet data found.  Epworth:  Results of the Epworth flowsheet 06/16/2018  Sitting and reading 2  Watching TV 2  Sitting, inactive in a public place (e.g. a theatre or a meeting) 0  As a passenger in a car for an hour without a break 0  Lying down to rest in the afternoon when circumstances permit 3  Sitting and talking to someone 0  Sitting quietly after a lunch without alcohol 2  In a car, while stopped for a few minutes in traffic 0  Total score 9    Tests:   Pulmonary tests:  PFT  08/14/16 >> 1.60 (88%), FEV1% 87, TLC 3.34 (68%), DLCO 35%, +BD  Serology:  ACE 07/08/19 >> 67  Chest imaging:  CT chest 09/20/17 >> subpleural reticulation, distortion, honeycombing b/l HRCT chest 07/20/19 >> atherosclerosis, borderline LAN, extensive cylindrical and varicose BTX, severe peribronchovascular thickening, extensive asymmetric honeycombing  Cardiac tests:  Echo 08/14/16 >> EF 60 to 65%, grade 1 DD, PAS 30 mmHg   FENO:  No results found for: NITRICOXIDE  PFT: PFT Results Latest Ref Rng & Units 08/14/2016 05/16/2014 08/22/2013  FVC-Pre L 1.65 2.29 2.35  FVC-Predicted Pre % 71 95 97  FVC-Post L 1.83 2.19 2.32  FVC-Predicted Post % 78 91 96  Pre FEV1/FVC % % 84 80 83  Post FEV1/FCV % % 87 82 82  FEV1-Pre L 1.39 1.84 1.96  FEV1-Predicted Pre % 76 98 103  FEV1-Post L 1.60 1.79 1.89  DLCO uncorrected ml/min/mmHg 8.09 9.68 12.30  DLCO UNC% % 35 42 53  DLVA Predicted % 57 67 73  TLC L 3.34 3.69 4.41  TLC % Predicted % 68 75 90  RV % Predicted % 35 61 69    WALK:  SIX MIN WALK 07/24/2016  Supplimental Oxygen during Test? (L/min) Yes  O2 Flow Rate 2  Type Continuous  Tech Comments: did not complete laps 2 and 3 d/t shortness of  breath.     Imaging: DG Chest 2 View  Result Date: 07/07/2020 CLINICAL DATA:  Shortness of breath EXAM: CHEST - 2 VIEW COMPARISON:  02/01/2019 FINDINGS: Heart is normal size. Diffuse interstitial prominence throughout the lungs, stable since prior study compatible with chronic interstitial lung disease. No definite acute process. No effusions or acute bony abnormality. IMPRESSION: Severe diffuse interstitial prominence compatible chronic interstitial lung disease. Electronically Signed   By: Charlett Nose M.D.   On: 07/07/2020 16:35   CT ANGIO CHEST PE W OR WO CONTRAST  Result Date: 07/08/2020 CLINICAL DATA:  Chest pain.  Shortness of breath. EXAM: CT ANGIOGRAPHY CHEST WITH CONTRAST TECHNIQUE: Multidetector CT imaging of the chest was  performed using the standard protocol during bolus administration of intravenous contrast. Multiplanar CT image reconstructions and MIPs were obtained to evaluate the vascular anatomy. CONTRAST:  80mL OMNIPAQUE IOHEXOL 350 MG/ML SOLN COMPARISON:  July 19, 2019. FINDINGS: Cardiovascular: Evaluation is limited by patient body habitus. Given this limitation, there was no definite acute pulmonary embolism. There is suboptimal opacification of the segmental and subsegmental branches. This finding is bilateral and symmetric and favored to be secondary to mixing artifact or contrast timing. The main pulmonary artery is significantly dilated measuring up to approximately 3.8 cm. There is aneurysmal dilatation of the ascending aorta measuring approximately 4.2 cm. There is no evidence for thoracic aortic dissection. The heart size is enlarged. Mediastinum/Nodes: --mediastinal adenopathy is again noted. This is relatively similar to prior study. --extensive hilar adenopathy is again noted, grossly similar to prior study. -- No axillary lymphadenopathy. -- No supraclavicular lymphadenopathy. -- Normal thyroid gland where visualized. -  Unremarkable esophagus. Lungs/Pleura: Again noted are extensive areas of bronchiectasis bilaterally superimposed on a background of ground-glass opacification throughout both lung fields. There is no pneumothorax. No definite focal infiltrate or large pleural effusion. There is atelectasis at the lung bases. The trachea is unremarkable. Upper Abdomen: Contrast bolus timing is not optimized for evaluation of the abdominal organs. The visualized portions of the organs of the upper abdomen are normal. Musculoskeletal: No chest wall abnormality. No bony spinal canal stenosis. Review of the MIP images confirms the above findings. IMPRESSION: 1. Evaluation is limited by patient body habitus. Given this limitation, there was no definite acute pulmonary embolism. 2. Again noted are persistent  findings of the patient's reported history of sarcoidosis as detailed above. 3. Cardiomegaly. 4. Dilated main pulmonary artery which can be seen in patients with elevated pulmonary artery pressures. 5. Stable aneurysmal dilatation of the ascending aorta measuring up to 4.2 cm. Recommend annual imaging followup by CTA or MRA. This recommendation follows 2010 ACCF/AHA/AATS/ACR/ASA/SCA/SCAI/SIR/STS/SVM Guidelines for the Diagnosis and Management of Patients with Thoracic Aortic Disease. Circulation. 2010; 121: R604-V409. Aortic aneurysm NOS (ICD10-I71.9) 6. Persistent extensive areas of bronchiectasis bilaterally superimposed on a background of ground-glass opacification throughout both lung fields. Findings are consistent with the patient's history of sarcoidosis. Aortic Atherosclerosis (ICD10-I70.0). Electronically Signed   By: Katherine Mantle M.D.   On: 07/08/2020 16:45   Portable chest 1 View  Result Date: 07/08/2020 CLINICAL DATA:  Acute on chronic respiratory failure. EXAM: PORTABLE CHEST 1 VIEW COMPARISON:  07/07/2020 and older exams. FINDINGS: Cardiac silhouette is normal in size. No mediastinal masses. Enlarged hila consistent with adenopathy as noted on previous chest CTs. Extensive irregular interstitial thickening, unchanged from the prior exam, consistent with fibrosis. No lung consolidation to suggest pneumonia. No pleural effusion or pneumothorax. IMPRESSION: 1. No change from the previous day's study. 2. Extensive  changes of sarcoidosis and interstitial lung disease. Electronically Signed   By: Amie Portland M.D.   On: 07/08/2020 06:06    Lab Results:  CBC    Component Value Date/Time   WBC 6.5 07/08/2020 0430   RBC 4.08 07/08/2020 0430   HGB 12.2 07/08/2020 0430   HCT 38.9 07/08/2020 0430   PLT 182 07/08/2020 0430   MCV 95.3 07/08/2020 0430   MCH 29.9 07/08/2020 0430   MCHC 31.4 07/08/2020 0430   RDW 13.0 07/08/2020 0430   LYMPHSABS 0.9 07/08/2020 0430   MONOABS 0.1 07/08/2020  0430   EOSABS 0.0 07/08/2020 0430   BASOSABS 0.1 07/08/2020 0430    BMET    Component Value Date/Time   NA 140 07/08/2020 0405   K 4.5 07/08/2020 0405   CL 102 07/08/2020 0405   CO2 28 07/08/2020 0405   GLUCOSE 173 (H) 07/08/2020 0405   BUN 10 07/08/2020 0405   CREATININE 0.99 07/08/2020 0405   CREATININE 1.11 (H) 06/27/2016 1544   CALCIUM 9.1 07/08/2020 0405   GFRNONAA >60 07/08/2020 0405   GFRAA 57 (L) 02/01/2019 2010    BNP    Component Value Date/Time   BNP 28.7 07/07/2020 1942    ProBNP    Component Value Date/Time   PROBNP 274.4 (H) 03/08/2014 0147    Specialty Problems      Pulmonary Problems   Diaphragmatic hernia    Status post fundoplication       Mild persistent asthma in adult without complication with component of vcd    Followed in Pulmonary clinic/ [facility]/ [facility]  Onset: late teens Triggers (environmental, infectious, allergic): foods, odors Rescue inhaler EQA:STMHDQ Maintenance medications/ response: Symbicort twice a day Smoking history:quit 1973 Family history pulmonary disease: adopted  - PFT's 08/22/2013  FEV1  1.96 (103%); ratio 83 and no change p B2 and nl FEF 25/75 p am symbicort, dLco 53 corrects to 72%> rec trial off symbicort > no change symptoms x months then flared 02/2014 and started back on pred daily    - 05/16/14 pFT's s airflow obst  - 09/20/2014 p extensive coaching HFA effectiveness =    75%         Upper airway cough syndrome    Followed in Pulmonary clinic/ [facility]  - 12/20/13  eval at [facility] x one by Dr Delford Field > rec trial of gabapentin 100 tid >  increased to 300 qid 04/11/14 > pt stopped prior to ov 06/28/14 - added back gerd rx 04/26/2014         Allergic rhinitis    Qualifier: Diagnosis of  By: Alwyn Ren MD, William        Chronic respiratory failure with hypoxia (HCC)    Followed in Pulmonary clinic/ [facility]/ [facility]  - placed on 02 at disharge from [facility] 03/08/14 >   rx 2lpm 24/7  -  05/16/2014   Walked 2lpm x one lap @ 185 stopped due to  desat to 88% at end with sob resolved on 3lpm - 05/17/2014 referred to rehab - 06/20/14  walked 674 feet with 2 breaks. The patient's lowest oxygen saturation was 84% , highest heart rate was 124 , and highest blood pressure was 124/80. The patient was on 6 liters of oxygen with a nasal cannula. Pt stated that fatigue hindered their walk test.    rx as of   .05/21/2017    2lpm       SOB (shortness of breath)   Spontaneous pneumothorax  Acute on chronic respiratory failure (HCC)      Allergies  Allergen Reactions  . Gabapentin Swelling    EDEMA WITH POSITIVE RECHALLENGE  . Lactose Intolerance (Gi) Diarrhea and Other (See Comments)    Stomach cramps  . Pineapple Hives and Itching  . Penicillins Other (See Comments)    Unknown - childhood allergy Has patient had a PCN reaction causing immediate rash, facial/tongue/throat swelling, SOB or lightheadedness with hypotension: Unknown Has patient had a PCN reaction causing severe rash involving mucus membranes or skin necrosis: Unknown Has patient had a PCN reaction that required hospitalization: Unknown Has patient had a PCN reaction occurring within the last 10 years: No If all of the above answers are "NO", then may proceed with Cephalosporin use.  . Aspirin Other (See Comments)    gastritis    Immunization History  Administered Date(s) Administered  . Fluad Quad(high Dose 65+) 06/30/2019, 07/11/2020  . Influenza Split 07/29/2012  . Influenza Whole 07/09/2002, 07/09/2009, 06/20/2010  . Influenza, High Dose Seasonal PF 06/03/2016, 06/17/2018  . Influenza,inj,Quad PF,6+ Mos 07/11/2013, 06/29/2014  . PFIZER SARS-COV-2 Vaccination 09/28/2019, 10/19/2019, 07/10/2020  . Pneumococcal Conjugate-13 06/29/2014  . Pneumococcal Polysaccharide-23 07/09/2002, 12/14/2009, 03/04/2016, 07/12/2020  . Td 09/05/2010  . Zoster Recombinat (Shingrix) 06/30/2019    Past Medical History:  Diagnosis  Date  . Anemia   . Anxiety   . Ascending aorta dilatation (HCC) 07/08/2020   See CT 07/08/20 recommend annual follow-up   . Asthma   . Barrett esophagus   . Depression   . GERD (gastroesophageal reflux disease)   . Hiatal hernia   . IBS (irritable bowel syndrome)   . Migraines   . Pneumothorax   . Pulmonary sarcoidosis (HCC)   . Supplemental oxygen dependent   . Wears dentures     Tobacco History: Social History   Tobacco Use  Smoking Status Former Smoker  . Packs/day: 1.00  . Years: 20.00  . Pack years: 20.00  . Types: Cigarettes  . Quit date: 09/08/1978  . Years since quitting: 41.9  Smokeless Tobacco Never Used   Counseling given: Yes   Continue to not smoke  Outpatient Encounter Medications as of 07/24/2020  Medication Sig  . albuterol (PROVENTIL) (2.5 MG/3ML) 0.083% nebulizer solution Take 3 mLs (2.5 mg total) by nebulization every 4 (four) hours as needed for wheezing or shortness of breath.  . ALOE VERA JUICE PO Take 120 mLs by mouth daily.   . Ascorbic Acid (VITAMIN C PO) Take 1 tablet by mouth daily. 1000 MG  . ASTRAGALUS PO Take 1 tablet by mouth daily.   . Calcium Carbonate-Vitamin D (CALCIUM-D PO) Take 1 tablet by mouth daily.  . Collagen Hydrolysate POWD Take 1 scoop by mouth daily with supper.   . Cyanocobalamin (VITAMIN B-12) 5000 MCG TBDP Take 5,000 mcg by mouth daily.  . cycloSPORINE (RESTASIS) 0.05 % ophthalmic emulsion Place 1 drop into both eyes 2 (two) times daily.   Marland Kitchen dextromethorphan (DELSYM) 30 MG/5ML liquid Take 30 mg by mouth at bedtime as needed for cough.  . diphenhydrAMINE (BENADRYL) 25 MG tablet Take 25 mg by mouth at bedtime.  . Fluticasone-Umeclidin-Vilant (TRELEGY ELLIPTA) 100-62.5-25 MCG/INH AEPB Inhale 1 puff into the lungs daily.  . furosemide (LASIX) 20 MG tablet Take 1 tablet (20 mg total) by mouth daily. Use as needed for swelling (Patient taking differently: Take 20 mg by mouth daily. )  . ipratropium (ATROVENT HFA) 17 MCG/ACT  inhaler Inhale 2 puffs into the lungs every 6 (  six) hours as needed for wheezing.  Marland Kitchen loratadine (CLARITIN) 10 MG tablet Take 10 mg by mouth daily.  . magnesium 30 MG tablet Take 30 mg by mouth daily.  . Multiple Vitamin (MULTIVITAMIN WITH MINERALS) TABS tablet Take 1 tablet by mouth daily.  . Nasal Moisturizer Combination (RHINASE) SOLN Place 2 sprays into the nose 2 (two) times daily as needed (dry, bloody nostrils).   . Omega 3-6-9 Fatty Acids (OMEGA 3-6-9 COMPLEX PO) Take 1 capsule by mouth daily.  . OXYGEN Inhale 2-4 L into the lungs continuous. If moving around it is increased  . pantoprazole (PROTONIX) 40 MG tablet Take 1 tablet (40 mg total) by mouth 2 (two) times daily.  . polyvinyl alcohol (ARTIFICIAL TEARS) 1.4 % ophthalmic solution Place 1 drop into both eyes See admin instructions. Instill 1 drop in to both eyes once or twice daily as needed for dry eyes - in between restasis doses  . predniSONE (DELTASONE) 5 MG tablet Take 1.5 tablets (7.5 mg total) by mouth daily with breakfast. Hold your home prednisone while you are on steroid taper  . Respiratory Therapy Supplies (FLUTTER) DEVI 1 application by Does not apply route 2 (two) times daily.  . [DISCONTINUED] predniSONE (DELTASONE) 10 MG tablet Take PO 4 tabs dailyx3 days,3 tabs dailyx3 days,2 tabs daily x 3 days,1 tab dailyx3 days then resume home dose of 7.5 mg daily   No facility-administered encounter medications on file as of 07/24/2020.     Review of Systems  Review of Systems  Constitutional: Positive for fatigue. Negative for activity change and fever.  HENT: Positive for congestion (clear to yellowish tint). Negative for sinus pressure, sinus pain and sore throat.   Respiratory: Positive for cough and shortness of breath. Negative for wheezing.   Cardiovascular: Positive for leg swelling. Negative for chest pain and palpitations.  Gastrointestinal: Negative for diarrhea, nausea and vomiting.  Musculoskeletal: Negative for  arthralgias.  Neurological: Negative for dizziness.  Psychiatric/Behavioral: Negative for sleep disturbance. The patient is not nervous/anxious.      Physical Exam  BP 110/72 (BP Location: Left Arm, Cuff Size: Normal)   Pulse (!) 110   Temp (!) 97.3 F (36.3 C) (Oral)   Ht  (1.6 m)   Wt 239 lb (108.4 kg)   SpO2 93%   BMI 42.34 kg/m   Wt Readings from Last 5 Encounters:  07/24/20 239 lb (108.4 kg)  07/10/20 236 lb 8.9 oz (107.3 kg)  10/20/19 235 lb 9.6 oz (106.9 kg)  08/29/19 227 lb (103 kg)  08/26/19 227 lb 3.2 oz (103.1 kg)    BMI Readings from Last 5 Encounters:  07/24/20 42.34 kg/m  07/10/20 40.60 kg/m  10/20/19 39.21 kg/m  08/29/19 37.77 kg/m  08/26/19 40.25 kg/m     Physical Exam Vitals and nursing note reviewed.  Constitutional:      General: She is not in acute distress.    Appearance: Normal appearance. She is obese.  HENT:     Head: Normocephalic and atraumatic.     Right Ear: External ear normal.     Left Ear: External ear normal.     Nose: Rhinorrhea present. No congestion.     Mouth/Throat:     Mouth: Mucous membranes are moist.     Pharynx: Oropharynx is clear.  Eyes:     Pupils: Pupils are equal, round, and reactive to light.  Cardiovascular:     Rate and Rhythm: Normal rate and regular rhythm.     Pulses:  Normal pulses.     Heart sounds: Normal heart sounds. No murmur heard.   Pulmonary:     Effort: Pulmonary effort is normal. No respiratory distress.     Breath sounds: No decreased air movement. No decreased breath sounds, wheezing or rales.  Musculoskeletal:     Cervical back: Normal range of motion.     Right lower leg: 2+ Edema present.     Left lower leg: 2+ Edema present.  Skin:    General: Skin is warm and dry.     Capillary Refill: Capillary refill takes less than 2 seconds.  Neurological:     General: No focal deficit present.     Mental Status: She is alert and oriented to person, place, and time. Mental status is at  baseline.     Gait: Gait normal.  Psychiatric:        Mood and Affect: Mood normal.        Behavior: Behavior normal.        Thought Content: Thought content normal.        Judgment: Judgment normal.       Assessment & Plan:   Chronic respiratory failure with hypoxia (HCC) Plan: Continue oxygen therapy Counseled patient that she is to limit POC use as she is receiving suboptimal oxygenation when using it even on maximum 5 L pulsed Encourage patient to utilize 2 to 3 L continuous flow at rest, 7 L continuous with physical exertion Offered pulmonary rehab, patient declined [facility]  Mild persistent asthma in adult without complication with component of vcd Plan: Continue Trelegy Ellipta Provided samples of Trelegy Ellipta 200 as patient's currently in the donut hole and a cost $150 a month for her to receive Trelegy Ellipta 100 [facility]  Healthcare maintenance Up-to-date with COVID-19 vaccinations Up-to-date with flu vaccine  Medication management Patient currently in donut hole Trelegy Ellipta 100 is around $150 a month out-of-pocket cost for the patient  Plan: Samples of Trelegy Ellipta 200 provided for patient Patient can resume Trelegy Ellipta 100 when calendar year 2022 starts and she is no longer in the donut hole  PULMONARY SARCOIDOSIS Advance sarcoidosis Recent acute on chronic hypoxemic respiratory failure requiring hospitalization Patient maintained on 7.5 mg of prednisone daily Patient planning on moving to [place] in the next month  Plan: Continue prednisone 7.5 mg [facility] Offered pulmonary rehab referral, patient declined as she is planning on moving within the next month    Return in about 3 months (around 10/24/2020), or if symptoms worsen or fail to improve, for Follow up with Dr. Craige CottaSood.   Coral CeoBrian P Landy Dunnavant, NP 07/24/2020   This appointment required 32 minutes of patient care  (this includes precharting, chart review, review of results, face-to-face care, etc.).

## 2020-07-24 NOTE — Addendum Note (Signed)
Addended by: Sandra Cockayne on: 07/24/2020 03:35 PM   Modules accepted: Orders

## 2020-07-24 NOTE — Assessment & Plan Note (Signed)
Up-to-date with COVID-19 vaccinations °Up-to-date with flu vaccine ° °

## 2020-07-24 NOTE — Progress Notes (Signed)
Reviewed and agree with assessment/plan.   Coralyn Helling, MD [facility] Pulmonary/Critical Care 07/24/2020, 12:16 PM Pager:  [phone]

## 2020-07-24 NOTE — Patient Instructions (Addendum)
You were seen today by Coral Ceo, NP  for:   1. PULMONARY SARCOIDOSIS  - Ambulatory referral to Pulmonology  Continue prednisone 7.5 mg daily  We have placed a referral to pulmonology in [place], [place], [place]: Pulm Sarcoid - pt moving to [place], [place] area  [employer] [facility] [employer]  If you end up not moving please contact her office and we can place a referral to pulmonary rehab as discussed today  2. Chronic respiratory failure with hypoxia (HCC)  As reviewed today please continue 2 to 3 L continuous at rest, with exertion please increase O2 to 7 L continuous  When using your POC please have it set on the maximum setting of 5 L pulsed, this is still subtherapeutic for treatment  Continue oxygen therapy as prescribed  >>>maintain oxygen saturations greater than 88 percent  >>>if unable to maintain oxygen saturations please contact the office  >>>do not smoke with oxygen  >>>can use nasal saline gel or nasal saline rinses to moisturize nose if oxygen causes dryness   3. Mild persistent asthma in adult without complication with component of vcd  [place] Trelegy Ellipta 200 >>> 1 puff daily in the morning >>>rinse mouth out after use  >>> This inhaler contains 3 medications that help manage her respiratory status, contact our office if you cannot afford this medication or unable to remain on this medication  This is the same medication she previously been on with little bit of a higher inhaled corticosteroid, rinse your mouth out after use  Hold Trelegy Ellipta 100  You can resume Trelegy Ellipta 100 at the start of the year once you're no longer in the donut hole  4. Medication management  Since you're in the donut hole in the such a high cost of your Trelegy Ellipta we will provide you samples of Trelegy Ellipta 200  5. Healthcare maintenance  Please discuss with primary care getting referral to establish with a PCP in [place], [place]   We recommend today:  Orders  Placed This Encounter  Procedures  . Ambulatory referral to Pulmonology    Referral Priority:   Routine    Referral Type:   Consultation    Referral Reason:   Specialty Services Required    Requested Specialty:   Pulmonary Disease    Number of Visits Requested:   1   Orders Placed This Encounter  Procedures  . Ambulatory referral to Pulmonology   No orders of the defined types were placed in this encounter.   Follow Up:    Return in about 3 months (around 10/24/2020), or if symptoms worsen or fail to improve, for Follow up with Dr. Craige Cotta.   Notification of test results are managed in the following manner: If there are  any recommendations or changes to the  plan of care discussed in office today,  we will contact you and let you know what they are. If you do not hear from [place], then your results are normal and you can view them through your  MyChart account , or a letter will be sent to you. Thank you again for trusting [place] with your care  - Thank you, [facility]    It is flu season:   >>> Best ways to protect herself from the flu: Receive the yearly flu vaccine, practice good hand hygiene washing with soap and also using hand sanitizer when available, eat a nutritious meals, get adequate rest, hydrate appropriately       Please  contact the office if your symptoms worsen or you have concerns that you are not improving.   Thank you for choosing [facility] for your healthcare, and for allowing [place] to partner with you on your healthcare journey. I am thankful to be able to provide care to you today.   Wyn Quaker FNP-C

## 2020-07-25 DIAGNOSIS — D869 Sarcoidosis, unspecified: Secondary | ICD-10-CM | POA: Diagnosis not present

## 2020-07-25 DIAGNOSIS — J45909 Unspecified asthma, uncomplicated: Secondary | ICD-10-CM | POA: Diagnosis not present

## 2020-07-30 ENCOUNTER — Telehealth: Payer: Self-pay | Admitting: Family Medicine

## 2020-07-30 MED ORDER — ALBUTEROL SULFATE (2.5 MG/3ML) 0.083% IN NEBU: 2.5000 mg | INHALATION_SOLUTION | RESPIRATORY_TRACT | 2 refills | Status: AC | PRN

## 2020-07-30 NOTE — Telephone Encounter (Signed)
Medication refilled

## 2020-07-30 NOTE — Telephone Encounter (Signed)
Medication: albuterol (VENTOLIN HFA) 108 (90 Base) MCG/ACT inhaler [[id number]] DISCONTINUED  Has the patient contacted their pharmacy? No. (If no, request that the patient contact the pharmacy for the refill.) (If yes, when and what did the pharmacy advise?)  Preferred Pharmacy (with phone number or street name):  [id number] - [place], [place] - [address] AT [facility] OF [address] Phone:  [phone]  Fax:  [phone]       Agent: Please be advised that RX refills may take up to 3 business days. We ask that you follow-up with your pharmacy.

## 2020-08-06 ENCOUNTER — Telehealth: Payer: Self-pay | Admitting: Gastroenterology

## 2020-08-06 ENCOUNTER — Telehealth: Payer: Self-pay | Admitting: Family Medicine

## 2020-08-06 DIAGNOSIS — R600 Localized edema: Secondary | ICD-10-CM

## 2020-08-06 MED ORDER — PANTOPRAZOLE SODIUM 40 MG PO TBEC: 40.0000 mg | DELAYED_RELEASE_TABLET | Freq: Two times a day (BID) | ORAL | 1 refills | Status: AC

## 2020-08-06 MED ORDER — FUROSEMIDE 20 MG PO TABS: 20.0000 mg | ORAL_TABLET | Freq: Every day | ORAL | 1 refills | Status: AC

## 2020-08-06 NOTE — Telephone Encounter (Signed)
Patient states she needs her Ventolin inhaler      [id number] - [place], [place] - [address] AT [facility] [address]  [address], [place] [address]  Phone:  [phone] Fax:  [phone]

## 2020-08-06 NOTE — Telephone Encounter (Signed)
Pt last seen 08-2019. Sent refill - pt needs an annual visit for any more refills

## 2020-08-06 NOTE — Telephone Encounter (Signed)
Medications refilled to pharmacy.  

## 2020-08-06 NOTE — Telephone Encounter (Signed)
Medication: furosemide (LASIX) 20 MG tablet [[id number]   Has the patient contacted their pharmacy? No. (If no, request that the patient contact the pharmacy for the refill.) (If yes, when and what did the pharmacy advise?)  Preferred Pharmacy (with phone number or street name): [id number] - [place], [place] - [address] AT [facility] OF [address]  [address], [place] [address]  Phone:  [phone] Fax:  [phone]  DEA #:  [id number]  Agent: Please be advised that RX refills may take up to 3 business days. We ask that you follow-up with your pharmacy.

## 2020-08-07 ENCOUNTER — Telehealth: Payer: Self-pay | Admitting: Family Medicine

## 2020-08-07 ENCOUNTER — Encounter: Payer: Self-pay | Admitting: Family Medicine

## 2020-08-07 DIAGNOSIS — D860 Sarcoidosis of lung: Secondary | ICD-10-CM

## 2020-08-07 MED ORDER — ALBUTEROL SULFATE HFA 108 (90 BASE) MCG/ACT IN AERS: INHALATION_SPRAY | RESPIRATORY_TRACT | 3 refills | Status: DC

## 2020-08-07 NOTE — Telephone Encounter (Signed)
Ventolin not currently on medication list. However it is on history. Please advise if ok to refill.

## 2020-08-07 NOTE — Telephone Encounter (Signed)
Medication sent to pharmacy. This is her rescue inhaler.

## 2020-08-07 NOTE — Telephone Encounter (Signed)
Medication: albuterol (VENTOLIN HFA) 108 (90 Base) MCG/ACT inhaler [[id number]] DISCONTINUED   Has the patient contacted their pharmacy? No. (If no, request that the patient contact the pharmacy for the refill.) (If yes, when and what did the pharmacy advise?)  Preferred Pharmacy (with phone number or street name): [id number] - [place], [place] - [address]  [address] [place] [address]  Phone:  [phone] Fax:  [phone]  DEA #:  [id number]  Agent: Please be advised that RX refills may take up to 3 business days. We ask that you follow-up with your pharmacy.

## 2020-08-10 ENCOUNTER — Other Ambulatory Visit: Payer: Self-pay

## 2020-08-10 ENCOUNTER — Ambulatory Visit: Payer: Medicare Other | Admitting: Pulmonary Disease

## 2020-08-10 ENCOUNTER — Encounter: Payer: Self-pay | Admitting: Pulmonary Disease

## 2020-08-10 VITALS — BP 112/72 | HR 107 | Temp 97.3°F | Ht 63.0 in | Wt 238.7 lb

## 2020-08-10 DIAGNOSIS — J4530 Mild persistent asthma, uncomplicated: Secondary | ICD-10-CM | POA: Diagnosis not present

## 2020-08-10 DIAGNOSIS — D860 Sarcoidosis of lung: Secondary | ICD-10-CM | POA: Diagnosis not present

## 2020-08-10 DIAGNOSIS — J9611 Chronic respiratory failure with hypoxia: Secondary | ICD-10-CM | POA: Diagnosis not present

## 2020-08-10 MED ORDER — ALBUTEROL SULFATE HFA 108 (90 BASE) MCG/ACT IN AERS: INHALATION_SPRAY | RESPIRATORY_TRACT | 3 refills | Status: AC

## 2020-08-10 NOTE — Progress Notes (Signed)
[facility]  Chief Complaint  Patient presents with  . Follow-up    Sarcoidosis, still SOB with exerction    Constitutional:  BP 112/72 (BP Location: Left Arm, Cuff Size: Normal)   Pulse (!) 107   Temp (!) 97.3 F (36.3 C) (Oral)   Ht 5\' 3"  (1.6 m)   Wt 238 lb 11.2 oz (108.3 kg)   SpO2 96%   BMI 42.28 kg/m   Past Medical History:  Anemia, Anxiety, Barrett's Esophagus, Depression, HH, IBS, PTX  Past Surgical History:  Her  has a past surgical history that includes Cholecystectomy; Rotator cuff repair; Cataract extraction, bilateral; Bronchial biopsy (1996); Nissen fundoplication; BRAVO ph study (N/A, 10/10/2013); Esophagogastroduodenoscopy (N/A, 10/10/2013); Chest tube insertion (Right, 04/23/2017); Video bronchoscopy with insertion of interbronchial valve (ibv) (N/A, 05/08/2017); Appendectomy; and Video bronchoscopy with insertion of interbronchial valve (ibv) (N/A, 07/10/2017).  Brief Summary:  [name] is a 69 y.o. female former smoker with sarcoidosis, asthma, and chronic respiratory failure.      Subjective:   Her oxygen level drops when she does any activity.  Okay at rest on 3 liters.  Has dry cough.  Gets nose bleed from Lt nostril.  Using Ayr and afrin.  No having wheeze, sputum, fever, or hemoptysis.  Physical Exam:   Appearance - well kempt, in wheelchair wearing oxygen  ENMT - no sinus tenderness, no oral exudate, no LAN, Mallampati 3 airway, no stridor, scab in Lt nares  Respiratory - b/l crackles  CV - s1s2 regular rate and rhythm, no murmurs  Ext - no clubbing, no edema  Skin - no rashes  Psych - normal mood and affect   Pulmonary testing:   PFT 08/14/16 >> 1.60 (88%), FEV1% 87, TLC 3.34 (68%), DLCO 35%, +BD  ACE 07/08/19 >> 67  Chest Imaging:   CT chest 09/20/17 >> subpleural reticulation, distortion, honeycombing b/l  HRCT chest 07/20/19 >> atherosclerosis, borderline LAN, extensive cylindrical  and varicose BTX, severe peribronchovascular thickening, extensive asymmetric honeycombing  CT angio chest 07/08/20 >> extensive BTX with GGO, ascending aorta 4.2 cm  Cardiac Tests:   Echo 08/14/16 >> EF 60 to 65%, mild LVH, grade 1 DD, aortic root 38 mm, PAS 30 mmHg  Social History:  She  reports that she quit smoking about 41 years ago. Her smoking use included cigarettes. She has a 20.00 pack-year smoking history. She has never used smokeless tobacco. She reports that she does not drink alcohol and does not use drugs.  Family History:  Her family history is not on file. She was adopted.     Assessment/Plan:   Pulmonary sarcoidosis with bronchiectasis. - she has advanced disease - continue prednisone 7 Unknown mg daily - she will arrange for pulmonary care in 14/07/17 - pulmonary hygiene  Persistent asthma. - continue trelegy with prn albuterol  Chronic respiratory failure with hypoxia. - explained how her oxygen level can drop with activity given her advanced lung disease - continue 2 to 3 liters oxygen at rest and 7 liters with exertion - she is using an oximyzer  Nose bleed. - advised her to limit use of afrin - continue Ayr  Time Spent Involved in Patient Care on Day of Examination:  31 minutes  Follow up:  Patient Instructions  Let [place] know if you need any assistance before you move to [facility]   Medication List:   Allergies as of 08/10/2020      Reactions   Gabapentin Swelling  EDEMA WITH POSITIVE RECHALLENGE   Lactose Intolerance (gi) Diarrhea, Other (See Comments)   Stomach cramps   Pineapple Hives, Itching   Penicillins Other (See Comments)   Unknown - childhood allergy Has patient had a PCN reaction causing immediate rash, facial/tongue/throat swelling, SOB or lightheadedness with hypotension: Unknown Has patient had a PCN reaction causing severe rash involving mucus membranes or skin necrosis: Unknown Has patient had a PCN reaction that required  hospitalization: Unknown Has patient had a PCN reaction occurring within the last 10 years: No If all of the above answers are "NO", then may proceed with Cephalosporin use.   Aspirin Other (See Comments)   gastritis      Medication List       Accurate as of August 10, 2020 10:37 AM. If you have any questions, ask your nurse or doctor.        albuterol (2.5 MG/3ML) 0.083% nebulizer solution Commonly known as: PROVENTIL Take 3 mLs (2.5 mg total) by nebulization every 4 (four) hours as needed for wheezing or shortness of breath. NEEDS OV FOR FURTHER REFILLS   albuterol 108 (90 Base) MCG/ACT inhaler Commonly known as: Ventolin HFA INHALE 2 PUFFS INTO THE LUNGS EVERY 4 (FOUR) HOURS AS NEEDED FOR WHEEZING OR SHORTNESS OF BREATH.   ALOE VERA JUICE PO Take 120 mLs by mouth daily.   Artificial Tears 1.4 % ophthalmic solution Generic drug: polyvinyl alcohol Place 1 drop into both eyes See admin instructions. Instill 1 drop in to both eyes once or twice daily as needed for dry eyes - in between restasis doses   ASTRAGALUS PO Take 1 tablet by mouth daily.   CALCIUM-D PO Take 1 tablet by mouth daily.   Collagen Hydrolysate Powd Take 1 scoop by mouth daily with supper.   cycloSPORINE 0.05 % ophthalmic emulsion Commonly known as: RESTASIS Place 1 drop into both eyes 2 (two) times daily.   Delsym 30 MG/5ML liquid Generic drug: dextromethorphan Take 30 mg by mouth at bedtime as needed for cough.   diphenhydrAMINE 25 MG tablet Commonly known as: BENADRYL Take 25 mg by mouth at bedtime.   Flutter Devi 1 application by Does not apply route 2 (two) times daily.   furosemide 20 MG tablet Commonly known as: LASIX Take 1 tablet (20 mg total) by mouth daily.   ipratropium 17 MCG/ACT inhaler Commonly known as: ATROVENT HFA Inhale 2 puffs into the lungs every 6 (six) hours as needed for wheezing.   loratadine 10 MG tablet Commonly known as: CLARITIN Take 10 mg by mouth daily.     magnesium 30 MG tablet Take 30 mg by mouth daily.   multivitamin with minerals Tabs tablet Take 1 tablet by mouth daily.   OMEGA 3-6-9 COMPLEX PO Take 1 capsule by mouth daily.   OXYGEN Inhale 2-4 L into the lungs continuous. If moving around it is increased   pantoprazole 40 MG tablet Commonly known as: PROTONIX Take 1 tablet (40 mg total) by mouth 2 (two) times daily. Please schedule annual follow up for any further refills. Thank you   predniSONE 5 MG tablet Commonly known as: DELTASONE Take 1.5 tablets (7.5 mg total) by mouth daily with breakfast. Hold your home prednisone while you are on steroid taper   Rhinase Soln Place 2 sprays into the nose 2 (two) times daily as needed (dry, bloody nostrils).   Trelegy Ellipta 100-62.5-25 MCG/INH Aepb Generic drug: Fluticasone-Umeclidin-Vilant Inhale 1 puff into the lungs daily. What changed: Another medication with the same  name was removed. Continue taking this medication, and follow the directions you see here. Changed by: Coralyn Helling, MD   Vitamin B-12 5000 MCG Tbdp Take 5,000 mcg by mouth daily.   VITAMIN C PO Take 1 tablet by mouth daily. 1000 MG       Signature:  Coralyn Helling, MD [facility] Pulmonary/Critical Care Pager - [phone] 08/10/2020, 10:37 AM

## 2020-08-10 NOTE — Patient Instructions (Addendum)
Let us know if you need any assistance before you move to [facility]

## 2020-08-24 DIAGNOSIS — J45909 Unspecified asthma, uncomplicated: Secondary | ICD-10-CM | POA: Diagnosis not present

## 2020-08-24 DIAGNOSIS — D869 Sarcoidosis, unspecified: Secondary | ICD-10-CM | POA: Diagnosis not present

## 2020-09-10 ENCOUNTER — Telehealth: Payer: Self-pay | Admitting: Pulmonary Disease

## 2020-09-10 DIAGNOSIS — J9611 Chronic respiratory failure with hypoxia: Secondary | ICD-10-CM

## 2020-09-10 NOTE — Telephone Encounter (Signed)
Spoke with patient. She is aware that the order was placed earlier today. She verbalized understanding and appreciation.   Nothing further needed at time of call.

## 2020-09-10 NOTE — Telephone Encounter (Signed)
Spoke with patient. She stated that she gets her O2 from [facility]. She had a POC that originally went beyond 5L. This POC stopped working a few weeks ago and they replaced it with a POC that will only go to 5L. She explained to them that is not the order that VS sent in. I reviewed her chart, last order for O2 was sent in 2020 for her to be on 2L at rest and 7L with exertion. She is currently using 4L with exertion. Her O2 levels dropped to 53% on Friday while closing on her house. She stated that she had to "huff and puff" for her O2 to return to 90%.   She wants to know if we can send an order to [facility] to have the order changed for her to get a POC that goes above 5L. Patient has moved to [facility] and was told by [facility] that they have an office in [place] but she does not have any information.   VS, please advise. Thanks!

## 2020-09-10 NOTE — Telephone Encounter (Signed)
Okay to send order for new oxygen concentrator that goes up 5 liters.  Please also see if they can get her set up with an oximyzer.

## 2020-09-10 NOTE — Telephone Encounter (Signed)
I have sent the orders per Dr Craige Cotta to [facility]  Called pt to let her know but had to [facility]

## 2020-09-11 ENCOUNTER — Telehealth: Payer: Self-pay | Admitting: Pulmonary Disease

## 2020-09-11 NOTE — Telephone Encounter (Signed)
Called and spoke with Selena Batten with [facility] she advised me that they do not have 10L POCs and that the patient moved to [place], [place] at the end of December.  She has not established with a new provider in [place] and [facility] cannot transfer her account until she sees a new provider in [place].    Dr. Craige Cotta, please advise on how to respond to patient request regarding POC now that she is living in [place].  Thank you.

## 2020-09-12 ENCOUNTER — Telehealth: Payer: Self-pay | Admitting: Family Medicine

## 2020-09-12 NOTE — Telephone Encounter (Signed)
Pt is returning call - wanting to know when someone will call her back - she is PISSED and wants a return call in 10 minutes (pt asked me to put this in the note) - CB# [phone]

## 2020-09-12 NOTE — Telephone Encounter (Signed)
Jeanette called from [facility] in reference to patient's o2 and how many liters patient is currently on. Per Para March, patient is moved out of state to [place] and needs to find someone to supply oxygen to her there.

## 2020-09-12 NOTE — Telephone Encounter (Signed)
Called and spoke with pt letting her know the info stated by VS and she stated that she has an appt arranged with a doctor in [place] but that appt is not until 09/25/20.  Pt stated that she has contacted [facility] and stated that they should be contacting our office. Stated to pt that we would await call from [facility] and she verbalized understanding. Nothing further needed.

## 2020-09-12 NOTE — Telephone Encounter (Signed)
Unfortunately, it seems that she needs to find a provider in [place] that can arrange for home oxygen set up there.  Until then when she is travelling outside of her home, she would need to use oxygen tanks and should make sure that she has enough tanks to last through her travel time.    Is it possible for [facility] to arrange for an oximyzer to be mailed to her?  This would help some as an oxygen reservoir.

## 2020-10-09 DEATH — deceased

## 2020-11-13 NOTE — Progress Notes (Deleted)
Chronic Care Management Pharmacy Note  11/13/2020 Name:  [name] MRN:  [id number] DOB:  18-Dec-1950  Subjective: [name] is an 70 y.o. year old female who is a primary patient of Copland, Gay Filler, MD.  The CCM team was consulted for assistance with disease management and care coordination needs.    Engaged with patient by telephone for follow up visit in response to provider referral for pharmacy case management and/or care coordination services.   Consent to Services:  The patient was given the following information about Chronic Care Management services today, agreed to services, and gave verbal consent: 1. CCM service includes personalized support from designated clinical staff supervised by the primary care provider, including individualized plan of care and coordination with other care providers 2. 24/7 contact phone numbers for assistance for urgent and routine care needs. 3. Service will only be billed when office clinical staff spend 20 minutes or more in a month to coordinate care. 4. Only one practitioner may furnish and bill the service in a calendar month. 5.The patient may stop CCM services at any time (effective at the end of the month) by phone call to the office staff. 6. The patient will be responsible for cost sharing (co-pay) of up to 20% of the service fee (after annual deductible is met). Patient agreed to services and consent obtained.  Patient Care Team: Copland, Gay Filler, MD as PCP - General (Family Medicine) Calvert Cantor, MD as Consulting Physician (Ophthalmology) Armbruster, Carlota Raspberry, MD as Consulting Physician (Gastroenterology) Leta Baptist, MD as Consulting Physician (Otolaryngology) Rigoberto Noel, MD as Consulting Physician (Pulmonary Disease) Day, Melvenia Beam, [facility] (Inactive) as Pharmacist (Pharmacist)  Recent office visits: None since last CCM visit  Recent consult visits: 08/10/20 Halford Chessman, pulmonary) - Trelegy dose was decreased to  100-62.5-25mcg  Hospital visits: 07/07/20 (ED) - patient presents with worsening SOB.  She is up to 8-10L at home when ambulating.  Social Hx:  Born in [place]. Raised in [place].  Married.  Daughter lives in [place]. Son and Daughter in [place] live in [place].  Brother in Sports coach passed from [place] in January 2021.  She stays inside her home and is very particular about making sure everyone that comes into her home is masked and wearing shoe covers. She disinfects everything that comes into her house even before COVID.  Met a group of people on the internet that she does a prayer group with ladies from around the world ([place], [place], [place], [place], [place]). This brings her comfort to prevent depression.  She is planning to look for a home in [place]. Her goal was to move in July, but the housing market is a hindrance. She is now anticipating a goal of October 2021.  Objective:  Lab Results  Component Value Date   CREATININE 0.99 07/08/2020   BUN 10 07/08/2020   GFR 58.67 (L) 07/06/2019   GFRNONAA >60 07/08/2020   GFRAA 57 (L) 02/01/2019   NA 140 07/08/2020   K 4.5 07/08/2020   CALCIUM 9.1 07/08/2020   CO2 28 07/08/2020    Lab Results  Component Value Date/Time   HGBA1C 5.5 09/27/2018 11:49 AM   GFR 58.67 (L) 07/06/2019 05:13 PM   GFR 49.88 (L) 09/27/2018 11:49 AM    Last diabetic Eye exam: No results found for: HMDIABEYEEXA  Last diabetic Foot exam: No results found for: HMDIABFOOTEX   Lab Results  Component Value Date   CHOL 197 09/27/2018   HDL 63.40 09/27/2018  LDLCALC 116 (H) 09/27/2018   TRIG 88.0 09/27/2018   CHOLHDL 3 09/27/2018    Hepatic Function Latest Ref Rng & Units 07/08/2020 07/06/2019 09/27/2018  Total Protein 6.5 - 8.1 g/dL 7.6 8.5(H) 8.4(H)  Albumin 3.5 - 5.0 g/dL 3.4(L) 4.4 4.5  AST 15 - 41 U/L 29 30 26   ALT 0 - 44 U/L 22 16 17   Alk Phosphatase 38 - 126 U/L 53 58 68  Total Bilirubin 0.3 - 1.2 mg/dL 0.6 0.3 0.4  Bilirubin, Direct 0.0 -  0.3 mg/dL - - -    Lab Results  Component Value Date/Time   TSH 1.38 09/03/2016 02:12 PM   TSH 1.85 01/26/2015 02:49 PM   FREET4 0.88 07/29/2012 02:48 PM   FREET4 1.05 01/17/2011 04:44 PM    CBC Latest Ref Rng & Units 07/08/2020 07/07/2020 07/07/2020  WBC 4.0 - 10.5 K/uL 6.5 8.5 -  Hemoglobin 12.0 - 15.0 g/dL 12.2 13.2 13.9  Hematocrit 36.0 - 46.0 % 38.9 43.1 41.0  Platelets 150 - 400 K/uL 182 268 -    No results found for: VD25OH  Clinical ASCVD: {YES/NO:21197} The 10-year ASCVD risk score Mikey Bussing DC Jr., et al., 2013) is: 11%   Values used to calculate the score:     Age: 42 years     Sex: Female     Is Non-Hispanic African American: Yes     Diabetic: No     Tobacco smoker: No     Systolic Blood Pressure: 993 mmHg     Is BP treated: No     HDL Cholesterol: 63.4 mg/dL     Total Cholesterol: 197 mg/dL    Depression screen [facility] 2/9 11/25/2019 09/27/2018 09/24/2017  Decreased Interest 0 0 0  Down, Depressed, Hopeless 1 0 0  PHQ - 2 Score 1 0 0  Some recent data might be hidden     ***Other: (CHADS2VASc if Afib, MMRC or CAT for COPD, ACT, DEXA)  Social History   Tobacco Use  Smoking Status Former Smoker  . Packs/day: 1.00  . Years: 20.00  . Pack years: 20.00  . Types: Cigarettes  . Quit date: 09/08/1978  . Years since quitting: 42.2  Smokeless Tobacco Never Used   BP Readings from Last 3 Encounters:  08/10/20 112/72  07/24/20 110/72  07/12/20 109/73   Pulse Readings from Last 3 Encounters:  08/10/20 (!) 107  07/24/20 (!) 110  07/12/20 92   Wt Readings from Last 3 Encounters:  08/10/20 238 lb 11.2 oz (108.3 kg)  07/24/20 239 lb (108.4 kg)  07/10/20 236 lb 8.9 oz (107.3 kg)    Assessment/Interventions: Review of patient past medical history, allergies, medications, health status, including review of consultants reports, laboratory and other test data, was performed as part of comprehensive evaluation and provision of chronic care management services.   SDOH:   (Social Determinants of Health) assessments and interventions performed: {yes/no:20286}   CCM Care Plan  Allergies  Allergen Reactions  . Gabapentin Swelling    EDEMA WITH POSITIVE RECHALLENGE  . Lactose Intolerance (Gi) Diarrhea and Other (See Comments)    Stomach cramps  . Pineapple Hives and Itching  . Penicillins Other (See Comments)    Unknown - childhood allergy Has patient had a PCN reaction causing immediate rash, facial/tongue/throat swelling, SOB or lightheadedness with hypotension: Unknown Has patient had a PCN reaction causing severe rash involving mucus membranes or skin necrosis: Unknown Has patient had a PCN reaction that required hospitalization: Unknown Has patient had a PCN  reaction occurring within the last 10 years: No If all of the above answers are "NO", then may proceed with Cephalosporin use.  . Aspirin Other (See Comments)    gastritis    Medications Reviewed Today    Reviewed by Chesley Mires, MD (Physician) on 08/10/20 at 1019  Med List Status: <None>  Medication Order Taking? Sig Documenting Provider Last Dose Status Informant  albuterol (PROVENTIL) (2.5 MG/3ML) 0.083% nebulizer solution [id number] Yes Take 3 mLs (2.5 mg total) by nebulization every 4 (four) hours as needed for wheezing or shortness of breath. NEEDS OV FOR FURTHER REFILLS Copland, Gay Filler, MD Taking Active   albuterol (VENTOLIN HFA) 108 (90 Base) MCG/ACT inhaler [id number] Yes INHALE 2 PUFFS INTO THE LUNGS EVERY 4 (FOUR) HOURS AS NEEDED FOR WHEEZING OR SHORTNESS OF BREATH. Chesley Mires, MD  Active   ALOE VERA JUICE PO [id number] Yes Take 120 mLs by mouth daily.  [provider] Taking Active Self  Ascorbic Acid (VITAMIN C PO) [id number] Yes Take 1 tablet by mouth daily. 1000 MG [provider] Taking Active Self  ASTRAGALUS PO [id number] Yes Take 1 tablet by mouth daily.  [provider] Taking Active Self  Calcium Carbonate-Vitamin D (CALCIUM-D PO) [id number] Yes Take  1 tablet by mouth daily. [provider] Taking Active Self  Collagen Hydrolysate POWD [id number] Yes Take 1 scoop by mouth daily with supper.  [provider] Taking Active Self  Cyanocobalamin (VITAMIN B-12) 5000 MCG TBDP [id number] Yes Take 5,000 mcg by mouth daily. [provider] Taking Active Self  cycloSPORINE (RESTASIS) 0.05 % ophthalmic emulsion [id number] Yes Place 1 drop into both eyes 2 (two) times daily.  [provider] Taking Active Self  dextromethorphan (DELSYM) 30 MG/5ML liquid [id number] Yes Take 30 mg by mouth at bedtime as needed for cough. [provider] Taking Active Self  diphenhydrAMINE (BENADRYL) 25 MG tablet [id number] Yes Take 25 mg by mouth at bedtime. [provider] Taking Active Self  Fluticasone-Umeclidin-Vilant (TRELEGY ELLIPTA) 100-62.5-25 MCG/INH AEPB [id number] Yes Inhale 1 puff into the lungs daily. Chesley Mires, MD Taking Active Self  Fluticasone-Umeclidin-Vilant (TRELEGY ELLIPTA) 200-62.5-25 MCG/INH AEPB [id number] Yes Inhale 1 puff into the lungs daily. Lauraine Rinne, NP Taking Active   furosemide (LASIX) 20 MG tablet [id number] Yes Take 1 tablet (20 mg total) by mouth daily. Copland, Gay Filler, MD Taking Active   ipratropium (ATROVENT HFA) 17 MCG/ACT inhaler [id number] Yes Inhale 2 puffs into the lungs every 6 (six) hours as needed for wheezing. Saguier, Percell Miller, PA-C Taking Active Self  loratadine (CLARITIN) 10 MG tablet [id number] Yes Take 10 mg by mouth daily. [provider] Taking Active Self  magnesium 30 MG tablet [id number] Yes Take 30 mg by mouth daily. [provider] Taking Active Self  Multiple Vitamin (MULTIVITAMIN WITH MINERALS) TABS tablet [id number] Yes Take 1 tablet by mouth daily. [provider] Taking Active Self  Nasal Moisturizer Combination (RHINASE) SOLN [id number] Yes Place 2 sprays into the nose 2 (two) times daily as needed (dry, bloody nostrils).  [provider] Taking Active Self  Omega 3-6-9 Fatty Acids (OMEGA 3-6-9 COMPLEX PO) [id number] Yes Take 1 capsule by mouth daily. [provider] Taking Active Self  OXYGEN [id number] Yes Inhale 2-4 L into the lungs continuous. If moving around it is increased [provider] Taking Active Self  pantoprazole (PROTONIX) 40 MG tablet [id number] Yes Take 1 tablet (40 mg total) by mouth 2 (two) times daily. Please schedule annual follow up  for any further refills. Thank you Armbruster, Carlota Raspberry, MD Taking Active   polyvinyl alcohol (ARTIFICIAL TEARS) 1.4 % ophthalmic solution [id number] Yes Place 1 drop into both eyes See admin instructions. Instill 1 drop in to both eyes once or twice daily as needed for dry eyes - in between restasis doses [provider] Taking Active Self  predniSONE (DELTASONE) 5 MG tablet [id number] Yes Take 1.5 tablets (7.5 mg total) by mouth daily with breakfast. Hold your home prednisone while you are on steroid taper Antonieta Pert, MD Taking Active   Respiratory Therapy Supplies (FLUTTER) DEVI [id number] Yes 1 application by Does not apply route 2 (two) times daily. Chesley Mires, MD Taking Active Self          Patient Active Problem List   Diagnosis Date Noted  . Healthcare maintenance 07/24/2020  . Medication management 07/24/2020  . Ascending aorta dilatation ([place]) 07/08/2020  . Acute on chronic respiratory failure ([place]) 07/07/2020  . Other chest pain   . Spontaneous pneumothorax 04/18/2017  . SOB (shortness of breath) 06/05/2016  . Osteopenia 02/12/2016  . Lumbar radiculopathy 01/26/2015  . Chronic respiratory failure with hypoxia ([place]) 04/12/2014  . Obesity (BMI 30-39.9) 02/08/2014  . Allergic rhinitis 09/05/2010  . IBS 09/05/2010  . GERD 02/18/2010  . Barrett's esophagus 02/12/2010  . Upper airway cough syndrome 11/02/2009  . Anxiety state 08/29/2008  . History of colonic polyps 08/29/2008  . PULMONARY SARCOIDOSIS 08/12/2007  . Mild persistent asthma  in adult without complication with component of vcd 08/12/2007  . Diaphragmatic hernia 08/12/2007  . Migraine 08/12/2007    Immunization History  Administered Date(s) Administered  . Fluad Quad(high Dose 65+) 06/30/2019, 07/11/2020  . Influenza Split 07/29/2012  . Influenza Whole 07/09/2002, 07/09/2009, 06/20/2010  . Influenza, High Dose Seasonal PF 06/03/2016, 06/17/2018  . Influenza,inj,Quad PF,6+ Mos 07/11/2013, 06/29/2014  . PFIZER(Purple Top)SARS-COV-2 Vaccination 09/28/2019, 10/19/2019, 07/10/2020  . Pneumococcal Conjugate-13 06/29/2014  . Pneumococcal Polysaccharide-23 07/09/2002, 12/14/2009, 03/04/2016, 07/12/2020  . Td 09/05/2010  . Zoster Recombinat (Shingrix) 06/30/2019    Conditions to be addressed/monitored:  Chronic Respiratory Failure, Hyperlipidemia, Barrett's Esophagus, Osteopenia, Allergic Rhinitis, Chronic Dry Eye  There are no care plans that you recently modified to display for this patient.    Medication Assistance: {MEDASSISTANCEINFO:25044}  Patient's preferred pharmacy is:  [facility], [address], [address] [place], [place] [address] Phone: [phone] Fax: [id number] - [place],  - [address] [place] [address] Phone: [phone] Fax: [phone]  Uses pill box? {Yes or If no, why not?:20788} Pt endorses ***% compliance  We discussed: {Pharmacy options:24294} Patient decided to: {US Pharmacy Plan:23885}  Care Plan and Follow Up Patient Decision:  {FOLLOWUP:24991}  Plan: {CM FOLLOW UP PLAN:25073}  ***  Current Barriers:  . {pharmacybarriers:24917} . ***  Pharmacist Clinical Goal(s):  Marland Kitchen Over the next *** days, patient will {PHARMACYGOALCHOICES:24921} through collaboration with PharmD and provider.  . ***  Interventions: . 1:1 collaboration with Copland, Gay Filler, MD  regarding development and update of comprehensive plan of care as evidenced by provider attestation and co-signature . Inter-disciplinary care team collaboration (see longitudinal plan of care) . Comprehensive medication review performed; medication list updated in electronic medical record  Hyperlipidemia: (LDL goal < ***) -{US controlled/uncontrolled:25276} -Current treatment: . *** -Medications previously tried: ***  -Current dietary patterns: *** -Current exercise  habits: *** -Educated on {CCM HLD Counseling:25126} -{CCMPHARMDINTERVENTION:25122}  Allergic Rhinitis (Goal: ***) -{US controlled/uncontrolled:25276} -Current treatment  . *** -Medications previously tried: ***  -{CCMPHARMDINTERVENTION:25122}   Patient Goals/Self-Care Activities . Over the next *** days, patient will:  - {pharmacypatientgoals:24919}  Follow Up Plan: {CM FOLLOW UP NETU:84039}

## 2020-11-15 ENCOUNTER — Telehealth: Payer: Medicare Other

## 2020-12-05 ENCOUNTER — Telehealth: Payer: Self-pay | Admitting: Pharmacist

## 2020-12-05 NOTE — Progress Notes (Addendum)
    Chronic Care Management Pharmacy Assistant   Name: [name]  MRN: [id number] DOB: June 14, 1951  Reason for Encounter: Adherence Review  Verified Adherence Gap Information. Per insurance data patient has met their colorectal cancer and their wellness bundle screening but they did not met their annual wellness screening. Their most recent A1C was 5.5 on 09/27/2018. The patients total gaps-all measure is equal to 1.   Follow-Up Pharmacist Review  Charlann Lange, [place] Pharmacist Assistant [phone]

## 2021-12-18 ENCOUNTER — Telehealth: Payer: Self-pay

## 2021-12-18 NOTE — Telephone Encounter (Signed)
Letter sent to MyChart and mailed to patient that she is due for CCS/ Cologuard and to reach out to her PCP or our office. ?

## 2022-03-25 ENCOUNTER — Other Ambulatory Visit: Payer: Self-pay

## 2022-03-25 NOTE — Progress Notes (Signed)
Cologuard recall letter (patient due for CCS in 10-2021)
